# Patient Record
Sex: Female | Born: 1935 | Race: White | Hispanic: No | Smoking: Never smoker
Health system: Southern US, Community
[De-identification: ages and names within clinical notes are randomized; demographics above are authoritative.]

## PROBLEM LIST (undated history)

## (undated) DIAGNOSIS — G479 Sleep disorder, unspecified: Secondary | ICD-10-CM

## (undated) DIAGNOSIS — E785 Hyperlipidemia, unspecified: Secondary | ICD-10-CM

## (undated) DIAGNOSIS — F32A Depression, unspecified: Secondary | ICD-10-CM

## (undated) DIAGNOSIS — K219 Gastro-esophageal reflux disease without esophagitis: Secondary | ICD-10-CM

## (undated) DIAGNOSIS — M810 Age-related osteoporosis without current pathological fracture: Secondary | ICD-10-CM

## (undated) DIAGNOSIS — Z950 Presence of cardiac pacemaker: Secondary | ICD-10-CM

## (undated) DIAGNOSIS — M1990 Unspecified osteoarthritis, unspecified site: Secondary | ICD-10-CM

## (undated) DIAGNOSIS — A389 Scarlet fever, uncomplicated: Secondary | ICD-10-CM

## (undated) DIAGNOSIS — I10 Essential (primary) hypertension: Secondary | ICD-10-CM

## (undated) DIAGNOSIS — I4891 Unspecified atrial fibrillation: Secondary | ICD-10-CM

## (undated) DIAGNOSIS — I340 Nonrheumatic mitral (valve) insufficiency: Secondary | ICD-10-CM

## (undated) DIAGNOSIS — E039 Hypothyroidism, unspecified: Secondary | ICD-10-CM

## (undated) DIAGNOSIS — F329 Major depressive disorder, single episode, unspecified: Secondary | ICD-10-CM

## (undated) HISTORY — DX: Hypothyroidism, unspecified: E03.9

## (undated) HISTORY — DX: Scarlet fever, uncomplicated: A38.9

## (undated) HISTORY — DX: Sleep disorder, unspecified: G47.9

## (undated) HISTORY — DX: Unspecified atrial fibrillation: I48.91

## (undated) HISTORY — DX: Nonrheumatic mitral (valve) insufficiency: I34.0

## (undated) HISTORY — DX: Unspecified osteoarthritis, unspecified site: M19.90

## (undated) HISTORY — PX: CATARACT EXTRACTION, BILATERAL: SHX1313

## (undated) HISTORY — DX: Presence of cardiac pacemaker: Z95.0

## (undated) HISTORY — DX: Gastro-esophageal reflux disease without esophagitis: K21.9

## (undated) HISTORY — DX: Depression, unspecified: F32.A

## (undated) HISTORY — DX: Essential (primary) hypertension: I10

## (undated) HISTORY — DX: Major depressive disorder, single episode, unspecified: F32.9

## (undated) HISTORY — DX: Age-related osteoporosis without current pathological fracture: M81.0

## (undated) HISTORY — DX: Hyperlipidemia, unspecified: E78.5

---
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
## 1970-04-19 HISTORY — PX: VESICOVAGINAL FISTULA CLOSURE W/ TAH: SUR271

## 1970-04-19 HISTORY — PX: ABDOMINAL HYSTERECTOMY: SHX81

## 1974-04-19 DIAGNOSIS — C73 Malignant neoplasm of thyroid gland: Secondary | ICD-10-CM

## 1974-04-19 HISTORY — PX: OTHER SURGICAL HISTORY: SHX169

## 1974-04-19 HISTORY — DX: Malignant neoplasm of thyroid gland: C73

## 1981-04-19 HISTORY — PX: BREAST LUMPECTOMY: SHX2

## 2000-04-19 HISTORY — PX: BREAST BIOPSY: SHX20

## 2002-07-30 ENCOUNTER — Encounter: Payer: Self-pay | Admitting: Internal Medicine

## 2005-07-23 ENCOUNTER — Encounter: Payer: Self-pay | Admitting: Internal Medicine

## 2005-08-17 HISTORY — PX: PACEMAKER INSERTION: SHX728

## 2005-08-30 ENCOUNTER — Encounter: Payer: Self-pay | Admitting: Internal Medicine

## 2005-09-09 ENCOUNTER — Encounter: Payer: Self-pay | Admitting: Internal Medicine

## 2008-12-27 ENCOUNTER — Ambulatory Visit: Payer: Self-pay | Admitting: Internal Medicine

## 2008-12-27 DIAGNOSIS — R32 Unspecified urinary incontinence: Secondary | ICD-10-CM

## 2008-12-27 DIAGNOSIS — I10 Essential (primary) hypertension: Secondary | ICD-10-CM | POA: Insufficient documentation

## 2008-12-27 DIAGNOSIS — E039 Hypothyroidism, unspecified: Secondary | ICD-10-CM

## 2008-12-27 DIAGNOSIS — M1990 Unspecified osteoarthritis, unspecified site: Secondary | ICD-10-CM

## 2008-12-27 DIAGNOSIS — M159 Polyosteoarthritis, unspecified: Secondary | ICD-10-CM | POA: Insufficient documentation

## 2008-12-27 DIAGNOSIS — E785 Hyperlipidemia, unspecified: Secondary | ICD-10-CM

## 2008-12-27 DIAGNOSIS — G479 Sleep disorder, unspecified: Secondary | ICD-10-CM

## 2008-12-30 LAB — CONVERTED CEMR LAB
BUN: 26 mg/dL — ABNORMAL HIGH (ref 6–23)
Bilirubin, Direct: 0 mg/dL (ref 0.0–0.3)
CO2: 32 meq/L (ref 19–32)
Calcium: 9.3 mg/dL (ref 8.4–10.5)
Chloride: 101 meq/L (ref 96–112)
Direct LDL: 85.1 mg/dL
Eosinophils Relative: 3.8 % (ref 0.0–5.0)
HCT: 35.1 % — ABNORMAL LOW (ref 36.0–46.0)
HDL: 62.6 mg/dL (ref >39.00)
Lymphs Abs: 1.7 10*3/uL (ref 0.7–4.0)
MCV: 89.3 fL (ref 78.0–100.0)
Monocytes Absolute: 0.7 10*3/uL (ref 0.1–1.0)
Platelets: 230 10*3/uL (ref 150.0–400.0)
Potassium: 3.9 meq/L (ref 3.5–5.1)
RDW: 13.4 % (ref 11.5–14.6)
Total Bilirubin: 0.8 mg/dL (ref 0.3–1.2)
Total CHOL/HDL Ratio: 4
VLDL: 49 mg/dL — ABNORMAL HIGH (ref 0.0–40.0)
WBC: 5.8 10*3/uL (ref 4.5–10.5)

## 2009-01-24 ENCOUNTER — Ambulatory Visit: Payer: Self-pay | Admitting: Internal Medicine

## 2009-02-01 ENCOUNTER — Telehealth: Payer: Self-pay | Admitting: Family Medicine

## 2009-02-01 ENCOUNTER — Ambulatory Visit: Payer: Self-pay | Admitting: Family Medicine

## 2009-02-01 LAB — CONVERTED CEMR LAB
Bilirubin Urine: NEGATIVE
Urobilinogen, UA: 0.2

## 2009-02-07 ENCOUNTER — Ambulatory Visit: Payer: Self-pay | Admitting: Internal Medicine

## 2009-02-07 LAB — CONVERTED CEMR LAB
INR: 1.4
Prothrombin Time: 14.5 s

## 2009-02-10 LAB — CONVERTED CEMR LAB: TSH: 0.64 microintl units/mL (ref 0.35–5.50)

## 2009-02-17 ENCOUNTER — Telehealth (INDEPENDENT_AMBULATORY_CARE_PROVIDER_SITE_OTHER): Payer: Self-pay | Admitting: *Deleted

## 2009-02-17 ENCOUNTER — Ambulatory Visit: Payer: Self-pay | Admitting: Internal Medicine

## 2009-02-17 LAB — CONVERTED CEMR LAB
Bilirubin Urine: NEGATIVE
INR: 1.8
Ketones, urine, test strip: NEGATIVE
Prothrombin Time: 16.4 s
Specific Gravity, Urine: 1.015
Urobilinogen, UA: 0.2
pH: 5

## 2009-02-19 ENCOUNTER — Encounter: Payer: Self-pay | Admitting: Internal Medicine

## 2009-02-25 ENCOUNTER — Telehealth: Payer: Self-pay | Admitting: Internal Medicine

## 2009-03-19 ENCOUNTER — Telehealth: Payer: Self-pay | Admitting: Internal Medicine

## 2009-03-19 ENCOUNTER — Ambulatory Visit: Payer: Self-pay | Admitting: Internal Medicine

## 2009-04-15 ENCOUNTER — Encounter: Payer: Self-pay | Admitting: Internal Medicine

## 2009-04-16 ENCOUNTER — Ambulatory Visit: Payer: Self-pay | Admitting: Internal Medicine

## 2009-04-16 LAB — CONVERTED CEMR LAB
INR: 1.6
Prothrombin Time: 15.3 s

## 2009-04-23 ENCOUNTER — Ambulatory Visit: Payer: Self-pay | Admitting: Internal Medicine

## 2009-04-23 LAB — CONVERTED CEMR LAB: INR: 1.7

## 2009-04-25 ENCOUNTER — Ambulatory Visit: Payer: Self-pay | Admitting: Internal Medicine

## 2009-04-25 ENCOUNTER — Telehealth: Payer: Self-pay | Admitting: Internal Medicine

## 2009-04-25 DIAGNOSIS — H9190 Unspecified hearing loss, unspecified ear: Secondary | ICD-10-CM | POA: Insufficient documentation

## 2009-05-01 ENCOUNTER — Ambulatory Visit: Payer: Self-pay | Admitting: Internal Medicine

## 2009-05-01 LAB — CONVERTED CEMR LAB: INR: 3.2

## 2009-05-05 ENCOUNTER — Ambulatory Visit: Payer: Self-pay | Admitting: Internal Medicine

## 2009-05-05 DIAGNOSIS — F39 Unspecified mood [affective] disorder: Secondary | ICD-10-CM

## 2009-05-06 ENCOUNTER — Telehealth: Payer: Self-pay | Admitting: Internal Medicine

## 2009-05-07 ENCOUNTER — Encounter: Payer: Self-pay | Admitting: Internal Medicine

## 2009-05-15 ENCOUNTER — Ambulatory Visit: Payer: Self-pay | Admitting: Internal Medicine

## 2009-05-15 LAB — CONVERTED CEMR LAB
INR: 2.1
Prothrombin Time: 17.8 s

## 2009-05-28 ENCOUNTER — Ambulatory Visit: Payer: Self-pay | Admitting: Internal Medicine

## 2009-05-28 LAB — CONVERTED CEMR LAB: INR: 1.8

## 2009-06-25 ENCOUNTER — Ambulatory Visit: Payer: Self-pay | Admitting: Internal Medicine

## 2009-06-25 LAB — CONVERTED CEMR LAB: INR: 2.1

## 2009-07-23 ENCOUNTER — Ambulatory Visit: Payer: Self-pay | Admitting: Internal Medicine

## 2009-07-23 DIAGNOSIS — L570 Actinic keratosis: Secondary | ICD-10-CM | POA: Insufficient documentation

## 2009-07-24 LAB — CONVERTED CEMR LAB
ALT: 12 units/L (ref 0–35)
Albumin: 3.8 g/dL (ref 3.5–5.2)
Basophils Relative: 1.5 % (ref 0.0–3.0)
Bilirubin, Direct: 0 mg/dL (ref 0.0–0.3)
CO2: 33 meq/L — ABNORMAL HIGH (ref 19–32)
Calcium: 9 mg/dL (ref 8.4–10.5)
Creatinine, Ser: 1.1 mg/dL (ref 0.4–1.2)
Eosinophils Absolute: 0.3 10*3/uL (ref 0.0–0.7)
Glucose, Bld: 74 mg/dL (ref 70–99)
HCT: 34.1 % — ABNORMAL LOW (ref 36.0–46.0)
Lymphs Abs: 1.7 10*3/uL (ref 0.7–4.0)
MCHC: 34.4 g/dL (ref 30.0–36.0)
MCV: 89.1 fL (ref 78.0–100.0)
Monocytes Absolute: 0.7 10*3/uL (ref 0.1–1.0)
Neutrophils Relative %: 53.1 % (ref 43.0–77.0)
Potassium: 3.8 meq/L (ref 3.5–5.1)
RBC: 3.83 M/uL — ABNORMAL LOW (ref 3.87–5.11)
TSH: 5.57 microintl units/mL — ABNORMAL HIGH (ref 0.35–5.50)
Total Protein: 7.5 g/dL (ref 6.0–8.3)

## 2009-08-20 ENCOUNTER — Ambulatory Visit: Payer: Self-pay | Admitting: Internal Medicine

## 2009-08-20 LAB — CONVERTED CEMR LAB
INR: 2.1
Prothrombin Time: 25.3 s

## 2009-09-17 ENCOUNTER — Ambulatory Visit: Payer: Self-pay | Admitting: Internal Medicine

## 2009-09-17 LAB — CONVERTED CEMR LAB: Prothrombin Time: 31.5 s

## 2009-10-15 ENCOUNTER — Ambulatory Visit: Payer: Self-pay | Admitting: Internal Medicine

## 2009-10-15 LAB — CONVERTED CEMR LAB: INR: 3

## 2009-11-12 ENCOUNTER — Ambulatory Visit: Payer: Self-pay | Admitting: Internal Medicine

## 2009-11-26 ENCOUNTER — Ambulatory Visit: Payer: Self-pay | Admitting: Internal Medicine

## 2009-12-08 ENCOUNTER — Ambulatory Visit: Payer: Self-pay | Admitting: Cardiovascular Disease

## 2009-12-08 DIAGNOSIS — I059 Rheumatic mitral valve disease, unspecified: Secondary | ICD-10-CM | POA: Insufficient documentation

## 2009-12-09 ENCOUNTER — Encounter: Payer: Self-pay | Admitting: Cardiovascular Disease

## 2009-12-10 ENCOUNTER — Encounter: Payer: Self-pay | Admitting: Cardiovascular Disease

## 2009-12-10 ENCOUNTER — Ambulatory Visit: Payer: Self-pay | Admitting: Internal Medicine

## 2009-12-10 LAB — CONVERTED CEMR LAB
INR: 2.8
Prothrombin Time: 33.4 s

## 2009-12-15 ENCOUNTER — Ambulatory Visit: Payer: Self-pay | Admitting: Internal Medicine

## 2009-12-15 DIAGNOSIS — Z950 Presence of cardiac pacemaker: Secondary | ICD-10-CM

## 2009-12-15 DIAGNOSIS — I495 Sick sinus syndrome: Secondary | ICD-10-CM

## 2009-12-16 LAB — CONVERTED CEMR LAB
CO2: 31 meq/L (ref 19–32)
Chloride: 100 meq/L (ref 96–112)
Potassium: 4.1 meq/L (ref 3.5–5.3)
Sodium: 140 meq/L (ref 135–145)
TSH: 5.347 microintl units/mL — ABNORMAL HIGH (ref 0.350–4.500)

## 2009-12-19 ENCOUNTER — Telehealth: Payer: Self-pay | Admitting: Cardiovascular Disease

## 2010-01-19 ENCOUNTER — Telehealth: Payer: Self-pay | Admitting: Internal Medicine

## 2010-01-21 ENCOUNTER — Ambulatory Visit: Payer: Self-pay | Admitting: Internal Medicine

## 2010-01-21 LAB — CONVERTED CEMR LAB
Bilirubin Urine: NEGATIVE
Glucose, Urine, Semiquant: NEGATIVE
INR: 2.3
Ketones, urine, test strip: NEGATIVE
Prothrombin Time: 27.3 s
Specific Gravity, Urine: 1.02
Urobilinogen, UA: 0.2

## 2010-01-28 ENCOUNTER — Ambulatory Visit: Payer: Self-pay | Admitting: Internal Medicine

## 2010-01-28 LAB — CONVERTED CEMR LAB: INR: 2.9

## 2010-02-09 ENCOUNTER — Emergency Department (HOSPITAL_COMMUNITY): Admission: EM | Admit: 2010-02-09 | Discharge: 2010-02-09 | Payer: Self-pay | Admitting: Family Medicine

## 2010-02-09 ENCOUNTER — Encounter: Payer: Self-pay | Admitting: Internal Medicine

## 2010-02-10 ENCOUNTER — Telehealth: Payer: Self-pay | Admitting: Internal Medicine

## 2010-02-10 ENCOUNTER — Ambulatory Visit: Payer: Self-pay | Admitting: Internal Medicine

## 2010-02-10 LAB — CONVERTED CEMR LAB
Bilirubin Urine: NEGATIVE
Glucose, Urine, Semiquant: NEGATIVE
Protein, U semiquant: NEGATIVE
pH: 6

## 2010-02-16 ENCOUNTER — Telehealth: Payer: Self-pay | Admitting: Internal Medicine

## 2010-02-19 ENCOUNTER — Ambulatory Visit: Payer: Self-pay | Admitting: Internal Medicine

## 2010-03-19 ENCOUNTER — Ambulatory Visit: Payer: Self-pay | Admitting: Internal Medicine

## 2010-03-25 ENCOUNTER — Ambulatory Visit: Payer: Self-pay | Admitting: Internal Medicine

## 2010-03-25 LAB — CONVERTED CEMR LAB
INR: 3.6
Prothrombin Time: 42.7 s

## 2010-04-04 ENCOUNTER — Emergency Department (HOSPITAL_COMMUNITY)
Admission: EM | Admit: 2010-04-04 | Discharge: 2010-04-04 | Payer: Self-pay | Source: Home / Self Care | Admitting: Family Medicine

## 2010-04-08 ENCOUNTER — Ambulatory Visit: Payer: Self-pay | Admitting: Internal Medicine

## 2010-04-22 ENCOUNTER — Ambulatory Visit
Admission: RE | Admit: 2010-04-22 | Discharge: 2010-04-22 | Payer: Self-pay | Source: Home / Self Care | Attending: Internal Medicine | Admitting: Internal Medicine

## 2010-04-29 ENCOUNTER — Encounter (INDEPENDENT_AMBULATORY_CARE_PROVIDER_SITE_OTHER): Payer: Self-pay | Admitting: *Deleted

## 2010-05-19 NOTE — Assessment & Plan Note (Signed)
Summary: UTI/DS   Vital Signs:  Patient profile:   77 year old female Height:      63 inches Weight:      133 pounds BMI:     23.65 Temp:     98.2 degrees F oral Pulse rate:   72 / minute Pulse rhythm:   regular BP sitting:   128 / 62  (left arm) Cuff size:   regular  Vitals Entered By: Edwin Dada CMA Deborra Medina) (January 21, 2010 12:39 PM) CC: ? uti   History of Present Illness: Having burniing dysuria Only relief is when it finally ends void started again shortly after ending 3 day Rx for bladder  has used 2 different 3 day regimens Helps but has recurred No fever  Allergies: 1)  ! Sulfa  Review of Systems       mild nausea with the illness Some loose stools with the urine infection appetite is fair  Physical Exam  General:  alert and normal appearance.   Abdomen:  soft and non-tender.   Msk:  No CVA tenderness   Impression & Recommendations:  Problem # 1:  ACUTE CYSTITIS (ICD-595.0) Assessment Comment Only  has had recurrence since trying only the 3 day course Had been given a Rx by urologist for daily prophylaxis in past but she never took it  will treat for 10 days if freq recurrences, consider prophylaxis  Her updated medication list for this problem includes:    Ciprofloxacin Hcl 250 Mg Tabs (Ciprofloxacin hcl) .Marland Kitchen... 1 tab by mouth two times a day for bladder infection  Complete Medication List: 1)  Hydrochlorothiazide 25 Mg Tabs (Hydrochlorothiazide) .... Take 1 by mouth once daily 2)  Sotalol Hcl 120 Mg Tabs (Sotalol hcl) .... Take 1 by mouth two times a day 3)  Lisinopril 40 Mg Tabs (Lisinopril) .... Take 1 by mouth once daily 4)  Levothyroxine Sodium 50 Mcg Tabs (Levothyroxine sodium) .... Take 1 by mouth once daily 5)  Warfarin Sodium 4 Mg Tabs (Warfarin sodium) .Marland Kitchen.. 1-2 daily as  directed 6)  Mirtazapine 15 Mg Tabs (Mirtazapine) .Marland Kitchen.. 1 tab at bedtime 7)  Nitroglycerin 0.4 Mg Subl (Nitroglycerin) .... As needed 8)  Tylenol Extra Strength  500 Mg Tabs (Acetaminophen) .... As needed 9)  Ciprofloxacin Hcl 250 Mg Tabs (Ciprofloxacin hcl) .Marland Kitchen.. 1 tab by mouth two times a day for bladder infection  Patient Instructions: 1)  Please set up repeat protime in 1 week (since on antibioitic) 2)  Please keep scheduled visit in February Prescriptions: CIPROFLOXACIN HCL 250 MG TABS (CIPROFLOXACIN HCL) 1 tab by mouth two times a day for bladder infection  #20 x 1   Entered and Authorized by:   Claris Gower MD   Signed by:   Claris Gower MD on 01/21/2010   Method used:   [facility] to        [facility] [facility]       [address]       [place], [place]  [address]       Ph: [phone] or [phone]       Fax: [phone]   RxID:   [id number]   Current Allergies (reviewed today): ! SULFA  Laboratory Results   Urine Tests  Date/Time Received: January 21, 2010 12:59 PM Date/Time Reported: January 21, 2010 12:59 PM  Routine Urinalysis   Color: yellow Appearance: Clear Glucose: negative   (Normal Range: Negative) Bilirubin: negative   (Normal Range: Negative) Ketone:  negative   (Normal Range: Negative) Spec. Gravity: 1.020   (Normal Range: 1.003-1.035) Blood: small   (Normal Range: Negative) pH: 6.0   (Normal Range: 5.0-8.0) Protein: negative   (Normal Range: Negative) Urobilinogen: 0.2   (Normal Range: 0-1) Nitrite: negative   (Normal Range: Negative) Leukocyte Esterace: trace   (Normal Range: Negative)  Urine Microscopic WBC/HPF: 0-3 RBC/HPF: rare Bacteria/HPF: rare Epithelial/HPF: occ Crystals/HPF: occ       Appended Document: UTI/DS    Clinical Lists Changes  Orders: Added new Service order of Flu Vaccine 65yrs + MEDICARE PATIENTS [id number]) - Signed Added new Service order of Administration Flu vaccine - MCR [id number]) - Signed Observations: Added new observation of FLU VAX VIS: 11/11/2009 version (01/21/2010 13:17) Added new observation of FLU VAXLOT: [id number]  (01/21/2010 13:17) Added new observation of FLU VAXMFR: Glaxosmithkline (01/21/2010 13:17) Added new observation of FLU VAX EXP: 10/17/2010 (01/21/2010 13:17) Added new observation of FLU VAX DSE: 0.10ml (01/21/2010 13:17) Added new observation of FLU VAX: Fluvax 3+ (01/21/2010 13:17)    Flu Vaccine Consent Questions     Do you have a history of severe allergic reactions to this vaccine? no    Any prior history of allergic reactions to egg and/or gelatin? no    Do you have a sensitivity to the preservative Thimersol? no    Do you have a past history of Guillan-Barre Syndrome? no    Do you currently have an acute febrile illness? no    Have you ever had a severe reaction to latex? no    Vaccine information given and explained to patient? yes    Are you currently pregnant? no    Lot Number:[id number]   Exp Date:10/17/2010   Site Given  Left Deltoid IM    .lbmedflu

## 2010-05-19 NOTE — Progress Notes (Signed)
Summary: not any better  Phone Note Call from Patient   Caller: Daughter  [name]  [phone] Summary of Call: Pt was given z-pack last week for URI.   She is not any better- still having shaking, head congestion, swelling in righ eye, productive cough with white mucous.  Low grade fever if any.  Daughter is asking if a different abx can be called to [place].  Please let daughter know. Initial call taken by: Marty Heck CMA, [place],  February 16, 2010 8:46 AM  Follow-up for Phone Call        please send Rx for levaquin 250mg  daily #10 x 0 Have her check protime on thursday or Friday needs to be seen by then if not improved Follow-up by: Claris Gower MD,  February 16, 2010 10:18 AM  Additional Follow-up for Phone Call Additional follow up Details #1::        see other note will increase dose Additional Follow-up by: Claris Gower MD,  February 16, 2010 1:40 PM    New/Updated Medications: LEVOFLOXACIN 500 MG TABS (LEVOFLOXACIN) 1 tab daily for respiratory infection Prescriptions: LEVOFLOXACIN 500 MG TABS (LEVOFLOXACIN) 1 tab daily for respiratory infection  #10 x 0   Entered and Authorized by:   Claris Gower MD   Signed by:   Claris Gower MD on 02/16/2010   Method used:   [facility]        [facility] [facility]       [address]       [place], [place]  [address]       Ph: [phone] or [phone]       Fax: [phone]   RxID:   [id number]

## 2010-05-19 NOTE — Assessment & Plan Note (Signed)
Summary: F/U [facility] 02/09/10/[facility]   Vital Signs:  Patient profile:   77 year old female Weight:      133 pounds O2 Sat:      93 % on Room air Temp:     98.5 degrees F oral Pulse rate:   84 / minute Pulse rhythm:   regular Resp:     18 per minute BP sitting:   100 / 58  (left arm) Cuff size:   regular  Vitals Entered By: Edwin Dada CMA Deborra Medina) (February 10, 2010 12:53 PM)  O2 Flow:  Room air CC: follow-up visit from [facility]   History of Present Illness: Started with resp symptoms 5 days ago (the 20th) had shaking and chills that night some nausea Evaluated by RN at [facility] who felt she may have sinus infection z-pak started on 10/21 did have sinus tenderness did have temp to 100.5  Yesterday  ~5:30PM, had shaking and chills again RN evaluated her then, daughter came over Then spoke to our Call a nurse did go to [facility] urgent care  CXR was fine No Rx changes then  Still with mild facial pain some cough and drainage slight phlegm No SOB  Allergies: 1)  ! Sulfa  Past History:  Past medical, surgical, family and social histories (including risk factors) reviewed for relevance to current acute and chronic problems.  Past Medical History: Reviewed history from 12/08/2009 and no changes required. Atrial fibrillation Hyperlipidemia Hypertension Hypothyroidism--after Rx for thyroid cancer Osteoarthritis Sleep disturbance Urinary incontinence--usually from UTIs Mitral regurgitation Depression pacemaker  Past Surgical History: Reviewed history from 12/27/2008 and no changes required. Tonsils and adenoids 1937 Hysterectomy 1972 Goiter removed--cancerous. Then got RAI  1976 Breast  Lumpectomy-1983 Breast biopsy-- 2002 Atrial fib--3/07 Pacemaker inserted 5/07 (after syncope) Cataracts bilat  Family History: Reviewed history from 12/27/2008 and no changes required. Dad died after broken hip @86  Mom died of old age at 28 5  siblings HTN, DM in mom, brother sister with stroke Brother wtih CAD (CABG) 1 brother with prostate cancer No breast or colon cancer  Social History: Reviewed history from 12/27/2008 and no changes required. Widowed 2002 2 daughters--1 local, other in [place] Was homemaker--farmed/garden Never Smoked Alcohol use-no  Has living will Daughter [name]) is health care POA Discussed DNR and she requests Would not want tube feeds  Review of Systems       slight loose stools appetite is down  Physical Exam  General:  alert.  NAD Head:  no maxillary or sinus tenderness Nose:  mild pale swelling without inflammation Mouth:  no erythema and no exudates.   Neck:  supple, no masses, and no cervical lymphadenopathy.   Lungs:  normal respiratory effort, no intercostal retractions, no accessory muscle use, and normal breath sounds.     Impression & Recommendations:  Problem # 1:  FEVER (ICD-780.60) Assessment New  had chill again last night urine okay still mild sinus symptoms but just finishing z-pak  P: will change to amoxil or augmentin if sinus symptoms or chills return  Orders: UA Dipstick w/o Micro (manual) (81002)  Complete Medication List: 1)  Mirtazapine 15 Mg Tabs (Mirtazapine) .Marland Kitchen.. 1 tab at bedtime 2)  Warfarin Sodium 4 Mg Tabs (Warfarin sodium) .Marland Kitchen.. 1-2 daily as  directed 3)  Hydrochlorothiazide 25 Mg Tabs (Hydrochlorothiazide) .... Take 1 by mouth once daily 4)  Sotalol Hcl 120 Mg Tabs (Sotalol hcl) .... Take 1 by mouth two times a day 5)  Lisinopril  40 Mg Tabs (Lisinopril) .... Take 1 by mouth once daily 6)  Levothyroxine Sodium 50 Mcg Tabs (Levothyroxine sodium) .... Take 1 by mouth once daily 7)  Nitroglycerin 0.4 Mg Subl (Nitroglycerin) .... As needed 8)  Tylenol Extra Strength 500 Mg Tabs (Acetaminophen) .... As needed  Patient Instructions: 1)  Keep regular follow up appt   Orders Added: 1)  Est. Patient Level III [id number] 2)  UA Dipstick  w/o Micro (manual) [81002]    Current Allergies (reviewed today): ! SULFA  Laboratory Results   Urine Tests  Date/Time Received: February 10, 2010 1:04 PM Date/Time Reported: February 10, 2010 1:04 PM  Routine Urinalysis   Appearance: Hazy Glucose: negative   (Normal Range: Negative) Bilirubin: negative   (Normal Range: Negative) Ketone: negative   (Normal Range: Negative) Spec. Gravity: 1.015   (Normal Range: 1.003-1.035) Blood: small   (Normal Range: Negative) pH: 6.0   (Normal Range: 5.0-8.0) Protein: negative   (Normal Range: Negative) Urobilinogen: 0.2   (Normal Range: 0-1) Nitrite: negative   (Normal Range: Negative) Leukocyte Esterace: trace   (Normal Range: Negative)

## 2010-05-19 NOTE — Progress Notes (Signed)
  Phone Note Outgoing Call   Call placed by: Claris Gower MD,  May 06, 2009 8:32 AM Summary of Call: spoke to daughter had packing with sponge done by Dr Pryor Ochoa will have her take just 4mg  of warfarin daily till bleeding is controlled Initial call taken by: Claris Gower MD,  May 06, 2009 8:33 AM

## 2010-05-19 NOTE — Assessment & Plan Note (Signed)
Summary: 6 WK F/U [facility]   Vital Signs:  Patient profile:   77 year old female Weight:      129 pounds Temp:     98.1 degrees F oral Pulse rate:   80 / minute Pulse rhythm:   regular BP sitting:   128 / 70  (left arm) Cuff size:   regular  Vitals Entered By: Edwin Dada CMA Deborra Medina) (July 23, 2009 10:35 AM) CC: 6 week follow-up   History of Present Illness: Here with daughter  Feels better with the mirtazapine Mood is better appetite has improved sleeps well  Now more sedentary  Not much activity now  Ongoing arthritis problems Not too bad---mostly in hands and some in shoulders generally doesn't use tylenol anymore  No heart trouble Occ notes some palpitations--esp when bathing, etc at night No chest pain No SOB  Irritative skin areas on upper chest  Allergies: 1)  ! Sulfa  Past History:  Past medical, surgical, family and social histories (including risk factors) reviewed for relevance to current acute and chronic problems.  Past Medical History: Reviewed history from 05/05/2009 and no changes required. Atrial fibrillation Hyperlipidemia Hypertension Hypothyroidism--after Rx for thyroid cancer Osteoarthritis Sleep disturbance Urinary incontinence--usually from UTIs Mitral regurgitation Depression  Past Surgical History: Reviewed history from 12/27/2008 and no changes required. Tonsils and adenoids 1937 Hysterectomy 1972 Goiter removed--cancerous. Then got RAI  1976 Breast  Lumpectomy-1983 Breast biopsy-- 2002 Atrial fib--3/07 Pacemaker inserted 5/07 (after syncope) Cataracts bilat  Family History: Reviewed history from 12/27/2008 and no changes required. Dad died after broken hip @86  Mom died of old age at 63 5 siblings HTN, DM in mom, brother sister with stroke Brother wtih CAD (CABG) 1 brother with prostate cancer No breast or colon cancer  Social History: Reviewed history from 12/27/2008 and no changes required. 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   Neck:  supple, no masses, no thyromegaly, no cervical lymphadenopathy, and no neck tenderness.   Lungs:  normal respiratory effort and normal breath sounds.   Heart:  normal rate, regular rhythm, no murmur, and no gallop.  Apparent paced rhythm Abdomen:  soft and non-tender.   Msk:  no joint tenderness.  Nodules and thickening in hands without active synovitis Extremities:  no sig edema Skin:  3 actinics on upper chest Psych:  normally interactive, good eye contact, not anxious appearing, and not depressed appearing.     Impression & Recommendations:  Problem # 1:  ATRIAL FIBRILLATION (ICD-427.31) Assessment Comment Only paced  rhthym on coumadin occ palpitations but sounds benign  Her updated medication list for this problem includes:    Sotalol Hcl 120 Mg Tabs (Sotalol hcl) .Marland Kitchen... Take 1 by mouth two times a day    Warfarin Sodium 4 Mg Tabs (Warfarin sodium) .Marland Kitchen... 1-2 daily as  directed  Problem # 2:  HYPERTENSION (ICD-401.9) Assessment: Unchanged  good control no changes needed check labs  Her updated medication list for this problem includes:    Hydrochlorothiazide 25 Mg Tabs (Hydrochlorothiazide) .Marland Kitchen... Take 1 by mouth once daily    Sotalol Hcl 120  Mg Tabs (Sotalol hcl) .Marland Kitchen... Take 1 by mouth two times a day    Lisinopril 40 Mg Tabs (Lisinopril) .Marland Kitchen... Take 1 by mouth once daily  BP today: 128/70 Prior BP: 118/60 (05/05/2009)  Labs Reviewed: K+: 3.9 (12/27/2008) Creat: : 1.1 (12/27/2008)   Chol: 236 (12/27/2008)   HDL: 62.60 (12/27/2008)   TG: 245.0  (12/27/2008)  Orders: TLB-Renal Function Panel (80069-RENAL) TLB-CBC Platelet - w/Differential (85025-CBCD) TLB-Hepatic/Liver Function Pnl (80076-HEPATIC) TLB-TSH (Thyroid Stimulating Hormone) (84443-TSH)  Problem # 3:  DEPRESSION (ICD-311) Assessment: Improved will continue the mirtazapine for mood and sleep  Her updated medication list for this problem includes:    Mirtazapine 15 Mg Tabs (Mirtazapine) .Marland Kitchen... 1 tab at bedtime  Problem # 4:  HYPOTHYROIDISM (ICD-244.9) Assessment: Unchanged  will recheck labs  Her updated medication list for this problem includes:    Levothyroxine Sodium 50 Mcg Tabs (Levothyroxine sodium) .Marland Kitchen... Take 1 by mouth once daily  Labs Reviewed: TSH: 0.64 (02/07/2009)    Chol: 236 (12/27/2008)   HDL: 62.60 (12/27/2008)   TG: 245.0 (12/27/2008)  Orders: TLB-T4 (Thyrox), Free [id number]) Venipuncture [id number])  Problem # 5:  SLEEP DISORDER (ICD-780.50) Assessment: Improved better on mirtazapine  Problem # 6:  ACTINIC KERATOSIS (ICD-702.0) Assessment: New all lesions treated for 40 seconds x 2 Orders: Cryotherapy/Destruction benign or premalignant lesion (1st lesion)  (17000) Cryotherapy/Destruction benign or premalignant lesion (2nd-14th lesions) (17003)  Complete Medication List: 1)  Hydrochlorothiazide 25 Mg Tabs (Hydrochlorothiazide) .... Take 1 by mouth once daily 2)  Nitroglycerin 0.4 Mg Subl (Nitroglycerin) .... As needed 3)  Sotalol Hcl 120 Mg Tabs (Sotalol hcl) .... Take 1 by mouth two times a day 4)  Lisinopril 40 Mg Tabs (Lisinopril) .... Take 1 by mouth once daily 5)  Levothyroxine Sodium 50 Mcg Tabs (Levothyroxine sodium) .... Take 1 by mouth once daily 6)  Warfarin Sodium 4 Mg Tabs (Warfarin sodium) .Marland Kitchen.. 1-2 daily as  directed 7)  Mirtazapine 15 Mg Tabs (Mirtazapine) .Marland Kitchen.. 1 tab at bedtime  Patient Instructions: 1)  Please schedule a follow-up appointment in 4 months .   Current Allergies (reviewed today): ! SULFA

## 2010-05-19 NOTE — Consult Note (Signed)
Summary: [facility]  [facility]   Imported By: Edmonia James 06/13/2009 09:14:18  _____________________________________________________________________  External Attachment:    Type:   Image     Comment:   External Document  Appended Document: [facility] bleeding spot cauterized with silver nitrate follow up as needed

## 2010-05-19 NOTE — Procedures (Signed)
Summary: 9:15 APPT/AMD   Visit Type:  Initial Consult Referring Provider:  Brooke [name]. Primary Provider:  Claris Gower MD  CC:  Est. Care for pacemaker checks.Marland Kitchen  History of Present Illness: [name] is a  77 year old woman seen at the request of Dr Amanda [name] with a previiously implanted pacemaker2007 for syncope and   apparently tachybradycardia syndrome  Her history of paroxysmal atrial fibrillation managed with sotalol.  she has had some weight gain since moving from [place] about 12 months ago. She has mild exercise intolerance but denies chest pain. She has chronic peripheral edema   normal systolic function and moderate to severe MR, mild aortic valve regurgitation, mildly dilated left atrium by echocardiogram in 2007    Holter monitor   April 2007 showed frequent and complex supraventricular arrhythmia, Cardiac catheterization in April 2007 showed  no significant coronary artery disease.cardiac             Current Medications (verified): 1)  Hydrochlorothiazide 25 Mg Tabs (Hydrochlorothiazide) .... Take 1 By Mouth Once Daily 2)  Sotalol Hcl 120 Mg Tabs (Sotalol Hcl) .... Take 1 By Mouth Two Times A Day 3)  Lisinopril 40 Mg Tabs (Lisinopril) .... Take 1 By Mouth Once Daily 4)  Levothyroxine Sodium 50 Mcg Tabs (Levothyroxine Sodium) .... Take 1 By Mouth Once Daily 5)  Warfarin Sodium 4 Mg Tabs (Warfarin Sodium) .Marland Kitchen.. 1-2 Daily As  Directed 6)  Mirtazapine 15 Mg Tabs (Mirtazapine) .Marland Kitchen.. 1 Tab At Bedtime 7)  Nitroglycerin 0.4 Mg Subl (Nitroglycerin) .... As Needed 8)  Tylenol Extra Strength 500 Mg Tabs (Acetaminophen) .... As Needed  Allergies (verified): 1)  ! Sulfa  Past History:  Past Medical History: Last updated: 12/08/2009 Atrial fibrillation Hyperlipidemia Hypertension Hypothyroidism--after Rx for thyroid cancer Osteoarthritis Sleep disturbance Urinary incontinence--usually from UTIs Mitral regurgitation Depression pacemaker  Past Surgical  History: Last updated: 2009/01/18 Tonsils and adenoids 1937 Hysterectomy 1972 Goiter removed--cancerous. Then got RAI  1976 Breast  Lumpectomy-1983 Breast biopsy-- 2002 Atrial fib--3/07 Pacemaker inserted 5/07 (after syncope) Cataracts bilat  Family History: Last updated: 01/18/2009 Dad died after broken hip @86  Mom died of old age at 65 5 siblings HTN, DM in mom, brother sister with stroke Brother wtih CAD (CABG) 1 brother with prostate cancer No breast or colon cancer  Social History: Last updated: 01-18-09 Widowed 2002 2 daughters--1 local, other in [place] Was homemaker--farmed/garden Never Smoked Alcohol use-no  Has living will Daughter [name]) is health care POA Discussed DNR and she requests Would not want tube feeds  Risk Factors: Smoking Status: never (2009-01-18)  Review of Systems       full review of systems was negative apart from a history of present illness and past medical history.   Vital Signs:  Patient profile:   77 year old female Height:      63 inches Weight:      132 pounds BMI:     23.47 Pulse rate:   85 / minute BP sitting:   152 / 89  (left arm) Cuff size:   regular  Vitals Entered By: Dolores Lory, CMA (December 15, 2009 9:07 AM)  Physical Exam  General:  Well developed, well nourishedelderly Caucasian female appearing her stated age of 68, in no acute distress. Head:  normal HEENT upper from a hearing aid Neck:  supple without thyromegaly JVP 6-7 cm carotids brisk without bruits Chest Wall:  mild kyphosis without CVA tenderness Lungs:  clear to auscultate Heart:  regular rate and rhythm without significant murmurs  Abdomen:  soft with active bowel sounds no hepatomegaly or midline pulsation Msk:  normal gait and without obvious arthritis Pulses:  distal pulses intact Extremities:  no clubbing cyanosis or edema Neurologic:  alert and oriented with grossly normal motor and sensory function Skin:  warm and  dry Cervical Nodes:  without adenopathy Psych:  somewhat flat affect   PPM Specifications Following MD:  Virl Axe, MD     PPM Vendor:  [employer]     PPM Model Number:  [id number]     PPM Serial Number:  [id number] PPM DOI:  09/09/2005     PPM Implanting MD:  NOT IMPLANTED BY [place]  Lead 1    Location: RA     DOI: 09/09/2005     Model #: [id number]     Serial #: [id number]     Status: active Lead 2    Location: RV     DOI: 09/09/2005     Model #: [id number]     Serial #[id number]     Status: active  Magnet Response Rate:  BOL 85 ERI 65  Indications:  Syncope   PPM Follow Up Remote Check?  No Battery Voltage:  2.75 V     Battery Est. Longevity:  5.5 years     Pacer Dependent:  No       PPM Device Measurements Atrium  Amplitude: 2.0 mV, Impedance: 711 ohms, Threshold: 0.75 V at 0.4 msec Right Ventricle  Amplitude: 4.0 mV, Impedance: 499 ohms, Threshold: 0.625 V at 0.4 msec  Episodes MS Episodes:  0     Percent Mode Switch:  0     Coumadin:  Yes Ventricular High Rate:  0     Atrial Pacing:  97.8%     Ventricular Pacing:  0.8%  Parameters Next Remote Date:  03/19/2010     Next Cardiology Appt Due:  11/18/2010 Tech Comments:  No parameter changes.  Device function normal.   Carelink transmissions every 3 months.  ROV 1 year with Dr. Caryl Comes in [place]. Alma Friendly, LPN  August 29, [id number] 9:29 AM   Impression & Recommendations:  Problem # 1:  ATRIAL FIBRILLATION (ICD-427.31) She has paroxysmal atrial fibrillation. A cardiogram from last week was reviewed with a QTC of 440 ms. Blood work from April had a potassium of 3.8; no magnesium level was drawn. We'll repeat these today. Interrogation of her device demonstrates no intercurrent atrial fibrillation Her updated medication list for this problem includes:    Sotalol Hcl 120 Mg Tabs (Sotalol hcl) .Marland Kitchen... Take 1 by mouth two times a day    Warfarin Sodium 4 Mg Tabs (Warfarin sodium) .Marland Kitchen... 1-2 daily as  directed  Problem # 2:  HYPERTENSION  (ICD-401.9) blood pressure is mildly elevated today. This appears to be anomalous per her daughter. And to continue to follow without making any medication adjustments Her updated medication list for this problem includes:    Hydrochlorothiazide 25 Mg Tabs (Hydrochlorothiazide) .Marland Kitchen... Take 1 by mouth once daily    Sotalol Hcl 120 Mg Tabs (Sotalol hcl) .Marland Kitchen... Take 1 by mouth two times a day    Lisinopril 40 Mg Tabs (Lisinopril) .Marland Kitchen... Take 1 by mouth once daily  Problem # 3:  SICK SINUS/ TACHY-BRADY SYNDROME (ICD-427.81) patient is largely atrially paced. Her heart rate distribution was a little bit skewed and her  rate response was reprogrammed Her updated medication list for this problem includes:    Sotalol Hcl 120 Mg Tabs (Sotalol hcl) .Marland Kitchen... Take 1 by  mouth two times a day    Lisinopril 40 Mg Tabs (Lisinopril) .Marland Kitchen... Take 1 by mouth once daily    Warfarin Sodium 4 Mg Tabs (Warfarin sodium) .Marland Kitchen... 1-2 daily as  directed    Nitroglycerin 0.4 Mg Subl (Nitroglycerin) .Marland Kitchen... As needed  Problem # 4:  PACEMAKER, PERMANENT (ICD-V45.01)  Device parameters and data were reviewed and changes were made as above  Orders: T-Basic Metabolic Panel ([id number])  Problem # 5:  HYPOTHYROIDISM (ICD-244.9) her TSH in April was greater than 5.5. We will recheck it today. Her updated medication list for this problem includes:    Levothyroxine Sodium 50 Mcg Tabs (Levothyroxine sodium) .Marland Kitchen... Take 1 by mouth once daily  Other Orders: T-Magnesium [id number]) T-TSH [phone])  Patient Instructions: 1)  Your physician recommends that you continue on your current medications as directed. Please refer to the Current Medication list given to you today. 2)  Your physician wants you to follow-up in:   1 year You will receive a reminder letter in the mail two months in advance. If you don't receive a letter, please call our office to schedule the follow-up appointment. 3)  Your physician recommends that you have   lab work today: (Mag/TSH/BMET)

## 2010-05-19 NOTE — Progress Notes (Signed)
Summary: Call from [facility]  Phone Note From Other Clinic Call back at [phone]   Caller: [facility] Call For: Dr. Silvio Pate Summary of Call: Amanda [name] was called out to see this patient today. Patient is extremely weak, some shaking, unsteady gait when she walks, BP 88/42, standing 79/40. Patient is alert and responsive, but has been having ongoing periods of productive cough, large amount of sputum, dizziness and soreness in her chest today, O2 stat 87-90% on rest and room air, and she hears a rub in her left posterior based lung. Patient's right eye is red, crusting and matter in the corner of the eye. Patient has a poor appetite, may have a lung infection that has settled in.  Patient's family thinks that she may need an antibiotic or to be seen. Please advise. Initial call taken by: Emelia Salisbury LPN,  October 31, 624THL 1:17 PM  Follow-up for Phone Call        levaquin prescribed already spoke to daughter--she will get the first dose in ASAP  Amanda [name] will have her hold the lisinopril and HCTZ for now plan to see later this week--daughter will call  To ER tonight unless she is improving after the antibiotic  Claris Gower MD  February 16, 2010 1:44 PM  Follow-up by: Claris Gower MD,  February 16, 2010 1:44 PM  Additional Follow-up for Phone Call Additional follow up Details #1::        Dee,  Please call daughter and Amanda [name] to check on her in the morning Claris Gower MD  February 16, 2010 1:44 PM   [facility] called and stated pt lungs sound clear, BP better up to 118/60 O2 above 90%, pt is weak but better than yesterday, daughter is pushing fluids and pt started medication yesterday. Per Amanda Ro do you want to see pt today or later in the week? Please advise. [name] CMA Deborra Medina)  February 17, 2010 8:54 AM   should set up appt later this week with me or at clinic tomorrow with Dr Gwenevere Ghazi Horton Marshall MD  February 17, 2010 9:51 AM   Appt scheduled Thursday  02/19/10 @ 3:00pm Additional Follow-up by: Edwin Dada CMA ([place]),  February 17, 2010 10:04 AM

## 2010-05-19 NOTE — Consult Note (Signed)
Summary: [facility]  [facility]   Imported By: Laural Benes 05/19/2009 15:56:42  _____________________________________________________________________  External Attachment:    Type:   Image     Comment:   External Document  Appended Document: [facility] treated bleeding in left ear canal after my cerumen removal

## 2010-05-19 NOTE — Assessment & Plan Note (Signed)
Summary: 4MTH FOLLOW UP / LFW   Vital Signs:  Patient profile:   77 year old female Weight:      131.25 pounds BMI:     23.33 Temp:     98.3 degrees F oral Pulse rate:   80 / minute Pulse rhythm:   regular BP sitting:   124 / 78  (left arm) Cuff size:   regular  Vitals Entered By: Emelia Salisbury LPN (August 10, 624THL 10:55 AM) CC: 4 Month follow-up   History of Present Illness: Here with daughter as usual  has been feeling a little dizzy for the past 2-3 days No nausea Slight loose stools No fever No abd pain appetite is off some  Appetite has been okay  weight up a couple of pounds  Some trouble sleeping Mind goes --even after relaxing with reading Usually goes to sleep by 1:30 or 2 sleeps till 7:30AM Naps during the day easily--just goes for a few minutes She did stop the mirtazapine  Allergies: 1)  ! Sulfa  Past History:  Past medical, surgical, family and social histories (including risk factors) reviewed for relevance to current acute and chronic problems.  Past Medical History: Reviewed history from 05/05/2009 and no changes required. Atrial fibrillation Hyperlipidemia Hypertension Hypothyroidism--after Rx for thyroid cancer Osteoarthritis Sleep disturbance Urinary incontinence--usually from UTIs Mitral regurgitation Depression  Past Surgical History: Reviewed history from 12/27/2008 and no changes required. Tonsils and adenoids 1937 Hysterectomy 1972 Goiter removed--cancerous. Then got RAI  1976 Breast  Lumpectomy-1983 Breast biopsy-- 2002 Atrial fib--3/07 Pacemaker inserted 5/07 (after syncope) Cataracts bilat  Family History: Reviewed history from 12/27/2008 and no changes required. Dad died after broken hip @86  Mom died of old age at 103 5 siblings HTN, DM in mom, brother sister with stroke Brother wtih CAD (CABG) 1 brother with prostate cancer No breast or colon cancer  Social History: Reviewed history from 12/27/2008 and no  changes required. 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         no mood problems Mild evening edema  Physical Exam  General:  alert and normal appearance.   Neck:  supple, no masses, no thyromegaly, no carotid bruits, and no cervical lymphadenopathy.   Lungs:  normal respiratory effort, no intercostal retractions, no accessory muscle use, and normal breath sounds.   Heart:  normal rate, regular rhythm, no murmur, and no gallop.   Abdomen:  soft, non-tender, and no masses.   Msk:  no joint tenderness and no joint swelling.   Pulses:  1+ in feet Extremities:  no edema Psych:  normally interactive, good eye contact, not anxious appearing, and not depressed appearing.     Impression & Recommendations:  Problem # 1:  GASTROENTERITIS (ICD-558.9) Assessment New mild should be self limited  Problem # 2:  DEPRESSION (ICD-311) Assessment: Improved stopped the mirtazapine having some sleep problems she will decide if she wants to restart  Her updated medication list for this problem includes:    Mirtazapine 15 Mg Tabs (Mirtazapine) .Marland Kitchen... 1 tab at bedtime  Problem # 3:  ATRIAL FIBRILLATION (ICD-427.31) Assessment: Unchanged  in sinus on meds wants to switch to local cardiology care  Her updated medication list for this problem includes:    Sotalol Hcl 120  Mg Tabs (Sotalol hcl) .Marland Kitchen... Take 1 by mouth two times a day    Warfarin Sodium 4 Mg Tabs (Warfarin sodium) .Marland Kitchen... 1-2 daily as  directed  Orders: Cardiology Referral (Cardiology)  Problem # 4:  HYPERTENSION (ICD-401.9) Assessment: Unchanged good control no changes needed  Her updated medication list for this problem includes:     Hydrochlorothiazide 25 Mg Tabs (Hydrochlorothiazide) .Marland Kitchen... Take 1 by mouth once daily    Sotalol Hcl 120 Mg Tabs (Sotalol hcl) .Marland Kitchen... Take 1 by mouth two times a day    Lisinopril 40 Mg Tabs (Lisinopril) .Marland Kitchen... Take 1 by mouth once daily  BP today: 124/78 Prior BP: 128/70 (07/23/2009)  Labs Reviewed: K+: 3.8 (07/23/2009) Creat: : 1.1 (07/23/2009)   Chol: 236 (12/27/2008)   HDL: 62.60 (12/27/2008)   TG: 245.0 (12/27/2008)  Problem # 5:  SLEEP DISORDER (ICD-780.50) Assessment: Deteriorated she will consider restarting the mirtazapine  Complete Medication List: 1)  Hydrochlorothiazide 25 Mg Tabs (Hydrochlorothiazide) .... Take 1 by mouth once daily 2)  Sotalol Hcl 120 Mg Tabs (Sotalol hcl) .... Take 1 by mouth two times a day 3)  Lisinopril 40 Mg Tabs (Lisinopril) .... Take 1 by mouth once daily 4)  Levothyroxine Sodium 50 Mcg Tabs (Levothyroxine sodium) .... Take 1 by mouth once daily 5)  Warfarin Sodium 4 Mg Tabs (Warfarin sodium) .Marland Kitchen.. 1-2 daily as  directed 6)  Mirtazapine 15 Mg Tabs (Mirtazapine) .Marland Kitchen.. 1 tab at bedtime 7)  Nitroglycerin 0.4 Mg Subl (Nitroglycerin) .... As needed  Patient Instructions: 1)  If you continue to have trouble sleeping, please restart the mirtazapine at bedtime 2)  Please schedule a follow-up appointment in 6 months .  3)  Referral Appointment Information 4)  Day/Date: 5)  Time: 6)  Place/MD: 7)  Address: 8)  Phone/Fax: 9)  Patient given appointment information. Information/Orders faxed/mailed.  Current Allergies (reviewed today): ! SULFA

## 2010-05-19 NOTE — Assessment & Plan Note (Signed)
Summary: NP6/AMD   Visit Type:  Initial Consult Referring Tarrah Furuta:  Brooke Pace. Primary Mckinzi Eriksen:  Claris Gower MD  CC:  Establish care with a cardiologist A-fib and pacermaker implanted 2007.  She moved to [facility]  Aug. 2010 to be closer to family. She lives at [facility]. She has her pacemaker check by phone with [facility]. Marland Kitchen  History of Present Illness: [name] is a pleasant 77 year old woman who presents to establish care. She has a history of paroxysmal atrial fibrillation, normal systolic function by echocardiogram in 2007 with moderate to severe MR, mild aortic valve regurgitation, mildly dilated left atrium, Holter monitor and April 2007 showing frequent and complex supraventricular arrhythmia, cardiac CTA showing no significant coronary artery disease with atherosclerotic plaque in the thoracic aorta who has been managed on sotalol for rhythm control, pacer placed in May 2007 with a history of "fainting "prior to that who presents to establish care.  She presents with her daughter. Overall, she states that she has been doing well. The car unaware if she has had any recent episodes of atrial fibrillation. She is relatively sedentary, does walk for short periods during the daytime but likes to read. She denies any shortness of breath, chest pain. Occasionally she has waxing and waning lower extremity edema. She has no complaints.  EKG shows normal sinus rhythm with rate 65 beats per minute, appears to be a sensed  With magnet, rate is 85 beats per minute, A- V. paced  Cardiac catheterization in April 2007 showing no significant coronary artery disease.  Current Medications (verified): 1)  Hydrochlorothiazide 25 Mg Tabs (Hydrochlorothiazide) .... Take 1 By Mouth Once Daily 2)  Sotalol Hcl 120 Mg Tabs (Sotalol Hcl) .... Take 1 By Mouth Two Times A Day 3)  Lisinopril 40 Mg Tabs (Lisinopril) .... Take 1 By Mouth Once Daily 4)  Levothyroxine Sodium 50 Mcg Tabs  (Levothyroxine Sodium) .... Take 1 By Mouth Once Daily 5)  Warfarin Sodium 4 Mg Tabs (Warfarin Sodium) .Marland Kitchen.. 1-2 Daily As  Directed 6)  Mirtazapine 15 Mg Tabs (Mirtazapine) .Marland Kitchen.. 1 Tab At Bedtime 7)  Nitroglycerin 0.4 Mg Subl (Nitroglycerin) .... As Needed 8)  Tylenol Extra Strength 500 Mg Tabs (Acetaminophen) .... As Needed  Allergies (verified): 1)  ! Sulfa  Past History:  Past Surgical History: Last updated: 01/01/2009 Tonsils and adenoids 1937 Hysterectomy 1972 Goiter removed--cancerous. 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 Smoked Alcohol use-no  Has living will Daughter [name]) is health care POA Discussed DNR and she requests Would not want tube feeds  Risk Factors: Smoking Status: never (2009-01-01)  Past Medical History: Atrial fibrillation Hyperlipidemia Hypertension Hypothyroidism--after Rx for thyroid cancer Osteoarthritis Sleep disturbance Urinary incontinence--usually from UTIs Mitral regurgitation Depression pacemaker  Review of Systems  The patient denies fever, weight loss, weight gain, vision loss, decreased hearing, hoarseness, chest pain, syncope, dyspnea on exertion, peripheral edema, prolonged cough, abdominal pain, incontinence, muscle weakness, depression, and enlarged lymph nodes.    Vital Signs:  Patient profile:   77 year old female Height:  63 inches Weight:      134 pounds BMI:     23.82 Pulse rate:   68 / minute BP sitting:   136 / 81  (left arm) Cuff  size:   regular  Vitals Entered By: Dolores Lory, CMA (December 08, 2009 9:39 AM)  Physical Exam  General:  Well developed, well nourished, in no acute distress. Head:  normocephalic and atraumatic Neck:  Neck supple, no JVD. No masses, thyromegaly or abnormal cervical nodes. Lungs:  Clear bilaterally to auscultation and percussion. Heart:  Non-displaced PMI, chest non-tender; regular rate and rhythm, S1, S2 without murmurs, rubs or gallops. Carotid upstroke normal, no bruit.  Pedals normal pulses. Trace edema, no varicosities. Abdomen:   abdomen soft and non-tender without masses Msk:  Back normal, normal gait. Muscle strength and tone normal. Pulses:  pulses normal in all 4 extremities Extremities:  No clubbing or cyanosis. Neurologic:  Alert and oriented x 3.   Impression & Recommendations:  Problem # 1:  ATRIAL FIBRILLATION (ICD-427.31) history of paroxysmal atrial fibrillation. She is asymptomatic. She has a pacemaker for possible sick sinus syndrome. We'll try to obtain the records for our notes from [facility] We will set her up for an evaluation by Dr. Caryl Comes for routine pacemaker checks  Her updated medication list for this problem includes:    Sotalol Hcl 120 Mg Tabs (Sotalol hcl) .Marland Kitchen... Take 1 by mouth two times a day    Warfarin Sodium 4 Mg Tabs (Warfarin sodium) .Marland Kitchen... 1-2 daily as  directed  Problem # 2:  HYPERLIPIDEMIA (ICD-272.4) We have talked to her about her cholesterol. Her daughter states that her weight is going up, she is poorly and not exercising enough. We have suggested that she watch her diet for several months, recheck her cholesterol later in the year. If it continues to be elevated, we could start low dose Lipitor or generic equivalent.  Problem # 3:  HYPERTENSION (ICD-401.9) blood pressure is well-controlled on today's visit. No medication changes made at this time  Her updated medication list for this problem includes:    Hydrochlorothiazide 25 Mg Tabs  (Hydrochlorothiazide) .Marland Kitchen... Take 1 by mouth once daily    Sotalol Hcl 120 Mg Tabs (Sotalol hcl) .Marland Kitchen... Take 1 by mouth two times a day    Lisinopril 40 Mg Tabs (Lisinopril) .Marland Kitchen... Take 1 by mouth once daily  Problem # 4:  MITRAL VALVE DISORDERS (ICD-424.0) history of significant MR. She is relatively asymptomatic and this has been managed medically for the past 4 years. No further evaluation at this time unless she becomes more symptomatic with shortness of breath, worsening lower extremity edema.  Her updated medication list for this problem includes:    Hydrochlorothiazide 25 Mg Tabs (Hydrochlorothiazide) .Marland Kitchen... Take 1 by mouth once daily    Sotalol Hcl 120 Mg Tabs (Sotalol hcl) .Marland Kitchen... Take 1 by mouth two times a day    Lisinopril 40 Mg Tabs (Lisinopril) .Marland Kitchen... Take 1 by mouth once daily    Nitroglycerin 0.4 Mg Subl (Nitroglycerin) .Marland Kitchen... As needed  Patient Instructions: 1)  Your physician recommends that you schedule a follow-up appointment in: ASAP with Dr. Caryl Comes 2)  Your physician recommends that you continue on your current medications as directed. Please refer to the Current Medication list given to you today. 3)  Your physician wants you to follow-up in:  1 year You will receive a reminder letter in the mail two months in advance. If you don't receive a letter, please call our  office to schedule the follow-up appointment. 4)  Your physician recommends a low cholesterol, low fat diet. 5)  Your physician discussed the importance of regular exercise and recommended that you start or continue a regular exercise program for good health.

## 2010-05-19 NOTE — Progress Notes (Signed)
Summary: call a nurse   Phone Note Call from Patient   Summary of Call: Triage Record Num: [id number] Operator: Nancie Neas Patient Name: Amanda [name] Call Date & Time: 02/09/2010 6:13:48PM Patient Phone: [phone] PCP: Viviana Simpler Patient Gender: Female PCP Fax : [phone] Patient DOB: [birth year] Practice Name: [facility] Reason for Call: Daughter./ [name] calling. Pt lives at [facility] and was prescribed Zpak 02/06/2010 by [facility] facilty provider for with sinus infection -- -. Today, 02/09/2010, daughter was told she has had shaking uncontrollably with bad cough. Nurse came to evaluate pt at [facility] appt , and was told to be seen at [facility] clinic tonight. Daughter is wanting to know if office does have walkin clinic. RN adv that office is closed and can be seen as walk in at [facility] urgent care clinic , or [facility].Rn offered to call and triage pt , and daughter declined stated she is going to stay with pt and will call back if needed. Protocol(s) Used: Office Note Recommended Outcome per Protocol: Information Noted and Sent to Office Reason for Outcome: Caller information to office Care Advice:  ~ Initial call taken by: Lacretia Nicks,  February 10, 2010 8:34 AM  Follow-up for Phone Call        Please call and check on her Claris Gower MD  February 10, 2010 8:36 AM   pt scheduled appt today at 12:15pm Follow-up by: Edwin Dada CMA Deborra Medina),  February 10, 2010 9:41 AM

## 2010-05-19 NOTE — Letter (Signed)
Summary: [employer]   Imported By: Zenovia Jarred 12/09/2009 14:57:29  _____________________________________________________________________  External Attachment:    Type:   Image     Comment:   External Document

## 2010-05-19 NOTE — Letter (Signed)
Summary: PHI  PHI   Imported By: Zenovia Jarred 12/09/2009 14:58:00  _____________________________________________________________________  External Attachment:    Type:   Image     Comment:   External Document

## 2010-05-19 NOTE — Progress Notes (Signed)
Summary: RESULTS  Phone Note Call from Patient Call back at [phone]   Caller: [name]) Call For: Amanda [name] Summary of Call: Amanda [name] THE PT'S LAB RESULTS Initial call taken by: Zenovia Jarred,  December 19, 2009 1:50 PM  Follow-up for Phone Call        PT'S DAUGHTER WOULD [place] [id number] Follow-up by: Zenovia Jarred,  December 24, 2009 2:07 PM  Additional Follow-up for Phone Call Additional follow up Details #1::        notified daughter report has not but signed off by Dr. Caryl [name] and if not by tomorrow, we will have another physician review to get some answers on what to do about medications. Additional Follow-up by: Dolores Lory, [place],  December 24, 2009 5:11 PM    Additional Follow-up for Phone Call Additional follow up Details #2::    Notified [name] (daughter) regarding TSH and told slight elevation..Dr. Rockey Situ does not see any need to change dose of thyroid medication but to contact Dr. Silvio Pate on Monday to see if he wants to change the thyroid medication. Follow-up by: Dolores Lory, CMA,  December 26, 2009 5:09 PM

## 2010-05-19 NOTE — Progress Notes (Signed)
Summary: Hearing Test.  Phone Note Call from Patient   Caller: Daughter Call For: Claris Gower MD Summary of Call: Pts daughter came to the office on Friday, Jan 7th . Would like a hearing test performed for pt.   daughters name Aliene Beams- call back # [phone] Initial call taken by: Allen Wiler,  April 25, 2009 11:45 AM  Follow-up for Phone Call        We can set up nurse visit for hearing screen with the hand held audiometer If sig problems, can make referral to ENT Follow-up by: Claris Gower MD,  April 25, 2009 8:02 PM  Additional Follow-up for Phone Call Additional follow up Details #1::        pt's daughter states that pt has a hearing aid and daughter doesn't think it's the right one for her mother, daughter states they see a audiologist and she doesn't trust him and the audiologist doesn't sell the type of hearing aid her mother wears, daughter doesn't think an ENT will help, but she will take her mother to one if that what Dr. Silvio Pate recommend. Also daughter wanted to know why we couldn't do her mothers hearing test here. I advised that we don't do hearing tests to that extent. Please advise DeShannon Tamala Julian CMA [facility])  April 28, 2009 9:58 AM   we have only very basic hearing tests here. I think a second opinion at ENT makes sense---they will have their audiologist check as well Additional Follow-up by: Claris Gower MD,  April 28, 2009 10:16 AM  New Problems: HEARING LOSS (ICD-389.9)   Additional Follow-up for Phone Call Additional follow up Details #2::    Patient was seen by Audiologist in [facility], no longer needs referral for audiology, bur due to ear bleed from Cerumen cleaning will get an ASAP appt with ENT per Dr Silvio Pate. Follow-up by: Haynes Bast,  May 05, 2009 2:40 PM  New Problems: HEARING LOSS (ICD-389.9)

## 2010-05-19 NOTE — Op Note (Signed)
Summary: Pacemaker  Pacemaker   Imported By: Zenovia Jarred 12/31/2009 12:22:38  _____________________________________________________________________  External Attachment:    Type:   Image     Comment:   External Document

## 2010-05-19 NOTE — Letter (Signed)
Summary: Medical Record Release  Medical Record Release   Imported By: Zenovia Jarred 12/10/2009 16:34:34  _____________________________________________________________________  External Attachment:    Type:   Image     Comment:   External Document

## 2010-05-19 NOTE — Progress Notes (Signed)
Summary: Elevated TSH  Phone Note Call from Patient Call back at [phone]   Caller: Daughter/[name] Call For: Amanda Gower MD Summary of Call: Calling because her mother takes Synthroid and her TSH level tested high when she saw the heart doctor at [facility], it was at 5.3. Patient has had 2 flare ups with bladder infections recently and has taken all of her antibiotics. Patient is not having any UTI symptoms at this time.  Daughter states that the patient has an appt. to check her PT here on 01/21/10 and wants to know if she should see you when she comes in at that time?  [facility] Initial call taken by: Emelia Salisbury LPN,  October  3, 624THL 1:41 PM  Follow-up for Phone Call        5.3 is very borderline and is not likely to indicate a clinically important underactive thyroid There are risks with taking thyroid hormone as well  Please check a free T4 when she is here for protime Only needs Rx if this is low Follow-up by: Amanda Gower MD,  January 19, 2010 2:00 PM  Additional Follow-up for Phone Call Additional follow up Details #1::        spoke with daughter and advised results, pt's daughter would like a refill of the abx you prescribed in 02/2009 for UTI. Daughter states that her mother has had 2 "flare up's" of UTI? I advised daughter that her mother may need to be seen but I would ask for refill. I also changed lab order to ask for free T4. [name] CMA [facility] Medina)  January 19, 2010 3:45 PM   Yes, she would need to be seen to assess her clinical status Amanda Gower MD  January 19, 2010 3:48 PM   pt's daughter scheduled for 01/21/2010 Additional Follow-up by: Edwin Dada CMA Deborra Medina),  January 19, 2010 4:56 PM

## 2010-05-19 NOTE — Assessment & Plan Note (Signed)
Summary: FOLLOW-UP/DS   Vital Signs:  Patient profile:   77 year old female Weight:      134 pounds Temp:     98.5 degrees F oral Pulse rate:   64 / minute Pulse rhythm:   regular Resp:     20 per minute BP sitting:   128 / 70  (left arm) Cuff size:   regular  Vitals Entered By: Edwin Dada CMA Deborra Medina) (February 19, 2010 3:13 PM) CC: follow-up visit   History of Present Illness: started antibiotic (levaquin) on Monday Today she finally is starting to feel some better  still a little cough Very little sputum Not short of breath now  No body aches today  No fever No night sweats or chills since the day before the levaquin  having some eye redness on right some change in vision no sig discharge has been trying murine  Allergies: 1)  ! Sulfa  Past History:  Past medical, surgical, family and social histories (including risk factors) reviewed for relevance to current acute and chronic problems.  Past Medical History: Reviewed history from 12/08/2009 and no changes required. Atrial fibrillation Hyperlipidemia Hypertension Hypothyroidism--after Rx for thyroid cancer Osteoarthritis Sleep disturbance Urinary incontinence--usually from UTIs Mitral regurgitation Depression pacemaker  Past Surgical History: Reviewed history from 12/27/2008 and no changes required. Tonsils and adenoids 1937 Hysterectomy 1972 Goiter removed--cancerous. Then got RAI  1976 Breast  Lumpectomy-1983 Breast biopsy-- 2002 Atrial fib--3/07 Pacemaker inserted 5/07 (after syncope) Cataracts bilat  Family History: Reviewed history from 12/27/2008 and no changes required. Dad died after broken hip @86  Mom died of old age at 24 5 siblings HTN, DM in mom, brother sister with stroke Brother wtih CAD (CABG) 1 brother with prostate cancer No breast or colon cancer  Social History: Reviewed history from 12/27/2008 and no changes required. Widowed 2002 2 daughters--1 local, other in  [place] Was homemaker--farmed/garden Never Smoked Alcohol use-no  Has living will Daughter [name]) is health care POA Discussed DNR and she requests Would not want tube feeds  Review of Systems       appetite is some better now No diarrhea or vomiting  Physical Exam  General:  alert.  NAD Eyes:  mild right tarsal and bulbar injeciton left eye spared Neck:  supple, no masses, and no cervical lymphadenopathy.   Lungs:  normal respiratory effort, no intercostal retractions, no accessory muscle use, no dullness, no crackles, and no wheezes.  Breath sounds slightly decreased at right base   Impression & Recommendations:  Problem # 1:  FEVER (ICD-780.60) Assessment Improved seems to have had a respiratory infection that is better now Will finish out levaquin check CXR if has any persistent symptoms  Problem # 2:  CONJUNCTIVITIS (ICD-372.30) Assessment: New  will treat with drops Due for eye exam--daughter will set up with [facility]  Her updated medication list for this problem includes:    Ciprofloxacin Hcl 0.3 % Soln (Ciprofloxacin hcl) .Marland Kitchen... 2 drops in each eye four times daily for 5 days  Complete Medication List: 1)  Mirtazapine 15 Mg Tabs (Mirtazapine) .Marland Kitchen.. 1 tab at bedtime 2)  Warfarin Sodium 4 Mg Tabs (Warfarin sodium) .Marland Kitchen.. 1-2 daily as  directed 3)  Hydrochlorothiazide 25 Mg Tabs (Hydrochlorothiazide) .... Take 1 by mouth once daily 4)  Sotalol Hcl 120 Mg Tabs (Sotalol hcl) .... Take 1 by mouth two times a day 5)  Lisinopril 40 Mg Tabs (Lisinopril) .... Take 1 by mouth once daily 6)  Levothyroxine Sodium 50 Mcg  Tabs (Levothyroxine sodium) .... Take 1 by mouth once daily 7)  Nitroglycerin 0.4 Mg Subl (Nitroglycerin) .... As needed 8)  Tylenol Extra Strength 500 Mg Tabs (Acetaminophen) .... As needed 9)  Levofloxacin 500 Mg Tabs (Levofloxacin) .Marland Kitchen.. 1 tab daily for respiratory infection 10)  Ciprofloxacin Hcl 0.3 % Soln (Ciprofloxacin hcl) .... 2 drops in  each eye four times daily for 5 days  Patient Instructions: 1)  Please keep February follow up appt Prescriptions: CIPROFLOXACIN HCL 0.3 % SOLN (CIPROFLOXACIN HCL) 2 drops in each eye four times daily for 5 days  #1 bottle x 0   Entered and Authorized by:   Claris Gower MD   Signed by:   Claris Gower MD on 02/19/2010   Method used:   [facility]        [facility] [place]       [address]       [place], [place]  [address]       Ph: [phone] or [phone]       Fax: [phone]   RxID:   [id number]    Orders Added: 1)  Est. Patient Level III [id number]    Current Allergies (reviewed today): ! SULFA  Appended Document: FOLLOW-UP/DS  Laboratory Results   Blood Tests   Date/Time Recieved: February 19, 2010 4:14 PM  Date/Time Reported: February 19, 2010 4:14 PM   PT: 31.2 s   (Normal Range: 10.6-13.4)  INR: 2.6   (Normal Range: 0.88-1.12   Therap INR: 2.0-3.5)      ANTICOAGULATION RECORD PREVIOUS REGIMEN & LAB RESULTS Anticoagulation Diagnosis:  Atrial fibrillation on  04/25/2009 Previous INR Goal Range:  2.0-3.0 on  04/25/2009 Previous INR:  2.9 on  01/28/2010 Previous Coumadin Dose(mg):  4mg  daily on  01/28/2010 Previous Regimen:  4mg  daily on  01/28/2010 Previous Coagulation Comments:  . on  07/23/2009  NEW REGIMEN & LAB RESULTS Current INR: 2.6 Regimen: 4mg  daily  (no change)  Yaslene Lindamood: letvak      Repeat testing in: 4 weeks MEDICATIONS MIRTAZAPINE 15 MG TABS (MIRTAZAPINE) 1 tab at bedtime WARFARIN SODIUM 4 MG TABS (WARFARIN SODIUM) 1-2 daily as  directed HYDROCHLOROTHIAZIDE 25 MG TABS (HYDROCHLOROTHIAZIDE) take 1 by mouth once daily SOTALOL HCL 120 MG TABS (SOTALOL HCL) take 1 by mouth two times a day LISINOPRIL 40 MG TABS (LISINOPRIL) take 1 by mouth once daily LEVOTHYROXINE SODIUM 50 MCG TABS (LEVOTHYROXINE SODIUM) take 1 by mouth once daily NITROGLYCERIN 0.4 MG SUBL (NITROGLYCERIN) as needed TYLENOL  EXTRA STRENGTH 500 MG TABS (ACETAMINOPHEN) as needed LEVOFLOXACIN 500 MG TABS (LEVOFLOXACIN) 1 tab daily for respiratory infection CIPROFLOXACIN HCL 0.3 % SOLN (CIPROFLOXACIN HCL) 2 drops in each eye four times daily for 5 days  Dose has been reviewed with patient or caretaker during this visit.  Reviewed by: Dorma Russell  Anticoagulation Visit Questionnaire      Coumadin dose missed/changed:  No      Abnormal Bleeding Symptoms:  No Any diet changes including alcohol intake, vegetables or greens since the last visit:  No Any illnesses or hospitalizations since the last visit:  Yes      Recent Illness/Hospitalizations:  on antibiotics Any signs of clotting since the last visit (including chest discomfort, dizziness, shortness of breath, arm tingling, slurred speech, swelling or redness in leg):  No

## 2010-05-19 NOTE — Letter (Signed)
Summary: [facility]   Imported By: Edmonia James 04/21/2009 13:06:55  _____________________________________________________________________  External Attachment:    Type:   Image     Comment:   External Document  Appended Document: [facility] proceeding with cystoscopy and ultrasound to evaluate UTIs consider macrodantin prophylaxis

## 2010-05-19 NOTE — Assessment & Plan Note (Signed)
Summary: 4 month follow up/rbh   Vital Signs:  Patient profile:   77 year old female Weight:      124 pounds Temp:     98.2 degrees F oral Pulse rate:   72 / minute Pulse rhythm:   regular BP sitting:   118 / 60  (left arm) Cuff size:   regular  Vitals Entered By: Edwin Dada CMA Deborra Medina) (May 05, 2009 10:09 AM) CC: 63month follow-up   History of Present Illness: Here with granddaughter again  Had another bladder infection this weekend Took a few of the cefuroxime--- 3 pills and this cleared things up Did see Dr Jacqlyn Larsen  and he recommended macrodantin. She got sick soon after starting so she stopped it Tends to get 4-5 infections a year discussed Rx alternatives Dr Jacqlyn Larsen is considering pessary, she doesn't want this  Has had some depression Appetite has been poor went through flu-like illness several weeks ago---dragged her down some Mood had been poor Was given mirtazapine in past--helped anxiety and depression as well as her appetite  Having trouble with ear wax Hearing is worse--evaluated by audiologist in [facility]  Has arthritic pain afraid of OTC meds to take Had used tylenol in past--does help some  No heart trouble No palpitations --only an occ flutter No chest pain or SOB    Allergies: 1)  ! Sulfa  Past History:  Past medical, surgical, family and social histories (including risk factors) reviewed for relevance to current acute and chronic problems.  Past Medical History: Atrial fibrillation Hyperlipidemia Hypertension Hypothyroidism--after Rx for thyroid cancer Osteoarthritis Sleep disturbance Urinary incontinence--usually from UTIs Mitral regurgitation Depression  Past Surgical History: Reviewed history from 12/27/2008 and no changes required. Tonsils and adenoids 1937 Hysterectomy 1972 Goiter removed--cancerous. Then got RAI  1976 Breast  Lumpectomy-1983 Breast biopsy-- 2002 Atrial fib--3/07 Pacemaker inserted 5/07 (after  syncope) Cataracts bilat  Family History: Reviewed history from 12/27/2008 and no changes required. Dad died after broken hip @86  Mom died of old age at 59 5 siblings HTN, DM in mom, brother sister with stroke Brother wtih CAD (CABG) 1 brother with prostate cancer No breast or colon cancer  Social History: Reviewed history from 12/27/2008 and no changes required. 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   Ears:  cerumen plug on left removed with currette--mild irriation and bleeding after but canal was clear Neck:  supple, no masses, no thyromegaly, no carotid bruits, and no cervical lymphadenopathy.   Lungs:  normal respiratory effort and normal breath sounds.   Heart:  normal rate, no murmur, no gallop, and irregular rhythm.   Extremities:  no edema Psych:  normally interactive, good eye contact, not anxious appearing, and dysphoric affect.     Impression & Recommendations:  Problem # 1:  HEARING LOSS (ICD-389.9) Assessment Comment Only cerumen cleaned will go back to audiologist now  Problem # 2:  ACUTE CYSTITIS (ICD-595.0) Assessment: Comment Only discussed alternatives she will use meds just with symptoms since she has been able to successfully treat each time it comes on  Problem # 3:  DEPRESSION (ICD-311) Assessment: New will restart the mirtazapine she may try 1/2  at first  Her updated medication list for this problem includes:    Mirtazapine 15 Mg Tabs (Mirtazapine) .Marland Kitchen... 1 tab at bedtime  Problem # 4:  OSTEOARTHRITIS (ICD-715.90) Assessment: Unchanged okay to try tylenol  Problem # 5:  ATRIAL  FIBRILLATION (ICD-427.31) Assessment: Comment Only good rate control on coumadin  Her updated medication list for this problem includes:    Sotalol Hcl 120 Mg Tabs (Sotalol hcl) .Marland Kitchen... Take 1 by mouth two times a day    Warfarin Sodium 4 Mg Tabs (Warfarin sodium) .Marland Kitchen... 1-2 daily as  directed  Problem # 6:  HYPERTENSION (ICD-401.9) Assessment: Unchanged good control no change needed Her updated medication list for this problem includes:    Hydrochlorothiazide 25 Mg Tabs (Hydrochlorothiazide) .Marland Kitchen... Take 1 by mouth once daily    Sotalol Hcl 120 Mg Tabs (Sotalol hcl) .Marland Kitchen... Take 1 by mouth two times a day    Lisinopril 40 Mg Tabs (Lisinopril) .Marland Kitchen... Take 1 by mouth once daily  BP today: 118/60 Prior BP: 140/70 (02/17/2009)  Labs Reviewed: K+: 3.9 (12/27/2008) Creat: : 1.1 (12/27/2008)   Chol: 236 (12/27/2008)   HDL: 62.60 (12/27/2008)   TG: 245.0 (12/27/2008)  Complete Medication List: 1)  Hydrochlorothiazide 25 Mg Tabs (Hydrochlorothiazide) .... Take 1 by mouth once daily 2)  Nitroglycerin 0.4 Mg Subl (Nitroglycerin) .... As needed 3)  Sotalol Hcl 120 Mg Tabs (Sotalol hcl) .... Take 1 by mouth two times a day 4)  Lisinopril 40 Mg Tabs (Lisinopril) .... Take 1 by mouth once daily 5)  Levothyroxine Sodium 50 Mcg Tabs (Levothyroxine sodium) .... Take 1 by mouth once daily 6)  Warfarin Sodium 4 Mg Tabs (Warfarin sodium) .Marland Kitchen.. 1-2 daily as  directed 7)  Mirtazapine 15 Mg Tabs (Mirtazapine) .Marland Kitchen.. 1 tab at bedtime  Patient Instructions: 1)  Please schedule a follow-up appointment in 6 weeks.  Prescriptions: MIRTAZAPINE 15 MG TABS (MIRTAZAPINE) 1 tab at bedtime  #30 x 12   Entered and Authorized by:   Claris Gower MD   Signed by:   Claris Gower MD on 05/05/2009   Method used:   [facility]        [facility])       [address]       [place], [place]  [address]       Ph: [phone] or [phone]       Fax: [phone]   RxID:    [id number]   Current Allergies (reviewed today): ! SULFA

## 2010-05-20 ENCOUNTER — Encounter: Payer: Self-pay | Admitting: Internal Medicine

## 2010-05-20 ENCOUNTER — Ambulatory Visit (INDEPENDENT_AMBULATORY_CARE_PROVIDER_SITE_OTHER): Payer: Medicare Other

## 2010-05-20 ENCOUNTER — Telehealth: Payer: Self-pay | Admitting: Internal Medicine

## 2010-05-20 ENCOUNTER — Ambulatory Visit: Admit: 2010-05-20 | Payer: Self-pay | Admitting: Internal Medicine

## 2010-05-20 DIAGNOSIS — I4891 Unspecified atrial fibrillation: Secondary | ICD-10-CM

## 2010-05-20 DIAGNOSIS — Z7901 Long term (current) use of anticoagulants: Secondary | ICD-10-CM

## 2010-05-20 DIAGNOSIS — Z5181 Encounter for therapeutic drug level monitoring: Secondary | ICD-10-CM

## 2010-05-20 LAB — CONVERTED CEMR LAB: INR: 2.4

## 2010-05-21 NOTE — Letter (Signed)
Summary: Remote Device Check  [facility] [place]  [id number] [place]   [place], [place] [address]   Phone: [phone]  Fax: [phone]     April 29, 2010 MRN: [id number]   [place], [place]  [address]   Dear Ms. Housel,   Your remote transmission was recieved and reviewed by your physician.  All diagnostics were within normal limits for you.  __X___Your next transmission is scheduled for:  06-18-2010.  Please transmit at any time this day.  If you have a wireless device your transmission will be sent automatically.   Sincerely,  Shelly Bombard

## 2010-05-21 NOTE — Cardiovascular Report (Signed)
Summary: Office Visit Remote   Office Visit Remote   Imported By: Sallee Provencal 05/01/2010 11:26:05  _____________________________________________________________________  External Attachment:    Type:   Image     Comment:   External Document

## 2010-05-27 NOTE — Medication Information (Signed)
Summary: protime/tw   PCP: Claris Gower MD Indication 1: Atrial fibrillation PT 29.1 INR RANGE 2.0-3.0           Allergies: 1)  ! Sulfa  Anticoagulation Management History:      Positive risk factors for bleeding include an age of 75 years or older.  The bleeding index is 'intermediate risk'.  Positive CHADS2 values include History of HTN and Age > 75 years old.  Her last INR was 2.4 and today's INR is 2.4.  Prothrombin time is 29.1.    Anticoagulation Management Assessment/Plan:      The patient's current anticoagulation dose is Warfarin sodium 4 mg tabs: 1-2 daily as  directed.  The next INR is due 4 weeks.        Laboratory Results   Blood Tests   Date/Time Recieved: May 20, 2010 12:39 PM  Date/Time Reported: May 20, 2010 12:39 PM   PT: 29.1 s   (Normal Range: 10.6-13.4)  INR: 2.4   (Normal Range: 0.88-1.12   Therap INR: 2.0-3.5)      ANTICOAGULATION RECORD PREVIOUS REGIMEN & LAB RESULTS Anticoagulation Diagnosis:  Atrial fibrillation on  04/25/2009 Previous INR Goal Range:  2.0-3.0 on  04/25/2009 Previous INR:  2.4 on  04/22/2010 Previous Coumadin Dose(mg):  hold 1 dose then resume  4mg  daily, 2mg  tues, thurs on  04/08/2010 Previous Regimen:  4mg  daily, 2mg  tues, thurs,sat on  04/08/2010 Previous Coagulation Comments:  . on  07/23/2009  NEW REGIMEN & LAB RESULTS Current INR: 2.4 Regimen: 4mg  daily, 2mg  tues, thurs,sat  (no change)  Provider: Phuc Kluttz      Repeat testing in: 4 weeks MEDICATIONS MIRTAZAPINE 15 MG TABS (MIRTAZAPINE) 1 tab at bedtime WARFARIN SODIUM 4 MG TABS (WARFARIN SODIUM) 1-2 daily as  directed HYDROCHLOROTHIAZIDE 25 MG TABS (HYDROCHLOROTHIAZIDE) take 1 by mouth once daily SOTALOL HCL 120 MG TABS (SOTALOL HCL) take 1 by mouth two times a day LISINOPRIL 40 MG TABS (LISINOPRIL) take 1 by mouth once daily LEVOTHYROXINE SODIUM 50 MCG TABS (LEVOTHYROXINE SODIUM) take 1 by mouth once daily NITROGLYCERIN 0.4 MG SUBL  (NITROGLYCERIN) as needed TYLENOL EXTRA STRENGTH 500 MG TABS (ACETAMINOPHEN) as needed  Dose has been reviewed with patient or caretaker during this visit.  Reviewed by: Dorma Russell  Anticoagulation Visit Questionnaire      Coumadin dose missed/changed:  No      Abnormal Bleeding Symptoms:  No Any diet changes including alcohol intake, vegetables or greens since the last visit:  No Any illnesses or hospitalizations since the last visit:  No Any signs of clotting since the last visit (including chest discomfort, dizziness, shortness of breath, arm tingling, slurred speech, swelling or redness in leg):  No

## 2010-05-27 NOTE — Progress Notes (Signed)
Summary: refill request for sotalol  Phone Note Refill Request Message from:  Patient  Refills Requested: Medication #1:  SOTALOL HCL 120 MG TABS take 1 by mouth two times a day [facility]  Initial call taken by: Marty Heck CMA, AAMA,  May 20, 2010 3:10 PM  Follow-up for Phone Call        okay to send Rx for 1 year Follow-up by: Claris Gower MD,  May 20, 2010 7:10 PM  Additional Follow-up for Phone Call Additional follow up Details #1::        Rx faxed to pharmacy Additional Follow-up by: [name] CMA Deborra Medina),  May 21, 2010 8:07 AM    Prescriptions: SOTALOL HCL 120 MG TABS (SOTALOL HCL) take 1 by mouth two times a day  #180 x 3   Entered by:   Edwin Dada CMA ([place])   Authorized by:   Claris Gower MD   Signed by:   Edwin Dada CMA ([place]) on 05/21/2010   Method used:   Electronically to        [facility] (mail-order)       [address] [place] [address]  [address]       Ph: [phone]       Fax: [phone]   [id number]

## 2010-06-03 ENCOUNTER — Ambulatory Visit: Payer: Self-pay | Admitting: Internal Medicine

## 2010-06-10 ENCOUNTER — Ambulatory Visit (INDEPENDENT_AMBULATORY_CARE_PROVIDER_SITE_OTHER): Payer: Medicare Other | Admitting: Internal Medicine

## 2010-06-10 ENCOUNTER — Encounter: Payer: Self-pay | Admitting: Internal Medicine

## 2010-06-10 ENCOUNTER — Other Ambulatory Visit: Payer: Self-pay | Admitting: Internal Medicine

## 2010-06-10 DIAGNOSIS — E039 Hypothyroidism, unspecified: Secondary | ICD-10-CM

## 2010-06-10 DIAGNOSIS — Z5181 Encounter for therapeutic drug level monitoring: Secondary | ICD-10-CM

## 2010-06-10 DIAGNOSIS — F329 Major depressive disorder, single episode, unspecified: Secondary | ICD-10-CM

## 2010-06-10 DIAGNOSIS — I10 Essential (primary) hypertension: Secondary | ICD-10-CM

## 2010-06-10 DIAGNOSIS — I4891 Unspecified atrial fibrillation: Secondary | ICD-10-CM

## 2010-06-10 DIAGNOSIS — Z7901 Long term (current) use of anticoagulants: Secondary | ICD-10-CM

## 2010-06-10 LAB — RENAL FUNCTION PANEL
CO2: 31 mEq/L (ref 19–32)
Chloride: 101 mEq/L (ref 96–112)
GFR: 43.36 mL/min — ABNORMAL LOW (ref >60.00)
Phosphorus: 3.6 mg/dL (ref 2.3–4.6)
Potassium: 4.3 mEq/L (ref 3.5–5.1)

## 2010-06-10 LAB — T4, FREE: Free T4: 0.81 ng/dL (ref 0.60–1.60)

## 2010-06-16 NOTE — Assessment & Plan Note (Signed)
Summary: 6 m f/u dlo   Vital Signs:  Patient profile:   77 year old female Weight:      135 pounds Temp:     97.9 degrees F oral Pulse rate:   78 / minute Pulse rhythm:   regular BP sitting:   128 / 74  (left arm) Cuff size:   regular  Vitals Entered By: Edwin Dada CMA Deborra Medina) (June 10, 2010 11:12 AM) CC: 19mth follow-up   History of Present Illness: Here with daughter Did get over the respiratory infection Still gets mucus in the morning--some hoarseness No fever No SOB No sig cough--just in AM from drainage Discussed humidifier  Did have spell of pounding in chest a couple of weeks ago Lasted  ~20 minutes no chest pain No SOB  Sleep is still fragmented at times--mind will wander at times Mood has been okay No sig depression  Allergies: 1)  ! Sulfa  Review of Systems       Occ heartburn ---uses OTC antacids at bedtime (gas-x) No swallowing problems appetite is good weight is stable  Physical Exam  General:  alert and normal appearance.   Neck:  supple, no masses, no thyromegaly, no carotid bruits, and no cervical lymphadenopathy.   Lungs:  normal respiratory effort, no intercostal retractions, no accessory muscle use, and normal breath sounds.   Heart:  normal rate, regular rhythm, no murmur, and no gallop.   Extremities:  no edema Psych:  normally interactive, good eye contact, not anxious appearing, and not depressed appearing.     Impression & Recommendations:  Problem # 1:  ATRIAL FIBRILLATION (ICD-427.31) Assessment Deteriorated recent spell but brief and no worrisome symptoms no med changes check thyroid  Her updated medication list for this problem includes:    Warfarin Sodium 4 Mg Tabs (Warfarin sodium) .Marland Kitchen... 1-2 daily as  directed    Sotalol Hcl 120 Mg Tabs (Sotalol hcl) .Marland Kitchen... Take 1 by mouth two times a day  Problem # 2:  HYPERTENSION (ICD-401.9) Assessment: Unchanged  good control will check labs  Her updated medication list  for this problem includes:    Hydrochlorothiazide 25 Mg Tabs (Hydrochlorothiazide) .Marland Kitchen... Take 1 by mouth once daily    Sotalol Hcl 120 Mg Tabs (Sotalol hcl) .Marland Kitchen... Take 1 by mouth two times a day    Lisinopril 40 Mg Tabs (Lisinopril) .Marland Kitchen... Take 1 by mouth once daily  BP today: 128/74 Prior BP: 128/70 (02/19/2010)  Labs Reviewed: K+: 4.1 (12/15/2009) Creat: : 1.05 (12/15/2009)   Chol: 236 (12/27/2008)   HDL: 62.60 (12/27/2008)   TG: 245.0 (12/27/2008)  Orders: TLB-Renal Function Panel (80069-RENAL) Venipuncture [id number])  Problem # 3:  HYPOTHYROIDISM (ICD-244.9) Assessment: Unchanged  will recheck given atrial fib spell  Her updated medication list for this problem includes:    Levothyroxine Sodium 50 Mcg Tabs (Levothyroxine sodium) .Marland Kitchen... Take 1 by mouth once daily  Orders: TLB-T4 (Thyrox), Free [phone])  Problem # 4:  DEPRESSION (ICD-311) Assessment: Unchanged mood is okay and stable continue med to help sleep still also  Her updated medication list for this problem includes:    Mirtazapine 15 Mg Tabs (Mirtazapine) .Marland Kitchen... 1 tab at bedtime  Complete Medication List: 1)  Mirtazapine 15 Mg Tabs (Mirtazapine) .Marland Kitchen.. 1 tab at bedtime 2)  Warfarin Sodium 4 Mg Tabs (Warfarin sodium) .Marland Kitchen.. 1-2 daily as  directed 3)  Hydrochlorothiazide 25 Mg Tabs (Hydrochlorothiazide) .... Take 1 by mouth once daily 4)  Sotalol Hcl 120 Mg Tabs (Sotalol hcl) .... Take 1  by mouth two times a day 5)  Lisinopril 40 Mg Tabs (Lisinopril) .... Take 1 by mouth once daily 6)  Levothyroxine Sodium 50 Mcg Tabs (Levothyroxine sodium) .... Take 1 by mouth once daily 7)  Nitroglycerin 0.4 Mg Subl (Nitroglycerin) .... As needed 8)  Tylenol Extra Strength 500 Mg Tabs (Acetaminophen) .... As needed  Patient Instructions: 1)  Please schedule a follow-up appointment in 6 months .    Orders Added: 1)  Est. Patient Level IV [id number] 2)  TLB-T4 (Thyrox), Free [id number] 3)  TLB-Renal Function Panel  [80069-RENAL] 4)  Venipuncture [id number]    Current Allergies (reviewed today): ! SULFA  Appended Document: 6 m f/u dlo  Laboratory Results   Blood Tests   Date/Time Recieved: June 10, 2010 12:00 PM  Date/Time Reported: June 10, 2010 12:00 PM   PT: 25.4 s   (Normal Range: 10.6-13.4)  INR: 2.1   (Normal Range: 0.88-1.12   Therap INR: 2.0-3.5)      ANTICOAGULATION RECORD PREVIOUS REGIMEN & LAB RESULTS Anticoagulation Diagnosis:  Atrial fibrillation on  04/25/2009 Previous INR Goal Range:  2.0-3.0 on  04/25/2009 Previous INR:  2.4 on  05/20/2010 Previous Coumadin Dose(mg):  hold 1 dose then resume  4mg  daily, 2mg  tues, thurs on  04/08/2010 Previous Regimen:  4mg  daily, 2mg  tues, thurs,sat on  04/08/2010 Previous Coagulation Comments:  . on  07/23/2009  NEW REGIMEN & LAB RESULTS Current INR: 2.1 Regimen: 4mg  daily, 2mg  tues, thurs,sat  (no change)  Provider: Earnestine Tuohey      Repeat testing in: 4 weeks MEDICATIONS MIRTAZAPINE 15 MG TABS (MIRTAZAPINE) 1 tab at bedtime WARFARIN SODIUM 4 MG TABS (WARFARIN SODIUM) 1-2 daily as  directed HYDROCHLOROTHIAZIDE 25 MG TABS (HYDROCHLOROTHIAZIDE) take 1 by mouth once daily SOTALOL HCL 120 MG TABS (SOTALOL HCL) take 1 by mouth two times a day LISINOPRIL 40 MG TABS (LISINOPRIL) take 1 by mouth once daily LEVOTHYROXINE SODIUM 50 MCG TABS (LEVOTHYROXINE SODIUM) take 1 by mouth once daily NITROGLYCERIN 0.4 MG SUBL (NITROGLYCERIN) as needed TYLENOL EXTRA STRENGTH 500 MG TABS (ACETAMINOPHEN) as needed  Dose has been reviewed with patient or caretaker during this visit.  Reviewed by: Dorma Russell  Anticoagulation Visit Questionnaire      Coumadin dose missed/changed:  No      Abnormal Bleeding Symptoms:  No Any diet changes including alcohol intake, vegetables or greens since the last visit:  No Any illnesses or hospitalizations since the last visit:  No Any signs of clotting since the last visit (including chest discomfort, dizziness,  shortness of breath, arm tingling, slurred speech, swelling or redness in leg):  No

## 2010-06-17 ENCOUNTER — Ambulatory Visit: Payer: PRIVATE HEALTH INSURANCE

## 2010-06-18 ENCOUNTER — Encounter: Payer: Self-pay | Admitting: Internal Medicine

## 2010-06-18 ENCOUNTER — Encounter (INDEPENDENT_AMBULATORY_CARE_PROVIDER_SITE_OTHER): Payer: Medicare Other

## 2010-06-18 DIAGNOSIS — I495 Sick sinus syndrome: Secondary | ICD-10-CM

## 2010-06-29 LAB — APTT: aPTT: 44 seconds — ABNORMAL HIGH (ref 24–37)

## 2010-06-29 LAB — PROTIME-INR
INR: 1.96 — ABNORMAL HIGH (ref 0.00–1.49)
Prothrombin Time: 22.5 seconds — ABNORMAL HIGH (ref 11.6–15.2)

## 2010-07-06 ENCOUNTER — Encounter: Payer: Self-pay | Admitting: *Deleted

## 2010-07-08 ENCOUNTER — Ambulatory Visit (INDEPENDENT_AMBULATORY_CARE_PROVIDER_SITE_OTHER): Payer: Medicare Other | Admitting: Internal Medicine

## 2010-07-08 DIAGNOSIS — Z5181 Encounter for therapeutic drug level monitoring: Secondary | ICD-10-CM

## 2010-07-08 DIAGNOSIS — I4891 Unspecified atrial fibrillation: Secondary | ICD-10-CM

## 2010-07-08 DIAGNOSIS — Z7901 Long term (current) use of anticoagulants: Secondary | ICD-10-CM

## 2010-07-08 LAB — POCT INR: INR: 2.1

## 2010-07-16 NOTE — Cardiovascular Report (Signed)
Summary: Office Visit   Office Visit   Imported By: Sallee Provencal 07/08/2010 09:39:06  _____________________________________________________________________  External Attachment:    Type:   Image     Comment:   External Document

## 2010-07-16 NOTE — Letter (Signed)
Summary: Remote Device Check  [facility] [place]  [id number] [place]   [place], [place] [address]   Phone: [phone]  Fax: [phone]     July 06, 2010 MRN: [id number]   ANJELICIA KNABB [place], [place]  [address]   Dear Ms. Malstrom,   Your remote transmission was recieved and reviewed by your physician.  All diagnostics were within normal limits for you.  __X___Your next transmission is scheduled for:   09-17-2010.  Please transmit at any time this day.  If you have a wireless device your transmission will be sent automatically.   Sincerely,  Shelly Bombard

## 2010-07-28 ENCOUNTER — Telehealth: Payer: Self-pay | Admitting: Radiology

## 2010-07-28 DIAGNOSIS — Z7901 Long term (current) use of anticoagulants: Secondary | ICD-10-CM

## 2010-07-28 DIAGNOSIS — I4891 Unspecified atrial fibrillation: Secondary | ICD-10-CM

## 2010-07-28 DIAGNOSIS — Z5181 Encounter for therapeutic drug level monitoring: Secondary | ICD-10-CM

## 2010-07-28 NOTE — Telephone Encounter (Signed)
Order for INR

## 2010-07-29 ENCOUNTER — Telehealth: Payer: Self-pay | Admitting: Radiology

## 2010-07-29 DIAGNOSIS — I4891 Unspecified atrial fibrillation: Secondary | ICD-10-CM

## 2010-07-29 DIAGNOSIS — Z5181 Encounter for therapeutic drug level monitoring: Secondary | ICD-10-CM

## 2010-07-29 DIAGNOSIS — Z7901 Long term (current) use of anticoagulants: Secondary | ICD-10-CM

## 2010-07-29 NOTE — Telephone Encounter (Signed)
Order for INR

## 2010-08-03 ENCOUNTER — Other Ambulatory Visit: Payer: Self-pay | Admitting: Internal Medicine

## 2010-08-05 ENCOUNTER — Ambulatory Visit (INDEPENDENT_AMBULATORY_CARE_PROVIDER_SITE_OTHER): Payer: Medicare Other | Admitting: Internal Medicine

## 2010-08-05 DIAGNOSIS — I4891 Unspecified atrial fibrillation: Secondary | ICD-10-CM

## 2010-08-05 DIAGNOSIS — Z5181 Encounter for therapeutic drug level monitoring: Secondary | ICD-10-CM

## 2010-08-05 DIAGNOSIS — Z7901 Long term (current) use of anticoagulants: Secondary | ICD-10-CM

## 2010-08-05 LAB — POCT INR: INR: 2.1

## 2010-08-05 NOTE — Patient Instructions (Signed)
Continue current dose, check in 4 weeks  

## 2010-08-20 ENCOUNTER — Other Ambulatory Visit: Payer: Self-pay | Admitting: *Deleted

## 2010-08-20 MED ORDER — WARFARIN SODIUM 4 MG PO TABS: ORAL_TABLET | ORAL | Status: DC

## 2010-08-25 ENCOUNTER — Telehealth: Payer: Self-pay | Admitting: *Deleted

## 2010-08-25 NOTE — Telephone Encounter (Signed)
[facility] has faxed a form stating that they are changing their generic form of levothyroxine, form is on your desk.

## 2010-08-26 NOTE — Telephone Encounter (Signed)
Form faxed

## 2010-08-26 NOTE — Telephone Encounter (Signed)
That is okay with me 

## 2010-09-02 ENCOUNTER — Ambulatory Visit (INDEPENDENT_AMBULATORY_CARE_PROVIDER_SITE_OTHER): Payer: Medicare Other | Admitting: Internal Medicine

## 2010-09-02 DIAGNOSIS — I4891 Unspecified atrial fibrillation: Secondary | ICD-10-CM

## 2010-09-02 DIAGNOSIS — Z5181 Encounter for therapeutic drug level monitoring: Secondary | ICD-10-CM

## 2010-09-02 DIAGNOSIS — Z7901 Long term (current) use of anticoagulants: Secondary | ICD-10-CM

## 2010-09-02 NOTE — Patient Instructions (Signed)
Continue current dose, check in 4 weeks  

## 2010-09-17 ENCOUNTER — Other Ambulatory Visit: Payer: Self-pay

## 2010-09-17 ENCOUNTER — Ambulatory Visit (INDEPENDENT_AMBULATORY_CARE_PROVIDER_SITE_OTHER): Payer: Medicare Other | Admitting: *Deleted

## 2010-09-17 DIAGNOSIS — I495 Sick sinus syndrome: Secondary | ICD-10-CM

## 2010-09-23 NOTE — Progress Notes (Signed)
PACER REMOTE

## 2010-09-29 ENCOUNTER — Ambulatory Visit (INDEPENDENT_AMBULATORY_CARE_PROVIDER_SITE_OTHER): Payer: Medicare Other | Admitting: Internal Medicine

## 2010-09-29 DIAGNOSIS — Z7901 Long term (current) use of anticoagulants: Secondary | ICD-10-CM

## 2010-09-29 DIAGNOSIS — I4891 Unspecified atrial fibrillation: Secondary | ICD-10-CM

## 2010-09-29 DIAGNOSIS — Z5181 Encounter for therapeutic drug level monitoring: Secondary | ICD-10-CM

## 2010-09-29 LAB — POCT INR: INR: 2.1

## 2010-09-29 NOTE — Patient Instructions (Signed)
Continue current dose, check in 4 weeks  

## 2010-10-18 ENCOUNTER — Encounter: Payer: Self-pay | Admitting: *Deleted

## 2010-10-28 ENCOUNTER — Ambulatory Visit (INDEPENDENT_AMBULATORY_CARE_PROVIDER_SITE_OTHER): Payer: Medicare Other | Admitting: Internal Medicine

## 2010-10-28 DIAGNOSIS — Z7901 Long term (current) use of anticoagulants: Secondary | ICD-10-CM

## 2010-10-28 DIAGNOSIS — I4891 Unspecified atrial fibrillation: Secondary | ICD-10-CM

## 2010-10-28 DIAGNOSIS — Z5181 Encounter for therapeutic drug level monitoring: Secondary | ICD-10-CM

## 2010-10-28 LAB — POCT INR: INR: 2.2

## 2010-10-28 NOTE — Patient Instructions (Signed)
4 mg daily, 2 mg T,TH,SAT, recheck 4 weeks

## 2010-11-05 ENCOUNTER — Encounter: Payer: Self-pay | Admitting: Cardiology

## 2010-11-05 ENCOUNTER — Encounter: Payer: Self-pay | Admitting: Internal Medicine

## 2010-11-18 ENCOUNTER — Encounter: Payer: Self-pay | Admitting: Cardiovascular Disease

## 2010-11-18 ENCOUNTER — Ambulatory Visit (INDEPENDENT_AMBULATORY_CARE_PROVIDER_SITE_OTHER): Payer: Medicare Other | Admitting: Cardiovascular Disease

## 2010-11-18 DIAGNOSIS — I10 Essential (primary) hypertension: Secondary | ICD-10-CM

## 2010-11-18 DIAGNOSIS — I059 Rheumatic mitral valve disease, unspecified: Secondary | ICD-10-CM

## 2010-11-18 DIAGNOSIS — E785 Hyperlipidemia, unspecified: Secondary | ICD-10-CM

## 2010-11-18 DIAGNOSIS — Z950 Presence of cardiac pacemaker: Secondary | ICD-10-CM

## 2010-11-18 DIAGNOSIS — I495 Sick sinus syndrome: Secondary | ICD-10-CM

## 2010-11-18 DIAGNOSIS — I4891 Unspecified atrial fibrillation: Secondary | ICD-10-CM

## 2010-11-18 NOTE — Assessment & Plan Note (Signed)
Appears to be tolerating sotalol well with no complaints. She is not acutely active and is not do regular exercise. No further medication changes made.

## 2010-11-18 NOTE — Assessment & Plan Note (Signed)
History of significant mitral valve regurgitation noted no significant symptoms of heart failure or fluid overload on today's visit. No shortness of breath.  No further medication changes made.

## 2010-11-18 NOTE — Patient Instructions (Signed)
You are doing well. No medication changes were made. Please call us if you have new issues that need to be addressed before your next appt.  We will call you for a follow up Appt. In 12 months  

## 2010-11-18 NOTE — Assessment & Plan Note (Signed)
Blood pressure is well controlled on today's visit. No changes made to the medications. 

## 2010-11-18 NOTE — Assessment & Plan Note (Signed)
No recent cholesterol check him on last was 2 years ago. We have suggested that she talk with Dr. LAD back if she is interested in having this redrawn when he she has her routine annual visit.

## 2010-11-18 NOTE — Assessment & Plan Note (Signed)
She will follow up with Dr. Jolyn Nap for her pacemaker.

## 2010-11-18 NOTE — Progress Notes (Signed)
Patient ID: Amanda [name], female    DOB: 11/16/1919, 77 y.o.   MRN: [id number]  HPI Comments: Amanda [name] is a pleasant 77 year old woman who  has a history of paroxysmal atrial fibrillation, normal systolic function by echocardiogram in 2007 with moderate to severe MR, mild aortic valve regurgitation, mildly dilated left atrium, Holter monitor and April 2007 showing frequent and complex supraventricular arrhythmia, cardiac CTA showing no significant coronary artery disease with atherosclerotic plaque in the thoracic aorta who has been managed on sotalol for rhythm control, pacer placed in May 2007 with a history of "fainting "prior to that who presents for routine followup.  Overall, she states that she has been doing well. She is unaware if she has had any recent episodes of atrial fibrillation. She is relatively sedentary, does walk  during the daytime and likes to read. She denies any shortness of breath, chest pain. . She has no complaints.   Cardiac catheterization in April 2007 showing no significant coronary artery disease.  EKG shows Atrial paced rhythm, possible normal sinus  with rate 63 beats per minute, Nonspecific ST and T wave abnormality in leads V3 through V6, 2, 3, aVF   Outpatient Encounter Prescriptions as of 11/18/2010  Medication Sig Dispense Refill  . acetaminophen (TYLENOL) 500 MG tablet Take 500 mg by mouth as needed.        . hydrochlorothiazide 25 MG tablet Take 25 mg by mouth daily.        Marland Kitchen levothyroxine (SYNTHROID, LEVOTHROID) 50 MCG tablet Take 50 mcg by mouth daily.       Marland Kitchen lisinopril (PRINIVIL,ZESTRIL) 40 MG tablet Take 40 mg by mouth daily.        . mirtazapine (REMERON) 15 MG tablet TAKE 1 TABLET BY MOUTH AT BEDTIME  30 tablet  6  . nitroGLYCERIN (NITROSTAT) 0.4 MG SL tablet Place 0.4 mg under the tongue every 5 (five) minutes as needed.        . sotalol (BETAPACE) 120 MG tablet Take 120 mg by mouth 2 (two) times daily.        Marland Kitchen warfarin (COUMADIN) 4 MG tablet  Take one to two daily as directed.  60 tablet  1     Review of Systems  Constitutional: Negative.   HENT: Negative.   Eyes: Negative.   Respiratory: Negative.   Cardiovascular: Negative.   Gastrointestinal: Negative.   Musculoskeletal: Negative.   Skin: Negative.   Neurological: Negative.   Hematological: Negative.   Psychiatric/Behavioral: Negative.   All other systems reviewed and are negative.    BP 146/80  Pulse 63  Ht 5\' 3"  (1.6 m)  Wt 134 lb (60.782 kg)  BMI 23.74 kg/m2\  Physical Exam  Nursing note and vitals reviewed. Constitutional: She is oriented to person, place, and time. She appears well-developed and well-nourished.  HENT:  Head: Normocephalic.  Nose: Nose normal.  Mouth/Throat: Oropharynx is clear and moist.  Eyes: Conjunctivae are normal. Pupils are equal, round, and reactive to light.  Neck: Normal range of motion. Neck supple. No JVD present.  Cardiovascular: Normal rate, regular rhythm, S1 normal, S2 normal, normal heart sounds and intact distal pulses.  Exam reveals no gallop and no friction rub.   No murmur heard. Pulmonary/Chest: Effort normal and breath sounds normal. No respiratory distress. She has no wheezes. She has no rales. She exhibits no tenderness.  Abdominal: Soft. Bowel sounds are normal. She exhibits no distension. There is no tenderness.  Musculoskeletal: Normal range of motion. She exhibits  no edema and no tenderness.  Lymphadenopathy:    She has no cervical adenopathy.  Neurological: She is alert and oriented to person, place, and time. Coordination normal.  Skin: Skin is warm and dry. No rash noted. No erythema.  Psychiatric: She has a normal mood and affect. Her behavior is normal. Judgment and thought content normal.         Assessment and Plan

## 2010-11-25 ENCOUNTER — Ambulatory Visit (INDEPENDENT_AMBULATORY_CARE_PROVIDER_SITE_OTHER): Payer: Medicare Other | Admitting: Internal Medicine

## 2010-11-25 DIAGNOSIS — Z7901 Long term (current) use of anticoagulants: Secondary | ICD-10-CM

## 2010-11-25 DIAGNOSIS — I4891 Unspecified atrial fibrillation: Secondary | ICD-10-CM

## 2010-11-25 DIAGNOSIS — Z5181 Encounter for therapeutic drug level monitoring: Secondary | ICD-10-CM

## 2010-11-25 NOTE — Patient Instructions (Signed)
Continue current dose, check in 4 weeks  

## 2010-12-02 ENCOUNTER — Encounter: Payer: Self-pay | Admitting: Internal Medicine

## 2010-12-02 ENCOUNTER — Ambulatory Visit (INDEPENDENT_AMBULATORY_CARE_PROVIDER_SITE_OTHER): Payer: Medicare Other | Admitting: Internal Medicine

## 2010-12-02 VITALS — BP 125/71 | HR 85 | Ht 62.0 in | Wt 137.0 lb

## 2010-12-02 DIAGNOSIS — I10 Essential (primary) hypertension: Secondary | ICD-10-CM

## 2010-12-02 DIAGNOSIS — I495 Sick sinus syndrome: Secondary | ICD-10-CM

## 2010-12-02 DIAGNOSIS — I4891 Unspecified atrial fibrillation: Secondary | ICD-10-CM

## 2010-12-02 DIAGNOSIS — Z950 Presence of cardiac pacemaker: Secondary | ICD-10-CM

## 2010-12-02 NOTE — Assessment & Plan Note (Signed)
The patient's device was interrogated.  The information was reviewed. No changes were made in the programming.    

## 2010-12-02 NOTE — Assessment & Plan Note (Signed)
Will check BMET and Mg  because of sotalol and diuretic

## 2010-12-02 NOTE — Assessment & Plan Note (Signed)
Intermittent AF but only 0.3% so even though a little fast, not frequent enough to change meds Continue coumadin

## 2010-12-02 NOTE — Assessment & Plan Note (Signed)
Heart rate excuresion much improved

## 2010-12-02 NOTE — Progress Notes (Signed)
  HPI  Amanda [name] is a 77 y.o. female  seen in followup with a previiously implanted pacemaker in 2007 for syncope and   apparently tachybradycardia syndrome  Her history of paroxysmal atrial fibrillation managed with sotalol  No recent K/Mg on chart  The patient denies SOB, chest pain edema or palpitations.  There has been no syncope or presyncope. .  Past Medical History  Diagnosis Date  . Atrial fibrillation   . Hyperlipidemia   . Hypertension   . Hypothyroidism     after Rx for thyroid cancer  . Osteoarthritis   . Sleep disturbance   . Urinary incontinence     usually from UTI's  . Mitral regurgitation   . Depression   . Pacemaker     Past Surgical History  Procedure Date  . Tonsillectomy and adenoidectomy 1937  . Vesicovaginal fistula closure w/ tah 1972  . Goiter removed 1976    cancerous. Then got RAI  . Breast lumpectomy 1983  . Breast biopsy 2002  . Pacemaker insertion 5/07    after syncope  . Cataract extraction, bilateral     Current Outpatient Prescriptions  Medication Sig Dispense Refill  . acetaminophen (TYLENOL) 500 MG tablet Take 500 mg by mouth as needed.        . hydrochlorothiazide 25 MG tablet Take 25 mg by mouth daily.        Marland Kitchen levothyroxine (SYNTHROID, LEVOTHROID) 50 MCG tablet Take 50 mcg by mouth daily.       Marland Kitchen lisinopril (PRINIVIL,ZESTRIL) 40 MG tablet Take 40 mg by mouth daily.        . mirtazapine (REMERON) 15 MG tablet TAKE 1 TABLET BY MOUTH AT BEDTIME  30 tablet  6  . nitroGLYCERIN (NITROSTAT) 0.4 MG SL tablet Place 0.4 mg under the tongue every 5 (five) minutes as needed.        . sotalol (BETAPACE) 120 MG tablet Take 120 mg by mouth 2 (two) times daily.        Marland Kitchen warfarin (COUMADIN) 4 MG tablet Take one to two daily as directed.  60 tablet  1    Allergies  Allergen Reactions  . Sulfonamide Derivatives     Review of Systems negative except from HPI and PMH  Physical Exam Well developed and well nourished older caucasian female in  no acute distress HENT normal E scleral and icterus clear Neck Supple JVP flat; Clear to ausculation Regular rate and rhythm, no murmurs gallops or rub Soft with active bowel sounds No clubbing cyanosis and edema Alert and oriented, grossly normal motor and sensory function Skin Warm and Dry  Assessment and  Plan

## 2010-12-02 NOTE — Patient Instructions (Signed)
You are doing well. No medication changes were made. Please call us if you have new issues that need to be addressed before your next appt.  We will call you for a follow up Appt. In 12 months  

## 2010-12-09 ENCOUNTER — Encounter: Payer: Self-pay | Admitting: Internal Medicine

## 2010-12-09 ENCOUNTER — Ambulatory Visit (INDEPENDENT_AMBULATORY_CARE_PROVIDER_SITE_OTHER): Payer: Medicare Other | Admitting: Internal Medicine

## 2010-12-09 DIAGNOSIS — F329 Major depressive disorder, single episode, unspecified: Secondary | ICD-10-CM

## 2010-12-09 DIAGNOSIS — I10 Essential (primary) hypertension: Secondary | ICD-10-CM

## 2010-12-09 DIAGNOSIS — I4891 Unspecified atrial fibrillation: Secondary | ICD-10-CM

## 2010-12-09 DIAGNOSIS — G479 Sleep disorder, unspecified: Secondary | ICD-10-CM

## 2010-12-09 LAB — BASIC METABOLIC PANEL
BUN: 29 mg/dL — ABNORMAL HIGH (ref 6–23)
Chloride: 102 mEq/L (ref 96–112)
Glucose, Bld: 96 mg/dL (ref 70–99)
Potassium: 3.7 mEq/L (ref 3.5–5.1)

## 2010-12-09 NOTE — Progress Notes (Signed)
Subjective:    Patient ID: Amanda [name], female    DOB: 11-May-1919, 77 y.o.   MRN: [id number]  HPI Here with daughter as usual Doing fairly well Recent visit with Dr Caryl Comes. No problems Due for met-b and magnesium  Occ brief feeling of heart beating---mostly when she lies down at night Did have 1 hour atrial fib episode in April but she wasn't aware of it No chest pain No SOB Occ mild edema--end of the day and generally gone in the morning  Takes a while to initiate sleep--doesn't fall asleep till after midnight Tries tylenol Some degree of restless legs Still on mirtazapine No sig depressed mood. Enjoys sleep, reading, walks some. Does get together with friends  Current Outpatient Prescriptions on File Prior to Visit  Medication Sig Dispense Refill  . acetaminophen (TYLENOL) 500 MG tablet Take 500 mg by mouth as needed.        . hydrochlorothiazide 25 MG tablet Take 25 mg by mouth daily.        Marland Kitchen levothyroxine (SYNTHROID, LEVOTHROID) 50 MCG tablet Take 50 mcg by mouth daily.       Marland Kitchen lisinopril (PRINIVIL,ZESTRIL) 40 MG tablet Take 40 mg by mouth daily.        . mirtazapine (REMERON) 15 MG tablet TAKE 1 TABLET BY MOUTH AT BEDTIME  30 tablet  6  . nitroGLYCERIN (NITROSTAT) 0.4 MG SL tablet Place 0.4 mg under the tongue every 5 (five) minutes as needed.        . sotalol (BETAPACE) 120 MG tablet Take 120 mg by mouth 2 (two) times daily.        Marland Kitchen warfarin (COUMADIN) 4 MG tablet Take one to two daily as directed.  60 tablet  1    Allergies  Allergen Reactions  . Sulfonamide Derivatives     Past Medical History  Diagnosis Date  . Atrial fibrillation   . Hyperlipidemia   . Hypertension   . Hypothyroidism     after Rx for thyroid cancer  . Osteoarthritis   . Sleep disturbance   . Urinary incontinence     usually from UTI's  . Mitral regurgitation   . Depression   . Pacemaker     Past Surgical History  Procedure Date  . Tonsillectomy and adenoidectomy 1937  .  Vesicovaginal fistula closure w/ tah 1972  . Goiter removed 1976    cancerous. Then got RAI  . Breast lumpectomy 1983  . Breast biopsy 2002  . Pacemaker insertion 5/07    after syncope  . Cataract extraction, bilateral     Family History  Problem Relation Age of Onset  . Diabetes Mother   . Stroke Sister   . Coronary artery disease Brother   . Prostate cancer Brother   . Breast cancer Neg Hx   . Colon cancer Neg Hx     History   Social History  . Marital Status: Widowed    Spouse Name: N/A    Number of Children: 2  . Years of Education: N/A   Occupational History  . homemaker    Social History Main Topics  . Smoking status: Never Smoker   . Smokeless tobacco: Never Used  . Alcohol Use: No  . Drug Use: No  . Sexually Active: Not on file   Other Topics Concern  . Not on file   Social History Narrative   Widowed 2002. 1 local daughter. Other in [place]. Was homemaker. Farmed and gardened. 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 Daughter [name]) is  health care POA. Discussed DNR and she requests would not want tube feeds   Review of Systems Appetite is good Weight is stable     Objective:   Physical Exam  Constitutional: She appears well-developed and well-nourished. No distress.  Neck: Normal range of motion. Neck supple. No thyromegaly present.  Cardiovascular: Normal rate, regular rhythm and normal heart sounds.  Exam reveals no gallop.   No murmur heard. Pulmonary/Chest: Effort normal and breath sounds normal. No respiratory distress. She has no wheezes. She has no rales.  Musculoskeletal: Normal range of motion. She exhibits no edema and no tenderness.  Lymphadenopathy:    She has no cervical adenopathy.  Psychiatric: She has a normal mood and affect. Her behavior is normal. Judgment and thought content normal.          Assessment & Plan:

## 2010-12-09 NOTE — Assessment & Plan Note (Signed)
Uncommon On the coumadin Will check labs on sotalol

## 2010-12-09 NOTE — Assessment & Plan Note (Signed)
Lab Results  Component Value Date   CREATININE 1.2 06/10/2010   BP Readings from Last 3 Encounters:  12/09/10 130/64  12/02/10 125/71  11/18/10 146/80   Good control No changes needed

## 2010-12-09 NOTE — Assessment & Plan Note (Signed)
Still trouble with initiation No changes for now as she does get enough sleep

## 2010-12-09 NOTE — Assessment & Plan Note (Signed)
Very private person but gets drawn into some social events by friends Satisfied Will continue the mirtazapine

## 2010-12-23 ENCOUNTER — Ambulatory Visit (INDEPENDENT_AMBULATORY_CARE_PROVIDER_SITE_OTHER): Payer: Medicare Other | Admitting: Internal Medicine

## 2010-12-23 DIAGNOSIS — Z7901 Long term (current) use of anticoagulants: Secondary | ICD-10-CM

## 2010-12-23 DIAGNOSIS — Z5181 Encounter for therapeutic drug level monitoring: Secondary | ICD-10-CM

## 2010-12-23 DIAGNOSIS — I4891 Unspecified atrial fibrillation: Secondary | ICD-10-CM

## 2010-12-23 LAB — POCT INR: INR: 1.9

## 2010-12-23 NOTE — Patient Instructions (Signed)
Continue current dose, check in 4 weeks  

## 2011-01-20 ENCOUNTER — Ambulatory Visit (INDEPENDENT_AMBULATORY_CARE_PROVIDER_SITE_OTHER): Payer: Medicare Other

## 2011-01-20 ENCOUNTER — Ambulatory Visit (INDEPENDENT_AMBULATORY_CARE_PROVIDER_SITE_OTHER): Payer: Medicare Other | Admitting: Internal Medicine

## 2011-01-20 DIAGNOSIS — Z7901 Long term (current) use of anticoagulants: Secondary | ICD-10-CM

## 2011-01-20 DIAGNOSIS — Z5181 Encounter for therapeutic drug level monitoring: Secondary | ICD-10-CM

## 2011-01-20 DIAGNOSIS — Z23 Encounter for immunization: Secondary | ICD-10-CM

## 2011-01-20 DIAGNOSIS — I4891 Unspecified atrial fibrillation: Secondary | ICD-10-CM

## 2011-01-20 NOTE — Patient Instructions (Signed)
4 mg daily, 2 mg T,TH,SAT, recheck 4 weeks

## 2011-02-11 ENCOUNTER — Other Ambulatory Visit: Payer: Self-pay | Admitting: *Deleted

## 2011-02-11 MED ORDER — MIRTAZAPINE 15 MG PO TABS: ORAL_TABLET | ORAL | Status: DC

## 2011-02-11 NOTE — Telephone Encounter (Signed)
Pt's daughter is asking for a written script for mail order.  Please call when ready and she will pick up.

## 2011-02-11 NOTE — Telephone Encounter (Signed)
Spoke with daughter and advised results.  

## 2011-02-17 ENCOUNTER — Ambulatory Visit (INDEPENDENT_AMBULATORY_CARE_PROVIDER_SITE_OTHER): Payer: Medicare Other | Admitting: Internal Medicine

## 2011-02-17 DIAGNOSIS — I4891 Unspecified atrial fibrillation: Secondary | ICD-10-CM

## 2011-02-17 DIAGNOSIS — Z7901 Long term (current) use of anticoagulants: Secondary | ICD-10-CM

## 2011-02-17 DIAGNOSIS — Z5181 Encounter for therapeutic drug level monitoring: Secondary | ICD-10-CM

## 2011-02-17 LAB — POCT INR: INR: 2.6

## 2011-02-17 NOTE — Patient Instructions (Signed)
Continue current dose, check in 4 weeks  

## 2011-03-03 ENCOUNTER — Other Ambulatory Visit: Payer: Self-pay | Admitting: Internal Medicine

## 2011-03-03 ENCOUNTER — Ambulatory Visit (INDEPENDENT_AMBULATORY_CARE_PROVIDER_SITE_OTHER): Payer: Medicare Other | Admitting: *Deleted

## 2011-03-03 ENCOUNTER — Encounter: Payer: Self-pay | Admitting: Internal Medicine

## 2011-03-03 DIAGNOSIS — I495 Sick sinus syndrome: Secondary | ICD-10-CM

## 2011-03-03 DIAGNOSIS — Z950 Presence of cardiac pacemaker: Secondary | ICD-10-CM

## 2011-03-03 DIAGNOSIS — I4891 Unspecified atrial fibrillation: Secondary | ICD-10-CM

## 2011-03-04 ENCOUNTER — Encounter: Payer: Medicare Other | Admitting: *Deleted

## 2011-03-07 LAB — REMOTE PACEMAKER DEVICE
ATRIAL PACING PM: 98
BAMS-0001: 175 {beats}/min
RV LEAD AMPLITUDE: 8 mv
RV LEAD IMPEDENCE PM: 466 Ohm
RV LEAD THRESHOLD: 0.75 V

## 2011-03-10 NOTE — Progress Notes (Signed)
Remote pacer check  

## 2011-03-17 ENCOUNTER — Ambulatory Visit (INDEPENDENT_AMBULATORY_CARE_PROVIDER_SITE_OTHER): Payer: Medicare Other | Admitting: Internal Medicine

## 2011-03-17 DIAGNOSIS — Z5181 Encounter for therapeutic drug level monitoring: Secondary | ICD-10-CM

## 2011-03-17 DIAGNOSIS — I4891 Unspecified atrial fibrillation: Secondary | ICD-10-CM

## 2011-03-17 DIAGNOSIS — Z7901 Long term (current) use of anticoagulants: Secondary | ICD-10-CM

## 2011-03-17 LAB — POCT INR: INR: 2.7

## 2011-03-17 NOTE — Patient Instructions (Signed)
Continue current dose, check in 4 weeks  

## 2011-03-25 ENCOUNTER — Encounter: Payer: Self-pay | Admitting: *Deleted

## 2011-04-10 ENCOUNTER — Other Ambulatory Visit: Payer: Self-pay | Admitting: Internal Medicine

## 2011-04-14 ENCOUNTER — Ambulatory Visit (INDEPENDENT_AMBULATORY_CARE_PROVIDER_SITE_OTHER): Payer: Medicare Other | Admitting: Internal Medicine

## 2011-04-14 DIAGNOSIS — I4891 Unspecified atrial fibrillation: Secondary | ICD-10-CM

## 2011-04-14 DIAGNOSIS — Z5181 Encounter for therapeutic drug level monitoring: Secondary | ICD-10-CM

## 2011-04-14 DIAGNOSIS — Z7901 Long term (current) use of anticoagulants: Secondary | ICD-10-CM

## 2011-04-14 LAB — POCT INR: INR: 2.1

## 2011-04-14 NOTE — Patient Instructions (Signed)
Continue 4 mg daily, 2 mg T,TH,SAT, recheck 4 weeks

## 2011-04-19 ENCOUNTER — Other Ambulatory Visit: Payer: Self-pay | Admitting: Internal Medicine

## 2011-04-19 MED ORDER — WARFARIN SODIUM 4 MG PO TABS: ORAL_TABLET | ORAL | Status: DC

## 2011-04-19 NOTE — Telephone Encounter (Signed)
Faxed refill to pharmacy

## 2011-05-05 DIAGNOSIS — H903 Sensorineural hearing loss, bilateral: Secondary | ICD-10-CM | POA: Diagnosis not present

## 2011-05-05 DIAGNOSIS — R0982 Postnasal drip: Secondary | ICD-10-CM | POA: Diagnosis not present

## 2011-05-05 DIAGNOSIS — H6120 Impacted cerumen, unspecified ear: Secondary | ICD-10-CM | POA: Diagnosis not present

## 2011-05-11 ENCOUNTER — Other Ambulatory Visit: Payer: Self-pay | Admitting: *Deleted

## 2011-05-11 MED ORDER — LEVOTHYROXINE SODIUM 50 MCG PO TABS: 50.0000 ug | ORAL_TABLET | Freq: Every day | ORAL | Status: DC

## 2011-05-12 ENCOUNTER — Ambulatory Visit (INDEPENDENT_AMBULATORY_CARE_PROVIDER_SITE_OTHER): Payer: Medicare Other | Admitting: Internal Medicine

## 2011-05-12 DIAGNOSIS — Z7901 Long term (current) use of anticoagulants: Secondary | ICD-10-CM

## 2011-05-12 DIAGNOSIS — I4891 Unspecified atrial fibrillation: Secondary | ICD-10-CM

## 2011-05-12 DIAGNOSIS — Z5181 Encounter for therapeutic drug level monitoring: Secondary | ICD-10-CM | POA: Diagnosis not present

## 2011-05-12 NOTE — Patient Instructions (Signed)
Continue 4 mg daily, 2 mg T,TH,SAT, recheck 4 weeks

## 2011-05-19 ENCOUNTER — Telehealth: Payer: Self-pay | Admitting: Internal Medicine

## 2011-05-19 MED ORDER — SOTALOL HCL 120 MG PO TABS: 120.0000 mg | ORAL_TABLET | Freq: Two times a day (BID) | ORAL | Status: DC

## 2011-05-19 MED ORDER — LISINOPRIL 40 MG PO TABS: 40.0000 mg | ORAL_TABLET | Freq: Every day | ORAL | Status: DC

## 2011-05-19 MED ORDER — HYDROCHLOROTHIAZIDE 25 MG PO TABS: 25.0000 mg | ORAL_TABLET | Freq: Every day | ORAL | Status: DC

## 2011-05-19 NOTE — Telephone Encounter (Signed)
rx sent to pharmacy and patients daughter for lunch

## 2011-05-19 NOTE — Telephone Encounter (Signed)
Daughter states that only one refill was filled by [facility] and is requesting that the others be sent to [facility] for refilling.  Patient still needs Hydrochlorothiazide, Lisinopril, and Sotalol called for a refill.  The number for [facility] is [phone].  The daughter says that the patient is running out of medications and is in critical need of refills.

## 2011-05-20 ENCOUNTER — Other Ambulatory Visit: Payer: Self-pay | Admitting: *Deleted

## 2011-05-20 MED ORDER — HYDROCHLOROTHIAZIDE 25 MG PO TABS: 25.0000 mg | ORAL_TABLET | Freq: Every day | ORAL | Status: DC

## 2011-05-26 ENCOUNTER — Other Ambulatory Visit: Payer: Self-pay | Admitting: *Deleted

## 2011-05-26 MED ORDER — SOTALOL HCL 120 MG PO TABS: 120.0000 mg | ORAL_TABLET | Freq: Two times a day (BID) | ORAL | Status: DC

## 2011-05-26 NOTE — Telephone Encounter (Signed)
Patient needed 30 day supply of Sotalol until her mail order arrives. rx sent to pharmacy by e-script

## 2011-06-03 ENCOUNTER — Ambulatory Visit (INDEPENDENT_AMBULATORY_CARE_PROVIDER_SITE_OTHER): Payer: Medicare Other | Admitting: *Deleted

## 2011-06-03 ENCOUNTER — Encounter: Payer: Self-pay | Admitting: Internal Medicine

## 2011-06-03 DIAGNOSIS — I495 Sick sinus syndrome: Secondary | ICD-10-CM

## 2011-06-06 LAB — REMOTE PACEMAKER DEVICE
AL IMPEDENCE PM: 736 Ohm
AL THRESHOLD: 0.75 V
ATRIAL PACING PM: 98
BAMS-0001: 175 {beats}/min
BATTERY VOLTAGE: 2.75 V
RV LEAD AMPLITUDE: 8 mv
RV LEAD IMPEDENCE PM: 459 Ohm
RV LEAD THRESHOLD: 0.875 V
VENTRICULAR PACING PM: 2

## 2011-06-08 ENCOUNTER — Ambulatory Visit: Payer: Medicare Other | Admitting: Internal Medicine

## 2011-06-11 NOTE — Progress Notes (Signed)
PPM remote 

## 2011-06-16 ENCOUNTER — Encounter: Payer: Self-pay | Admitting: *Deleted

## 2011-06-16 ENCOUNTER — Ambulatory Visit: Payer: Medicare Other

## 2011-06-17 ENCOUNTER — Ambulatory Visit (INDEPENDENT_AMBULATORY_CARE_PROVIDER_SITE_OTHER): Payer: Medicare Other | Admitting: Internal Medicine

## 2011-06-17 DIAGNOSIS — Z5181 Encounter for therapeutic drug level monitoring: Secondary | ICD-10-CM | POA: Diagnosis not present

## 2011-06-17 DIAGNOSIS — I4891 Unspecified atrial fibrillation: Secondary | ICD-10-CM | POA: Diagnosis not present

## 2011-06-17 DIAGNOSIS — Z7901 Long term (current) use of anticoagulants: Secondary | ICD-10-CM

## 2011-06-17 NOTE — Patient Instructions (Signed)
Hold today's dose only, then  4 mg daily, 2 mg T,TH,SAT,check 1 week after seeing Dr Silvio Pate

## 2011-06-22 ENCOUNTER — Ambulatory Visit (INDEPENDENT_AMBULATORY_CARE_PROVIDER_SITE_OTHER): Payer: Medicare Other | Admitting: Internal Medicine

## 2011-06-22 ENCOUNTER — Encounter: Payer: Self-pay | Admitting: Internal Medicine

## 2011-06-22 VITALS — BP 140/80 | HR 82 | Temp 98.6°F | Ht 62.0 in | Wt 133.0 lb

## 2011-06-22 DIAGNOSIS — E785 Hyperlipidemia, unspecified: Secondary | ICD-10-CM

## 2011-06-22 DIAGNOSIS — I4891 Unspecified atrial fibrillation: Secondary | ICD-10-CM | POA: Diagnosis not present

## 2011-06-22 DIAGNOSIS — I10 Essential (primary) hypertension: Secondary | ICD-10-CM | POA: Diagnosis not present

## 2011-06-22 DIAGNOSIS — F329 Major depressive disorder, single episode, unspecified: Secondary | ICD-10-CM | POA: Diagnosis not present

## 2011-06-22 DIAGNOSIS — Z5181 Encounter for therapeutic drug level monitoring: Secondary | ICD-10-CM

## 2011-06-22 DIAGNOSIS — E039 Hypothyroidism, unspecified: Secondary | ICD-10-CM | POA: Diagnosis not present

## 2011-06-22 DIAGNOSIS — Z7901 Long term (current) use of anticoagulants: Secondary | ICD-10-CM | POA: Diagnosis not present

## 2011-06-22 LAB — TSH: TSH: 6.88 u[IU]/mL — ABNORMAL HIGH (ref 0.35–5.50)

## 2011-06-22 LAB — CBC WITH DIFFERENTIAL/PLATELET
Basophils Absolute: 0 10*3/uL (ref 0.0–0.1)
Eosinophils Absolute: 0.2 10*3/uL (ref 0.0–0.7)
MCHC: 33.5 g/dL (ref 30.0–36.0)
MCV: 89.3 fl (ref 78.0–100.0)
Monocytes Absolute: 0.7 10*3/uL (ref 0.1–1.0)
Neutrophils Relative %: 53.4 % (ref 43.0–77.0)
Platelets: 200 10*3/uL (ref 150.0–400.0)

## 2011-06-22 LAB — HEPATIC FUNCTION PANEL: Total Bilirubin: 0.1 mg/dL — ABNORMAL LOW (ref 0.3–1.2)

## 2011-06-22 LAB — BASIC METABOLIC PANEL
BUN: 33 mg/dL — ABNORMAL HIGH (ref 6–23)
CO2: 30 mEq/L (ref 19–32)
Chloride: 97 mEq/L (ref 96–112)
Creatinine, Ser: 1.2 mg/dL (ref 0.4–1.2)

## 2011-06-22 LAB — LIPID PANEL
Cholesterol: 391 mg/dL — ABNORMAL HIGH (ref 0–200)
Total CHOL/HDL Ratio: 6
VLDL: 59.2 mg/dL — ABNORMAL HIGH (ref 0.0–40.0)

## 2011-06-22 LAB — MAGNESIUM: Magnesium: 1.8 mg/dL (ref 1.5–2.5)

## 2011-06-22 LAB — POCT INR: INR: 1.9

## 2011-06-22 NOTE — Assessment & Plan Note (Signed)
Due for labs

## 2011-06-22 NOTE — Assessment & Plan Note (Signed)
Has occ spells Did have clear vasovagal syncope last week---had to move bowels during a fib spell Asked her to stay down when she has the atrial fib

## 2011-06-22 NOTE — Patient Instructions (Signed)
resume  4 mg daily, 2 mg T,TH,SAT,check 2 weeks

## 2011-06-22 NOTE — Assessment & Plan Note (Signed)
No Rx for this 

## 2011-06-22 NOTE — Assessment & Plan Note (Signed)
still a loner but has been getting out socially more at [facility] of late

## 2011-06-22 NOTE — Assessment & Plan Note (Signed)
BP Readings from Last 3 Encounters:  06/22/11 140/80  12/09/10 130/64  12/02/10 125/71   Good control No changes

## 2011-06-22 NOTE — Progress Notes (Signed)
Subjective:    Patient ID: Amanda [name], female    DOB: [birth year], 78 y.o.   MRN: [id number]  HPI Here with daughter Doesn't drive ---ever Daughter brings her to grocery store Does her own cooking, daughter brings some food as well Does all her own housework  Did faint last week Multiple visits to bathroom with loose stools Didn't loose conciousness ---hit left forehead Heart was going fast for about 20 minutes Daughter assessed her right after and stayed with her for the rest of that day  Has cane but doesn't use it Likes the support of the shopping cart in the grocery store---discussed a walker Walks regularly for exercise  Some arthritis pain at times Uses extra strength tylenol at night Generally doesn't use it in daytime  No chest pain No SOB Current Outpatient Prescriptions on File Prior to Visit  Medication Sig Dispense Refill  . acetaminophen (TYLENOL) 500 MG tablet Take 500 mg by mouth as needed.        . hydrochlorothiazide (HYDRODIURIL) 25 MG tablet Take 1 tablet (25 mg total) by mouth daily.  90 tablet  3  . levothyroxine (SYNTHROID, LEVOTHROID) 50 MCG tablet Take 1 tablet (50 mcg total) by mouth daily.  90 tablet  3  . lisinopril (PRINIVIL,ZESTRIL) 40 MG tablet Take 1 tablet (40 mg total) by mouth daily.  90 tablet  3  . mirtazapine (REMERON) 15 MG tablet Take one tablet by mouth at bedtime.  90 tablet  3  . nitroGLYCERIN (NITROSTAT) 0.4 MG SL tablet Place 0.4 mg under the tongue every 5 (five) minutes as needed.        . sotalol (BETAPACE) 120 MG tablet Take 1 tablet (120 mg total) by mouth 2 (two) times daily.  30 tablet  0  . warfarin (COUMADIN) 4 MG tablet Take one to two daily as directed.  60 tablet  1    Allergies  Allergen Reactions  . Sulfonamide Derivatives     Past Medical History  Diagnosis Date  . Atrial fibrillation   . Hyperlipidemia   . Hypertension   . Hypothyroidism     after Rx for thyroid cancer  . Osteoarthritis   . Sleep  disturbance   . Urinary incontinence     usually from UTI's  . Mitral regurgitation   . Depression   . Pacemaker     Past Surgical History  Procedure Date  . Tonsillectomy and adenoidectomy 1937  . Vesicovaginal fistula closure w/ tah 1972  . Goiter removed 1976    cancerous. Then got RAI  . Breast lumpectomy 1983  . Breast biopsy 2002  . Pacemaker insertion 5/07    after syncope  . Cataract extraction, bilateral     Family History  Problem Relation Age of Onset  . Diabetes Mother   . Stroke Sister   . Coronary artery disease Brother   . Prostate cancer Brother   . Breast cancer Neg Hx   . Colon cancer Neg Hx     History   Social History  . Marital Status: Widowed    Spouse Name: N/A    Number of Children: 2  . Years of Education: N/A   Occupational History  . homemaker    Social History Main Topics  . Smoking status: Never Smoker   . Smokeless tobacco: Never Used  . Alcohol Use: No  . Drug Use: No  . Sexually Active: Not on file   Other Topics Concern  . Not on file  Social History Narrative   Widowed 2002. 1 local daughter. Other in [place]. Was homemaker. Farmed and gardened. 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 Daughter [name]) is health care POA. Discussed DNR and she requests would not want tube feeds   Review of Systems Never sleeps well--no real change Occ naps in day at times Appetite is good Weight is stable    Objective:   Physical Exam  Constitutional: She appears well-developed and well-nourished. No distress.  Neck: Normal range of motion. Neck supple.  Cardiovascular: Normal rate, regular rhythm and normal heart sounds.  Exam reveals no gallop.   No murmur heard. Pulmonary/Chest: Effort normal and breath sounds normal. No respiratory distress. She has no wheezes. She has no rales.  Abdominal: Soft. There is no tenderness.  Musculoskeletal: She exhibits no edema and no tenderness.  Lymphadenopathy:    She has no cervical adenopathy.    Psychiatric: She has a normal mood and affect. Her behavior is normal. Thought content normal.          Assessment & Plan:

## 2011-07-01 DIAGNOSIS — H179 Unspecified corneal scar and opacity: Secondary | ICD-10-CM | POA: Diagnosis not present

## 2011-07-05 ENCOUNTER — Other Ambulatory Visit: Payer: Self-pay | Admitting: Internal Medicine

## 2011-07-06 ENCOUNTER — Ambulatory Visit: Payer: Medicare Other

## 2011-07-07 ENCOUNTER — Ambulatory Visit (INDEPENDENT_AMBULATORY_CARE_PROVIDER_SITE_OTHER): Payer: Medicare Other | Admitting: Internal Medicine

## 2011-07-07 ENCOUNTER — Encounter: Payer: Self-pay | Admitting: *Deleted

## 2011-07-07 DIAGNOSIS — I4891 Unspecified atrial fibrillation: Secondary | ICD-10-CM | POA: Diagnosis not present

## 2011-07-07 DIAGNOSIS — Z7901 Long term (current) use of anticoagulants: Secondary | ICD-10-CM

## 2011-07-07 DIAGNOSIS — Z5181 Encounter for therapeutic drug level monitoring: Secondary | ICD-10-CM

## 2011-07-07 LAB — POCT INR: INR: 2.7

## 2011-07-07 NOTE — Patient Instructions (Signed)
Continue current dose, check in 4 weeks  

## 2011-07-15 ENCOUNTER — Ambulatory Visit: Payer: Medicare Other

## 2011-08-04 ENCOUNTER — Ambulatory Visit (INDEPENDENT_AMBULATORY_CARE_PROVIDER_SITE_OTHER): Payer: Medicare Other | Admitting: Internal Medicine

## 2011-08-04 DIAGNOSIS — I4891 Unspecified atrial fibrillation: Secondary | ICD-10-CM | POA: Diagnosis not present

## 2011-08-04 DIAGNOSIS — Z5181 Encounter for therapeutic drug level monitoring: Secondary | ICD-10-CM

## 2011-08-04 DIAGNOSIS — Z7901 Long term (current) use of anticoagulants: Secondary | ICD-10-CM | POA: Diagnosis not present

## 2011-08-04 NOTE — Patient Instructions (Signed)
Continue current dose, check in 4 weeks  

## 2011-08-09 DIAGNOSIS — M79609 Pain in unspecified limb: Secondary | ICD-10-CM | POA: Diagnosis not present

## 2011-08-09 DIAGNOSIS — B351 Tinea unguium: Secondary | ICD-10-CM | POA: Diagnosis not present

## 2011-09-02 ENCOUNTER — Ambulatory Visit (INDEPENDENT_AMBULATORY_CARE_PROVIDER_SITE_OTHER): Payer: Medicare Other | Admitting: *Deleted

## 2011-09-02 DIAGNOSIS — I4891 Unspecified atrial fibrillation: Secondary | ICD-10-CM | POA: Diagnosis not present

## 2011-09-02 DIAGNOSIS — Z950 Presence of cardiac pacemaker: Secondary | ICD-10-CM | POA: Diagnosis not present

## 2011-09-03 ENCOUNTER — Encounter: Payer: Self-pay | Admitting: Internal Medicine

## 2011-09-03 ENCOUNTER — Ambulatory Visit (INDEPENDENT_AMBULATORY_CARE_PROVIDER_SITE_OTHER): Payer: Medicare Other | Admitting: Internal Medicine

## 2011-09-03 DIAGNOSIS — Z5181 Encounter for therapeutic drug level monitoring: Secondary | ICD-10-CM | POA: Diagnosis not present

## 2011-09-03 DIAGNOSIS — Z7901 Long term (current) use of anticoagulants: Secondary | ICD-10-CM | POA: Diagnosis not present

## 2011-09-03 LAB — POCT INR: INR: 2.4

## 2011-09-03 NOTE — Patient Instructions (Signed)
Continue  4 mg daily, 2 mg T,TH,SAT, recheck 4 weeks

## 2011-09-06 LAB — REMOTE PACEMAKER DEVICE
AL IMPEDENCE PM: 764 Ohm
AL THRESHOLD: 0.75 V
BATTERY VOLTAGE: 2.75 V
RV LEAD AMPLITUDE: 8 mv
RV LEAD THRESHOLD: 0.75 V

## 2011-09-08 ENCOUNTER — Telehealth: Payer: Self-pay

## 2011-09-08 NOTE — Telephone Encounter (Signed)
If the area is not too big, the dermatologist may just try freezing it. No change in coumadin is needed If needs to be excised, will probably make another appt (and sometimes the coumadin doesn't need to be stopped anyway)

## 2011-09-08 NOTE — Telephone Encounter (Signed)
Pt's daughter, Amanda [name] said noticed ? Skin cancer; reddened irritated 1/2" x 1/4" area which is itching and swollen on ? Whether right or left ear. Pt lives at [facility] and has appt to see dermatologist on 09/10/11. Amanda [name] said she is not sure if dermatologist will do anything to area on Friday but does not know what to do about stopping pts coumadin. Please advise. Amanda [name] can be reached at [phone]. Pt uses [facility] if pharmacy needed.

## 2011-09-08 NOTE — Telephone Encounter (Signed)
Spoke with daughter and advised results.  

## 2011-09-10 DIAGNOSIS — D485 Neoplasm of uncertain behavior of skin: Secondary | ICD-10-CM | POA: Diagnosis not present

## 2011-09-10 DIAGNOSIS — L819 Disorder of pigmentation, unspecified: Secondary | ICD-10-CM | POA: Diagnosis not present

## 2011-09-10 DIAGNOSIS — L723 Sebaceous cyst: Secondary | ICD-10-CM | POA: Diagnosis not present

## 2011-09-13 ENCOUNTER — Emergency Department: Payer: Self-pay | Admitting: Emergency Medicine

## 2011-09-13 ENCOUNTER — Telehealth: Payer: Self-pay | Admitting: Physician Assistant

## 2011-09-13 DIAGNOSIS — R0789 Other chest pain: Secondary | ICD-10-CM | POA: Diagnosis not present

## 2011-09-13 DIAGNOSIS — R079 Chest pain, unspecified: Secondary | ICD-10-CM | POA: Diagnosis not present

## 2011-09-13 DIAGNOSIS — R52 Pain, unspecified: Secondary | ICD-10-CM | POA: Diagnosis not present

## 2011-09-13 LAB — BASIC METABOLIC PANEL
BUN: 30 mg/dL — ABNORMAL HIGH (ref 7–18)
Calcium, Total: 10.1 mg/dL (ref 8.5–10.1)
Co2: 33 mmol/L — ABNORMAL HIGH (ref 21–32)
Glucose: 102 mg/dL — ABNORMAL HIGH (ref 65–99)
Osmolality: 286 (ref 275–301)
Potassium: 3.9 mmol/L (ref 3.5–5.1)
Sodium: 140 mmol/L (ref 136–145)

## 2011-09-13 LAB — CBC
HCT: 33.4 % — ABNORMAL LOW (ref 35.0–47.0)
HGB: 11.1 g/dL — ABNORMAL LOW (ref 12.0–16.0)
MCH: 29.4 pg (ref 26.0–34.0)
MCHC: 33.3 g/dL (ref 32.0–36.0)
MCV: 88 fL (ref 80–100)
RBC: 3.79 10*6/uL — ABNORMAL LOW (ref 3.80–5.20)
RDW: 14.9 % — ABNORMAL HIGH (ref 11.5–14.5)
WBC: 5.9 10*3/uL (ref 3.6–11.0)

## 2011-09-13 LAB — CK TOTAL AND CKMB (NOT AT ARMC)
CK, Total: 58 U/L (ref 21–215)
CK, Total: 54 U/L (ref 21–215)
CK-MB: 0.7 ng/mL (ref 0.5–3.6)

## 2011-09-13 LAB — PRO B NATRIURETIC PEPTIDE: B-Type Natriuretic Peptide: 917 pg/mL — ABNORMAL HIGH (ref 0–450)

## 2011-09-13 LAB — PROTIME-INR: INR: 1.9

## 2011-09-13 NOTE — Telephone Encounter (Signed)
Patient's daughter called re: mother c/o chest pressure radiating to bilateral arms. She has a history of atrial fibrillation, and knows when she is in it. This feels different. Has occurred over the past 2 days with exertion initially, now at rest. No shortness of breath, lightheadedness, diaphoresis, n/v, fevers, chills, cough or sick contacts. Patient took Tums without relief. Did not take NTG SL. Advised to take one full-dose ASA and NTG SL to attempt to alleviate chest pain. Advised daughter to have patient to present to the nearest ED () for formal evaluation. She understood and agreed.    Jacquelynn Cree, PA-C 09/13/2011 11:09 AM

## 2011-09-14 ENCOUNTER — Telehealth: Payer: Self-pay | Admitting: Internal Medicine

## 2011-09-14 NOTE — Telephone Encounter (Signed)
thanks

## 2011-09-14 NOTE — Telephone Encounter (Signed)
Caller: [name]/Mother; PCP: Viviana Simpler I.; [id number]; Call regarding Inform Dr. Silvio Pate of [place]  ED Visit 09/13/11 for Chest Pain;  it was not cardiac related.  She is to take Prilosec and follow up if not improved in a few days.  Information noted and sent to provider per PCP Calls, No Triage protocol.

## 2011-09-14 NOTE — Telephone Encounter (Signed)
I faxed a request for records to [facility].

## 2011-09-14 NOTE — Telephone Encounter (Signed)
Please get ER report from [facility]

## 2011-09-16 ENCOUNTER — Encounter: Payer: Self-pay | Admitting: Internal Medicine

## 2011-09-16 ENCOUNTER — Ambulatory Visit (INDEPENDENT_AMBULATORY_CARE_PROVIDER_SITE_OTHER): Payer: Medicare Other | Admitting: Internal Medicine

## 2011-09-16 VITALS — BP 120/80 | HR 78 | Temp 98.4°F | Wt 131.0 lb

## 2011-09-16 DIAGNOSIS — K219 Gastro-esophageal reflux disease without esophagitis: Secondary | ICD-10-CM | POA: Insufficient documentation

## 2011-09-16 NOTE — Progress Notes (Signed)
Subjective:    Patient ID: Amanda [name], female    DOB: 1919-08-30, 78 y.o.   MRN: [id number]  HPI Started with chest pain about 1 week ago When she eats or drinks anything--even water--gets pain substernal and down left arm. Dull ache and pressure Some burning sensation as well Both arms somewhat weak  Seen in ER 4 days ago Records reviewed EKG, blood work okay  Continuous burping Some dizziness  Has had heartburn in past Needed EGD for severe symptoms 10 years ago or so Did take prilosec for some time  Notes improvement today On the prilosec for a few days Current Outpatient Prescriptions on File Prior to Visit  Medication Sig Dispense Refill  . acetaminophen (TYLENOL) 500 MG tablet Take 500 mg by mouth as needed.        . hydrochlorothiazide (HYDRODIURIL) 25 MG tablet Take 1 tablet (25 mg total) by mouth daily.  90 tablet  3  . levothyroxine (SYNTHROID, LEVOTHROID) 50 MCG tablet Take 1 tablet (50 mcg total) by mouth daily.  90 tablet  3  . lisinopril (PRINIVIL,ZESTRIL) 40 MG tablet Take 1 tablet (40 mg total) by mouth daily.  90 tablet  3  . mirtazapine (REMERON) 15 MG tablet Take one tablet by mouth at bedtime.  90 tablet  3  . nitroGLYCERIN (NITROSTAT) 0.4 MG SL tablet Place 0.4 mg under the tongue every 5 (five) minutes as needed.        Marland Kitchen omeprazole (PRILOSEC) 20 MG capsule Take 20 mg by mouth daily.      . sotalol (BETAPACE) 120 MG tablet TAKE 1 TABLET (120 MG TOTAL) BY MOUTH 2 (TWO) TIMES DAILY.  30 tablet  0  . warfarin (COUMADIN) 4 MG tablet Take one to two daily as directed.  60 tablet  1    Allergies  Allergen Reactions  . Sulfonamide Derivatives     Past Medical History  Diagnosis Date  . Atrial fibrillation   . Hyperlipidemia   . Hypertension   . Hypothyroidism     after Rx for thyroid cancer  . Osteoarthritis   . Sleep disturbance   . Urinary incontinence     usually from UTI's  . Mitral regurgitation   . Depression   . Pacemaker     Past  Surgical History  Procedure Date  . Tonsillectomy and adenoidectomy 1937  . Vesicovaginal fistula closure w/ tah 1972  . Goiter removed 1976    cancerous. Then got RAI  . Breast lumpectomy 1983  . Breast biopsy 2002  . Pacemaker insertion 5/07    after syncope  . Cataract extraction, bilateral     Family History  Problem Relation Age of Onset  . Diabetes Mother   . Stroke Sister   . Coronary artery disease Brother   . Prostate cancer Brother   . Breast cancer Neg Hx   . Colon cancer Neg Hx     History   Social History  . Marital Status: Widowed    Spouse Name: N/A    Number of Children: 2  . Years of Education: N/A   Occupational History  . homemaker    Social History Main Topics  . Smoking status: Never Smoker   . Smokeless tobacco: Never Used  . Alcohol Use: No  . Drug Use: No  . Sexually Active: Not on file   Other Topics Concern  . Not on file   Social History Narrative   Widowed 2002. 1 local daughter. Other in [place]. Was  homemaker. Farmed and gardened. 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 Daughter [name]) is health care POA. Discussed DNR and she requests would not want tube feeds   Review of Systems Appetite is okay Weight is stable Bowels are regular---occasionally slow    Objective:   Physical Exam  Constitutional: She appears well-developed and well-nourished. No distress.  Neck: Normal range of motion. Neck supple.  Cardiovascular: Normal rate, regular rhythm and normal heart sounds.  Exam reveals no gallop.   No murmur heard. Pulmonary/Chest: Effort normal and breath sounds normal. No respiratory distress. She has no wheezes. She has no rales.  Abdominal: Soft. Bowel sounds are normal. She exhibits no distension and no mass. There is no tenderness.  Musculoskeletal: She exhibits no edema and no tenderness.  Lymphadenopathy:    She has no cervical adenopathy.          Assessment & Plan:

## 2011-09-16 NOTE — Assessment & Plan Note (Signed)
Clearly seems to be the cause of her symptoms Had fairly severe disease 10 years ago but remitted No dysphagia or odonyphagia Weight stable  Will continue the omeprazole Needs for at least 2 months---and probably should continue indefinitely

## 2011-09-17 ENCOUNTER — Encounter: Payer: Self-pay | Admitting: Internal Medicine

## 2011-09-20 ENCOUNTER — Encounter: Payer: Self-pay | Admitting: *Deleted

## 2011-09-22 ENCOUNTER — Ambulatory Visit (INDEPENDENT_AMBULATORY_CARE_PROVIDER_SITE_OTHER): Payer: Medicare Other | Admitting: Internal Medicine

## 2011-09-22 ENCOUNTER — Encounter: Payer: Self-pay | Admitting: Internal Medicine

## 2011-09-22 VITALS — BP 126/60 | HR 84 | Temp 98.6°F | Ht 62.0 in | Wt 132.0 lb

## 2011-09-22 DIAGNOSIS — R519 Headache, unspecified: Secondary | ICD-10-CM | POA: Insufficient documentation

## 2011-09-22 DIAGNOSIS — R51 Headache: Secondary | ICD-10-CM

## 2011-09-22 MED ORDER — WARFARIN SODIUM 4 MG PO TABS: ORAL_TABLET | ORAL | Status: DC

## 2011-09-22 NOTE — Assessment & Plan Note (Signed)
And arthralgias No fever PE reassuring No tick exposure Discussed alternatives Will just observe Tylenol prn Recheck if worsens

## 2011-09-22 NOTE — Progress Notes (Signed)
Subjective:    Patient ID: Amanda [name], female    DOB: [birth year], 78 y.o.   MRN: [id number]  HPI Here with daughter again  Headache for 2 days Feels sore in joints Feels hot at times but no recorded fever Some sweats at night---takes tylenol at bedtime regularly (not a new thing though)  Has had some nasal drainage--not new Post nasal drip and spits out--but not much cough No SOB  No dysuria or hematuria No urgency  No skin issues Had ear lesion removed from right ear 2 weeks ago---healing well (cyst)  No mosquito or tick bite that she knows about  Current Outpatient Prescriptions on File Prior to Visit  Medication Sig Dispense Refill  . acetaminophen (TYLENOL) 500 MG tablet Take 500 mg by mouth as needed.        . hydrochlorothiazide (HYDRODIURIL) 25 MG tablet Take 1 tablet (25 mg total) by mouth daily.  90 tablet  3  . levothyroxine (SYNTHROID, LEVOTHROID) 50 MCG tablet Take 1 tablet (50 mcg total) by mouth daily.  90 tablet  3  . lisinopril (PRINIVIL,ZESTRIL) 40 MG tablet Take 1 tablet (40 mg total) by mouth daily.  90 tablet  3  . mirtazapine (REMERON) 15 MG tablet Take one tablet by mouth at bedtime.  90 tablet  3  . nitroGLYCERIN (NITROSTAT) 0.4 MG SL tablet Place 0.4 mg under the tongue every 5 (five) minutes as needed.        Marland Kitchen omeprazole (PRILOSEC) 20 MG capsule Take 20 mg by mouth daily.      . sotalol (BETAPACE) 120 MG tablet TAKE 1 TABLET (120 MG TOTAL) BY MOUTH 2 (TWO) TIMES DAILY.  30 tablet  0  . DISCONTD: warfarin (COUMADIN) 4 MG tablet Take one to two daily as directed.  60 tablet  1    Allergies  Allergen Reactions  . Sulfonamide Derivatives     Past Medical History  Diagnosis Date  . Atrial fibrillation   . Hyperlipidemia   . Hypertension   . Hypothyroidism     after Rx for thyroid cancer  . Osteoarthritis   . Sleep disturbance   . Urinary incontinence     usually from UTI's  . Mitral regurgitation   . Depression   . Pacemaker   . GERD  (gastroesophageal reflux disease)     Past Surgical History  Procedure Date  . Tonsillectomy and adenoidectomy 1937  . Vesicovaginal fistula closure w/ tah 1972  . Goiter removed 1976    cancerous. Then got RAI  . Breast lumpectomy 1983  . Breast biopsy 2002  . Pacemaker insertion 5/07    after syncope  . Cataract extraction, bilateral     Family History  Problem Relation Age of Onset  . Diabetes Mother   . Stroke Sister   . Coronary artery disease Brother   . Prostate cancer Brother   . Breast cancer Neg Hx   . Colon cancer Neg Hx     History   Social History  . Marital Status: Widowed    Spouse Name: N/A    Number of Children: 2  . Years of Education: N/A   Occupational History  . homemaker    Social History Main Topics  . Smoking status: Never Smoker   . Smokeless tobacco: Never Used  . Alcohol Use: No  . Drug Use: No  . Sexually Active: Not on file   Other Topics Concern  . Not on file   Social History Narrative  Widowed 2002. 1 local daughter. Other in [place]. Was homemaker. Farmed and gardened. 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 Daughter [name]) is health care POA. Discussed DNR and she requests would not want tube feeds   Review of Systems Appetite is okay Weight stable    Objective:   Physical Exam  Constitutional: She appears well-developed and well-nourished. No distress.  HENT:  Mouth/Throat: Oropharynx is clear and moist. No oropharyngeal exudate.       Mild pale nasal congestion  Neck: Normal range of motion. Neck supple.  Cardiovascular: Normal rate, regular rhythm and normal heart sounds.  Exam reveals no gallop.   No murmur heard. Pulmonary/Chest: Effort normal and breath sounds normal. No respiratory distress. She has no wheezes. She has no rales.  Abdominal: Soft. There is no tenderness.  Musculoskeletal: She exhibits no edema and no tenderness.  Lymphadenopathy:    She has no cervical adenopathy.    She has no axillary adenopathy.        Right: No inguinal adenopathy present.       Left: No inguinal adenopathy present.  Skin: No rash noted. No erythema.          Assessment & Plan:

## 2011-09-28 ENCOUNTER — Ambulatory Visit (INDEPENDENT_AMBULATORY_CARE_PROVIDER_SITE_OTHER): Payer: Medicare Other | Admitting: Internal Medicine

## 2011-09-28 DIAGNOSIS — I4891 Unspecified atrial fibrillation: Secondary | ICD-10-CM

## 2011-09-28 DIAGNOSIS — Z5181 Encounter for therapeutic drug level monitoring: Secondary | ICD-10-CM | POA: Diagnosis not present

## 2011-09-28 DIAGNOSIS — Z7901 Long term (current) use of anticoagulants: Secondary | ICD-10-CM

## 2011-09-28 NOTE — Patient Instructions (Signed)
Continue current dose, check in 4 weeks  

## 2011-10-27 ENCOUNTER — Ambulatory Visit (INDEPENDENT_AMBULATORY_CARE_PROVIDER_SITE_OTHER): Payer: Medicare Other | Admitting: Internal Medicine

## 2011-10-27 DIAGNOSIS — I4891 Unspecified atrial fibrillation: Secondary | ICD-10-CM

## 2011-10-27 DIAGNOSIS — Z7901 Long term (current) use of anticoagulants: Secondary | ICD-10-CM

## 2011-10-27 DIAGNOSIS — Z5181 Encounter for therapeutic drug level monitoring: Secondary | ICD-10-CM

## 2011-10-27 NOTE — Patient Instructions (Signed)
Continue  4 mg daily, 2 mg T,TH,SAT, recheck 4 weeks

## 2011-11-03 DIAGNOSIS — H6120 Impacted cerumen, unspecified ear: Secondary | ICD-10-CM | POA: Diagnosis not present

## 2011-11-03 DIAGNOSIS — H903 Sensorineural hearing loss, bilateral: Secondary | ICD-10-CM | POA: Diagnosis not present

## 2011-11-11 DIAGNOSIS — B351 Tinea unguium: Secondary | ICD-10-CM | POA: Diagnosis not present

## 2011-11-11 DIAGNOSIS — M79609 Pain in unspecified limb: Secondary | ICD-10-CM | POA: Diagnosis not present

## 2011-11-24 ENCOUNTER — Other Ambulatory Visit: Payer: Self-pay

## 2011-11-24 ENCOUNTER — Ambulatory Visit (INDEPENDENT_AMBULATORY_CARE_PROVIDER_SITE_OTHER): Payer: Medicare Other | Admitting: Family Medicine

## 2011-11-24 ENCOUNTER — Ambulatory Visit (INDEPENDENT_AMBULATORY_CARE_PROVIDER_SITE_OTHER): Payer: Medicare Other | Admitting: Cardiovascular Disease

## 2011-11-24 ENCOUNTER — Encounter: Payer: Self-pay | Admitting: Cardiovascular Disease

## 2011-11-24 VITALS — BP 118/62 | HR 62 | Ht 62.0 in | Wt 132.0 lb

## 2011-11-24 DIAGNOSIS — Z7901 Long term (current) use of anticoagulants: Secondary | ICD-10-CM | POA: Diagnosis not present

## 2011-11-24 DIAGNOSIS — I495 Sick sinus syndrome: Secondary | ICD-10-CM

## 2011-11-24 DIAGNOSIS — I059 Rheumatic mitral valve disease, unspecified: Secondary | ICD-10-CM

## 2011-11-24 DIAGNOSIS — I4891 Unspecified atrial fibrillation: Secondary | ICD-10-CM

## 2011-11-24 DIAGNOSIS — Z5181 Encounter for therapeutic drug level monitoring: Secondary | ICD-10-CM | POA: Diagnosis not present

## 2011-11-24 DIAGNOSIS — I10 Essential (primary) hypertension: Secondary | ICD-10-CM

## 2011-11-24 LAB — POCT INR: INR: 2.5

## 2011-11-24 MED ORDER — OMEPRAZOLE 20 MG PO CPDR: 20.0000 mg | DELAYED_RELEASE_CAPSULE | Freq: Every day | ORAL | Status: DC

## 2011-11-24 NOTE — Progress Notes (Signed)
Patient ID: Amanda [name], female    DOB: [birth year], 78 y.o.   MRN: [id number]  HPI Comments: Amanda [name] is a pleasant 78 year old woman who  has a history of paroxysmal atrial fibrillation, normal systolic function by echocardiogram in 2007 with  MR, mild aortic valve regurgitation, mildly dilated left atrium, Holter monitor and April 2007 showing frequent and complex supraventricular arrhythmia, cardiac CTA showing no significant coronary artery disease with atherosclerotic plaque in the thoracic aorta who has been managed on sotalol for rhythm control, pacer placed in May 2007 with a history of "fainting "prior to that who presents for routine followup.  Overall, she states that she has been doing well. She is unaware if she has had any recent episodes of atrial fibrillation. She is relatively sedentary, does walk  during the daytime. She lives at [facility] [place]. She has had several falls this year and last year. She does better with a walker but does not use a cane or walker. She has required stitches to her knees from a previous fall.  She denies any shortness of breath, chest pain. . She has no complaints.   Cardiac catheterization in April 2007 showing no significant coronary artery disease.  EKG shows normal sinus rhythm with rate 63 beats per minute with nonspecific ST abnormality   Outpatient Encounter Prescriptions as of 11/24/2011  Medication Sig Dispense Refill  . acetaminophen (TYLENOL) 500 MG tablet Take 500 mg by mouth as needed.        . hydrochlorothiazide (HYDRODIURIL) 25 MG tablet Take 1 tablet (25 mg total) by mouth daily.  90 tablet  3  . levothyroxine (SYNTHROID, LEVOTHROID) 50 MCG tablet Take 1 tablet (50 mcg total) by mouth daily.  90 tablet  3  . lisinopril (PRINIVIL,ZESTRIL) 40 MG tablet Take 1 tablet (40 mg total) by mouth daily.  90 tablet  3  . mirtazapine (REMERON) 15 MG tablet Take one tablet by mouth at bedtime.  90 tablet  3  . nitroGLYCERIN (NITROSTAT) 0.4 MG SL tablet  Place 0.4 mg under the tongue every 5 (five) minutes as needed.        Marland Kitchen omeprazole (PRILOSEC) 20 MG capsule Take 20 mg by mouth daily.      . sotalol (BETAPACE) 120 MG tablet TAKE 1 TABLET (120 MG TOTAL) BY MOUTH 2 (TWO) TIMES DAILY.  30 tablet  0  . warfarin (COUMADIN) 4 MG tablet Take one to two daily as directed.  90 tablet  1     Review of Systems  Constitutional: Negative.   HENT: Negative.   Eyes: Negative.   Respiratory: Negative.   Cardiovascular: Negative.   Gastrointestinal: Negative.   Musculoskeletal: Positive for gait problem.  Skin: Negative.   Neurological: Negative.   Hematological: Negative.   Psychiatric/Behavioral: Negative.   All other systems reviewed and are negative.    BP 118/62  Pulse 62  Ht 5\' 2"  (1.575 m)  Wt 132 lb (59.875 kg)  BMI 24.14 kg/m2\  Physical Exam  Nursing note and vitals reviewed. Constitutional: She is oriented to person, place, and time. She appears well-developed and well-nourished.  HENT:  Head: Normocephalic.  Nose: Nose normal.  Mouth/Throat: Oropharynx is clear and moist.  Eyes: Conjunctivae are normal. Pupils are equal, round, and reactive to light.  Neck: Normal range of motion. Neck supple. No JVD present.  Cardiovascular: Normal rate, regular rhythm, S1 normal, S2 normal, normal heart sounds and intact distal pulses.  Exam reveals no gallop and no friction rub.  No murmur heard. Pulmonary/Chest: Effort normal and breath sounds normal. No respiratory distress. She has no wheezes. She has no rales. She exhibits no tenderness.  Abdominal: Soft. Bowel sounds are normal. She exhibits no distension. There is no tenderness.  Musculoskeletal: Normal range of motion. She exhibits no edema and no tenderness.  Lymphadenopathy:    She has no cervical adenopathy.  Neurological: She is alert and oriented to person, place, and time. Coordination normal.  Skin: Skin is warm and dry. No rash noted. No erythema.  Psychiatric: She has  a normal mood and affect. Her behavior is normal. Judgment and thought content normal.         Assessment and Plan

## 2011-11-24 NOTE — Assessment & Plan Note (Signed)
Appears to be holding normal sinus rhythm. She has pacemaker check scheduled in the near future.

## 2011-11-24 NOTE — Assessment & Plan Note (Signed)
Blood pressure is well controlled on today's visit. No changes made to the medications. 

## 2011-11-24 NOTE — Assessment & Plan Note (Signed)
Clinical exam does not suggest worsening valvular disease, no signs of heart failure.

## 2011-11-24 NOTE — Telephone Encounter (Signed)
Amanda [name] request 90 day refill omeprazole to [facility]. Notified Amanda [name] by phone [facility];Amanda [name] will discuss with Dr Silvio Pate at 12/2011 appt if should continue Omrprazole.

## 2011-11-24 NOTE — Patient Instructions (Signed)
Continue current dose, check in 4 weeks  

## 2011-11-24 NOTE — Patient Instructions (Addendum)
You are doing well. No medication changes were made.  Please call us if you have new issues that need to be addressed before your next appt.  Your physician wants you to follow-up in: 12 months.  You will receive a reminder letter in the mail two months in advance. If you don't receive a letter, please call our office to schedule the follow-up appointment. 

## 2011-11-24 NOTE — Assessment & Plan Note (Signed)
No further episodes of syncope or fainting since pacemaker placed. She has had falls.

## 2011-12-08 ENCOUNTER — Encounter: Payer: Self-pay | Admitting: *Deleted

## 2011-12-09 ENCOUNTER — Ambulatory Visit (INDEPENDENT_AMBULATORY_CARE_PROVIDER_SITE_OTHER): Payer: Medicare Other | Admitting: Family Medicine

## 2011-12-09 ENCOUNTER — Encounter: Payer: Self-pay | Admitting: Family Medicine

## 2011-12-09 VITALS — BP 126/82 | HR 76 | Temp 98.1°F | Wt 133.5 lb

## 2011-12-09 DIAGNOSIS — J069 Acute upper respiratory infection, unspecified: Secondary | ICD-10-CM | POA: Diagnosis not present

## 2011-12-09 MED ORDER — AMOXICILLIN-POT CLAVULANATE 875-125 MG PO TABS: 1.0000 | ORAL_TABLET | Freq: Two times a day (BID) | ORAL | Status: AC

## 2011-12-09 NOTE — Patient Instructions (Addendum)
You do have early sinus infection. Take medicine as prescribed: augmentin twice daily for 10 days Push fluids and plenty of rest. Nasal saline irrigation or neti pot to help drain sinuses. May use immediate release guaifenesin with plenty of fluid to help mobilize mucous (make sure only guaifenesin). Let us know if fever >101.5, trouble opening/closing mouth, difficulty swallowing, or worsening - you may need to be seen again. Return next week to check coumadin as we are starting antibiotic (Thursday morning).

## 2011-12-09 NOTE — Progress Notes (Signed)
  Subjective:    Patient ID: Amanda [name], female    DOB: 03-30-1920, 78 y.o.   MRN: [id number]  HPI CC: "sinus infection"  Pleasant 78yo pt of Dr. Alla German new to me with h/o afib with pacer on coumadin and sotalol presents with 1 d h/o ear, throat, pain and nasal congestion.  More hoarse today.  ++PNdrainage.  Choking on flegm.  L ear hurts more than right.  HA resolved with tylenol.  Hasn't tried anything other than tylenol.  Denies fevers/chills, coughing, abd pain, n/v,CP/tightness, SOB.  Last week had diarrhea and some nausea.  No tooth pain.  No sick contacts at home, no smokers at home.  No h/o asthma/COPD  Past Medical History  Diagnosis Date  . Atrial fibrillation   . Hyperlipidemia   . Hypertension   . Hypothyroidism     after Rx for thyroid cancer  . Osteoarthritis   . Sleep disturbance   . Urinary incontinence     usually from UTI's  . Mitral regurgitation   . Depression   . Pacemaker   . GERD (gastroesophageal reflux disease)      Review of Systems Per HPI    Objective:   Physical Exam  Nursing note and vitals reviewed. Constitutional: She appears well-developed and well-nourished. No distress.  HENT:  Head: Normocephalic and atraumatic.  Right Ear: External ear and ear canal normal. Decreased hearing is noted.  Left Ear: Tympanic membrane, external ear and ear canal normal. Decreased hearing is noted.  Nose: No mucosal edema or rhinorrhea. Right sinus exhibits no maxillary sinus tenderness and no frontal sinus tenderness. Left sinus exhibits no maxillary sinus tenderness and no frontal sinus tenderness.  Mouth/Throat: Uvula is midline and mucous membranes are normal. Posterior oropharyngeal erythema (mild) present. No oropharyngeal exudate, posterior oropharyngeal edema or tonsillar abscesses.       R TM deformity present Pale turbinates  Eyes: Conjunctivae and EOM are normal. Pupils are equal, round, and reactive to light. No scleral icterus.  Neck:  Normal range of motion. Neck supple.  Cardiovascular: Normal rate, regular rhythm, normal heart sounds and intact distal pulses.   No murmur heard. Pulmonary/Chest: Effort normal. No respiratory distress. She has no wheezes. She has no rales.       RLL basilar crackles. Upper airway transmission  Lymphadenopathy:    She has no cervical adenopathy.  Skin: Skin is warm and dry. No rash noted.       Assessment & Plan:

## 2011-12-09 NOTE — Assessment & Plan Note (Signed)
Anticipate early urti, per daughter has h/o becoming sinusitis. Possibly developing into sinusitis. Treat aggressively with augmentin twice daily for 10 days. update [place] if sxs not improving as expected. See pt instructions for plan. Asked them to return in 1 wk for INR as starting abx.

## 2011-12-16 ENCOUNTER — Ambulatory Visit (INDEPENDENT_AMBULATORY_CARE_PROVIDER_SITE_OTHER): Payer: Medicare Other | Admitting: Internal Medicine

## 2011-12-16 ENCOUNTER — Telehealth: Payer: Self-pay

## 2011-12-16 ENCOUNTER — Encounter: Payer: Self-pay | Admitting: Internal Medicine

## 2011-12-16 VITALS — BP 110/60 | HR 64 | Ht 63.0 in | Wt 134.5 lb

## 2011-12-16 DIAGNOSIS — Z7901 Long term (current) use of anticoagulants: Secondary | ICD-10-CM | POA: Diagnosis not present

## 2011-12-16 DIAGNOSIS — I4891 Unspecified atrial fibrillation: Secondary | ICD-10-CM | POA: Diagnosis not present

## 2011-12-16 DIAGNOSIS — N184 Chronic kidney disease, stage 4 (severe): Secondary | ICD-10-CM | POA: Insufficient documentation

## 2011-12-16 DIAGNOSIS — I10 Essential (primary) hypertension: Secondary | ICD-10-CM

## 2011-12-16 DIAGNOSIS — Z5181 Encounter for therapeutic drug level monitoring: Secondary | ICD-10-CM

## 2011-12-16 DIAGNOSIS — I495 Sick sinus syndrome: Secondary | ICD-10-CM

## 2011-12-16 DIAGNOSIS — Z950 Presence of cardiac pacemaker: Secondary | ICD-10-CM

## 2011-12-16 LAB — POCT INR: INR: 2.3

## 2011-12-16 LAB — PACEMAKER DEVICE OBSERVATION
AL IMPEDENCE PM: 724 Ohm
AL THRESHOLD: 1 V
ATRIAL PACING PM: 96
RV LEAD AMPLITUDE: 4 mv
RV LEAD IMPEDENCE PM: 463 Ohm

## 2011-12-16 NOTE — Progress Notes (Signed)
Patient Care Team: Venia Carbon, MD as PCP - General   HPI  Amanda [name] is a 78 y.o. female seen in followup with a previiously implanted pacemaker in 2007 for syncope and apparently tachybradycardia syndrome she has a history of paroxysmal atrial fibrillation managed with sotalol   Last creatinine was 45 with an estimated creatinine clearance of 28;  potassium at that time was 4.0 and magnesium was 1.8   The patient denies SOB, chest pain edema or palpitations. There has been no syncope or presyncope.  She has had intercurrent problem with sinusitis complicated by diarrhea triggered by amoxicillin   Past Medical History  Diagnosis Date  . Atrial fibrillation   . Hyperlipidemia   . Hypertension   . Hypothyroidism     after Rx for thyroid cancer  . Osteoarthritis   . Sleep disturbance   . Urinary incontinence     usually from UTI's  . Mitral regurgitation   . Depression   . Pacemaker   . GERD (gastroesophageal reflux disease)     Past Surgical History  Procedure Date  . Tonsillectomy and adenoidectomy 1937  . Vesicovaginal fistula closure w/ tah 1972  . Goiter removed 1976    cancerous. Then got RAI  . Breast lumpectomy 1983  . Breast biopsy 2002  . Pacemaker insertion 5/07    after syncope  . Cataract extraction, bilateral     Current Outpatient Prescriptions  Medication Sig Dispense Refill  . acetaminophen (TYLENOL) 500 MG tablet Take 500 mg by mouth as needed.        . hydrochlorothiazide (HYDRODIURIL) 25 MG tablet Take 1 tablet (25 mg total) by mouth daily.  90 tablet  3  . levothyroxine (SYNTHROID, LEVOTHROID) 50 MCG tablet Take 1 tablet (50 mcg total) by mouth daily.  90 tablet  3  . lisinopril (PRINIVIL,ZESTRIL) 40 MG tablet Take 1 tablet (40 mg total) by mouth daily.  90 tablet  3  . mirtazapine (REMERON) 15 MG tablet Take one tablet by mouth at bedtime.  90 tablet  3  . nitroGLYCERIN (NITROSTAT) 0.4 MG SL tablet Place 0.4 mg under the tongue every 5  (five) minutes as needed.        Marland Kitchen omeprazole (PRILOSEC) 20 MG capsule Take 1 capsule (20 mg total) by mouth daily.  90 capsule  0  . sotalol (BETAPACE) 120 MG tablet TAKE 1 TABLET (120 MG TOTAL) BY MOUTH 2 (TWO) TIMES DAILY.  30 tablet  0  . warfarin (COUMADIN) 4 MG tablet Take one to two daily as directed.  90 tablet  1  . amoxicillin-clavulanate (AUGMENTIN) 875-125 MG per tablet Take 1 tablet by mouth 2 (two) times daily.  20 tablet  0    Allergies  Allergen Reactions  . Amoxicillin Diarrhea    Weakness and atrial fib  . Augmentin (Amoxicillin-Pot Clavulanate) Diarrhea    Weakness and atrial fib  . Sulfonamide Derivatives     Review of Systems negative except from HPI and PMH  Physical Exam BP 110/60  Pulse 64  Ht 5\' 3"  (1.6 m)  Wt 134 lb 8 oz (61.009 kg)  BMI 23.83 kg/m2 Well developed and nourished in no acute distress HENT normal Neck supple with JVP-flat Clear Regular rate and rhythm, no murmurs +s$\ Abd-soft with active BS No Clubbing cyanosis edema Skin-warm and dry A & Oriented  Grossly normal sensory and motor function    Assessment and  Plan

## 2011-12-16 NOTE — Telephone Encounter (Signed)
Please put down aumentin as an intolerance --though severe enough to not give her any amoxil or augmentin again  Sorry to hear she had such trouble with it

## 2011-12-16 NOTE — Telephone Encounter (Signed)
Pt and pt's daughter here for lab test; pts daughter, [name] wanted Dr Silvio Pate to know that pt was given Augmentin for sinusitis. Pt took total of 9 pills in 4 or 5 days and stopped due to diarrhea,atrial fib attack and weakness; and pt took ntg for heart pounding (no chest pain). After taking ntg pt fell (checked by nurses at [facility]) no apparent injury. Sinus infection is gone and pt to see Dr Cleda Mccreedy cardiologist today re; pt still feeling weak. Wanted amoxicillin and augmentin listed as allergies on pts chart.

## 2011-12-16 NOTE — Assessment & Plan Note (Signed)
1 creatinine clearance with her last blood work is about 30. We will reduce her sotalol dose every 24. We will decrease the total daily dose from 240-180 and if her atrial fibrillation burden not change extensively, I would like to decrease it back to 120 daily at our next visit. We have discussed the pro arrhythmic risk and the need for dose reduction.

## 2011-12-16 NOTE — Telephone Encounter (Signed)
Spoke with daughter and advised results.  

## 2011-12-16 NOTE — Assessment & Plan Note (Signed)
Stable as above

## 2011-12-16 NOTE — Patient Instructions (Signed)
Continue  4 mg daily, 2 mg T,TH,SAT, recheck 4 weeks

## 2011-12-16 NOTE — Assessment & Plan Note (Signed)
Reasonably controlled

## 2011-12-16 NOTE — Assessment & Plan Note (Signed)
The patient's device was interrogated.  The information was reviewed. No changes were made in the programming.    

## 2011-12-16 NOTE — Patient Instructions (Addendum)
Your physician wants you to follow-up in:6 months with Dr. Caryl Comes and a  carelink transmission 03/30/12 You will receive a reminder letter in the mail two months in advance. If you don't receive a letter, please call our office to schedule the follow-up appointment.  Your physician has recommended you make the following change in your medication:  -decrease sotalol to 180 mg daily (1 1/2 tablets daily)

## 2011-12-16 NOTE — Assessment & Plan Note (Addendum)
As above; I've explained that they may find more atrial fibrillation occurs as we decrease the daily dose. She has had about 10 episodes in the last 6 months, or which have lasted longer than 4 hours.  she ins on wafarin

## 2011-12-28 ENCOUNTER — Encounter: Payer: Self-pay | Admitting: Internal Medicine

## 2011-12-28 ENCOUNTER — Ambulatory Visit (INDEPENDENT_AMBULATORY_CARE_PROVIDER_SITE_OTHER): Payer: Medicare Other | Admitting: Internal Medicine

## 2011-12-28 VITALS — BP 138/80 | HR 82 | Temp 98.4°F | Ht 63.0 in | Wt 132.0 lb

## 2011-12-28 DIAGNOSIS — I10 Essential (primary) hypertension: Secondary | ICD-10-CM

## 2011-12-28 DIAGNOSIS — Z5181 Encounter for therapeutic drug level monitoring: Secondary | ICD-10-CM | POA: Diagnosis not present

## 2011-12-28 DIAGNOSIS — Z7901 Long term (current) use of anticoagulants: Secondary | ICD-10-CM | POA: Diagnosis not present

## 2011-12-28 DIAGNOSIS — I4891 Unspecified atrial fibrillation: Secondary | ICD-10-CM | POA: Diagnosis not present

## 2011-12-28 DIAGNOSIS — G479 Sleep disorder, unspecified: Secondary | ICD-10-CM

## 2011-12-28 LAB — BASIC METABOLIC PANEL
CO2: 29 mEq/L (ref 19–32)
Calcium: 9.8 mg/dL (ref 8.4–10.5)
Glucose, Bld: 97 mg/dL (ref 70–99)
Sodium: 138 mEq/L (ref 135–145)

## 2011-12-28 NOTE — Assessment & Plan Note (Signed)
Does okay with the mirtazapine This also seems to help her mood

## 2011-12-28 NOTE — Assessment & Plan Note (Signed)
Regular on exam Now on lower sotalol dose No changes since that dose decrease

## 2011-12-28 NOTE — Patient Instructions (Signed)
Continue  4 mg daily, 2 mg T,TH,SAT, recheck 4 weeks

## 2011-12-28 NOTE — Assessment & Plan Note (Signed)
BP Readings from Last 3 Encounters:  12/28/11 138/80  12/16/11 110/60  12/09/11 126/82   Good control  No changes needed Will recheck renal function

## 2011-12-28 NOTE — Progress Notes (Signed)
Subjective:    Patient ID: Amanda [name], female    DOB: 1920-03-26, 78 y.o.   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 acetaminophen (TYLENOL) 500 MG tablet Take 500 mg by mouth as needed.        . hydrochlorothiazide (HYDRODIURIL) 25 MG tablet Take 1 tablet (25 mg total) by mouth daily.  90 tablet  3  . levothyroxine (SYNTHROID, LEVOTHROID) 50 MCG tablet Take 1 tablet (50 mcg total) by mouth daily.  90 tablet  3  . lisinopril (PRINIVIL,ZESTRIL) 40 MG tablet Take 1 tablet (40 mg total) by mouth daily.  90 tablet  3  . mirtazapine (REMERON) 15 MG tablet Take one tablet by mouth at bedtime.  90 tablet  3  . nitroGLYCERIN (NITROSTAT) 0.4 MG SL tablet Place 0.4 mg under the tongue every 5 (five) minutes as needed.        Marland Kitchen omeprazole (PRILOSEC) 20 MG capsule Take 1 capsule (20 mg total) by mouth daily.  90 capsule  0  . warfarin (COUMADIN) 4 MG tablet Take one to two daily as directed.  90 tablet  1    Allergies  Allergen Reactions  . Amoxicillin Diarrhea    Weakness and atrial fib  . Augmentin (Amoxicillin-Pot Clavulanate) Diarrhea    Weakness and atrial fib  . Sulfonamide Derivatives     Past Medical History  Diagnosis Date  . Atrial fibrillation   . Hyperlipidemia   . Hypertension   . Hypothyroidism     after Rx for thyroid cancer  . Osteoarthritis   . Sleep disturbance   . Urinary incontinence    usually from UTI's  . Mitral regurgitation   . Depression   . Pacemaker   . GERD (gastroesophageal reflux disease)     Past Surgical History  Procedure Date  . Tonsillectomy and adenoidectomy 1937  . Vesicovaginal fistula closure w/ tah 1972  . Goiter removed 1976    cancerous. Then got RAI  . Breast lumpectomy 1983  . Breast biopsy 2002  . Pacemaker insertion 5/07    after syncope  . Cataract extraction, bilateral     Family History  Problem Relation Age of Onset  . Diabetes Mother   . Stroke Sister   . Coronary artery disease Brother   . Prostate cancer Brother   . Breast cancer Neg Hx   . Colon cancer Neg Hx     History   Social History  . Marital Status: Widowed    Spouse Name: N/A    Number of Children: 2  . Years of Education: N/A   Occupational History  . homemaker    Social History Main Topics  . Smoking status: Never Smoker   . Smokeless tobacco: Never Used  . Alcohol Use: No  . Drug Use: No  . Sexually Active: Not on file   Other Topics Concern  . Not on file   Social History Narrative   Widowed 2002.  1 local daughter. Other in [place]. Was homemaker. Farmed and gardened. 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 Daughter [name]) is health care POA. Discussed DNR and she requests would not want tube feeds   Review of Systems Appetite is good Weight is stable Bowels are coming back to normal---diarrhea with the augmentin    Objective:   Physical Exam  Constitutional: She appears well-developed and well-nourished. No distress.  Neck: Normal range of motion. Neck supple. No thyromegaly present.  Cardiovascular: Normal rate, regular rhythm and normal heart sounds.  Exam reveals no gallop.   No murmur heard. Pulmonary/Chest: Effort normal and breath sounds normal. No respiratory distress. She has no wheezes. She has no rales.  Musculoskeletal: She exhibits no edema and no tenderness.  Lymphadenopathy:    She has no cervical adenopathy.  Psychiatric: She has a  normal mood and affect. Her behavior is normal. Thought content normal.          Assessment & Plan:

## 2011-12-29 ENCOUNTER — Ambulatory Visit: Payer: Medicare Other | Admitting: Internal Medicine

## 2011-12-30 ENCOUNTER — Encounter: Payer: Self-pay | Admitting: *Deleted

## 2012-01-13 ENCOUNTER — Ambulatory Visit: Payer: Medicare Other

## 2012-01-19 DIAGNOSIS — L738 Other specified follicular disorders: Secondary | ICD-10-CM | POA: Diagnosis not present

## 2012-01-19 DIAGNOSIS — D485 Neoplasm of uncertain behavior of skin: Secondary | ICD-10-CM | POA: Diagnosis not present

## 2012-01-19 DIAGNOSIS — L570 Actinic keratosis: Secondary | ICD-10-CM | POA: Diagnosis not present

## 2012-01-25 ENCOUNTER — Ambulatory Visit (INDEPENDENT_AMBULATORY_CARE_PROVIDER_SITE_OTHER): Payer: Medicare Other | Admitting: Internal Medicine

## 2012-01-25 DIAGNOSIS — Z7901 Long term (current) use of anticoagulants: Secondary | ICD-10-CM

## 2012-01-25 DIAGNOSIS — I4891 Unspecified atrial fibrillation: Secondary | ICD-10-CM

## 2012-01-25 DIAGNOSIS — Z5181 Encounter for therapeutic drug level monitoring: Secondary | ICD-10-CM

## 2012-01-25 LAB — POCT INR: INR: 2.4

## 2012-01-25 NOTE — Patient Instructions (Signed)
Continue current dose, check in 4 weeks  

## 2012-02-09 DIAGNOSIS — Z23 Encounter for immunization: Secondary | ICD-10-CM | POA: Diagnosis not present

## 2012-02-17 DIAGNOSIS — M79609 Pain in unspecified limb: Secondary | ICD-10-CM | POA: Diagnosis not present

## 2012-02-17 DIAGNOSIS — B351 Tinea unguium: Secondary | ICD-10-CM | POA: Diagnosis not present

## 2012-02-23 ENCOUNTER — Ambulatory Visit: Payer: Medicare Other

## 2012-02-24 ENCOUNTER — Ambulatory Visit (INDEPENDENT_AMBULATORY_CARE_PROVIDER_SITE_OTHER): Payer: Medicare Other | Admitting: Internal Medicine

## 2012-02-24 DIAGNOSIS — Z7901 Long term (current) use of anticoagulants: Secondary | ICD-10-CM

## 2012-02-24 DIAGNOSIS — L819 Disorder of pigmentation, unspecified: Secondary | ICD-10-CM | POA: Diagnosis not present

## 2012-02-24 DIAGNOSIS — L820 Inflamed seborrheic keratosis: Secondary | ICD-10-CM | POA: Diagnosis not present

## 2012-02-24 DIAGNOSIS — I4891 Unspecified atrial fibrillation: Secondary | ICD-10-CM | POA: Diagnosis not present

## 2012-02-24 DIAGNOSIS — Z5181 Encounter for therapeutic drug level monitoring: Secondary | ICD-10-CM | POA: Diagnosis not present

## 2012-02-24 DIAGNOSIS — L570 Actinic keratosis: Secondary | ICD-10-CM | POA: Diagnosis not present

## 2012-02-24 LAB — POCT INR: INR: 2.7

## 2012-02-24 NOTE — Patient Instructions (Addendum)
Continue  4 mg daily, 2 mg T,TH,SAT, recheck 4 weeks

## 2012-02-29 ENCOUNTER — Other Ambulatory Visit: Payer: Self-pay | Admitting: Internal Medicine

## 2012-03-20 ENCOUNTER — Encounter: Payer: Self-pay | Admitting: Internal Medicine

## 2012-03-20 ENCOUNTER — Ambulatory Visit (INDEPENDENT_AMBULATORY_CARE_PROVIDER_SITE_OTHER): Payer: Medicare Other | Admitting: *Deleted

## 2012-03-20 DIAGNOSIS — I4891 Unspecified atrial fibrillation: Secondary | ICD-10-CM

## 2012-03-20 DIAGNOSIS — Z950 Presence of cardiac pacemaker: Secondary | ICD-10-CM

## 2012-03-22 ENCOUNTER — Ambulatory Visit: Payer: Medicare Other

## 2012-03-23 ENCOUNTER — Ambulatory Visit (INDEPENDENT_AMBULATORY_CARE_PROVIDER_SITE_OTHER): Payer: Medicare Other | Admitting: General Practice

## 2012-03-23 DIAGNOSIS — Z7901 Long term (current) use of anticoagulants: Secondary | ICD-10-CM

## 2012-03-23 DIAGNOSIS — I4891 Unspecified atrial fibrillation: Secondary | ICD-10-CM | POA: Diagnosis not present

## 2012-03-23 DIAGNOSIS — Z5181 Encounter for therapeutic drug level monitoring: Secondary | ICD-10-CM | POA: Diagnosis not present

## 2012-03-27 LAB — REMOTE PACEMAKER DEVICE
AL IMPEDENCE PM: 709 Ohm
BATTERY VOLTAGE: 2.74 V
RV LEAD AMPLITUDE: 8 mv
RV LEAD IMPEDENCE PM: 451 Ohm
VENTRICULAR PACING PM: 0

## 2012-04-04 ENCOUNTER — Encounter: Payer: Self-pay | Admitting: *Deleted

## 2012-04-20 ENCOUNTER — Ambulatory Visit (INDEPENDENT_AMBULATORY_CARE_PROVIDER_SITE_OTHER): Payer: Medicare Other | Admitting: General Practice

## 2012-04-20 DIAGNOSIS — I4891 Unspecified atrial fibrillation: Secondary | ICD-10-CM

## 2012-04-20 DIAGNOSIS — Z7901 Long term (current) use of anticoagulants: Secondary | ICD-10-CM

## 2012-04-20 DIAGNOSIS — Z5181 Encounter for therapeutic drug level monitoring: Secondary | ICD-10-CM

## 2012-04-20 LAB — POCT INR: INR: 5.5

## 2012-04-20 NOTE — Patient Instructions (Signed)
Skip coumadin today, tomorrow and Saturday and then change dosage to 1/2 tablet all days except 1 tablet on Monday and Thursday. Re-check in 10 days.

## 2012-04-28 ENCOUNTER — Other Ambulatory Visit: Payer: Self-pay | Admitting: Internal Medicine

## 2012-04-28 NOTE — Telephone Encounter (Signed)
rx sent to pharmacy by e-script  

## 2012-05-01 ENCOUNTER — Ambulatory Visit (INDEPENDENT_AMBULATORY_CARE_PROVIDER_SITE_OTHER): Payer: Medicare Other | Admitting: General Practice

## 2012-05-01 ENCOUNTER — Other Ambulatory Visit: Payer: Self-pay | Admitting: *Deleted

## 2012-05-01 DIAGNOSIS — I4891 Unspecified atrial fibrillation: Secondary | ICD-10-CM

## 2012-05-01 DIAGNOSIS — Z5181 Encounter for therapeutic drug level monitoring: Secondary | ICD-10-CM | POA: Diagnosis not present

## 2012-05-01 DIAGNOSIS — Z7901 Long term (current) use of anticoagulants: Secondary | ICD-10-CM

## 2012-05-01 LAB — POCT INR: INR: 2.6

## 2012-05-01 MED ORDER — WARFARIN SODIUM 4 MG PO TABS: ORAL_TABLET | ORAL | Status: DC

## 2012-05-01 MED ORDER — OMEPRAZOLE 20 MG PO CPDR: DELAYED_RELEASE_CAPSULE | ORAL | Status: DC

## 2012-05-01 NOTE — Patient Instructions (Signed)
Take 1/2 tablet all days except 1 tablet on Monday and Thursday. Re-check in 3 weeks.

## 2012-05-02 ENCOUNTER — Other Ambulatory Visit: Payer: Self-pay

## 2012-05-02 NOTE — Telephone Encounter (Signed)
Amanda [name] pts daughter left v/m [facility] rx has requested refill x 3 on sotalol 120 mg;Amanda [name] said she spoke with someone 05/01/12 and that sotalol was called in 04/28/12. [facility] said has not received and considered a denied request, and Amanda [name] wants med called to [facility] at [phone]. Amanda [name] request call back [phone].

## 2012-05-03 MED ORDER — SOTALOL HCL 120 MG PO TABS: 120.0000 mg | ORAL_TABLET | Freq: Two times a day (BID) | ORAL | Status: DC

## 2012-05-03 NOTE — Telephone Encounter (Signed)
Spoke with daughter and advised that I sent rx's on 04/28/12 along with other meds and they received those, will re-fax to [facility]

## 2012-05-16 DIAGNOSIS — H6120 Impacted cerumen, unspecified ear: Secondary | ICD-10-CM | POA: Diagnosis not present

## 2012-05-16 DIAGNOSIS — H903 Sensorineural hearing loss, bilateral: Secondary | ICD-10-CM | POA: Diagnosis not present

## 2012-05-22 ENCOUNTER — Ambulatory Visit (INDEPENDENT_AMBULATORY_CARE_PROVIDER_SITE_OTHER): Payer: Medicare Other | Admitting: General Practice

## 2012-05-22 DIAGNOSIS — Z5181 Encounter for therapeutic drug level monitoring: Secondary | ICD-10-CM | POA: Diagnosis not present

## 2012-05-22 DIAGNOSIS — Z7901 Long term (current) use of anticoagulants: Secondary | ICD-10-CM

## 2012-05-22 DIAGNOSIS — I4891 Unspecified atrial fibrillation: Secondary | ICD-10-CM | POA: Diagnosis not present

## 2012-05-22 LAB — POCT INR: INR: 2.3

## 2012-05-22 NOTE — Patient Instructions (Addendum)
Take 1/2 tablet all days except 1 tablet on Monday and Thursday. Re-check in 4 weeks.

## 2012-05-25 DIAGNOSIS — M79609 Pain in unspecified limb: Secondary | ICD-10-CM | POA: Diagnosis not present

## 2012-05-25 DIAGNOSIS — B351 Tinea unguium: Secondary | ICD-10-CM | POA: Diagnosis not present

## 2012-05-30 ENCOUNTER — Encounter: Payer: Self-pay | Admitting: Internal Medicine

## 2012-05-30 ENCOUNTER — Telehealth: Payer: Self-pay

## 2012-05-30 ENCOUNTER — Ambulatory Visit (INDEPENDENT_AMBULATORY_CARE_PROVIDER_SITE_OTHER): Payer: Medicare Other | Admitting: Internal Medicine

## 2012-05-30 VITALS — BP 130/60 | HR 78 | Temp 99.2°F | Resp 22 | Wt 133.0 lb

## 2012-05-30 DIAGNOSIS — J069 Acute upper respiratory infection, unspecified: Secondary | ICD-10-CM

## 2012-05-30 MED ORDER — CEFUROXIME AXETIL 250 MG PO TABS: 250.0000 mg | ORAL_TABLET | Freq: Two times a day (BID) | ORAL | Status: DC

## 2012-05-30 NOTE — Telephone Encounter (Signed)
Pt daughter [name] said pt has productive cough with white phlegm,drainage at back of throat,S/T and head and chest congestion. Pt was not able to rest due to cough; set up in chair all night. ? Fever. No wheezing or SOB. Pt's daughter wants pt seen prior to winter weather. [facility].

## 2012-05-30 NOTE — Telephone Encounter (Signed)
Amanda [name] scheduled pt to see Dr Silvio Pate today at 1 pm.

## 2012-05-30 NOTE — Progress Notes (Signed)
Subjective:    Patient ID: Amanda [name], female    DOB: [birth year], 79 y.o.   MRN: [id number]  HPI Here with daughter  Having headache--started 3 days ago Sore throat and head congestion Chest congestion now also Cough constantly now---had to sit up in chair last night Choking from post nasal drip ?some wheezing heard by Caryl Pina RN this AM at [facility]  Some sputum---foamy white. No blood No SOB at rest---but did get DOE walking to clinic this AM May have had fever--- had sweat 2 nights ago  Tried zycam--no clear help  Current Outpatient Prescriptions on File Prior to Visit  Medication Sig Dispense Refill  . acetaminophen (TYLENOL) 500 MG tablet Take 500 mg by mouth as needed.        . hydrochlorothiazide (HYDRODIURIL) 25 MG tablet Take 1 tablet by mouth  (25mg ) daily  90 tablet  3  . levothyroxine (SYNTHROID, LEVOTHROID) 50 MCG tablet Take 1 tablet by mouth  daily  90 tablet  3  . lisinopril (PRINIVIL,ZESTRIL) 40 MG tablet Take 1 tablet by mouth  (40mg ) daily  90 tablet  3  . mirtazapine (REMERON) 15 MG tablet Take 1 tablet by mouth at  bedtime  90 tablet  3  . nitroGLYCERIN (NITROSTAT) 0.4 MG SL tablet Place 0.4 mg under the tongue every 5 (five) minutes as needed.        Marland Kitchen omeprazole (PRILOSEC) 20 MG capsule Take 1 capsule by mouth  daily  90 capsule  3  . sotalol (BETAPACE) 120 MG tablet Take 1 tablet (120 mg total) by mouth 2 (two) times daily.  180 tablet  3  . warfarin (COUMADIN) 4 MG tablet Take one to two daily as directed.  180 tablet  3   No current facility-administered medications on file prior to visit.    Allergies  Allergen Reactions  . Amoxicillin Diarrhea    Weakness and atrial fib  . Augmentin (Amoxicillin-Pot Clavulanate) Diarrhea    Weakness and atrial fib  . Sulfonamide Derivatives     Past Medical History  Diagnosis Date  . Atrial fibrillation   . Hyperlipidemia   . Hypertension   . Hypothyroidism     after Rx for thyroid cancer  .  Osteoarthritis   . Sleep disturbance   . Urinary incontinence     usually from UTI's  . Mitral regurgitation   . Depression   . Pacemaker   . GERD (gastroesophageal reflux disease)     Past Surgical History  Procedure Laterality Date  . Tonsillectomy and adenoidectomy  1937  . Vesicovaginal fistula closure w/ tah  1972  . Goiter removed  1976    cancerous. Then got RAI  . Breast lumpectomy  1983  . Breast biopsy  2002  . Pacemaker insertion  5/07    after syncope  . Cataract extraction, bilateral      Family History  Problem Relation Age of Onset  . Diabetes Mother   . Stroke Sister   . Coronary artery disease Brother   . Prostate cancer Brother   . Breast cancer Neg Hx   . Colon cancer Neg Hx     History   Social History  . Marital Status: Widowed    Spouse Name: N/A    Number of Children: 2  . Years of Education: N/A   Occupational History  . homemaker    Social History Main Topics  . Smoking status: Never Smoker   . Smokeless tobacco: Never Used  .  Alcohol Use: No  . Drug Use: No  . Sexually Active: Not on file   Other Topics Concern  . Not on file   Social History Narrative   Widowed 2002. 1 local daughter. 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 Daughter [name]) is health care POA. Discussed DNR and she requests would not want tube feeds   Review of Systems No rash No vomiting or diarrhea Appetite is okay    Objective:   Physical Exam  Constitutional: She appears well-developed and well-nourished. No distress.  HENT:  Mouth/Throat: Oropharynx is clear and moist. No oropharyngeal exudate.  Slight frontal tenderness TMs normal Mild nasal swelling without much inflammation  Neck: Normal range of motion. Neck supple.  Pulmonary/Chest: Effort normal. No respiratory distress. She has no wheezes. She has no rales.  ?slight exp rhonchi  Lymphadenopathy:    She has no cervical adenopathy.  Skin: No rash  noted.          Assessment & Plan:

## 2012-05-30 NOTE — Telephone Encounter (Signed)
Okay to add at 1PM today

## 2012-05-30 NOTE — Assessment & Plan Note (Signed)
Both head and chest Probably viral Discussed supportive care Cefuroxime if worsens

## 2012-06-06 ENCOUNTER — Other Ambulatory Visit: Payer: Self-pay | Admitting: Internal Medicine

## 2012-06-06 NOTE — Telephone Encounter (Signed)
rx sent to pharmacy by e-script Spoke with daughter and advised results.

## 2012-06-06 NOTE — Telephone Encounter (Signed)
Left message on home and cell VM, asking patient or daughter to return my call.   Daughter returned my call, I asked was patient having symptoms of UTI and daughter stated pt was having burning and frequency and that Dr.Letvak gave patient this medication 3 years ago to have on hand and per daughter they would like a refill. Please advise

## 2012-06-06 NOTE — Telephone Encounter (Signed)
Okay to send Rx for #20 x 0 Can take for 3 days and if symptoms not resolved, will need visit

## 2012-06-13 ENCOUNTER — Ambulatory Visit (INDEPENDENT_AMBULATORY_CARE_PROVIDER_SITE_OTHER): Payer: Medicare Other | Admitting: Internal Medicine

## 2012-06-13 ENCOUNTER — Encounter: Payer: Self-pay | Admitting: Internal Medicine

## 2012-06-13 VITALS — BP 124/74 | HR 62 | Ht 62.0 in | Wt 132.2 lb

## 2012-06-13 DIAGNOSIS — I10 Essential (primary) hypertension: Secondary | ICD-10-CM

## 2012-06-13 DIAGNOSIS — R0602 Shortness of breath: Secondary | ICD-10-CM

## 2012-06-13 DIAGNOSIS — Z950 Presence of cardiac pacemaker: Secondary | ICD-10-CM

## 2012-06-13 DIAGNOSIS — I4891 Unspecified atrial fibrillation: Secondary | ICD-10-CM | POA: Diagnosis not present

## 2012-06-13 LAB — PACEMAKER DEVICE OBSERVATION
AL AMPLITUDE: 2 mv
AL IMPEDENCE PM: 697 Ohm
AL THRESHOLD: 0.75 V
BAMS-0001: 175 {beats}/min
RV LEAD AMPLITUDE: 4 mv

## 2012-06-13 MED ORDER — ATENOLOL 25 MG PO TABS: ORAL_TABLET | ORAL | Status: DC

## 2012-06-13 MED ORDER — SOTALOL HCL 120 MG PO TABS: 120.0000 mg | ORAL_TABLET | Freq: Every day | ORAL | Status: DC

## 2012-06-13 NOTE — Assessment & Plan Note (Signed)
Paroxysmal atrial fibrillation. We'll continue just the sotalol given the creatinine clearance. Part of the issue may be related to rate. We'll add low-dose atenolol. I have reviewed side effects with the patient and her daughter.

## 2012-06-13 NOTE — Assessment & Plan Note (Signed)
The patient's device was interrogated.  The information was reviewed. No changes were made in the programming.    

## 2012-06-13 NOTE — Patient Instructions (Addendum)
Your physician wants you to follow-up in: 6 months with Dr. Caryl Comes and device check. You will receive a reminder letter in the mail two months in advance. If you don't receive a letter, please call our office to schedule the follow-up appointment.  Your physician has recommended you make the following change in your medication:  -take sotalol 120 mg in the morning only -take atenolol 25 mg in the evening -decrease lisinopril to 20 mg daily

## 2012-06-13 NOTE — Assessment & Plan Note (Signed)
Well controlled. We'll cover lisinopril in half so to avoid hypotension

## 2012-06-13 NOTE — Assessment & Plan Note (Signed)
Creatinine clearance was stable the fall

## 2012-06-13 NOTE — Progress Notes (Signed)
Patient Care Team: Venia Carbon, MD as PCP - General   HPI  Amanda [name] is a 79 y.o. female seen in followup with a previously implanted pacemaker in 2007 for syncope and apparently tachybradycardia syndrome;  she has a history of paroxysmal atrial fibrillation managed with sotalol.    Last creatinine was 45 with an estimated creatinine clearance of 28;  potassium at that time was 4.0 and magnesium was 1.8 At the last visit we decreased the sotalolon creatinine clearance.  Her atrial fibrillation burden 0.2% although she has complaints of palpitations   She has had intercurrent problem with sinusitis    Past Medical History  Diagnosis Date  . Atrial fibrillation   . Hyperlipidemia   . Hypertension   . Hypothyroidism     after Rx for thyroid cancer  . Osteoarthritis   . Sleep disturbance   . Urinary incontinence     usually from UTI's  . Mitral regurgitation   . Depression   . Pacemaker   . GERD (gastroesophageal reflux disease)     Past Surgical History  Procedure Laterality Date  . Tonsillectomy and adenoidectomy  1937  . Vesicovaginal fistula closure w/ tah  1972  . Goiter removed  1976    cancerous. Then got RAI  . Breast lumpectomy  1983  . Breast biopsy  2002  . Pacemaker insertion  5/07    after syncope  . Cataract extraction, bilateral      Current Outpatient Prescriptions  Medication Sig Dispense Refill  . acetaminophen (TYLENOL) 500 MG tablet Take 500 mg by mouth as needed.        . ciprofloxacin (CIPRO) 250 MG tablet TAKE 1 TABLET BY MOUTH TWICE DAILY FOR BLADDER INFECTION  20 tablet  0  . hydrochlorothiazide (HYDRODIURIL) 25 MG tablet Take 1 tablet by mouth  (25mg ) daily  90 tablet  3  . levothyroxine (SYNTHROID, LEVOTHROID) 50 MCG tablet Take 1 tablet by mouth  daily  90 tablet  3  . lisinopril (PRINIVIL,ZESTRIL) 40 MG tablet Take 1 tablet by mouth  (40mg ) daily  90 tablet  3  . mirtazapine (REMERON) 15 MG tablet Take 1 tablet by mouth at  bedtime   90 tablet  3  . nitroGLYCERIN (NITROSTAT) 0.4 MG SL tablet Place 0.4 mg under the tongue every 5 (five) minutes as needed.        Marland Kitchen omeprazole (PRILOSEC) 20 MG capsule Take 1 capsule by mouth  daily  90 capsule  3  . sotalol (BETAPACE) 120 MG tablet Takes 1 tablets am and 1/2 tablet pm daily.      Marland Kitchen warfarin (COUMADIN) 4 MG tablet Take one to two daily as directed.  180 tablet  3   No current facility-administered medications for this visit.    Allergies  Allergen Reactions  . Amoxicillin Diarrhea    Weakness and atrial fib  . Augmentin (Amoxicillin-Pot Clavulanate) Diarrhea    Weakness and atrial fib  . Sulfonamide Derivatives     Review of Systems negative except from HPI and PMH  Physical Exam BP 124/74  Pulse 62  Ht 5\' 2"  (1.575 m)  Wt 132 lb 4 oz (59.988 kg)  BMI 24.18 kg/m2 Well developed and well nourished in no acute distress HENT normal E scleral and icterus clear Neck Supple JVP flat; carotids brisk and full Clear to ausculation egular rate and rhythm, no murmurs gallops or rub Soft with active bowel sounds No clubbing cyanosis none Edema Alert and oriented, grossly  normal motor and sensory function Skin Warm and Dry  ECG demonstrates sinus rhythm at 62 Intervals 20/08/42. Nonspecific ST-T changes

## 2012-06-19 ENCOUNTER — Ambulatory Visit (INDEPENDENT_AMBULATORY_CARE_PROVIDER_SITE_OTHER): Payer: Medicare Other | Admitting: General Practice

## 2012-06-19 DIAGNOSIS — I4891 Unspecified atrial fibrillation: Secondary | ICD-10-CM

## 2012-06-19 DIAGNOSIS — Z7901 Long term (current) use of anticoagulants: Secondary | ICD-10-CM | POA: Diagnosis not present

## 2012-06-19 DIAGNOSIS — Z5181 Encounter for therapeutic drug level monitoring: Secondary | ICD-10-CM

## 2012-06-19 NOTE — Patient Instructions (Signed)
Take 1 1/2 tablets today and take 1 tablet on Tuesday and then continue to take 1/2 tablet all days except 1 tablet on Monday and Thursday. Re-check in 2 weeks.

## 2012-07-03 ENCOUNTER — Ambulatory Visit (INDEPENDENT_AMBULATORY_CARE_PROVIDER_SITE_OTHER): Payer: Medicare Other | Admitting: General Practice

## 2012-07-03 DIAGNOSIS — I4891 Unspecified atrial fibrillation: Secondary | ICD-10-CM

## 2012-07-03 DIAGNOSIS — Z5181 Encounter for therapeutic drug level monitoring: Secondary | ICD-10-CM

## 2012-07-03 DIAGNOSIS — Z7901 Long term (current) use of anticoagulants: Secondary | ICD-10-CM | POA: Diagnosis not present

## 2012-07-03 NOTE — Patient Instructions (Signed)
Take 1 tablet today and then change dosage to 1 tablet all days except 1/2 on M/W/F.  Re-check in 2 weeks.

## 2012-07-04 ENCOUNTER — Encounter: Payer: Self-pay | Admitting: Internal Medicine

## 2012-07-04 ENCOUNTER — Ambulatory Visit (INDEPENDENT_AMBULATORY_CARE_PROVIDER_SITE_OTHER): Payer: Medicare Other | Admitting: Internal Medicine

## 2012-07-04 VITALS — BP 116/76 | HR 64 | Temp 98.3°F | Wt 130.8 lb

## 2012-07-04 DIAGNOSIS — I10 Essential (primary) hypertension: Secondary | ICD-10-CM | POA: Diagnosis not present

## 2012-07-04 DIAGNOSIS — I4891 Unspecified atrial fibrillation: Secondary | ICD-10-CM

## 2012-07-04 DIAGNOSIS — G479 Sleep disorder, unspecified: Secondary | ICD-10-CM | POA: Diagnosis not present

## 2012-07-04 DIAGNOSIS — E039 Hypothyroidism, unspecified: Secondary | ICD-10-CM | POA: Diagnosis not present

## 2012-07-04 LAB — BASIC METABOLIC PANEL
CO2: 28 mEq/L (ref 19–32)
Chloride: 98 mEq/L (ref 96–112)
Glucose, Bld: 104 mg/dL — ABNORMAL HIGH (ref 70–99)
Potassium: 4 mEq/L (ref 3.5–5.1)
Sodium: 138 mEq/L (ref 135–145)

## 2012-07-04 LAB — CBC WITH DIFFERENTIAL/PLATELET
Basophils Relative: 1.3 % (ref 0.0–3.0)
Eosinophils Absolute: 0.2 10*3/uL (ref 0.0–0.7)
Eosinophils Relative: 3.8 % (ref 0.0–5.0)
Lymphocytes Relative: 27 % (ref 12.0–46.0)
Neutrophils Relative %: 55.3 % (ref 43.0–77.0)
Platelets: 224 10*3/uL (ref 150.0–400.0)
RBC: 3.46 Mil/uL — ABNORMAL LOW (ref 3.87–5.11)
WBC: 6.3 10*3/uL (ref 4.5–10.5)

## 2012-07-04 LAB — HEPATIC FUNCTION PANEL
ALT: 13 U/L (ref 0–35)
Bilirubin, Direct: 0 mg/dL (ref 0.0–0.3)
Total Bilirubin: 0.6 mg/dL (ref 0.3–1.2)

## 2012-07-04 LAB — TSH: TSH: 6.6 u[IU]/mL — ABNORMAL HIGH (ref 0.35–5.50)

## 2012-07-04 NOTE — Assessment & Plan Note (Signed)
BP Readings from Last 3 Encounters:  07/04/12 116/76  06/13/12 124/74  05/30/12 130/60   Good control  meds changed and seems to be tolerating Will check labs

## 2012-07-04 NOTE — Progress Notes (Signed)
Subjective:    Patient ID: Amanda [name], female    DOB: May 16, 1919, 79 y.o.   MRN: [id number]  HPI Here with daughter Doing well Has needed adjustments with protimes  Dr Caryl Comes decreased the sotalol again Added atenolol Lisinopril decreased Occasionally feels palpitations ---usually brief (and causes nausea). Usually occurs if she is sick No chest pain  Still has some DOE ---has to rest after a while No recent change Still independent with all instrumental ADLs including laundry and cleaning  Still doesn't sleep great Trouble initiating Will nap during the day if she sits for a while---this may affect the night sleep Still gets ankle and leg pain at night---uses tylenol at times for this  Current Outpatient Prescriptions on File Prior to Visit  Medication Sig Dispense Refill  . acetaminophen (TYLENOL) 500 MG tablet Take 500 mg by mouth as needed.        Marland Kitchen atenolol (TENORMIN) 25 MG tablet Take 1 tablet in the evening  90 tablet  3  . hydrochlorothiazide (HYDRODIURIL) 25 MG tablet Take 1 tablet by mouth  (25mg ) daily  90 tablet  3  . levothyroxine (SYNTHROID, LEVOTHROID) 50 MCG tablet Take 1 tablet by mouth  daily  90 tablet  3  . mirtazapine (REMERON) 15 MG tablet Take 1 tablet by mouth at  bedtime  90 tablet  3  . nitroGLYCERIN (NITROSTAT) 0.4 MG SL tablet Place 0.4 mg under the tongue every 5 (five) minutes as needed.        Marland Kitchen omeprazole (PRILOSEC) 20 MG capsule Take 1 capsule by mouth  daily  90 capsule  3  . sotalol (BETAPACE) 120 MG tablet Take 1 tablet (120 mg total) by mouth daily. Take in am  30 tablet  3  . warfarin (COUMADIN) 4 MG tablet Take one to two daily as directed.  180 tablet  3   No current facility-administered medications on file prior to visit.    Allergies  Allergen Reactions  . Amoxicillin Diarrhea    Weakness and atrial fib  . Augmentin (Amoxicillin-Pot Clavulanate) Diarrhea    Weakness and atrial fib  . Sulfonamide Derivatives     Past Medical  History  Diagnosis Date  . Atrial fibrillation   . Hyperlipidemia   . Hypertension   . Hypothyroidism     after Rx for thyroid cancer  . Osteoarthritis   . Sleep disturbance   . Urinary incontinence     usually from UTI's  . Mitral regurgitation   . Depression   . Pacemaker   . GERD (gastroesophageal reflux disease)     Past Surgical History  Procedure Laterality Date  . Tonsillectomy and adenoidectomy  1937  . Vesicovaginal fistula closure w/ tah  1972  . Goiter removed  1976    cancerous. Then got RAI  . Breast lumpectomy  1983  . Breast biopsy  2002  . Pacemaker insertion  5/07    after syncope  . Cataract extraction, bilateral      Family History  Problem Relation Age of Onset  . Diabetes Mother   . Stroke Sister   . Coronary artery disease Brother   . Prostate cancer Brother   . Breast cancer Neg Hx   . Colon cancer Neg Hx     History   Social History  . Marital Status: Widowed    Spouse Name: N/A    Number of Children: 2  . Years of Education: N/A   Occupational History  . homemaker  Social History Main Topics  . Smoking status: Never Smoker   . Smokeless tobacco: Never Used  . Alcohol Use: No  . Drug Use: No  . Sexually Active: Not on file   Other Topics Concern  . Not on file   Social History Narrative   Widowed 2002. 1 local daughter. Other in [place]. Was homemaker. Farmed and gardened. 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 Daughter [name]) is health care POA. Discussed DNR and she requests would not want tube feeds   Review of Systems Appetite is fine Weight is stable    Objective:   Physical Exam  Constitutional: She appears well-developed and well-nourished. No distress.  Neck: Normal range of motion. Neck supple. No thyromegaly present.  Cardiovascular: Normal rate, regular rhythm, normal heart sounds and intact distal pulses.  Exam reveals no gallop.   No murmur heard. Pulmonary/Chest: Effort normal and breath sounds normal. No  respiratory distress. She has no wheezes. She has no rales.  Abdominal: Soft. There is no tenderness.  Musculoskeletal: She exhibits no edema and no tenderness.  Lymphadenopathy:    She has no cervical adenopathy.  Psychiatric: She has a normal mood and affect. Her behavior is normal.          Assessment & Plan:

## 2012-07-04 NOTE — Assessment & Plan Note (Signed)
Rare palpitations Still on coumadin

## 2012-07-04 NOTE — Assessment & Plan Note (Signed)
Seems euthyroid Will recheck labs

## 2012-07-04 NOTE — Assessment & Plan Note (Signed)
Still some problems Will continue the mirtazapine

## 2012-07-06 ENCOUNTER — Encounter: Payer: Self-pay | Admitting: *Deleted

## 2012-07-20 ENCOUNTER — Ambulatory Visit (INDEPENDENT_AMBULATORY_CARE_PROVIDER_SITE_OTHER): Payer: Medicare Other | Admitting: General Practice

## 2012-07-20 DIAGNOSIS — I4891 Unspecified atrial fibrillation: Secondary | ICD-10-CM | POA: Diagnosis not present

## 2012-07-20 DIAGNOSIS — Z5181 Encounter for therapeutic drug level monitoring: Secondary | ICD-10-CM

## 2012-07-20 DIAGNOSIS — Z7901 Long term (current) use of anticoagulants: Secondary | ICD-10-CM | POA: Diagnosis not present

## 2012-07-20 LAB — POCT INR: INR: 2

## 2012-07-20 NOTE — Patient Instructions (Signed)
Continue to take 1 tablet all days except 1/2 on M/W/F.  Re-check in 4 weeks.

## 2012-08-17 ENCOUNTER — Ambulatory Visit: Payer: Medicare Other

## 2012-08-21 ENCOUNTER — Ambulatory Visit (INDEPENDENT_AMBULATORY_CARE_PROVIDER_SITE_OTHER): Payer: Medicare Other | Admitting: General Practice

## 2012-08-21 DIAGNOSIS — Z7901 Long term (current) use of anticoagulants: Secondary | ICD-10-CM | POA: Diagnosis not present

## 2012-08-21 DIAGNOSIS — Z5181 Encounter for therapeutic drug level monitoring: Secondary | ICD-10-CM

## 2012-08-21 DIAGNOSIS — I4891 Unspecified atrial fibrillation: Secondary | ICD-10-CM | POA: Diagnosis not present

## 2012-08-21 LAB — POCT INR: INR: 2.1

## 2012-08-21 NOTE — Patient Instructions (Signed)
Continue to take 1 tablet all days except 1/2 on M/W/F.  Re-check in 4 weeks.

## 2012-09-11 ENCOUNTER — Encounter: Payer: Self-pay | Admitting: Internal Medicine

## 2012-09-12 ENCOUNTER — Ambulatory Visit (INDEPENDENT_AMBULATORY_CARE_PROVIDER_SITE_OTHER): Payer: Medicare Other | Admitting: *Deleted

## 2012-09-12 DIAGNOSIS — Z950 Presence of cardiac pacemaker: Secondary | ICD-10-CM | POA: Diagnosis not present

## 2012-09-12 DIAGNOSIS — I4891 Unspecified atrial fibrillation: Secondary | ICD-10-CM | POA: Diagnosis not present

## 2012-09-13 DIAGNOSIS — L819 Disorder of pigmentation, unspecified: Secondary | ICD-10-CM | POA: Diagnosis not present

## 2012-09-13 DIAGNOSIS — L570 Actinic keratosis: Secondary | ICD-10-CM | POA: Diagnosis not present

## 2012-09-14 DIAGNOSIS — H1045 Other chronic allergic conjunctivitis: Secondary | ICD-10-CM | POA: Diagnosis not present

## 2012-09-18 ENCOUNTER — Encounter: Payer: Self-pay | Admitting: Internal Medicine

## 2012-09-18 ENCOUNTER — Telehealth: Payer: Self-pay

## 2012-09-18 LAB — REMOTE PACEMAKER DEVICE
AL THRESHOLD: 0.75 V
BAMS-0001: 175 {beats}/min
RV LEAD AMPLITUDE: 8 mv
RV LEAD IMPEDENCE PM: 445 Ohm
RV LEAD THRESHOLD: 0.75 V
VENTRICULAR PACING PM: 1

## 2012-09-18 NOTE — Telephone Encounter (Signed)
dtr called back to say she was unable to send PPM transmission and asks that I call her back at [phone].

## 2012-09-18 NOTE — Telephone Encounter (Signed)
pts dtr called to say pt is experiencing new onset DOE. Says "this is not normal for her".  Says she is very winded after doing little tasks such as making up her bed.  Would like to see Dr. Caryl Comes today. I explained he is not in office today but has an opening tomm at 2.  She verb understanding.  Will send in PPM transmission in mean time. I will call [place] office to get results once she does this.

## 2012-09-18 NOTE — Telephone Encounter (Signed)
dtr informed Understanding verb

## 2012-09-18 NOTE — Telephone Encounter (Signed)
dtr says she was able to download transmission  I will await results

## 2012-09-18 NOTE — Telephone Encounter (Signed)
I spoke with Amanda [name] in device clinic who says they rec'd transmission and it showed some atrial fib episodes of 1.2%, last episode was 4/9 x 3 hours Will keep appt with SK tomm

## 2012-09-19 ENCOUNTER — Ambulatory Visit: Payer: Medicare Other | Admitting: Internal Medicine

## 2012-09-19 ENCOUNTER — Encounter: Payer: Self-pay | Admitting: Internal Medicine

## 2012-09-19 ENCOUNTER — Ambulatory Visit (INDEPENDENT_AMBULATORY_CARE_PROVIDER_SITE_OTHER): Payer: Medicare Other | Admitting: Internal Medicine

## 2012-09-19 VITALS — BP 112/52 | HR 69 | Ht 62.0 in | Wt 130.5 lb

## 2012-09-19 DIAGNOSIS — R0989 Other specified symptoms and signs involving the circulatory and respiratory systems: Secondary | ICD-10-CM

## 2012-09-19 DIAGNOSIS — I059 Rheumatic mitral valve disease, unspecified: Secondary | ICD-10-CM | POA: Diagnosis not present

## 2012-09-19 DIAGNOSIS — Z950 Presence of cardiac pacemaker: Secondary | ICD-10-CM | POA: Diagnosis not present

## 2012-09-19 DIAGNOSIS — I4891 Unspecified atrial fibrillation: Secondary | ICD-10-CM

## 2012-09-19 DIAGNOSIS — I495 Sick sinus syndrome: Secondary | ICD-10-CM | POA: Diagnosis not present

## 2012-09-19 DIAGNOSIS — R0602 Shortness of breath: Secondary | ICD-10-CM

## 2012-09-19 LAB — PACEMAKER DEVICE OBSERVATION
AL IMPEDENCE PM: 674 Ohm
ATRIAL PACING PM: 94
BATTERY VOLTAGE: 2.73 V
RV LEAD AMPLITUDE: 4 mv
RV LEAD IMPEDENCE PM: 430 Ohm

## 2012-09-19 MED ORDER — HYDROCHLOROTHIAZIDE 25 MG PO TABS: ORAL_TABLET | ORAL | Status: DC

## 2012-09-19 MED ORDER — LEVOTHYROXINE SODIUM 50 MCG PO TABS: ORAL_TABLET | ORAL | Status: DC

## 2012-09-19 NOTE — Progress Notes (Signed)
[facility] Patient Care Team: Venia Carbon, MD as PCP - General   HPI  Amanda [name] is a 79 y.o. female seen in followup with a previously implanted pacemaker in 2007 for syncope and apparently tachybradycardia syndrome; she has a history of paroxysmal atrial fibrillation managed with sotalol.  Last creatinine was 45 with an estimated creatinine clearance of 28; potassium at that time was 4.0 and magnesium was 1.8  At the last visit we decreased the sotalol 2/2 creatinine clearance. Because of issues concerning possible right contribution to symptoms we added low-dose atenolol  Since then she has had complaints of fatigue and worsening exercise tolerance. There has been no accompanying chest discomfort but there has been some peripheral edema.this has been particularly notable again over the last few days. Interrogation of her device demonstrates no concurrent atrial fibrillation  Blood work from March was reviewed. Her free T4 was normal but her TSH was elevated. Her hemoglobin was somewhat low but not terribly much lower than it had been.   Past Medical History  Diagnosis Date  . Atrial fibrillation   . Hyperlipidemia   . Hypertension   . Hypothyroidism     after Rx for thyroid cancer  . Osteoarthritis   . Sleep disturbance   . Urinary incontinence     usually from UTI's  . Mitral regurgitation   . Depression   . Pacemaker   . GERD (gastroesophageal reflux disease)     Past Surgical History  Procedure Laterality Date  . Tonsillectomy and adenoidectomy  1937  . Vesicovaginal fistula closure w/ tah  1972  . Goiter removed  1976    cancerous. Then got RAI  . Breast lumpectomy  1983  . Breast biopsy  2002  . Pacemaker insertion  5/07    after syncope  . Cataract extraction, bilateral      Current Outpatient Prescriptions  Medication Sig Dispense Refill  . acetaminophen (TYLENOL) 500 MG tablet Take 500 mg by mouth as needed.        Marland Kitchen atenolol (TENORMIN) 25 MG tablet Take 1  tablet in the evening  90 tablet  3  . hydrochlorothiazide (HYDRODIURIL) 25 MG tablet Take 1 tablet by mouth  (25mg ) daily  90 tablet  3  . levothyroxine (SYNTHROID, LEVOTHROID) 50 MCG tablet Take 1 tablet by mouth  daily  90 tablet  3  . lisinopril (PRINIVIL,ZESTRIL) 40 MG tablet Take 20 mg by mouth daily.       . mirtazapine (REMERON) 15 MG tablet Take 1 tablet by mouth at  bedtime  90 tablet  3  . nitroGLYCERIN (NITROSTAT) 0.4 MG SL tablet Place 0.4 mg under the tongue every 5 (five) minutes as needed.        Marland Kitchen omeprazole (PRILOSEC) 20 MG capsule Take 1 capsule by mouth  daily  90 capsule  3  . sotalol (BETAPACE) 120 MG tablet Take 1 tablet (120 mg total) by mouth daily. Take in am  30 tablet  3  . warfarin (COUMADIN) 4 MG tablet Take one to two daily as directed.  180 tablet  3   No current facility-administered medications for this visit.    Allergies  Allergen Reactions  . Amoxicillin Diarrhea    Weakness and atrial fib  . Augmentin (Amoxicillin-Pot Clavulanate) Diarrhea    Weakness and atrial fib  . Sulfonamide Derivatives     Review of Systems negative except from HPI and PMH  Physical Exam BP 112/52  Pulse 69  Ht 5'  2" (1.575 m)  Wt 130 lb 8 oz (59.194 kg)  BMI 23.86 kg/m2 Well developed and well nourished in no acute distress HENT normal E scleral and icterus clear Neck Supple JVP flat; carotids brisk and full Clear to ausculation  *Regular rate and rhythm, early systolic murmur and an S4 Soft with active bowel sounds No clubbing cyanosis 1+ Edema Alert and oriented, grossly normal motor and sensory function Skin Warm and Dry  ECG demonstrates atrial pacing with intrinsic conduction Intervals 13/0 8 x 38 axis is 43 Nonspecific ST-T changes unchanged from 2011  Assessment and  Plan

## 2012-09-19 NOTE — Assessment & Plan Note (Signed)
Currently well controlled.  

## 2012-09-19 NOTE — Assessment & Plan Note (Signed)
TSH was persistently elevated. I've taken the liberty of increasing her thyroid replacement is 62-1/2 by having her take 50 Monday Wednesday Friday and 75 Tuesday Thursday Saturday Sunday

## 2012-09-19 NOTE — Assessment & Plan Note (Addendum)
I am not sure of the mechanism of this. It does not seem to be related to atrial fibrillation although when she had atrial fibrillation she does go fast. It is concurrent with the use of the atenolol so we will stop it. She has some peripheral edema but no JVD. There may be a component of HFpEF to encourage her to use more diuretic when she notes peripheral edema  We'll plan to check her TSHAnd her metabolic profile and she returns 8 weeks to see Dr. Deidre Ala

## 2012-09-19 NOTE — Assessment & Plan Note (Signed)
Iwithout atrial fibrillation associated with her most recent symptoms prompts the question as to what her symptoms are caused by. She does have atrial fibrillation that is quite rapid but is relatively infrequent and short. Right now the atenolol may be worsening her symptoms and discontinue it. We will make no change in the sotalol. I

## 2012-09-19 NOTE — Patient Instructions (Addendum)
Your physician has recommended you make the following change in your medication:  -stop atenolol -change HCTZ to 25 mg daily except take 2 tablets daily x 3 days as needed for swelling -increase levothyroxine to 50 mcg Monday, Wednesday and Friday; take 75 mcg (1 1/2 tablets) Tuesday, Thursday, Saturday and Sunday  Your physician recommends that you return for lab work in: prior to next appointment with Dr. Rockey Situ.  Your physician wants you to follow-up in: 12/25/12 transmission. 6 months with Dr. Caryl Comes. 11/24/12 at 1100 with Dr. Rockey Situ. You will receive a reminder letter in the mail two months in advance. If you don't receive a letter, please call our office to schedule the follow-up appointment.

## 2012-09-21 ENCOUNTER — Ambulatory Visit (INDEPENDENT_AMBULATORY_CARE_PROVIDER_SITE_OTHER): Payer: Medicare Other | Admitting: Family Medicine

## 2012-09-21 DIAGNOSIS — Z7901 Long term (current) use of anticoagulants: Secondary | ICD-10-CM

## 2012-09-21 DIAGNOSIS — I4891 Unspecified atrial fibrillation: Secondary | ICD-10-CM

## 2012-09-21 DIAGNOSIS — Z5181 Encounter for therapeutic drug level monitoring: Secondary | ICD-10-CM | POA: Diagnosis not present

## 2012-09-21 LAB — POCT INR: INR: 2.2

## 2012-09-21 NOTE — Patient Instructions (Signed)
Continue to take 1 tablet all days except 1/2 on M/W/F.  Re-check in 6 weeks.

## 2012-09-26 ENCOUNTER — Other Ambulatory Visit: Payer: Self-pay | Admitting: *Deleted

## 2012-09-26 DIAGNOSIS — I059 Rheumatic mitral valve disease, unspecified: Secondary | ICD-10-CM

## 2012-09-26 DIAGNOSIS — R0602 Shortness of breath: Secondary | ICD-10-CM

## 2012-09-26 DIAGNOSIS — I4891 Unspecified atrial fibrillation: Secondary | ICD-10-CM

## 2012-09-26 DIAGNOSIS — Z950 Presence of cardiac pacemaker: Secondary | ICD-10-CM

## 2012-09-26 DIAGNOSIS — I495 Sick sinus syndrome: Secondary | ICD-10-CM

## 2012-09-26 MED ORDER — HYDROCHLOROTHIAZIDE 25 MG PO TABS: ORAL_TABLET | ORAL | Status: DC

## 2012-09-26 NOTE — Telephone Encounter (Signed)
Refilled Hydrochlorothiazide sent to [facility] pharmacy.

## 2012-10-12 ENCOUNTER — Encounter: Payer: Self-pay | Admitting: *Deleted

## 2012-10-30 ENCOUNTER — Ambulatory Visit (INDEPENDENT_AMBULATORY_CARE_PROVIDER_SITE_OTHER): Payer: Medicare Other | Admitting: Family Medicine

## 2012-10-30 DIAGNOSIS — Z5181 Encounter for therapeutic drug level monitoring: Secondary | ICD-10-CM | POA: Diagnosis not present

## 2012-10-30 DIAGNOSIS — Z7901 Long term (current) use of anticoagulants: Secondary | ICD-10-CM | POA: Diagnosis not present

## 2012-10-30 DIAGNOSIS — I4891 Unspecified atrial fibrillation: Secondary | ICD-10-CM

## 2012-10-30 NOTE — Patient Instructions (Signed)
Hold Coumadin today (7/14) and then continue 1 tablet all days except 1/2 tableton M/W/F.  Re-check in 4 weeks.

## 2012-11-14 ENCOUNTER — Ambulatory Visit (INDEPENDENT_AMBULATORY_CARE_PROVIDER_SITE_OTHER): Payer: Medicare Other

## 2012-11-14 DIAGNOSIS — I4891 Unspecified atrial fibrillation: Secondary | ICD-10-CM

## 2012-11-14 DIAGNOSIS — H903 Sensorineural hearing loss, bilateral: Secondary | ICD-10-CM | POA: Diagnosis not present

## 2012-11-14 DIAGNOSIS — R0602 Shortness of breath: Secondary | ICD-10-CM | POA: Diagnosis not present

## 2012-11-14 DIAGNOSIS — I059 Rheumatic mitral valve disease, unspecified: Secondary | ICD-10-CM | POA: Diagnosis not present

## 2012-11-14 DIAGNOSIS — J3489 Other specified disorders of nose and nasal sinuses: Secondary | ICD-10-CM | POA: Diagnosis not present

## 2012-11-14 DIAGNOSIS — H6120 Impacted cerumen, unspecified ear: Secondary | ICD-10-CM | POA: Diagnosis not present

## 2012-11-14 DIAGNOSIS — I495 Sick sinus syndrome: Secondary | ICD-10-CM | POA: Diagnosis not present

## 2012-11-14 DIAGNOSIS — Z950 Presence of cardiac pacemaker: Secondary | ICD-10-CM

## 2012-11-15 LAB — BASIC METABOLIC PANEL
BUN: 29 mg/dL (ref 10–36)
CO2: 27 mmol/L (ref 18–29)
Chloride: 101 mmol/L (ref 97–108)
Creatinine, Ser: 1.26 mg/dL — ABNORMAL HIGH (ref 0.57–1.00)
Glucose: 106 mg/dL — ABNORMAL HIGH (ref 65–99)

## 2012-11-15 LAB — TSH: TSH: 0.115 u[IU]/mL — ABNORMAL LOW (ref 0.450–4.500)

## 2012-11-24 ENCOUNTER — Ambulatory Visit (INDEPENDENT_AMBULATORY_CARE_PROVIDER_SITE_OTHER): Payer: Medicare Other | Admitting: Cardiovascular Disease

## 2012-11-24 ENCOUNTER — Encounter: Payer: Self-pay | Admitting: Cardiovascular Disease

## 2012-11-24 VITALS — BP 110/62 | HR 65 | Ht 63.0 in | Wt 129.5 lb

## 2012-11-24 DIAGNOSIS — E785 Hyperlipidemia, unspecified: Secondary | ICD-10-CM | POA: Diagnosis not present

## 2012-11-24 DIAGNOSIS — I10 Essential (primary) hypertension: Secondary | ICD-10-CM

## 2012-11-24 DIAGNOSIS — R0789 Other chest pain: Secondary | ICD-10-CM | POA: Diagnosis not present

## 2012-11-24 DIAGNOSIS — E039 Hypothyroidism, unspecified: Secondary | ICD-10-CM

## 2012-11-24 DIAGNOSIS — I4891 Unspecified atrial fibrillation: Secondary | ICD-10-CM

## 2012-11-24 NOTE — Assessment & Plan Note (Signed)
Rare episodes of palpitations. She did not seem particularly symptomatic. We'll continue current medications. Followed by Dr. Caryl Comes.

## 2012-11-24 NOTE — Patient Instructions (Addendum)
Please take Levothyroxine 50 mcg Monday Wednesday Friday and 75 mcg Tuesday Thursday Saturday Sunday  Don't take extra HCTZ (water pill), drink fluids  Call the office if you start to have dizzy spells   Please call us if you have new issues that need to be addressed before your next appt.  Your physician wants you to follow-up in: 6 months.  You will receive a reminder letter in the mail two months in advance. If you don't receive a letter, please call our office to schedule the follow-up appointment.

## 2012-11-24 NOTE — Progress Notes (Signed)
Patient ID: Amanda [name], female    DOB: 03-02-1920, 79 y.o.   MRN: [id number]  HPI Comments: Amanda [name] is a pleasant 79 year old woman who  has a history of paroxysmal atrial fibrillation, normal systolic function by echocardiogram in 2007 with  MR, mild aortic valve regurgitation, mildly dilated left atrium, Holter monitor and April 2007 showing frequent and complex supraventricular arrhythmia, cardiac CTA showing no significant coronary artery disease with atherosclerotic plaque in the thoracic aorta who has been managed on sotalol for rhythm control, pacer placed in May 2007 with a history of "fainting "prior to that who presents for routine followup.  Overall, she states that she has been doing well.  She does report occasional episodes of palpitations. These do not last very long. Typically at nighttime. Continues to have a hoarse voice, likely from prior goiter surgery. She seems to be confused about her thyroid medication and is taking 75 mg only 3 times per week.   She is relatively sedentary, does walk  during the daytime. She lives at [address] [place]. She has had several falls this year and last year.   Cardiac catheterization in April 2007 showing no significant coronary artery disease.  EKG shows normal sinus rhythm with rate 65 beats per minute with nonspecific ST abnormality   Outpatient Encounter Prescriptions as of 11/24/2012  Medication Sig Dispense Refill  . acetaminophen (TYLENOL) 500 MG tablet Take 500 mg by mouth as needed.        . hydrochlorothiazide (HYDRODIURIL) 25 MG tablet Take 25 mg daily except 50 mg qd x 3days as needed for swelling  90 tablet  3  . levothyroxine (SYNTHROID, LEVOTHROID) 50 MCG tablet Takes 1 1/2 tablet daily.      Marland Kitchen lisinopril (PRINIVIL,ZESTRIL) 40 MG tablet Take 20 mg by mouth daily.       . mirtazapine (REMERON) 15 MG tablet Take 1 tablet by mouth at  bedtime  90 tablet  3  . nitroGLYCERIN (NITROSTAT) 0.4 MG SL tablet Place 0.4 mg under the tongue  every 5 (five) minutes as needed.        Marland Kitchen omeprazole (PRILOSEC) 20 MG capsule Take 1 capsule by mouth  daily  90 capsule  3  . sotalol (BETAPACE) 120 MG tablet Take 1 tablet (120 mg total) by mouth daily. Take in am  30 tablet  3  . warfarin (COUMADIN) 4 MG tablet Take one to two daily as directed.  180 tablet  3  . [DISCONTINUED] levothyroxine (SYNTHROID, LEVOTHROID) 50 MCG tablet Take 1 tablet Monday, Wednesday and Friday, 1 1/2 tablet Tuesday, Thursday, Saturday and Sunday  72 tablet  3   No facility-administered encounter medications on file as of 11/24/2012.     Review of Systems  Constitutional: Negative.   HENT: Negative.   Eyes: Negative.   Respiratory: Negative.   Cardiovascular: Positive for palpitations.  Gastrointestinal: Negative.   Musculoskeletal: Positive for gait problem.  Skin: Negative.   Neurological: Negative.   Psychiatric/Behavioral: Negative.   All other systems reviewed and are negative.    BP 110/62  Pulse 65  Ht 5\' 3"  (1.6 m)  Wt 129 lb 8 oz (58.741 kg)  BMI 22.95 kg/m2\  Physical Exam  Nursing note and vitals reviewed. Constitutional: She is oriented to person, place, and time. She appears well-developed and well-nourished.  HENT:  Head: Normocephalic.  Nose: Nose normal.  Mouth/Throat: Oropharynx is clear and moist.  Eyes: Conjunctivae are normal. Pupils are equal, round, and reactive to light.  Neck: Normal range of motion. Neck supple. No JVD present.  Cardiovascular: Normal rate, regular rhythm, S1 normal, S2 normal, normal heart sounds and intact distal pulses.  Exam reveals no gallop and no friction rub.   No murmur heard. Pulmonary/Chest: Effort normal and breath sounds normal. No respiratory distress. She has no wheezes. She has no rales. She exhibits no tenderness.  Abdominal: Soft. Bowel sounds are normal. She exhibits no distension. There is no tenderness.  Musculoskeletal: Normal range of motion. She exhibits no edema and no  tenderness.  Lymphadenopathy:    She has no cervical adenopathy.  Neurological: She is alert and oriented to person, place, and time. Coordination normal.  Skin: Skin is warm and dry. No rash noted. No erythema.  Psychiatric: She has a normal mood and affect. Her behavior is normal. Judgment and thought content normal.    Assessment and Plan

## 2012-11-24 NOTE — Assessment & Plan Note (Signed)
She is only taking thyroid medication 75 mcg 3 days per week. Likely from medication confusion. We have suggested she alternate 75 mg and 50 mg daily.

## 2012-11-24 NOTE — Assessment & Plan Note (Signed)
Previously he had very elevated cholesterol. No recent lipid panel available

## 2012-11-24 NOTE — Assessment & Plan Note (Signed)
Blood pressure is well controlled on today's visit. No changes made to the medications. I've asked her to monitor her blood pressure at home. Blood pressure borderline low. If he continues to be low at home, we would decrease the lisinopril down to 10 mg daily.

## 2012-11-27 ENCOUNTER — Ambulatory Visit (INDEPENDENT_AMBULATORY_CARE_PROVIDER_SITE_OTHER): Payer: Medicare Other | Admitting: Family Medicine

## 2012-11-27 DIAGNOSIS — Z5181 Encounter for therapeutic drug level monitoring: Secondary | ICD-10-CM | POA: Diagnosis not present

## 2012-11-27 DIAGNOSIS — I4891 Unspecified atrial fibrillation: Secondary | ICD-10-CM

## 2012-11-27 DIAGNOSIS — Z7901 Long term (current) use of anticoagulants: Secondary | ICD-10-CM

## 2012-11-27 LAB — POCT INR: INR: 3.4

## 2012-11-27 NOTE — Patient Instructions (Signed)
Skip Coumadin today (8/11) and then continue 1 tablet all days except 1/2 tablet on M/W/F.  Encouraged pt to add green vegetables back into diet in moderation.  Re-check in 4 weeks.

## 2012-12-21 DIAGNOSIS — B351 Tinea unguium: Secondary | ICD-10-CM | POA: Diagnosis not present

## 2012-12-21 DIAGNOSIS — M79609 Pain in unspecified limb: Secondary | ICD-10-CM | POA: Diagnosis not present

## 2012-12-25 ENCOUNTER — Ambulatory Visit (INDEPENDENT_AMBULATORY_CARE_PROVIDER_SITE_OTHER): Payer: Medicare Other | Admitting: Family Medicine

## 2012-12-25 ENCOUNTER — Ambulatory Visit (INDEPENDENT_AMBULATORY_CARE_PROVIDER_SITE_OTHER): Payer: Medicare Other | Admitting: *Deleted

## 2012-12-25 DIAGNOSIS — I4891 Unspecified atrial fibrillation: Secondary | ICD-10-CM | POA: Diagnosis not present

## 2012-12-25 DIAGNOSIS — Z5181 Encounter for therapeutic drug level monitoring: Secondary | ICD-10-CM

## 2012-12-25 DIAGNOSIS — I495 Sick sinus syndrome: Secondary | ICD-10-CM | POA: Diagnosis not present

## 2012-12-25 DIAGNOSIS — Z7901 Long term (current) use of anticoagulants: Secondary | ICD-10-CM | POA: Diagnosis not present

## 2012-12-25 LAB — POCT INR: INR: 2.7

## 2012-12-25 NOTE — Patient Instructions (Signed)
Continue 1 tablet all days except 1/2 tablet on M/W/F.  Re-check in 4 weeks.

## 2013-01-01 LAB — REMOTE PACEMAKER DEVICE
AL IMPEDENCE PM: 641 Ohm
ATRIAL PACING PM: 94
BAMS-0001: 175 {beats}/min
BATTERY VOLTAGE: 2.73 V
RV LEAD IMPEDENCE PM: 424 Ohm
VENTRICULAR PACING PM: 1

## 2013-01-09 ENCOUNTER — Encounter: Payer: Self-pay | Admitting: Internal Medicine

## 2013-01-09 ENCOUNTER — Ambulatory Visit (INDEPENDENT_AMBULATORY_CARE_PROVIDER_SITE_OTHER): Payer: Medicare Other | Admitting: Internal Medicine

## 2013-01-09 VITALS — BP 140/60 | HR 60 | Temp 97.6°F | Wt 129.1 lb

## 2013-01-09 DIAGNOSIS — F329 Major depressive disorder, single episode, unspecified: Secondary | ICD-10-CM

## 2013-01-09 DIAGNOSIS — I10 Essential (primary) hypertension: Secondary | ICD-10-CM | POA: Diagnosis not present

## 2013-01-09 DIAGNOSIS — R0982 Postnasal drip: Secondary | ICD-10-CM | POA: Diagnosis not present

## 2013-01-09 DIAGNOSIS — Z23 Encounter for immunization: Secondary | ICD-10-CM | POA: Diagnosis not present

## 2013-01-09 DIAGNOSIS — E039 Hypothyroidism, unspecified: Secondary | ICD-10-CM

## 2013-01-09 DIAGNOSIS — I4891 Unspecified atrial fibrillation: Secondary | ICD-10-CM

## 2013-01-09 LAB — TSH: TSH: 0.67 u[IU]/mL (ref 0.35–5.50)

## 2013-01-09 NOTE — Assessment & Plan Note (Signed)
Due for labs on the reduced dose

## 2013-01-09 NOTE — Assessment & Plan Note (Signed)
Some melancholy but stable Will continue the mirtazapine

## 2013-01-09 NOTE — Assessment & Plan Note (Signed)
BP Readings from Last 3 Encounters:  01/09/13 140/60  11/24/12 110/62  09/19/12 112/52   Good control No changes needed

## 2013-01-09 NOTE — Assessment & Plan Note (Signed)
Seems to be the reason for the throat symptoms  Will try loratadine

## 2013-01-09 NOTE — Assessment & Plan Note (Signed)
Regular and rate controlled On coumadin

## 2013-01-09 NOTE — Progress Notes (Signed)
Subjective:    Patient ID: Amanda [name], female    DOB: 1920-03-16, 79 y.o.   MRN: [id number]  HPI Here with daughter  "I have a problem" Has sinus drainage and has to spit it out Voice is off now ?lump above her sternal notch No heartburn and no swallowing problems No hay fever problems----hasn't tried any meds except cough drop Goes back some time--but worse now No SOB Some head congestion but no consistently  No palpitations No chest pain Gets DOE with chores--has to rest several times while vacuuming ----mostly relates to her legs Still does all instrumental ADLs  Thyroid dose adjusted Taking 50 mcg 4 days per week-- 75 mcg 3 days per week Due for recheck  Not depressed Still variable with sleep---good nights and some bad nights Does have some daytime tiredness  Current Outpatient Prescriptions on File Prior to Visit  Medication Sig Dispense Refill  . acetaminophen (TYLENOL) 500 MG tablet Take 500 mg by mouth as needed.        Marland Kitchen levothyroxine (SYNTHROID, LEVOTHROID) 50 MCG tablet Take 12mcg on Tuesday, Thursday, Saturday and Sunday, 50 mcg on Monday, Wednesday and Friday      . lisinopril (PRINIVIL,ZESTRIL) 40 MG tablet Take 20 mg by mouth daily.       . mirtazapine (REMERON) 15 MG tablet Take 1 tablet by mouth at  bedtime  90 tablet  3  . nitroGLYCERIN (NITROSTAT) 0.4 MG SL tablet Place 0.4 mg under the tongue every 5 (five) minutes as needed.        Marland Kitchen omeprazole (PRILOSEC) 20 MG capsule Take 1 capsule by mouth  daily  90 capsule  3  . sotalol (BETAPACE) 120 MG tablet Take 1 tablet (120 mg total) by mouth daily. Take in am  30 tablet  3  . warfarin (COUMADIN) 4 MG tablet Take one to two daily as directed.  180 tablet  3   No current facility-administered medications on file prior to visit.    Allergies  Allergen Reactions  . Amoxicillin Diarrhea    Weakness and atrial fib  . Augmentin [Amoxicillin-Pot Clavulanate] Diarrhea    Weakness and atrial fib  .  Sulfonamide Derivatives     Past Medical History  Diagnosis Date  . Atrial fibrillation   . Hyperlipidemia   . Hypertension   . Hypothyroidism     after Rx for thyroid cancer  . Osteoarthritis   . Sleep disturbance   . Urinary incontinence     usually from UTI's  . Mitral regurgitation   . Depression   . Pacemaker   . GERD (gastroesophageal reflux disease)     Past Surgical History  Procedure Laterality Date  . Tonsillectomy and adenoidectomy  1937  . Vesicovaginal fistula closure w/ tah  1972  . Goiter removed  1976    cancerous. Then got RAI  . Breast lumpectomy  1983  . Breast biopsy  2002  . Pacemaker insertion  5/07    after syncope  . Cataract extraction, bilateral      Family History  Problem Relation Age of Onset  . Diabetes Mother   . Stroke Sister   . Coronary artery disease Brother   . Prostate cancer Brother   . Breast cancer Neg Hx   . Colon cancer Neg Hx     History   Social History  . Marital Status: Widowed    Spouse Name: N/A    Number of Children: 2  . Years of Education: N/A  Occupational History  . homemaker    Social History Main Topics  . Smoking status: Never Smoker   . Smokeless tobacco: Never Used  . Alcohol Use: No  . Drug Use: No  . Sexual Activity: Not on file   Other Topics Concern  . Not on file   Social History Narrative   Widowed 2002. 1 local daughter. 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 Daughter [name]) is health care POA. Discussed DNR and she requests would not want tube feeds   Review of Systems Appetite is fine Weight stable Bowels are usually okay---may have loose stools once a week    Objective:   Physical Exam  Constitutional: She appears well-developed and well-nourished. No distress.  HENT:  Mouth/Throat: Oropharynx is clear and moist. No oropharyngeal exudate.  Mild nasal congestion  Neck: Normal range of motion. Neck supple. No thyromegaly present.   Skin thickening above sternal notch No mass  Cardiovascular: Normal rate, regular rhythm and normal heart sounds.  Exam reveals no gallop.   No murmur heard. Pulmonary/Chest: Effort normal and breath sounds normal. No respiratory distress. She has no wheezes. She has no rales.  Abdominal: Soft. There is no tenderness.  Musculoskeletal: She exhibits no edema and no tenderness.  Lymphadenopathy:    She has no cervical adenopathy.  Psychiatric: She has a normal mood and affect. Her behavior is normal.          Assessment & Plan:

## 2013-01-09 NOTE — Patient Instructions (Signed)
Please try loratadine 10mg  once or twice a day to see if that helps the drainage.

## 2013-01-09 NOTE — Addendum Note (Signed)
Addended by: Despina Hidden on: 01/09/2013 03:16 PM   Modules accepted: Orders

## 2013-01-10 ENCOUNTER — Encounter: Payer: Self-pay | Admitting: *Deleted

## 2013-01-22 ENCOUNTER — Ambulatory Visit: Payer: Medicare Other

## 2013-01-22 ENCOUNTER — Telehealth: Payer: Self-pay | Admitting: *Deleted

## 2013-01-22 NOTE — Telephone Encounter (Signed)
LMOV[facility] to bring pt in to decrease Atrial sensitivity per Dr. Caryl Comes.

## 2013-01-23 ENCOUNTER — Ambulatory Visit (INDEPENDENT_AMBULATORY_CARE_PROVIDER_SITE_OTHER): Payer: Medicare Other | Admitting: *Deleted

## 2013-01-23 DIAGNOSIS — I495 Sick sinus syndrome: Secondary | ICD-10-CM

## 2013-01-23 LAB — PACEMAKER DEVICE OBSERVATION

## 2013-01-23 NOTE — Progress Notes (Signed)
Changed sensitivity due to sensing issues on [facility] transmission. Follow up as planned.

## 2013-01-25 ENCOUNTER — Ambulatory Visit (INDEPENDENT_AMBULATORY_CARE_PROVIDER_SITE_OTHER): Payer: Medicare Other | Admitting: Family Medicine

## 2013-01-25 DIAGNOSIS — Z7901 Long term (current) use of anticoagulants: Secondary | ICD-10-CM

## 2013-01-25 DIAGNOSIS — Z5181 Encounter for therapeutic drug level monitoring: Secondary | ICD-10-CM

## 2013-01-25 DIAGNOSIS — I4891 Unspecified atrial fibrillation: Secondary | ICD-10-CM

## 2013-01-25 DIAGNOSIS — L570 Actinic keratosis: Secondary | ICD-10-CM | POA: Diagnosis not present

## 2013-01-25 DIAGNOSIS — L821 Other seborrheic keratosis: Secondary | ICD-10-CM | POA: Diagnosis not present

## 2013-01-25 DIAGNOSIS — L819 Disorder of pigmentation, unspecified: Secondary | ICD-10-CM | POA: Diagnosis not present

## 2013-01-25 LAB — POCT INR: INR: 2.8

## 2013-01-25 NOTE — Patient Instructions (Signed)
Continue 1 tablet all days except 1/2 tablet on M/W/F.  Re-check in 4 weeks.

## 2013-02-12 ENCOUNTER — Encounter: Payer: Self-pay | Admitting: Internal Medicine

## 2013-02-22 ENCOUNTER — Ambulatory Visit (INDEPENDENT_AMBULATORY_CARE_PROVIDER_SITE_OTHER): Payer: Medicare Other | Admitting: Family Medicine

## 2013-02-22 ENCOUNTER — Ambulatory Visit: Payer: Medicare Other

## 2013-02-22 DIAGNOSIS — Z7901 Long term (current) use of anticoagulants: Secondary | ICD-10-CM | POA: Diagnosis not present

## 2013-02-22 DIAGNOSIS — I4891 Unspecified atrial fibrillation: Secondary | ICD-10-CM

## 2013-02-22 DIAGNOSIS — Z5181 Encounter for therapeutic drug level monitoring: Secondary | ICD-10-CM

## 2013-02-22 NOTE — Patient Instructions (Signed)
Continue 1 tablet all days except 1/2 tablet on M/W/F.  Re-check in 4 weeks.

## 2013-03-22 ENCOUNTER — Encounter: Payer: Self-pay | Admitting: Podiatrist

## 2013-03-22 ENCOUNTER — Ambulatory Visit (INDEPENDENT_AMBULATORY_CARE_PROVIDER_SITE_OTHER): Payer: Medicare Other | Admitting: Internal Medicine

## 2013-03-22 ENCOUNTER — Ambulatory Visit (INDEPENDENT_AMBULATORY_CARE_PROVIDER_SITE_OTHER): Payer: Medicare Other | Admitting: Podiatrist

## 2013-03-22 ENCOUNTER — Encounter: Payer: Self-pay | Admitting: Internal Medicine

## 2013-03-22 ENCOUNTER — Ambulatory Visit (INDEPENDENT_AMBULATORY_CARE_PROVIDER_SITE_OTHER): Payer: Medicare Other | Admitting: Family Medicine

## 2013-03-22 VITALS — BP 140/84 | HR 67 | Ht 62.0 in | Wt 130.2 lb

## 2013-03-22 VITALS — BP 131/70 | HR 86 | Resp 16 | Ht 62.0 in | Wt 130.0 lb

## 2013-03-22 DIAGNOSIS — R0609 Other forms of dyspnea: Secondary | ICD-10-CM

## 2013-03-22 DIAGNOSIS — Z950 Presence of cardiac pacemaker: Secondary | ICD-10-CM | POA: Diagnosis not present

## 2013-03-22 DIAGNOSIS — Z5181 Encounter for therapeutic drug level monitoring: Secondary | ICD-10-CM

## 2013-03-22 DIAGNOSIS — M79609 Pain in unspecified limb: Secondary | ICD-10-CM

## 2013-03-22 DIAGNOSIS — Z7901 Long term (current) use of anticoagulants: Secondary | ICD-10-CM | POA: Diagnosis not present

## 2013-03-22 DIAGNOSIS — I495 Sick sinus syndrome: Secondary | ICD-10-CM | POA: Diagnosis not present

## 2013-03-22 DIAGNOSIS — I4891 Unspecified atrial fibrillation: Secondary | ICD-10-CM | POA: Diagnosis not present

## 2013-03-22 DIAGNOSIS — G479 Sleep disorder, unspecified: Secondary | ICD-10-CM

## 2013-03-22 DIAGNOSIS — B351 Tinea unguium: Secondary | ICD-10-CM

## 2013-03-22 LAB — MDC_IDC_ENUM_SESS_TYPE_INCLINIC
Battery Impedance: 2028 Ohm
Battery Remaining Longevity: 25 mo
Brady Statistic AP VP Percent: 1 %
Brady Statistic AS VS Percent: 4 %
Lead Channel Impedance Value: 456 Ohm
Lead Channel Impedance Value: 738 Ohm
Lead Channel Pacing Threshold Amplitude: 1 V
Lead Channel Pacing Threshold Pulse Width: 0.4 ms
Lead Channel Sensing Intrinsic Amplitude: 2.8 mV
Lead Channel Setting Pacing Amplitude: 2 V
Lead Channel Setting Pacing Amplitude: 2.5 V
Lead Channel Setting Pacing Pulse Width: 0.4 ms
Lead Channel Setting Sensing Sensitivity: 2 mV

## 2013-03-22 LAB — POCT INR: INR: 2.9

## 2013-03-22 NOTE — Assessment & Plan Note (Signed)
Paroxysmal. On warfarin. Reasonable heart rate excursion based on history and data

## 2013-03-22 NOTE — Patient Instructions (Signed)

## 2013-03-22 NOTE — Assessment & Plan Note (Addendum)
I don't think that this can be attributed to her heart rhythm. I suspect she has some degree of stress test based on her age and history of hypertension. I will plan to get an echo when she comes in to see Dr. Deidre Ala next month.  I suggested to her daughter that she experiment with periodic increases of her hydrochlorothiazide in the context of her worsening edema and see whether dyspnea improved. It might be appropriate to consider a loop diuretic

## 2013-03-22 NOTE — Assessment & Plan Note (Signed)
Heart rate excursion seems appropriate for exercise.

## 2013-03-22 NOTE — Patient Instructions (Signed)
Your physician has requested that you have an echocardiogram the day you see Dr. Rockey Situ in February. Echocardiography is a painless test that uses sound waves to create images of your heart. It provides your doctor with information about the size and shape of your heart and how well your heart's chambers and valves are working. This procedure takes approximately one hour. There are no restrictions for this procedure.  Remote monitoring is used to monitor your Pacemaker of ICD from home. This monitoring reduces the number of office visits required to check your device to one time per year. It allows [place] to keep an eye on the functioning of your device to ensure it is working properly. You are scheduled for a device check from home on June 25, 2013. You may send your transmission at any time that day. If you have a wireless device, the transmission will be sent automatically. After your physician reviews your transmission, you will receive a postcard with your next transmission date.  Your physician wants you to follow-up in: 1 year with Dr. Caryl Comes. You will receive a reminder letter in the mail two months in advance. If you don't receive a letter, please call our office to schedule the follow-up appointment.  Your physician recommends that you continue on your current medications as directed. Please refer to the Current Medication list given to you today.

## 2013-03-22 NOTE — Progress Notes (Signed)
  HPI:  Patient presents today for follow up of foot and nail care. Denies any new complaints today. Presents today with her daughter.  Complaining of "poor circulation" due to her feet being cold.    Objective:  Patients chart is reviewed.  Neurovascular status unchanged.  Pulses are easily palpable bilateral dp and pt.  Patients nails are thickened, discolored, distrophic, friable and brittle with yellow-brown discoloration especially bilateral hallux nails which are the most symptomatic Patient subjectively relates they are painful with shoes and with ambulation of bilateral feet.  Assessment:  Symptomatic onychomycosis  Plan:  Discussed treatment options and alternatives.  The symptomatic toenails were debrided through manual an mechanical means without complication.  Return appointment recommended at routine intervals of 3 months    Trudie Buckler, DPM

## 2013-03-22 NOTE — Progress Notes (Signed)
Patient Care Team: Venia Carbon, MD as PCP - General   HPI  Amanda [name] is a 79 y.o. female seen in followup with a previously implanted pacemaker in 2007 for syncope and apparently tachybradycardia syndrome; she has a history of paroxysmal atrial fibrillation managed with sotalol.  Last creatinine was 45 with an estimated creatinine clearance of 28; potassium at that time was 4.0 and magnesium was 1.8  At the last visit we decreased the sotalol on creatinine clearance.  Her atrial fibrillation burden 0.2% although she has complaints of palpitations  Last echocardiogram was in [place] EF was about 65%  Is her daughter's impression that her shortness of breath is worse with exertion and progressively so.she had issues intermittently with edema.   Past Medical History  Diagnosis Date  . Atrial fibrillation   . Hyperlipidemia   . Hypertension   . Hypothyroidism     after Rx for thyroid cancer  . Osteoarthritis   . Sleep disturbance   . Urinary incontinence     usually from UTI's  . Mitral regurgitation   . Depression   . Pacemaker   . GERD (gastroesophageal reflux disease)     Past Surgical History  Procedure Laterality Date  . Tonsillectomy and adenoidectomy  1937  . Vesicovaginal fistula closure w/ tah  1972  . Goiter removed  1976    cancerous. Then got RAI  . Breast lumpectomy  1983  . Breast biopsy  2002  . Pacemaker insertion  5/07    after syncope  . Cataract extraction, bilateral      Current Outpatient Prescriptions  Medication Sig Dispense Refill  . acetaminophen (TYLENOL) 500 MG tablet Take 500 mg by mouth as needed.        . hydrochlorothiazide (HYDRODIURIL) 25 MG tablet Take 25 mg by mouth daily.      Marland Kitchen levothyroxine (SYNTHROID, LEVOTHROID) 50 MCG tablet Take 3mcg on Tuesday, Thursday, Saturday and Sunday, 75 mcg on Monday, Wednesday and Friday      . lisinopril (PRINIVIL,ZESTRIL) 40 MG tablet Take 20 mg by mouth daily.       .  mirtazapine (REMERON) 15 MG tablet Take 1 tablet by mouth at  bedtime  90 tablet  3  . nitroGLYCERIN (NITROSTAT) 0.4 MG SL tablet Place 0.4 mg under the tongue every 5 (five) minutes as needed.        Marland Kitchen omeprazole (PRILOSEC) 20 MG capsule Take 1 capsule by mouth  daily  90 capsule  3  . sotalol (BETAPACE) 120 MG tablet Take 1 tablet (120 mg total) by mouth daily. Take in am  30 tablet  3  . warfarin (COUMADIN) 4 MG tablet Take one to two daily as directed.  180 tablet  3   No current facility-administered medications for this visit.    Allergies  Allergen Reactions  . Amoxicillin Diarrhea    Weakness and atrial fib  . Augmentin [Amoxicillin-Pot Clavulanate] Diarrhea    Weakness and atrial fib  . Sulfonamide Derivatives     Review of Systems negative except from HPI and PMH  Physical Exam BP 140/84  Pulse 67  Ht 5\' 2"  (1.575 m)  Wt 130 lb 4 oz (59.081 kg)  BMI 23.82 kg/m2 Well developed and well nourished in no acute distress HENT normal E scleral and icterus clear Neck Supple JVP flat; carotids brisk and full Clear to ausculation  Regular rate and rhythm, 2/6 murmur Soft with active bowel sounds No clubbing cyanosis  Trace Edema Alert and oriented, grossly normal motor and sensory function Skin Warm and Dry    Assessment and  Plan

## 2013-03-22 NOTE — Assessment & Plan Note (Signed)
Not sleeping well at all.

## 2013-03-22 NOTE — Assessment & Plan Note (Signed)
The patient's device was interrogated.  The information was reviewed. No changes were made in the programming.    

## 2013-04-17 ENCOUNTER — Other Ambulatory Visit: Payer: Self-pay | Admitting: Internal Medicine

## 2013-04-30 ENCOUNTER — Ambulatory Visit (INDEPENDENT_AMBULATORY_CARE_PROVIDER_SITE_OTHER): Payer: Medicare Other | Admitting: Family Medicine

## 2013-04-30 DIAGNOSIS — I4891 Unspecified atrial fibrillation: Secondary | ICD-10-CM

## 2013-04-30 DIAGNOSIS — Z7901 Long term (current) use of anticoagulants: Secondary | ICD-10-CM | POA: Diagnosis not present

## 2013-04-30 DIAGNOSIS — Z5181 Encounter for therapeutic drug level monitoring: Secondary | ICD-10-CM

## 2013-04-30 LAB — POCT INR: INR: 1.9

## 2013-05-03 ENCOUNTER — Ambulatory Visit: Payer: Medicare Other

## 2013-05-15 DIAGNOSIS — H6120 Impacted cerumen, unspecified ear: Secondary | ICD-10-CM | POA: Diagnosis not present

## 2013-05-15 DIAGNOSIS — H903 Sensorineural hearing loss, bilateral: Secondary | ICD-10-CM | POA: Diagnosis not present

## 2013-05-29 ENCOUNTER — Other Ambulatory Visit: Payer: Self-pay

## 2013-05-29 ENCOUNTER — Encounter: Payer: Self-pay | Admitting: Cardiovascular Disease

## 2013-05-29 ENCOUNTER — Other Ambulatory Visit (INDEPENDENT_AMBULATORY_CARE_PROVIDER_SITE_OTHER): Payer: Medicare Other

## 2013-05-29 ENCOUNTER — Ambulatory Visit (INDEPENDENT_AMBULATORY_CARE_PROVIDER_SITE_OTHER): Payer: Medicare Other | Admitting: Cardiovascular Disease

## 2013-05-29 VITALS — BP 122/70 | HR 62 | Ht 62.0 in | Wt 127.5 lb

## 2013-05-29 DIAGNOSIS — R0602 Shortness of breath: Secondary | ICD-10-CM

## 2013-05-29 DIAGNOSIS — E785 Hyperlipidemia, unspecified: Secondary | ICD-10-CM | POA: Diagnosis not present

## 2013-05-29 DIAGNOSIS — I495 Sick sinus syndrome: Secondary | ICD-10-CM

## 2013-05-29 DIAGNOSIS — I10 Essential (primary) hypertension: Secondary | ICD-10-CM

## 2013-05-29 DIAGNOSIS — I5032 Chronic diastolic (congestive) heart failure: Secondary | ICD-10-CM | POA: Insufficient documentation

## 2013-05-29 DIAGNOSIS — I4891 Unspecified atrial fibrillation: Secondary | ICD-10-CM

## 2013-05-29 DIAGNOSIS — I509 Heart failure, unspecified: Secondary | ICD-10-CM

## 2013-05-29 DIAGNOSIS — I059 Rheumatic mitral valve disease, unspecified: Secondary | ICD-10-CM

## 2013-05-29 DIAGNOSIS — Z950 Presence of cardiac pacemaker: Secondary | ICD-10-CM

## 2013-05-29 NOTE — Assessment & Plan Note (Signed)
Followed by Dr. Klein 

## 2013-05-29 NOTE — Assessment & Plan Note (Signed)
No recent lab work available. Prior cholesterol numbers were very elevated. We will discuss this with her and her daughter on her next visit

## 2013-05-29 NOTE — Progress Notes (Signed)
Patient ID: Amanda [name], female    DOB: [birth year], 80 y.o.   MRN: [id number]  HPI Comments: Amanda [name] is a pleasant 80 year old woman who  has a history of paroxysmal atrial fibrillation, normal systolic function by echocardiogram in 2007 with  MR, mild aortic valve regurgitation, mildly dilated left atrium, Holter monitor and April 2007 showing frequent and complex supraventricular arrhythmia, cardiac CTA showing no significant coronary artery disease with atherosclerotic plaque in the thoracic aorta who has been managed on sotalol for rhythm control, pacer placed in May 2007 with a history of "fainting "prior to that who presents for routine followup.  Overall, she states that she has been doing well.  She had an echocardiogram done today for periods of shortness of breath.  echo showed mild LVH, diastolic relaxation abnormality, normal ejection fraction, mild to moderate MR, high normal right ventricular systolic pressures  Daughter presents with her today and reports that overall she is doing well. Her balance is poor, high fall risk, significant gait instability. Okay if she holds onto a scooter in the shopping store, has to hold onto person's arm when walking outside the house. Daughter is concerned as she does not want to use a cane. She is willing to use a walker but does not have one.  She is relatively sedentary, does walk  during the daytime, with help. She lives at [facility] [place]. She has had several falls this year and last year.   Cardiac catheterization in April 2007 showing no significant coronary artery disease.  EKG shows normal sinus rhythm with rate 62 beats per minute with nonspecific ST abnormality   Outpatient Encounter Prescriptions as of 05/29/2013  Medication Sig  . acetaminophen (TYLENOL) 500 MG tablet Take 500 mg by mouth as needed.    . hydrochlorothiazide (HYDRODIURIL) 25 MG tablet Take 25 mg by mouth daily.  Marland Kitchen levothyroxine (SYNTHROID, LEVOTHROID) 50 MCG tablet Take  100mcg on Tuesday, Thursday, Saturday and Sunday, 75 mcg on Monday, Wednesday and Friday  . lisinopril (PRINIVIL,ZESTRIL) 40 MG tablet Take 20 mg by mouth daily.   . mirtazapine (REMERON) 15 MG tablet Take 1 tablet by mouth at   bedtime  . nitroGLYCERIN (NITROSTAT) 0.4 MG SL tablet Place 0.4 mg under the tongue every 5 (five) minutes as needed.    Marland Kitchen omeprazole (PRILOSEC) 20 MG capsule Take 1 capsule by mouth   daily  . sotalol (BETAPACE) 120 MG tablet Take 1 tablet (120 mg total) by mouth daily. Take in am  . warfarin (COUMADIN) 4 MG tablet Take one to two daily as directed.     Review of Systems  Constitutional: Negative.   HENT: Negative.   Eyes: Negative.   Respiratory: Negative.   Cardiovascular: Positive for palpitations.  Gastrointestinal: Negative.   Musculoskeletal: Positive for gait problem.  Skin: Negative.   Neurological: Negative.   Psychiatric/Behavioral: Negative.   All other systems reviewed and are negative.    BP 122/70  Pulse 62  Ht 5\' 2"  (1.575 m)  Wt 127 lb 8 oz (57.834 kg)  BMI 23.31 kg/m2\  Physical Exam  Nursing note and vitals reviewed. Constitutional: She is oriented to person, place, and time. She appears well-developed and well-nourished.  HENT:  Head: Normocephalic.  Nose: Nose normal.  Mouth/Throat: Oropharynx is clear and moist.  Eyes: Conjunctivae are normal. Pupils are equal, round, and reactive to light.  Neck: Normal range of motion. Neck supple. No JVD present.  Cardiovascular: Normal rate, regular rhythm, S1 normal, S2 normal, normal  heart sounds and intact distal pulses.  Exam reveals no gallop and no friction rub.   No murmur heard. Pulmonary/Chest: Effort normal and breath sounds normal. No respiratory distress. She has no wheezes. She has no rales. She exhibits no tenderness.  Abdominal: Soft. Bowel sounds are normal. She exhibits no distension. There is no tenderness.  Musculoskeletal: Normal range of motion. She exhibits no edema  and no tenderness.  Lymphadenopathy:    She has no cervical adenopathy.  Neurological: She is alert and oriented to person, place, and time. Coordination normal.  Skin: Skin is warm and dry. No rash noted. No erythema.  Psychiatric: She has a normal mood and affect. Her behavior is normal. Judgment and thought content normal.    Assessment and Plan

## 2013-05-29 NOTE — Assessment & Plan Note (Signed)
Mild to moderate mitral valve regurgitation on echocardiogram today

## 2013-05-29 NOTE — Assessment & Plan Note (Signed)
She is maintaining normal sinus rhythm. Tolerating anticoagulation and sotalol

## 2013-05-29 NOTE — Assessment & Plan Note (Signed)
Blood pressure is well controlled on today's visit. No changes made to the medications. 

## 2013-05-29 NOTE — Assessment & Plan Note (Addendum)
Suggested she continue on her HCTZ. Also recommended that she call our office if she has worsening leg edema or shortness of breath. We will prescribe Lasix to take when necessary. It would appear that she does not drink much fluids at baseline

## 2013-05-29 NOTE — Patient Instructions (Signed)
You are doing well. No medication changes were made.  Please call us if you have new issues that need to be addressed before your next appt.  Your physician wants you to follow-up in: 6 months.  You will receive a reminder letter in the mail two months in advance. If you don't receive a letter, please call our office to schedule the follow-up appointment.   

## 2013-05-30 DIAGNOSIS — L578 Other skin changes due to chronic exposure to nonionizing radiation: Secondary | ICD-10-CM | POA: Diagnosis not present

## 2013-05-30 DIAGNOSIS — L908 Other atrophic disorders of skin: Secondary | ICD-10-CM | POA: Diagnosis not present

## 2013-05-30 DIAGNOSIS — L819 Disorder of pigmentation, unspecified: Secondary | ICD-10-CM | POA: Diagnosis not present

## 2013-05-30 DIAGNOSIS — L570 Actinic keratosis: Secondary | ICD-10-CM | POA: Diagnosis not present

## 2013-06-06 IMAGING — CR DG CHEST 2V
1 series · 2 of 2 positions shown · non-contrast
Comparison: none

REASON FOR EXAM: chest pain
COMMENTS:

PROCEDURE:     DXR - DXR CHEST PA (OR AP) AND LATERAL  - September 13, 2011  [DATE]
RESULT:     The lung fields are clear. The heart, mediastinal and osseous
structures show no acute changes. A cardiac pacemaker is present.

[Series 1: ap · 0.17mm/px · 2 of 2 slices shown]
[im 1/2]
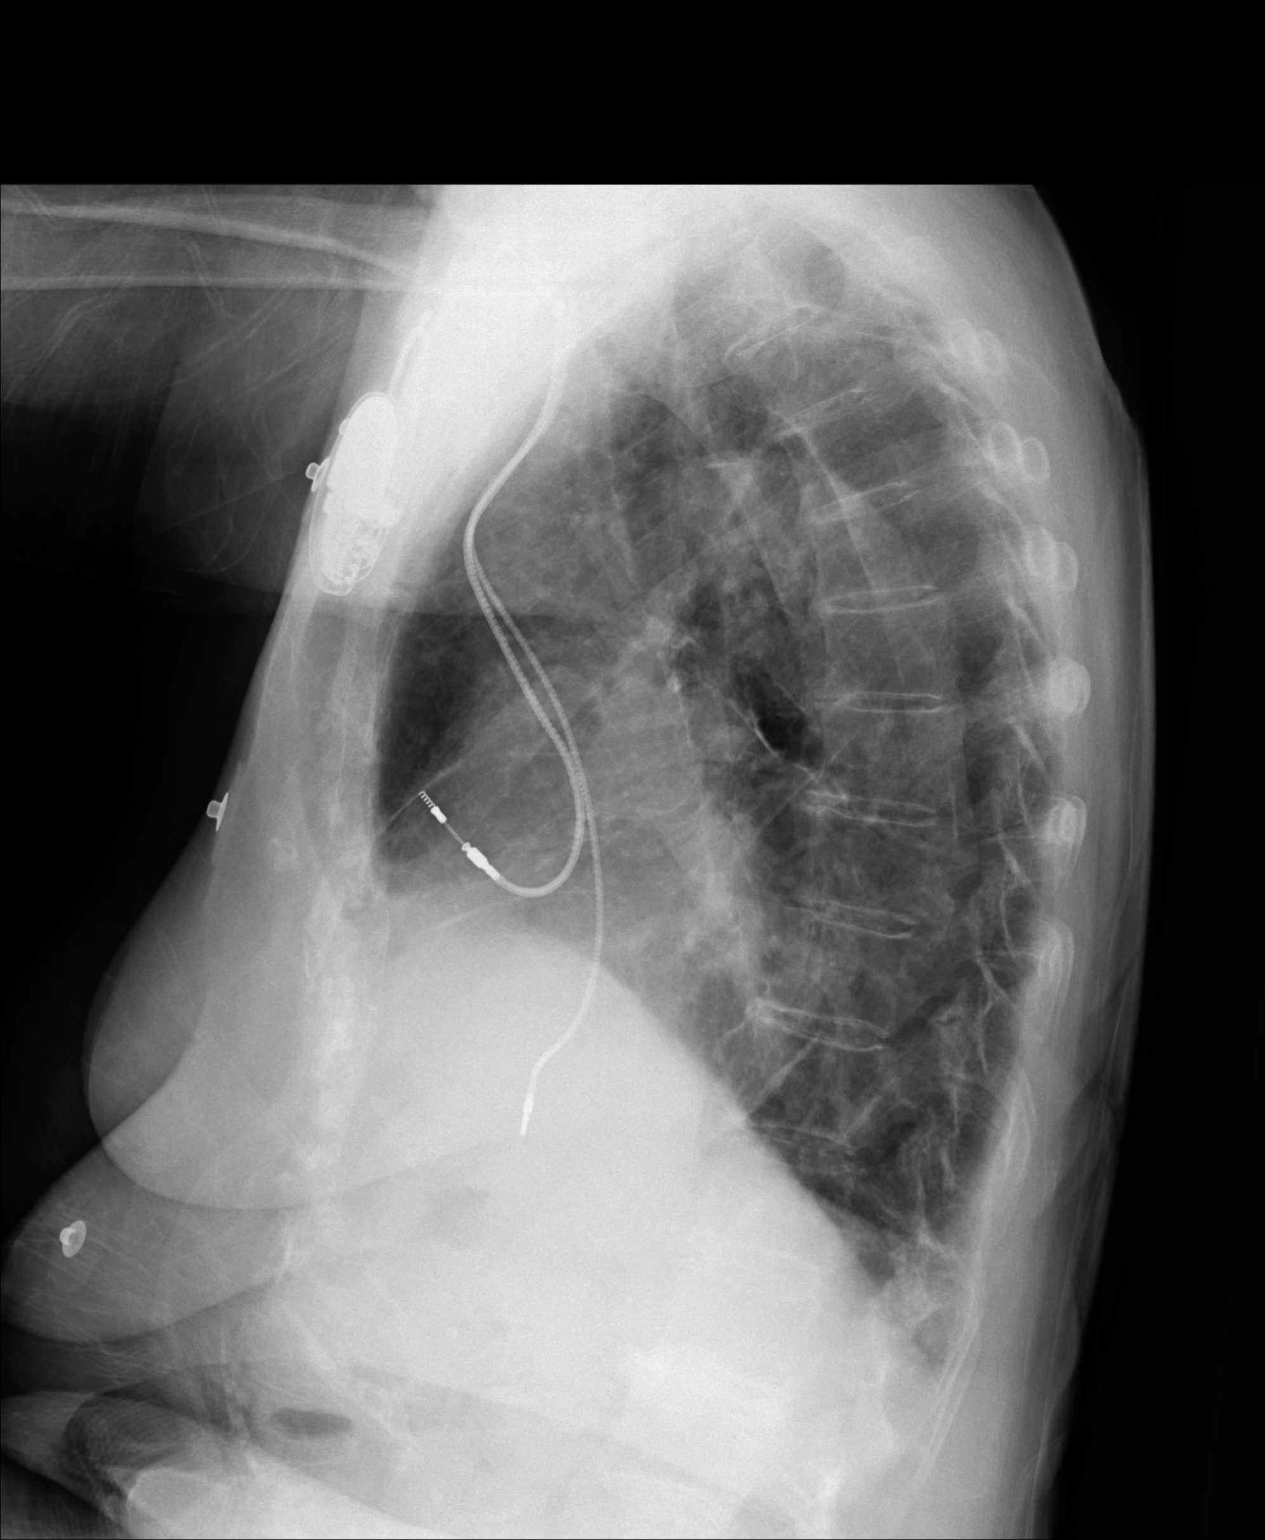
[im 2/2]
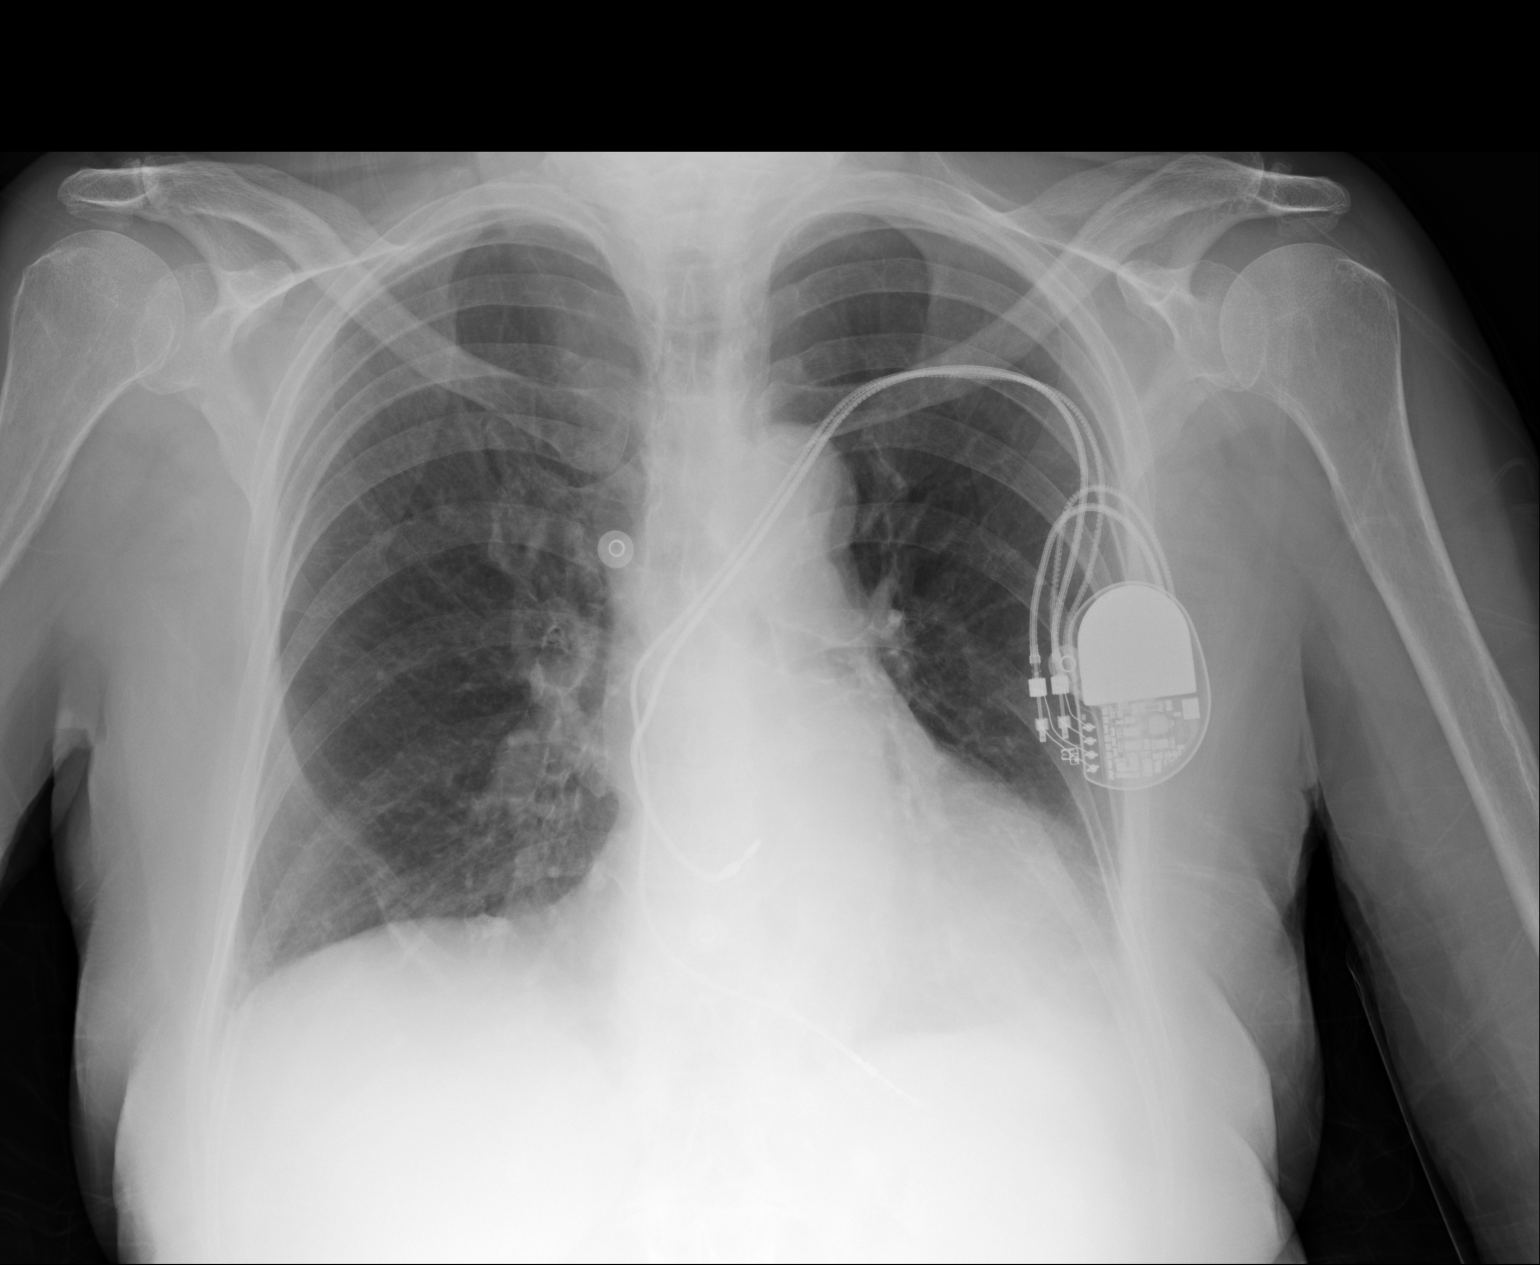

[2 of 2 positions shown; findings below may reference images not displayed]

IMPRESSION: No acute changes are identified.

[REDACTED]

## 2013-06-11 ENCOUNTER — Ambulatory Visit (INDEPENDENT_AMBULATORY_CARE_PROVIDER_SITE_OTHER): Payer: Medicare Other | Admitting: Family Medicine

## 2013-06-11 ENCOUNTER — Ambulatory Visit: Payer: Medicare Other

## 2013-06-11 DIAGNOSIS — I4891 Unspecified atrial fibrillation: Secondary | ICD-10-CM | POA: Diagnosis not present

## 2013-06-11 DIAGNOSIS — Z7901 Long term (current) use of anticoagulants: Secondary | ICD-10-CM

## 2013-06-11 DIAGNOSIS — Z5181 Encounter for therapeutic drug level monitoring: Secondary | ICD-10-CM

## 2013-06-11 LAB — POCT INR: INR: 2.6

## 2013-06-22 ENCOUNTER — Other Ambulatory Visit: Payer: Self-pay

## 2013-06-22 MED ORDER — LEVOTHYROXINE SODIUM 50 MCG PO TABS: ORAL_TABLET | ORAL | Status: DC

## 2013-06-22 NOTE — Telephone Encounter (Signed)
Patient is aware that we do not refill levothyroxine. Pt will contact pcp for refill.

## 2013-06-22 NOTE — Telephone Encounter (Signed)
Pt states she is completely out. Pt would like samples

## 2013-06-22 NOTE — Telephone Encounter (Signed)
Pt [facility] because pt is out of med. Pt has CPX 07/16/13.Advised [name] refill done.

## 2013-06-25 ENCOUNTER — Ambulatory Visit (INDEPENDENT_AMBULATORY_CARE_PROVIDER_SITE_OTHER): Payer: Medicare Other | Admitting: *Deleted

## 2013-06-25 ENCOUNTER — Encounter: Payer: Self-pay | Admitting: Internal Medicine

## 2013-06-25 DIAGNOSIS — Z950 Presence of cardiac pacemaker: Secondary | ICD-10-CM | POA: Diagnosis not present

## 2013-06-25 DIAGNOSIS — I495 Sick sinus syndrome: Secondary | ICD-10-CM | POA: Diagnosis not present

## 2013-06-30 LAB — MDC_IDC_ENUM_SESS_TYPE_REMOTE
Battery Impedance: 2163 Ohm
Battery Remaining Longevity: 24 mo
Brady Statistic AP VP Percent: 2 %
Brady Statistic AP VS Percent: 94 %
Brady Statistic AS VP Percent: 0 %
Brady Statistic AS VS Percent: 4 %
Date Time Interrogation Session: 20150309190220
Lead Channel Impedance Value: 463 Ohm
Lead Channel Impedance Value: 720 Ohm
Lead Channel Pacing Threshold Amplitude: 0.875 V
Lead Channel Pacing Threshold Amplitude: 0.875 V
Lead Channel Pacing Threshold Pulse Width: 0.4 ms
Lead Channel Sensing Intrinsic Amplitude: 5.6 mV
Lead Channel Setting Pacing Amplitude: 2 V
MDC IDC MSMT BATTERY VOLTAGE: 2.72 V
MDC IDC MSMT LEADCHNL RV PACING THRESHOLD PULSEWIDTH: 0.4 ms
MDC IDC SET LEADCHNL RV PACING AMPLITUDE: 2.5 V
MDC IDC SET LEADCHNL RV PACING PULSEWIDTH: 0.4 ms
MDC IDC SET LEADCHNL RV SENSING SENSITIVITY: 2 mV

## 2013-07-16 ENCOUNTER — Encounter: Payer: Self-pay | Admitting: Internal Medicine

## 2013-07-16 ENCOUNTER — Ambulatory Visit (INDEPENDENT_AMBULATORY_CARE_PROVIDER_SITE_OTHER): Payer: Medicare Other | Admitting: Internal Medicine

## 2013-07-16 VITALS — BP 118/60 | HR 62 | Temp 98.9°F | Ht 62.0 in | Wt 127.0 lb

## 2013-07-16 DIAGNOSIS — Z23 Encounter for immunization: Secondary | ICD-10-CM

## 2013-07-16 DIAGNOSIS — I5032 Chronic diastolic (congestive) heart failure: Secondary | ICD-10-CM

## 2013-07-16 DIAGNOSIS — I700 Atherosclerosis of aorta: Secondary | ICD-10-CM | POA: Insufficient documentation

## 2013-07-16 DIAGNOSIS — Z0000 Encounter for general adult medical examination without abnormal findings: Secondary | ICD-10-CM

## 2013-07-16 DIAGNOSIS — F39 Unspecified mood [affective] disorder: Secondary | ICD-10-CM | POA: Diagnosis not present

## 2013-07-16 DIAGNOSIS — N183 Chronic kidney disease, stage 3 unspecified: Secondary | ICD-10-CM

## 2013-07-16 DIAGNOSIS — E039 Hypothyroidism, unspecified: Secondary | ICD-10-CM | POA: Diagnosis not present

## 2013-07-16 DIAGNOSIS — I4891 Unspecified atrial fibrillation: Secondary | ICD-10-CM

## 2013-07-16 DIAGNOSIS — I10 Essential (primary) hypertension: Secondary | ICD-10-CM

## 2013-07-16 DIAGNOSIS — I509 Heart failure, unspecified: Secondary | ICD-10-CM

## 2013-07-16 MED ORDER — HYDROCHLOROTHIAZIDE 25 MG PO TABS: 25.0000 mg | ORAL_TABLET | Freq: Every day | ORAL | Status: DC

## 2013-07-16 MED ORDER — LISINOPRIL 20 MG PO TABS: 20.0000 mg | ORAL_TABLET | Freq: Every day | ORAL | Status: DC

## 2013-07-16 NOTE — Assessment & Plan Note (Signed)
No major depression Will continue the mirtazapine

## 2013-07-16 NOTE — Assessment & Plan Note (Signed)
BP Readings from Last 3 Encounters:  07/16/13 118/60  05/29/13 122/70  03/22/13 140/84   Good control on heart meds

## 2013-07-16 NOTE — Assessment & Plan Note (Signed)
BP good On coumadin

## 2013-07-16 NOTE — Progress Notes (Signed)
Subjective:    Patient ID: Amanda [name], female    DOB: Sep 22, 1919, 80 y.o.   MRN: [id number]  HPI Here with daughter Amanda [name] wellness and follow up Reviewed form No alcohol or tobacco Had 1 or 2 falls-minor but with mild injury Plans to get walker---did discuss working with Amanda [name] the fitness [employer] Reviewed other doctors she sees Vision and hearing okay. Uses hearing aides Daughter does bills---by her choice Doesn't drive Does other instrumental ADLs Mild memory problems--nothing major  No palpitations No chest pain No SOB Some edema--mild. Better in AM Uses 2 pillows-- no PND  Mood has been okay Some melancholy at times  No significant change in skin or hair Does have dry skin now--?related to the heat  Current Outpatient Prescriptions on File Prior to Visit  Medication Sig Dispense Refill  . acetaminophen (TYLENOL) 500 MG tablet Take 500 mg by mouth as needed.        Marland Kitchen levothyroxine (SYNTHROID, LEVOTHROID) 50 MCG tablet Take 87mcg on Tuesday, Thursday, Saturday and Sunday, 75 mcg on Monday, Wednesday and Friday  20 tablet  0  . mirtazapine (REMERON) 15 MG tablet Take 1 tablet by mouth at   bedtime  90 tablet  3  . nitroGLYCERIN (NITROSTAT) 0.4 MG SL tablet Place 0.4 mg under the tongue every 5 (five) minutes as needed.        Marland Kitchen omeprazole (PRILOSEC) 20 MG capsule Take 1 capsule by mouth   daily  90 capsule  3  . sotalol (BETAPACE) 120 MG tablet Take 1 tablet (120 mg total) by mouth daily. Take in am  30 tablet  3  . warfarin (COUMADIN) 4 MG tablet Take one to two daily as directed.  180 tablet  3   No current facility-administered medications on file prior to visit.    Allergies  Allergen Reactions  . Amoxicillin Diarrhea    Weakness and atrial fib  . Augmentin [Amoxicillin-Pot Clavulanate] Diarrhea    Weakness and atrial fib  . Sulfonamide Derivatives     Past Medical History  Diagnosis Date  . Atrial fibrillation   . Hyperlipidemia   .  Hypertension   . Hypothyroidism     after Rx for thyroid cancer  . Osteoarthritis   . Sleep disturbance   . Urinary incontinence     usually from UTI's  . Mitral regurgitation   . Depression   . Pacemaker   . GERD (gastroesophageal reflux disease)     Past Surgical History  Procedure Laterality Date  . Tonsillectomy and adenoidectomy  1937  . Vesicovaginal fistula closure w/ tah  1972  . Goiter removed  1976    cancerous. Then got RAI  . Breast lumpectomy  1983  . Breast biopsy  2002  . Pacemaker insertion  5/07    after syncope  . Cataract extraction, bilateral      Family History  Problem Relation Age of Onset  . Diabetes Mother   . Stroke Sister   . Coronary artery disease Brother   . Prostate cancer Brother   . Breast cancer Neg Hx   . Colon cancer Neg Hx     History   Social History  . Marital Status: Widowed    Spouse Name: N/A    Number of Children: 2  . Years of Education: N/A   Occupational History  . homemaker    Social History Main Topics  . Smoking status: Never Smoker   . Smokeless tobacco: Never Used  .  Alcohol Use: No  . Drug Use: No  . Sexual Activity: Not on file   Other Topics Concern  . Not on file   Social History Narrative   Widowed 2002. 1 local daughter. Other in [place]. Was homemaker. Farmed and gardened. 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    Daughter Amanda [name]) is health care POA.    Discussed DNR and she requests    Would not want tube feeds if cognitively unaware   Review of Systems Appetite is good Weight is stable Bowels are okay---may be loose once a week or so Voids okay. Some urgency. No significant incontinence. Wants place on neck checked--- seems about the same    Objective:   Physical Exam  Constitutional: She is oriented to person, place, and time. She appears well-developed and well-nourished. No distress.  HENT:  Mouth/Throat: Oropharynx is clear and moist. No oropharyngeal exudate.  Full dentures  Neck: Normal  range of motion. Neck supple. No thyromegaly present.  Cardiovascular: Normal rate, regular rhythm and normal heart sounds.  Exam reveals no gallop.   No murmur heard. Pulmonary/Chest: Effort normal and breath sounds normal. No respiratory distress. She has no wheezes. She has no rales.  Abdominal: Soft. There is no tenderness.  Musculoskeletal: She exhibits no edema and no tenderness.  Lymphadenopathy:    She has no cervical adenopathy.  Neurological: She is alert and oriented to person, place, and time.  President-- "Obama, Bush, Clinton" [phone] [id number] Recall  2/3  Psychiatric: She has a normal mood and affect. Her behavior is normal.          Assessment & Plan:

## 2013-07-16 NOTE — Assessment & Plan Note (Signed)
Seems euthyroid

## 2013-07-16 NOTE — Assessment & Plan Note (Signed)
Paced Is on coumadin

## 2013-07-16 NOTE — Assessment & Plan Note (Signed)
Due for labs

## 2013-07-16 NOTE — Assessment & Plan Note (Signed)
I have personally reviewed the Medicare Annual Wellness questionnaire and have noted 1. The patient's medical and social history 2. Their use of alcohol, tobacco or illicit drugs 3. Their current medications and supplements 4. The patient's functional ability including ADL's, fall risks, home safety risks and hearing or visual             impairment. 5. Diet and physical activities 6. Evidence for depression or mood disorders  The patients weight, height, BMI and visual acuity have been recorded in the chart I have made referrals, counseling and provided education to the patient based review of the above and I have provided the pt with a written personalized care plan for preventive services.  I have provided you with a copy of your personalized plan for preventive services. Please take the time to review along with your updated medication list.  Will give prevnar Needs rollator walker for safety

## 2013-07-16 NOTE — Addendum Note (Signed)
Addended by: Despina Hidden on: 07/16/2013 05:49 PM   Modules accepted: Orders

## 2013-07-16 NOTE — Progress Notes (Signed)
Pre visit review using our clinic review tool, if applicable. No additional management support is needed unless otherwise documented below in the visit note. 

## 2013-07-16 NOTE — Assessment & Plan Note (Signed)
Compensated. No changes needed. 

## 2013-07-17 ENCOUNTER — Encounter: Payer: Self-pay | Admitting: *Deleted

## 2013-07-17 LAB — T4, FREE: FREE T4: 1.16 ng/dL (ref 0.60–1.60)

## 2013-07-17 LAB — CBC WITH DIFFERENTIAL/PLATELET
BASOS ABS: 0.1 10*3/uL (ref 0.0–0.1)
BASOS PCT: 0.8 % (ref 0.0–3.0)
EOS ABS: 0.7 10*3/uL (ref 0.0–0.7)
Eosinophils Relative: 9.6 % — ABNORMAL HIGH (ref 0.0–5.0)
HCT: 32.4 % — ABNORMAL LOW (ref 36.0–46.0)
Hemoglobin: 10.8 g/dL — ABNORMAL LOW (ref 12.0–15.0)
LYMPHS PCT: 21.5 % (ref 12.0–46.0)
Lymphs Abs: 1.5 10*3/uL (ref 0.7–4.0)
MCHC: 33.4 g/dL (ref 30.0–36.0)
MCV: 83.1 fl (ref 78.0–100.0)
Monocytes Absolute: 0.7 10*3/uL (ref 0.1–1.0)
Monocytes Relative: 9.7 % (ref 3.0–12.0)
NEUTROS PCT: 58.4 % (ref 43.0–77.0)
Neutro Abs: 4.1 10*3/uL (ref 1.4–7.7)
PLATELETS: 242 10*3/uL (ref 150.0–400.0)
RBC: 3.9 Mil/uL (ref 3.87–5.11)
RDW: 15.5 % — AB (ref 11.5–14.6)
WBC: 7 10*3/uL (ref 4.5–10.5)

## 2013-07-17 LAB — COMPREHENSIVE METABOLIC PANEL
ALBUMIN: 3.9 g/dL (ref 3.5–5.2)
ALT: 12 U/L (ref 0–35)
AST: 21 U/L (ref 0–37)
Alkaline Phosphatase: 68 U/L (ref 39–117)
BUN: 36 mg/dL — AB (ref 6–23)
CALCIUM: 9.5 mg/dL (ref 8.4–10.5)
CHLORIDE: 100 meq/L (ref 96–112)
CO2: 30 mEq/L (ref 19–32)
Creatinine, Ser: 1.4 mg/dL — ABNORMAL HIGH (ref 0.4–1.2)
GFR: 39.04 mL/min — ABNORMAL LOW (ref >60.00)
Glucose, Bld: 104 mg/dL — ABNORMAL HIGH (ref 70–99)
POTASSIUM: 3.5 meq/L (ref 3.5–5.1)
SODIUM: 140 meq/L (ref 135–145)
TOTAL PROTEIN: 7.7 g/dL (ref 6.0–8.3)
Total Bilirubin: 0.7 mg/dL (ref 0.3–1.2)

## 2013-07-17 LAB — TSH: TSH: 0.87 u[IU]/mL (ref 0.35–5.50)

## 2013-07-18 ENCOUNTER — Encounter: Payer: Self-pay | Admitting: *Deleted

## 2013-07-26 ENCOUNTER — Ambulatory Visit (INDEPENDENT_AMBULATORY_CARE_PROVIDER_SITE_OTHER): Payer: Medicare Other | Admitting: Family Medicine

## 2013-07-26 DIAGNOSIS — Z7901 Long term (current) use of anticoagulants: Secondary | ICD-10-CM

## 2013-07-26 DIAGNOSIS — I4891 Unspecified atrial fibrillation: Secondary | ICD-10-CM

## 2013-07-26 DIAGNOSIS — Z5181 Encounter for therapeutic drug level monitoring: Secondary | ICD-10-CM | POA: Diagnosis not present

## 2013-07-26 LAB — POCT INR: INR: 2.9

## 2013-07-29 ENCOUNTER — Other Ambulatory Visit: Payer: Self-pay | Admitting: Internal Medicine

## 2013-08-17 ENCOUNTER — Other Ambulatory Visit: Payer: Self-pay | Admitting: *Deleted

## 2013-08-17 MED ORDER — WARFARIN SODIUM 4 MG PO TABS: ORAL_TABLET | ORAL | Status: DC

## 2013-08-20 ENCOUNTER — Telehealth: Payer: Self-pay

## 2013-08-20 NOTE — Telephone Encounter (Signed)
Faxed CMN for walker w/ seat, as well as pt's last office note, to [employer] at [phone].

## 2013-09-03 ENCOUNTER — Encounter: Payer: Self-pay | Admitting: Internal Medicine

## 2013-09-03 ENCOUNTER — Ambulatory Visit (INDEPENDENT_AMBULATORY_CARE_PROVIDER_SITE_OTHER): Payer: Medicare Other | Admitting: Internal Medicine

## 2013-09-03 ENCOUNTER — Ambulatory Visit (INDEPENDENT_AMBULATORY_CARE_PROVIDER_SITE_OTHER): Payer: Medicare Other | Admitting: Family Medicine

## 2013-09-03 VITALS — BP 118/60 | HR 77 | Wt 128.0 lb

## 2013-09-03 DIAGNOSIS — S0083XA Contusion of other part of head, initial encounter: Secondary | ICD-10-CM

## 2013-09-03 DIAGNOSIS — Z5181 Encounter for therapeutic drug level monitoring: Secondary | ICD-10-CM

## 2013-09-03 DIAGNOSIS — S1093XA Contusion of unspecified part of neck, initial encounter: Secondary | ICD-10-CM

## 2013-09-03 DIAGNOSIS — I4891 Unspecified atrial fibrillation: Secondary | ICD-10-CM

## 2013-09-03 DIAGNOSIS — Z7901 Long term (current) use of anticoagulants: Secondary | ICD-10-CM

## 2013-09-03 DIAGNOSIS — S0003XA Contusion of scalp, initial encounter: Secondary | ICD-10-CM | POA: Diagnosis not present

## 2013-09-03 LAB — POCT INR: INR: 3.3

## 2013-09-03 NOTE — Progress Notes (Signed)
Pre visit review using our clinic review tool, if applicable. No additional management support is needed unless otherwise documented below in the visit note. 

## 2013-09-03 NOTE — Progress Notes (Signed)
Subjective:    Patient ID: Amanda [name], female    DOB: June 13, 1919, 80 y.o.   MRN: [id number]  HPI Here with daughter  Was on patio a week ago, cleaning up Not sure what happened but thinks she turned ankle and fell May have hit hip first but had knot on back of head No loss of conciousness Got up and then finished the work Then called daughter who spent the night  When daughter got there--she was on phone with other daughter and no confusion Took a shower, then mopped floors, etc  Some persistent soreness all week Couldn't put head back Still did her normal activities Read about head injuries and got nervous yesterday  No real headache--just the localized pain Only taken 2 tylenol each night  Current Outpatient Prescriptions on File Prior to Visit  Medication Sig Dispense Refill  . acetaminophen (TYLENOL) 500 MG tablet Take 500 mg by mouth as needed.        . cholecalciferol (VITAMIN D) 1000 UNITS tablet Take 1,000 Units by mouth daily.      . hydrochlorothiazide (HYDRODIURIL) 25 MG tablet Take 1 tablet (25 mg total) by mouth daily.  90 tablet  3  . levothyroxine (SYNTHROID, LEVOTHROID) 50 MCG tablet Take 75mcg on Tuesday, Thursday, Saturday and Sunday, 75 mcg on Monday, Wednesday and Friday  20 tablet  0  . lisinopril (PRINIVIL,ZESTRIL) 20 MG tablet Take 1 tablet (20 mg total) by mouth daily.  90 tablet  3  . mirtazapine (REMERON) 15 MG tablet Take 1 tablet by mouth at   bedtime  90 tablet  3  . nitroGLYCERIN (NITROSTAT) 0.4 MG SL tablet Place 0.4 mg under the tongue every 5 (five) minutes as needed.        Marland Kitchen omeprazole (PRILOSEC) 20 MG capsule Take 1 capsule by mouth   daily  90 capsule  3  . sotalol (BETAPACE) 120 MG tablet Take 1 tablet by mouth  twice a day  180 tablet  3  . warfarin (COUMADIN) 4 MG tablet Take one to two daily as directed.  90 tablet  1   No current facility-administered medications on file prior to visit.    Allergies  Allergen Reactions  .  Amoxicillin Diarrhea    Weakness and atrial fib  . Augmentin [Amoxicillin-Pot Clavulanate] Diarrhea    Weakness and atrial fib  . Sulfonamide Derivatives     Past Medical History  Diagnosis Date  . Atrial fibrillation   . Hyperlipidemia   . Hypertension   . Hypothyroidism     after Rx for thyroid cancer  . Osteoarthritis   . Sleep disturbance   . Urinary incontinence     usually from UTI's  . Mitral regurgitation   . Depression   . Pacemaker   . GERD (gastroesophageal reflux disease)     Past Surgical History  Procedure Laterality Date  . Tonsillectomy and adenoidectomy  1937  . Vesicovaginal fistula closure w/ tah  1972  . Goiter removed  1976    cancerous. Then got RAI  . Breast lumpectomy  1983  . Breast biopsy  2002  . Pacemaker insertion  5/07    after syncope  . Cataract extraction, bilateral      Family History  Problem Relation Age of Onset  . Diabetes Mother   . Stroke Sister   . Coronary artery disease Brother   . Prostate cancer Brother   . Breast cancer Neg Hx   . Colon cancer Neg Hx  History   Social History  . Marital Status: Widowed    Spouse Name: N/A    Number of Children: 2  . Years of Education: N/A   Occupational History  . homemaker    Social History Main Topics  . Smoking status: Never Smoker   . Smokeless tobacco: Never Used  . Alcohol Use: No  . Drug Use: No  . Sexual Activity: Not on file   Other Topics Concern  . Not on file   Social History Narrative   Widowed 2002. 1 local daughter. 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    Daughter [name]) is health care POA.    Discussed DNR and she requests    Would not want tube feeds if cognitively unaware   Review of Systems No nausea or vomiting Appetite is fine No vision change No facial weakness, focal weakness, speech change    Objective:   Physical Exam  Constitutional: She appears well-developed and well-nourished. No  distress.  HENT:  ~3.5cm raised hematoma on parietal scalp near midline No major tenderness Not inflamed  Eyes: Conjunctivae and EOM are normal. Pupils are equal, round, and reactive to light.  Left exotropia--not new  Neck: Normal range of motion. Neck supple. No thyromegaly present.  Slight stiffness in passive ROM but fairly full  Lymphadenopathy:    She has no cervical adenopathy.  Neurological: She is alert. She has normal strength. No cranial nerve deficit. She exhibits normal muscle tone. She displays a negative Romberg sign. Coordination and gait normal.          Assessment & Plan:

## 2013-09-03 NOTE — Assessment & Plan Note (Signed)
No evidence of intracranial injury Mild tenderness but no secondary infection Reassured  Discussed supportive care--too late for ice but can use tylenol No imaging needed

## 2013-09-26 ENCOUNTER — Encounter: Payer: Self-pay | Admitting: Internal Medicine

## 2013-09-26 ENCOUNTER — Ambulatory Visit (INDEPENDENT_AMBULATORY_CARE_PROVIDER_SITE_OTHER): Payer: Medicare Other | Admitting: *Deleted

## 2013-09-26 DIAGNOSIS — I4891 Unspecified atrial fibrillation: Secondary | ICD-10-CM | POA: Diagnosis not present

## 2013-09-26 DIAGNOSIS — I495 Sick sinus syndrome: Secondary | ICD-10-CM | POA: Diagnosis not present

## 2013-09-26 LAB — MDC_IDC_ENUM_SESS_TYPE_REMOTE
Battery Impedance: 2309 Ohm
Battery Voltage: 2.71 V
Brady Statistic AP VS Percent: 94 %
Brady Statistic AS VP Percent: 0 %
Brady Statistic AS VS Percent: 4 %
Date Time Interrogation Session: 20150610134840
Lead Channel Impedance Value: 454 Ohm
Lead Channel Pacing Threshold Amplitude: 0.75 V
Lead Channel Pacing Threshold Amplitude: 1 V
Lead Channel Pacing Threshold Pulse Width: 0.4 ms
Lead Channel Setting Pacing Amplitude: 2.5 V
Lead Channel Setting Pacing Pulse Width: 0.4 ms
Lead Channel Setting Sensing Sensitivity: 2 mV
MDC IDC MSMT BATTERY REMAINING LONGEVITY: 22 mo
MDC IDC MSMT LEADCHNL RA IMPEDANCE VALUE: 761 Ohm
MDC IDC MSMT LEADCHNL RA PACING THRESHOLD PULSEWIDTH: 0.4 ms
MDC IDC MSMT LEADCHNL RV SENSING INTR AMPL: 5.6 mV
MDC IDC SET LEADCHNL RA PACING AMPLITUDE: 2 V
MDC IDC STAT BRADY AP VP PERCENT: 1 %

## 2013-09-26 NOTE — Progress Notes (Signed)
Remote pacemaker transmission.   

## 2013-10-03 ENCOUNTER — Other Ambulatory Visit: Payer: Self-pay | Admitting: Internal Medicine

## 2013-10-11 ENCOUNTER — Ambulatory Visit (INDEPENDENT_AMBULATORY_CARE_PROVIDER_SITE_OTHER): Payer: Medicare Other | Admitting: Family Medicine

## 2013-10-11 DIAGNOSIS — Z7901 Long term (current) use of anticoagulants: Secondary | ICD-10-CM

## 2013-10-11 DIAGNOSIS — I4891 Unspecified atrial fibrillation: Secondary | ICD-10-CM | POA: Diagnosis not present

## 2013-10-11 DIAGNOSIS — Z5181 Encounter for therapeutic drug level monitoring: Secondary | ICD-10-CM

## 2013-10-11 LAB — POCT INR: INR: 3.2

## 2013-10-23 ENCOUNTER — Encounter: Payer: Self-pay | Admitting: Cardiology

## 2013-10-24 DIAGNOSIS — H5010 Unspecified exotropia: Secondary | ICD-10-CM | POA: Diagnosis not present

## 2013-11-13 DIAGNOSIS — H6120 Impacted cerumen, unspecified ear: Secondary | ICD-10-CM | POA: Diagnosis not present

## 2013-11-13 DIAGNOSIS — H903 Sensorineural hearing loss, bilateral: Secondary | ICD-10-CM | POA: Diagnosis not present

## 2013-11-22 ENCOUNTER — Ambulatory Visit (INDEPENDENT_AMBULATORY_CARE_PROVIDER_SITE_OTHER): Payer: Medicare Other | Admitting: Family Medicine

## 2013-11-22 DIAGNOSIS — Z7901 Long term (current) use of anticoagulants: Secondary | ICD-10-CM

## 2013-11-22 DIAGNOSIS — I4891 Unspecified atrial fibrillation: Secondary | ICD-10-CM

## 2013-11-22 DIAGNOSIS — Z5181 Encounter for therapeutic drug level monitoring: Secondary | ICD-10-CM

## 2013-11-22 LAB — POCT INR: INR: 2.9

## 2013-11-26 ENCOUNTER — Ambulatory Visit (INDEPENDENT_AMBULATORY_CARE_PROVIDER_SITE_OTHER): Payer: Medicare Other | Admitting: Cardiovascular Disease

## 2013-11-26 ENCOUNTER — Encounter: Payer: Self-pay | Admitting: Cardiovascular Disease

## 2013-11-26 VITALS — BP 140/72 | HR 66 | Ht 62.0 in | Wt 125.5 lb

## 2013-11-26 DIAGNOSIS — K219 Gastro-esophageal reflux disease without esophagitis: Secondary | ICD-10-CM

## 2013-11-26 DIAGNOSIS — R5381 Other malaise: Secondary | ICD-10-CM | POA: Diagnosis not present

## 2013-11-26 DIAGNOSIS — I4891 Unspecified atrial fibrillation: Secondary | ICD-10-CM | POA: Diagnosis not present

## 2013-11-26 DIAGNOSIS — E785 Hyperlipidemia, unspecified: Secondary | ICD-10-CM

## 2013-11-26 DIAGNOSIS — Z950 Presence of cardiac pacemaker: Secondary | ICD-10-CM

## 2013-11-26 DIAGNOSIS — I10 Essential (primary) hypertension: Secondary | ICD-10-CM

## 2013-11-26 DIAGNOSIS — R531 Weakness: Secondary | ICD-10-CM

## 2013-11-26 DIAGNOSIS — R5383 Other fatigue: Secondary | ICD-10-CM

## 2013-11-26 NOTE — Assessment & Plan Note (Signed)
Suggested she stay on her current medications but change the sotalol to one half dose twice a day

## 2013-11-26 NOTE — Patient Instructions (Signed)
You are doing well.  Please cut the sotolol in 1/2 twice a day (60 mg in the Am and 60 mg in the PM)  If you have an episode of atrial fibrillation,  Take an extra 1/2 dose of sotolol  For gas, Drink pepsi (try to belch) Gas x Tums, pepcid (generic) take 2  Please call us if you have new issues that need to be addressed before your next appt.  Your physician wants you to follow-up in: 6 months.  You will receive a reminder letter in the mail two months in advance. If you don't receive a letter, please call our office to schedule the follow-up appointment.

## 2013-11-26 NOTE — Assessment & Plan Note (Signed)
Pacemaker followed by Dr. Caryl Comes. She is scheduled for remote check in several weeks

## 2013-11-26 NOTE — Assessment & Plan Note (Addendum)
Recent episode of atrial fibrillation. Details discussed with her at length. She reports that they typically presents after a gas pain episode, typically in the morning. She takes sotalol 120 mg only once a day in the morning. Previous note from Dr. Caryl Comes showed that she was only taking this once a day. We have suggested that she cut the sotalol in half and take 60 mg twice a day in an effort to decrease her arrhythmia burden at nighttime and in the morning.

## 2013-11-26 NOTE — Assessment & Plan Note (Signed)
She continues to have episodes of gas pain. Sometimes this precipitates her atrial fibrillation episodes. Recommended she take TUMS, Pepcid x2, also try carbonation. If symptoms continue to be frequent, perhaps she could take omeprazole twice a day

## 2013-11-26 NOTE — Assessment & Plan Note (Signed)
History of elevated cholesterol. Currently not on a statin

## 2013-11-26 NOTE — Progress Notes (Signed)
Patient ID: Amanda [name], female    DOB: 10/10/1919, 80 y.o.   MRN: [id number]  HPI Comments: Amanda [name] is a pleasant 80 year old woman who  has a history of paroxysmal atrial fibrillation, normal systolic function by echocardiogram in 2007 with  MR, mild aortic valve regurgitation, mildly dilated left atrium, Holter monitor and April 2007 showing frequent and complex supraventricular arrhythmia, cardiac CTA showing no significant coronary artery disease with atherosclerotic plaque in the thoracic aorta who has been managed on sotalol for rhythm control, pacer placed in May 2007 with a history of "fainting "prior to that who presents for routine followup.  Overall, she states that she has been doing well.  She reports having an episode of atrial fibrillation this past weekend lasting 24 hours. She woke up with tachycardia. She felt weak, later in the house until symptoms resolved. She feels that it started after she had some GI gas pain. She has had periodically. She takes Prilosec once a day Prior pacer check in December 2014 showing atrial fibrillation burden 0.2%  Previous echocardiogram for shortness of breath  echo showed mild LVH, diastolic relaxation abnormality, normal ejection fraction, mild to moderate MR, high normal right ventricular systolic pressures  Her balance is poor, high fall risk, significant gait instability. Okay if she holds onto a scooter in the shopping store, has to hold onto person's arm when walking outside the house. She is relatively sedentary, does walk  during the daytime, with help. She lives at [facility]. She has had several previous falls  Cardiac catheterization in April 2007 showing no significant coronary artery disease.  EKG shows normal sinus rhythm with rate 66 beats per minute with nonspecific ST abnormality   Outpatient Encounter Prescriptions as of 11/26/2013  Medication Sig  . acetaminophen (TYLENOL) 500 MG tablet Take 500 mg by mouth as needed.     . cholecalciferol (VITAMIN D) 1000 UNITS tablet Take 1,000 Units by mouth daily.  . hydrochlorothiazide (HYDRODIURIL) 25 MG tablet Take 1 tablet (25 mg total) by mouth daily.  Marland Kitchen levothyroxine (SYNTHROID, LEVOTHROID) 50 MCG tablet Take 45mcg on Tuesday,  Thursday, Saturday and  Sunday, 75 mcg on Monday,  Wednesday and Friday  . lisinopril (PRINIVIL,ZESTRIL) 20 MG tablet Take 1 tablet (20 mg total) by mouth daily.  . mirtazapine (REMERON) 15 MG tablet Take 1 tablet by mouth at   bedtime  . nitroGLYCERIN (NITROSTAT) 0.4 MG SL tablet Place 0.4 mg under the tongue every 5 (five) minutes as needed.    Marland Kitchen omeprazole (PRILOSEC) 20 MG capsule Take 1 capsule by mouth   daily  . sotalol (BETAPACE) 120 MG tablet Take 1 tablet by mouth  In the Am only  . warfarin (COUMADIN) 4 MG tablet Take one to two daily as directed.     Review of Systems  Constitutional: Negative.   HENT: Negative.   Eyes: Negative.   Respiratory: Negative.   Cardiovascular: Positive for palpitations.  Gastrointestinal: Negative.   Endocrine: Negative.   Musculoskeletal: Positive for gait problem.  Skin: Negative.   Allergic/Immunologic: Negative.   Neurological: Negative.   Hematological: Negative.   Psychiatric/Behavioral: Negative.   All other systems reviewed and are negative.   BP 140/72  Pulse 66  Ht 5\' 2"  (1.575 m)  Wt 125 lb 8 oz (56.926 kg)  BMI 22.95 kg/m2\  Physical Exam  Nursing note and vitals reviewed. Constitutional: She is oriented to person, place, and time. She appears well-developed and well-nourished.  HENT:  Head: Normocephalic.  Nose: Nose normal.  Mouth/Throat: Oropharynx is clear and moist.  Eyes: Conjunctivae are normal. Pupils are equal, round, and reactive to light.  Neck: Normal range of motion. Neck supple. No JVD present.  Cardiovascular: Normal rate, regular rhythm, S1 normal, S2 normal, normal heart sounds and intact distal pulses.  Exam reveals no gallop and no friction rub.   No  murmur heard. Pulmonary/Chest: Effort normal and breath sounds normal. No respiratory distress. She has no wheezes. She has no rales. She exhibits no tenderness.  Abdominal: Soft. Bowel sounds are normal. She exhibits no distension. There is no tenderness.  Musculoskeletal: Normal range of motion. She exhibits no edema and no tenderness.  Lymphadenopathy:    She has no cervical adenopathy.  Neurological: She is alert and oriented to person, place, and time. Coordination normal.  Skin: Skin is warm and dry. No rash noted. No erythema.  Psychiatric: She has a normal mood and affect. Her behavior is normal. Judgment and thought content normal.    Assessment and Plan

## 2013-12-31 ENCOUNTER — Encounter: Payer: Self-pay | Admitting: Internal Medicine

## 2013-12-31 ENCOUNTER — Ambulatory Visit (INDEPENDENT_AMBULATORY_CARE_PROVIDER_SITE_OTHER): Payer: Medicare Other | Admitting: Family Medicine

## 2013-12-31 ENCOUNTER — Ambulatory Visit (INDEPENDENT_AMBULATORY_CARE_PROVIDER_SITE_OTHER): Payer: Medicare Other | Admitting: *Deleted

## 2013-12-31 DIAGNOSIS — Z7901 Long term (current) use of anticoagulants: Secondary | ICD-10-CM

## 2013-12-31 DIAGNOSIS — I4891 Unspecified atrial fibrillation: Secondary | ICD-10-CM

## 2013-12-31 DIAGNOSIS — I495 Sick sinus syndrome: Secondary | ICD-10-CM

## 2013-12-31 DIAGNOSIS — Z5181 Encounter for therapeutic drug level monitoring: Secondary | ICD-10-CM | POA: Diagnosis not present

## 2013-12-31 LAB — MDC_IDC_ENUM_SESS_TYPE_REMOTE
Battery Impedance: 2606 Ohm
Battery Remaining Longevity: 20 mo
Battery Voltage: 2.7 V
Brady Statistic AP VP Percent: 1 %
Brady Statistic AP VS Percent: 95 %
Brady Statistic AS VP Percent: 0 %
Lead Channel Impedance Value: 440 Ohm
Lead Channel Impedance Value: 787 Ohm
Lead Channel Pacing Threshold Amplitude: 0.875 V
Lead Channel Pacing Threshold Pulse Width: 0.4 ms
Lead Channel Setting Pacing Amplitude: 2 V
Lead Channel Setting Pacing Amplitude: 2.5 V
Lead Channel Setting Pacing Pulse Width: 0.4 ms
Lead Channel Setting Sensing Sensitivity: 2 mV
MDC IDC MSMT LEADCHNL RV PACING THRESHOLD AMPLITUDE: 0.75 V
MDC IDC MSMT LEADCHNL RV PACING THRESHOLD PULSEWIDTH: 0.4 ms
MDC IDC MSMT LEADCHNL RV SENSING INTR AMPL: 5.6 mV
MDC IDC SESS DTM: 20150914142132
MDC IDC STAT BRADY AS VS PERCENT: 4 %

## 2013-12-31 LAB — POCT INR: INR: 2.4

## 2013-12-31 NOTE — Progress Notes (Signed)
Remote pacemaker transmission.   

## 2014-01-15 ENCOUNTER — Encounter: Payer: Self-pay | Admitting: Internal Medicine

## 2014-01-15 ENCOUNTER — Ambulatory Visit (INDEPENDENT_AMBULATORY_CARE_PROVIDER_SITE_OTHER): Payer: Medicare Other | Admitting: Internal Medicine

## 2014-01-15 VITALS — BP 110/62 | HR 81 | Temp 97.9°F | Resp 16 | Wt 125.2 lb

## 2014-01-15 DIAGNOSIS — I5032 Chronic diastolic (congestive) heart failure: Secondary | ICD-10-CM

## 2014-01-15 DIAGNOSIS — R197 Diarrhea, unspecified: Secondary | ICD-10-CM

## 2014-01-15 DIAGNOSIS — K219 Gastro-esophageal reflux disease without esophagitis: Secondary | ICD-10-CM

## 2014-01-15 DIAGNOSIS — Z23 Encounter for immunization: Secondary | ICD-10-CM | POA: Diagnosis not present

## 2014-01-15 DIAGNOSIS — I480 Paroxysmal atrial fibrillation: Secondary | ICD-10-CM

## 2014-01-15 DIAGNOSIS — I509 Heart failure, unspecified: Secondary | ICD-10-CM

## 2014-01-15 DIAGNOSIS — I4891 Unspecified atrial fibrillation: Secondary | ICD-10-CM

## 2014-01-15 MED ORDER — OMEPRAZOLE 20 MG PO CPDR: 20.0000 mg | DELAYED_RELEASE_CAPSULE | Freq: Two times a day (BID) | ORAL | Status: DC

## 2014-01-15 MED ORDER — LEVOTHYROXINE SODIUM 50 MCG PO TABS: 50.0000 ug | ORAL_TABLET | Freq: Every day | ORAL | Status: DC

## 2014-01-15 NOTE — Assessment & Plan Note (Signed)
Had another spell this weekend---and it debilitated her (though not lasting long) Has recovered now

## 2014-01-15 NOTE — Assessment & Plan Note (Signed)
Doesn't seem infectious Discussed trying probiotic for a while

## 2014-01-15 NOTE — Assessment & Plan Note (Signed)
Ongoing symptoms She feels like this may precipitate the a fib Will increase prilosec to bid

## 2014-01-15 NOTE — Progress Notes (Signed)
Subjective:    Patient ID: Amanda [name], female    DOB: [birth year], 79 y.o.   MRN: [id number]  HPI Here with daughter [name] Doing "not good"  Bad pain in left neck Very stiff Started yesterday afternoon Tried heat rub--helped some  Had episode of heart going fast while out of town 4 days ago Was so out of it-- "almost unresponsive" Didn't stay fast for long Slept for a while and finally was able to eat a bit Better again by the next day She thinks this all starts with acid reflux  No chest pain during spell No SOB Gets some nausea and diarrhea with the spell Loose stools returned today---already 5 stools today No fever No blood Appetite still not good  No recent falls Still staggers some Walks in apartment but limiting time outside---still waters flowers, sweeps patio  Current Outpatient Prescriptions on File Prior to Visit  Medication Sig Dispense Refill  . acetaminophen (TYLENOL) 500 MG tablet Take 500 mg by mouth as needed.        . cholecalciferol (VITAMIN D) 1000 UNITS tablet Take 1,000 Units by mouth daily.      . hydrochlorothiazide (HYDRODIURIL) 25 MG tablet Take 1 tablet (25 mg total) by mouth daily.  90 tablet  3  . levothyroxine (SYNTHROID, LEVOTHROID) 50 MCG tablet Take 34mcg on Tuesday,  Thursday, Saturday and  Sunday, 75 mcg on Monday,  Wednesday and Friday  110 tablet  1  . lisinopril (PRINIVIL,ZESTRIL) 20 MG tablet Take 1 tablet (20 mg total) by mouth daily.  90 tablet  3  . mirtazapine (REMERON) 15 MG tablet Take 1 tablet by mouth at   bedtime  90 tablet  3  . nitroGLYCERIN (NITROSTAT) 0.4 MG SL tablet Place 0.4 mg under the tongue every 5 (five) minutes as needed.        Marland Kitchen omeprazole (PRILOSEC) 20 MG capsule Take 1 capsule by mouth   daily  90 capsule  3  . sotalol (BETAPACE) 120 MG tablet Take 1 tablet by mouth  twice a day  180 tablet  3  . warfarin (COUMADIN) 4 MG tablet Take one to two daily as directed.  90 tablet  1   No current  facility-administered medications on file prior to visit.    Allergies  Allergen Reactions  . Amoxicillin Diarrhea    Weakness and atrial fib  . Augmentin [Amoxicillin-Pot Clavulanate] Diarrhea    Weakness and atrial fib  . Sulfonamide Derivatives     Past Medical History  Diagnosis Date  . Atrial fibrillation   . Hyperlipidemia   . Hypertension   . Hypothyroidism     after Rx for thyroid cancer  . Osteoarthritis   . Sleep disturbance   . Urinary incontinence     usually from UTI's  . Mitral regurgitation   . Depression   . Pacemaker   . GERD (gastroesophageal reflux disease)     Past Surgical History  Procedure Laterality Date  . Tonsillectomy and adenoidectomy  1937  . Vesicovaginal fistula closure w/ tah  1972  . Goiter removed  1976    cancerous. Then got RAI  . Breast lumpectomy  1983  . Breast biopsy  2002  . Pacemaker insertion  5/07    after syncope  . Cataract extraction, bilateral      Family History  Problem Relation Age of Onset  . Diabetes Mother   . Stroke Sister   . Coronary artery disease Brother   . Prostate  cancer Brother   . Breast cancer Neg Hx   . Colon cancer Neg Hx     History   Social History  . Marital Status: Widowed    Spouse Name: N/A    Number of Children: 2  . Years of Education: N/A   Occupational History  . homemaker    Social History Main Topics  . Smoking status: Never Smoker   . Smokeless tobacco: Never Used  . Alcohol Use: No  . Drug Use: No  . Sexual Activity: Not on file   Other Topics Concern  . Not on file   Social History Narrative   Widowed 2002. 1 local daughter. 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 Was homemaker. Farmed and gardened. 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    Daughter [name]) is health care POA.    Discussed DNR and she requests    Would not want tube feeds if cognitively unaware   Review of Systems Weight is stable  Sleep is still not great--does okay about half the night. Mind will wander    Objective:    Physical Exam  Constitutional: She appears well-developed. No distress.  Neck: Normal range of motion. Neck supple. No thyromegaly present.  Cardiovascular: Normal rate, regular rhythm and normal heart sounds.  Exam reveals no gallop.   No murmur heard. Pulmonary/Chest: Effort normal and breath sounds normal. No respiratory distress. She has no wheezes. She has no rales.  Abdominal: Soft. Bowel sounds are normal. She exhibits no distension. There is no tenderness. There is no rebound and no guarding.  Musculoskeletal: She exhibits no edema and no tenderness.  Lymphadenopathy:    She has no cervical adenopathy.  Psychiatric: She has a normal mood and affect. Her behavior is normal.          Assessment & Plan:

## 2014-01-15 NOTE — Assessment & Plan Note (Signed)
Neutral fluid status Does okay when in sinus rhythm

## 2014-01-15 NOTE — Progress Notes (Signed)
Pre visit review using our clinic review tool, if applicable. No additional management support is needed unless otherwise documented below in the visit note. 

## 2014-01-16 ENCOUNTER — Other Ambulatory Visit: Payer: Self-pay | Admitting: Internal Medicine

## 2014-01-16 ENCOUNTER — Telehealth: Payer: Self-pay

## 2014-01-16 NOTE — Telephone Encounter (Signed)
Per Ebony Hail (front staff) patient's daughter called requesting acute office visit today stating patient is having severe lower back and lower neck pain. Daughter also stated that patient usually does not complain about discomfort. Patient last office visit was 01/15/14. You do not have any acute appointments left today. Please advise.

## 2014-01-16 NOTE — Telephone Encounter (Signed)
Per Dr. Silvio Pate offer patient appt on 01/17/14 at 2pm, 4:15pm, or 4:30pm.

## 2014-01-16 NOTE — Telephone Encounter (Signed)
Please see if this will work for them (unless someone else has something this afternoon)

## 2014-01-16 NOTE — Telephone Encounter (Signed)
Received refill request from [facility] for Levothyroxine.  According to medication list, patient is taking it once a day.  Also [facility] with one refill.   Ok to refill?

## 2014-01-17 ENCOUNTER — Ambulatory Visit (INDEPENDENT_AMBULATORY_CARE_PROVIDER_SITE_OTHER): Payer: Medicare Other | Admitting: Internal Medicine

## 2014-01-17 ENCOUNTER — Encounter: Payer: Self-pay | Admitting: Internal Medicine

## 2014-01-17 VITALS — BP 122/70 | HR 74 | Temp 97.8°F | Resp 14 | Wt 125.5 lb

## 2014-01-17 DIAGNOSIS — M62838 Other muscle spasm: Secondary | ICD-10-CM

## 2014-01-17 DIAGNOSIS — M6248 Contracture of muscle, other site: Secondary | ICD-10-CM

## 2014-01-17 MED ORDER — METHYLPREDNISOLONE ACETATE 40 MG/ML IJ SUSP
80.0000 mg | Freq: Once | INTRAMUSCULAR | Status: AC
  Administered 2014-01-17: 80 mg via INTRAMUSCULAR

## 2014-01-17 NOTE — Patient Instructions (Signed)
Please continue the heat on your neck and regular acetaminophen. If you are still having a lot of problems next week, I can refer you for physical therapy.

## 2014-01-17 NOTE — Telephone Encounter (Signed)
I will review with her at her visit today. I do think the dose varies by day

## 2014-01-17 NOTE — Assessment & Plan Note (Signed)
Causing severe pain Will continue the heat and acetaminophen She has responded to systemic corticosteroids with similar symptoms in past and she is travelling this weekend---will go ahead with depomedrol 80mg  to see if that helps

## 2014-01-17 NOTE — Addendum Note (Signed)
Addended by: Geni Bers on: 01/17/2014 02:39 PM   Modules accepted: Orders

## 2014-01-17 NOTE — Progress Notes (Signed)
Subjective:    Patient ID: Amanda [name], female    DOB: 1919-08-02, 80 y.o.   MRN: [id number]  HPI Here again with daughter Amanda [name]  Has had worsening pain in her back Had told me about the neck but not how bad it is in the back Has to sleep in the chair No pain into her legs No leg weakness  Pain in back is better but severe in neck Hard to bend over Has tried heat and tylenol--this has helped some  Some burning dysuria Some chills took some left over cipro last night Does seem some better today (has another 5 to take)  Current Outpatient Prescriptions on File Prior to Visit  Medication Sig Dispense Refill  . acetaminophen (TYLENOL) 500 MG tablet Take 500 mg by mouth as needed.        . cholecalciferol (VITAMIN D) 1000 UNITS tablet Take 1,000 Units by mouth daily.      . hydrochlorothiazide (HYDRODIURIL) 25 MG tablet Take 1 tablet (25 mg total) by mouth daily.  90 tablet  3  . levothyroxine (SYNTHROID, LEVOTHROID) 50 MCG tablet Take 1 tablet (50 mcg total) by mouth daily before breakfast.  14 tablet  1  . lisinopril (PRINIVIL,ZESTRIL) 20 MG tablet Take 1 tablet (20 mg total) by mouth daily.  90 tablet  3  . mirtazapine (REMERON) 15 MG tablet Take 1 tablet by mouth at   bedtime  90 tablet  3  . nitroGLYCERIN (NITROSTAT) 0.4 MG SL tablet Place 0.4 mg under the tongue every 5 (five) minutes as needed.        Marland Kitchen omeprazole (PRILOSEC) 20 MG capsule Take 1 capsule (20 mg total) by mouth 2 (two) times daily before a meal.  180 capsule  3  . warfarin (COUMADIN) 4 MG tablet Take one to two daily as directed.  90 tablet  1   No current facility-administered medications on file prior to visit.    Allergies  Allergen Reactions  . Amoxicillin Diarrhea    Weakness and atrial fib  . Augmentin [Amoxicillin-Pot Clavulanate] Diarrhea    Weakness and atrial fib  . Sulfonamide Derivatives     Past Medical History  Diagnosis Date  . Atrial fibrillation   . Hyperlipidemia   .  Hypertension   . Hypothyroidism     after Rx for thyroid cancer  . Osteoarthritis   . Sleep disturbance   . Urinary incontinence     usually from UTI's  . Mitral regurgitation   . Depression   . Pacemaker   . GERD (gastroesophageal reflux disease)     Past Surgical History  Procedure Laterality Date  . Tonsillectomy and adenoidectomy  1937  . Vesicovaginal fistula closure w/ tah  1972  . Goiter removed  1976    cancerous. Then got RAI  . Breast lumpectomy  1983  . Breast biopsy  2002  . Pacemaker insertion  5/07    after syncope  . Cataract extraction, bilateral      Family History  Problem Relation Age of Onset  . Diabetes Mother   . Stroke Sister   . Coronary artery disease Brother   . Prostate cancer Brother   . Breast cancer Neg Hx   . Colon cancer Neg Hx     History   Social History  . Marital Status: Widowed    Spouse Name: N/A    Number of Children: 2  . Years of Education: N/A   Occupational History  . homemaker  Social History Main Topics  . Smoking status: Never Smoker   . Smokeless tobacco: Never Used  . Alcohol Use: No  . Drug Use: No  . Sexual Activity: Not on file   Other Topics Concern  . Not on file   Social History Narrative   Widowed 2002. 1 local daughter. 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    Daughter Amanda [name]) is health care POA.    Discussed DNR and she requests    Would not want tube feeds if cognitively unaware   Review of Systems Diarrhea persists but not as bad Eats "because I have to"    Objective:   Physical Exam  Constitutional: She appears well-developed. No distress.  Neck:  Has some spasm along posterior portion of SCM bilaterally and some in traps No anterior problems  Neurological:  No arm weakness          Assessment & Plan:

## 2014-01-17 NOTE — Progress Notes (Signed)
Pre visit review using our clinic review tool, if applicable. No additional management support is needed unless otherwise documented below in the visit note. 

## 2014-01-21 ENCOUNTER — Encounter: Payer: Self-pay | Admitting: Cardiology

## 2014-01-29 NOTE — Progress Notes (Signed)
Recent episode of atrial fibrillation at the end of September 2015 Would call her to ask her if she would like to increase the sotalol Would take 120 mg in the morning, 60 mg in the evening

## 2014-01-30 ENCOUNTER — Telehealth: Payer: Self-pay

## 2014-01-30 NOTE — Telephone Encounter (Signed)
Spoke w/ pt's daughter.  Advised her of Dr. Donivan Scull recommendation.  She verbalizes understanding and will call back w/ any questions or concerns.

## 2014-01-30 NOTE — Telephone Encounter (Signed)
Amanda Merritts, MD at 01/29/2014 6:48 PM     Status: Signed        Recent episode of atrial fibrillation at the end of September 2015  Would call her to ask her if she would like to increase the sotalol  Would take 120 mg in the morning, 60 mg in the evening

## 2014-02-06 ENCOUNTER — Other Ambulatory Visit: Payer: Self-pay | Admitting: Internal Medicine

## 2014-02-06 ENCOUNTER — Telehealth: Payer: Self-pay

## 2014-02-06 MED ORDER — CIPROFLOXACIN HCL 250 MG PO TABS: 250.0000 mg | ORAL_TABLET | Freq: Two times a day (BID) | ORAL | Status: DC

## 2014-02-06 NOTE — Telephone Encounter (Signed)
rx sent to pharmacy by e-script Spoke with daughter and advised results.

## 2014-02-06 NOTE — Telephone Encounter (Signed)
Pt would need to be seen

## 2014-02-06 NOTE — Telephone Encounter (Signed)
Okay to send Rx for cipro 250mg  #6 x 0 If not better, than needs to be seen

## 2014-02-06 NOTE — Telephone Encounter (Signed)
pts daughter, [name] left v/m requesting med that pt has been previously prescribed for UTI (thinks med was cipro) sent to [facility]. Pt is visiting a daughter in [place] and has bladder infection. Burning upon urination. [name] request med sent to pharmacy today. [name] request cb.

## 2014-02-08 ENCOUNTER — Ambulatory Visit (INDEPENDENT_AMBULATORY_CARE_PROVIDER_SITE_OTHER): Payer: Medicare Other | Admitting: Emergency Medicine

## 2014-02-08 ENCOUNTER — Ambulatory Visit (INDEPENDENT_AMBULATORY_CARE_PROVIDER_SITE_OTHER): Payer: Medicare Other

## 2014-02-08 ENCOUNTER — Telehealth: Payer: Self-pay

## 2014-02-08 VITALS — BP 124/60 | HR 66 | Temp 98.1°F | Resp 16 | Ht 63.0 in | Wt 125.0 lb

## 2014-02-08 DIAGNOSIS — S93402A Sprain of unspecified ligament of left ankle, initial encounter: Secondary | ICD-10-CM

## 2014-02-08 DIAGNOSIS — M25572 Pain in left ankle and joints of left foot: Secondary | ICD-10-CM

## 2014-02-08 NOTE — Telephone Encounter (Signed)
pts daughter said pt fell on 02/06/14 and injured ankle and foot; ? Fx. Pt is on her way back from [place] and should be home today 3PM or after. No available appts at [facility] or [place] office. pts daughter said has taken pt to UC in [place] before. Advised pt could go to [facility] Dr Essie Christine. pts daughter will decide and take pt to UC upon her return.

## 2014-02-08 NOTE — Progress Notes (Signed)
[facility] [address], [place] [address] [phone]  Date:  02/08/2014   Name:  Amanda [name]   DOB:  [birth year]   MRN:  [id number]  PCP:  Viviana Simpler, MD    Chief Complaint: Fall and Ankle Pain   History of Present Illness:  Amanda [name] is a 80 y.o. very pleasant female patient who presents with the following:  Twisted her left ankle while stepping out of a door three days ago.  Now not able to bear weight. Denies injury to knee, hips, or wrists. Swelling and tenderness. No improvement with over the counter medications or other home remedies.  Denies other complaint or health concern today.   Patient Active Problem List   Diagnosis Date Noted  . Muscle spasms of neck 01/17/2014  . Diarrhea 01/15/2014  . Routine general medical examination at a health care facility 07/16/2013  . Thoracic aorta atherosclerosis 07/16/2013  . Encounter for therapeutic drug monitoring 06/11/2013  . Chronic diastolic CHF (congestive heart failure) 05/29/2013  . Chronic renal insufficiency, stage III (moderate) 12/16/2011  . GERD (gastroesophageal reflux disease)   . SICK SINUS/ TACHY-BRADY SYNDROME 12/15/2009  . PPM-Medtronic 12/15/2009  . MITRAL VALVE DISORDERS 12/08/2009  . ACTINIC KERATOSIS 07/23/2009  . Episodic mood disorder 05/05/2009  . HEARING LOSS 04/25/2009  . HYPOTHYROIDISM 12/27/2008  . HYPERLIPIDEMIA 12/27/2008  . HYPERTENSION 12/27/2008  . ATRIAL FIBRILLATION 12/27/2008  . OSTEOARTHRITIS 12/27/2008  . SLEEP DISORDER 12/27/2008  . URINARY INCONTINENCE 12/27/2008    Past Medical History  Diagnosis Date  . Atrial fibrillation   . Hyperlipidemia   . Hypertension   . Hypothyroidism     after Rx for thyroid cancer  . Osteoarthritis   . Sleep disturbance   . Urinary incontinence     usually from UTI's  . Mitral regurgitation   . Depression   . Pacemaker   . GERD (gastroesophageal reflux disease)     Past Surgical History  Procedure  Laterality Date  . Tonsillectomy and adenoidectomy  1937  . Vesicovaginal fistula closure w/ tah  1972  . Goiter removed  1976    cancerous. Then got RAI  . Breast lumpectomy  1983  . Breast biopsy  2002  . Pacemaker insertion  5/07    after syncope  . Cataract extraction, bilateral      History  Substance Use Topics  . Smoking status: Never Smoker   . Smokeless tobacco: Never Used  . Alcohol Use: No    Family History  Problem Relation Age of Onset  . Diabetes Mother   . Stroke Sister   . Coronary artery disease Brother   . Prostate cancer Brother   . Breast cancer Neg Hx   . Colon cancer Neg Hx     Allergies  Allergen Reactions  . Amoxicillin Diarrhea    Weakness and atrial fib  . Augmentin [Amoxicillin-Pot Clavulanate] Diarrhea    Weakness and atrial fib  . Sulfonamide Derivatives     Medication list has been reviewed and updated.  Current Outpatient Prescriptions on File Prior to Visit  Medication Sig Dispense Refill  . acetaminophen (TYLENOL) 500 MG tablet Take 500 mg by mouth as needed.        . cholecalciferol (VITAMIN D) 1000 UNITS tablet Take 1,000 Units by mouth daily.      . hydrochlorothiazide (HYDRODIURIL) 25 MG tablet Take 1 tablet (25 mg total) by mouth daily.  90 tablet  3  . levothyroxine (SYNTHROID, LEVOTHROID)  50 MCG tablet Take 1 tablet (50 mcg total) by mouth daily before breakfast.  14 tablet  1  . lisinopril (PRINIVIL,ZESTRIL) 20 MG tablet Take 1 tablet (20 mg total) by mouth daily.  90 tablet  3  . mirtazapine (REMERON) 15 MG tablet Take 1 tablet by mouth at   bedtime  90 tablet  3  . nitroGLYCERIN (NITROSTAT) 0.4 MG SL tablet Place 0.4 mg under the tongue every 5 (five) minutes as needed.        Marland Kitchen omeprazole (PRILOSEC) 20 MG capsule Take 1 capsule (20 mg total) by mouth 2 (two) times daily before a meal.  180 capsule  3  . sotalol (BETAPACE) 120 MG tablet Take 1/2 tablet by mouth  twice a day      . warfarin (COUMADIN) 4 MG tablet Take one to  two daily as directed.  90 tablet  1   No current facility-administered medications on file prior to visit.    Review of Systems:  As per HPI, otherwise negative.    Physical Examination: Filed Vitals:   02/08/14 1422  BP: 124/60  Pulse: 66  Temp: 98.1 F (36.7 C)  Resp: 16   Filed Vitals:   02/08/14 1422  Height: 5\' 3"  (1.6 m)  Weight: 125 lb (56.7 kg)   Body mass index is 22.15 kg/(m^2). Ideal Body Weight: Weight in (lb) to have BMI = 25: 140.8   GEN: WDWN, NAD, Non-toxic, Alert & Oriented x 3 HEENT: Atraumatic, Normocephalic.  Ears and Nose: No external deformity. EXTR: No clubbing/cyanosis/edema NEURO: Normal gait.  PSYCH: Normally interactive. Conversant. Not depressed or anxious appearing.  Calm demeanor.  LEFT ankle Swollen and tender. No deformity or ecchymosis   Assessment and Plan: Sprain ankle RICE Tylenol Gilford Rile  Signed,  Ellison Carwin, MD   [facility] reading (PRIMARY) by  Dr. Ouida Sills.  Negative .

## 2014-02-08 NOTE — Telephone Encounter (Signed)
She lives in [place] so probably would prefer urgent care in [place] (though not on Epic) Have daughter let us know what she decides and how things work out

## 2014-02-08 NOTE — Telephone Encounter (Signed)
Spoke with daughter and pt went to UC (notes in chart) has really bad sprain

## 2014-02-08 NOTE — Telephone Encounter (Signed)
Left message asking daughter to return my call.

## 2014-02-09 NOTE — Telephone Encounter (Signed)
noted 

## 2014-02-11 ENCOUNTER — Ambulatory Visit (INDEPENDENT_AMBULATORY_CARE_PROVIDER_SITE_OTHER): Payer: Medicare Other | Admitting: *Deleted

## 2014-02-11 DIAGNOSIS — Z7901 Long term (current) use of anticoagulants: Secondary | ICD-10-CM | POA: Diagnosis not present

## 2014-02-11 DIAGNOSIS — Z5181 Encounter for therapeutic drug level monitoring: Secondary | ICD-10-CM | POA: Diagnosis not present

## 2014-02-11 DIAGNOSIS — I4891 Unspecified atrial fibrillation: Secondary | ICD-10-CM | POA: Diagnosis not present

## 2014-02-11 LAB — POCT INR: INR: 4.7

## 2014-02-25 ENCOUNTER — Telehealth: Payer: Self-pay

## 2014-02-25 ENCOUNTER — Ambulatory Visit (INDEPENDENT_AMBULATORY_CARE_PROVIDER_SITE_OTHER): Payer: Medicare Other | Admitting: Internal Medicine

## 2014-02-25 ENCOUNTER — Encounter: Payer: Self-pay | Admitting: Internal Medicine

## 2014-02-25 ENCOUNTER — Other Ambulatory Visit: Payer: Self-pay | Admitting: Internal Medicine

## 2014-02-25 VITALS — BP 148/80 | HR 85 | Temp 98.4°F | Wt 123.0 lb

## 2014-02-25 DIAGNOSIS — N3080 Other cystitis without hematuria: Secondary | ICD-10-CM

## 2014-02-25 DIAGNOSIS — N3090 Cystitis, unspecified without hematuria: Secondary | ICD-10-CM | POA: Insufficient documentation

## 2014-02-25 DIAGNOSIS — R300 Dysuria: Secondary | ICD-10-CM

## 2014-02-25 LAB — POCT URINALYSIS DIPSTICK
BILIRUBIN UA: NEGATIVE
Blood, UA: NEGATIVE
GLUCOSE UA: NEGATIVE
Ketones, UA: NEGATIVE
LEUKOCYTES UA: NEGATIVE
NITRITE UA: NEGATIVE
PH UA: 6
Protein, UA: NEGATIVE
Spec Grav, UA: 1.025
UROBILINOGEN UA: NEGATIVE

## 2014-02-25 MED ORDER — TRIMETHOPRIM 100 MG PO TABS: 100.0000 mg | ORAL_TABLET | Freq: Every day | ORAL | Status: DC

## 2014-02-25 NOTE — Telephone Encounter (Signed)
Amanda [name],pts daughter left v/m requesting med for bladder infection, burning upon urination; no fever, no confusion, and no frequency. Pt started with burning upon urination last few days; pt started taking Cipro 3 days ago and has 2 pills left;does not want to bring pt to office and request refill to [facility]. Amanda Raring has already scheduled pt today at 3:15 with Dr Silvio Pate if pt has to be seen. [name] request cb.

## 2014-02-25 NOTE — Telephone Encounter (Signed)
Spoke with daughter and advised results.  

## 2014-02-25 NOTE — Assessment & Plan Note (Signed)
Negative urinalysis now Repeated symptoms with brief reprieve with cipro Will restart preventative (since she was on before)===will try trimethoprim Recommended probiotic

## 2014-02-25 NOTE — Progress Notes (Signed)
Pre visit review using our clinic review tool, if applicable. No additional management support is needed unless otherwise documented below in the visit note. 

## 2014-02-25 NOTE — Patient Instructions (Signed)
Please start a daily probiotic. You can ask the pharmacist for advice on which one to take (there are some common ones like "Align") You can take the cipro for 2 doses, then start the daily trimethoprim to try to prevent another bladder infection.

## 2014-02-25 NOTE — Telephone Encounter (Signed)
Have her keep appt If she has taken cipro without resolution, it either isn't UTI or she needs urine culture sent. Make sure she doesn't void right before appt since I will need a specimen

## 2014-02-25 NOTE — Progress Notes (Signed)
Subjective:    Patient ID: Amanda [name], female    DOB: 1919/08/23, 80 y.o.   MRN: [id number]  HPI Having recurrent spells of burning dysuria Started 3 weeks ago Initially cipro helped  Now with recurrence but cipro not helping  No perineal rash No fever  Current Outpatient Prescriptions on File Prior to Visit  Medication Sig Dispense Refill  . acetaminophen (TYLENOL) 500 MG tablet Take 500 mg by mouth as needed.      . cholecalciferol (VITAMIN D) 1000 UNITS tablet Take 1,000 Units by mouth daily.    . hydrochlorothiazide (HYDRODIURIL) 25 MG tablet Take 1 tablet (25 mg total) by mouth daily. 90 tablet 3  . levothyroxine (SYNTHROID, LEVOTHROID) 50 MCG tablet Take 1 tablet (50 mcg total) by mouth daily before breakfast. 14 tablet 1  . lisinopril (PRINIVIL,ZESTRIL) 20 MG tablet Take 1 tablet (20 mg total) by mouth daily. 90 tablet 3  . mirtazapine (REMERON) 15 MG tablet Take 1 tablet by mouth at   bedtime 90 tablet 3  . nitroGLYCERIN (NITROSTAT) 0.4 MG SL tablet Place 0.4 mg under the tongue every 5 (five) minutes as needed.      Marland Kitchen omeprazole (PRILOSEC) 20 MG capsule Take 1 capsule (20 mg total) by mouth 2 (two) times daily before a meal. 180 capsule 3  . sotalol (BETAPACE) 120 MG tablet Take 1/2 tablet by mouth  twice a day    . warfarin (COUMADIN) 4 MG tablet Take one to two daily as directed. 90 tablet 1   No current facility-administered medications on file prior to visit.    Allergies  Allergen Reactions  . Amoxicillin Diarrhea    Weakness and atrial fib  . Augmentin [Amoxicillin-Pot Clavulanate] Diarrhea    Weakness and atrial fib  . Sulfonamide Derivatives     Past Medical History  Diagnosis Date  . Atrial fibrillation   . Hyperlipidemia   . Hypertension   . Hypothyroidism     after Rx for thyroid cancer  . Osteoarthritis   . Sleep disturbance   . Urinary incontinence     usually from UTI's  . Mitral regurgitation   . Depression   . Pacemaker   . GERD  (gastroesophageal reflux disease)     Past Surgical History  Procedure Laterality Date  . Tonsillectomy and adenoidectomy  1937  . Vesicovaginal fistula closure w/ tah  1972  . Goiter removed  1976    cancerous. Then got RAI  . Breast lumpectomy  1983  . Breast biopsy  2002  . Pacemaker insertion  5/07    after syncope  . Cataract extraction, bilateral      Family History  Problem Relation Age of Onset  . Diabetes Mother   . Stroke Sister   . Coronary artery disease Brother   . Prostate cancer Brother   . Breast cancer Neg Hx   . Colon cancer Neg Hx     History   Social History  . Marital Status: Widowed    Spouse Name: N/A    Number of Children: 2  . Years of Education: N/A   Occupational History  . homemaker    Social History Main Topics  . Smoking status: Never Smoker   . Smokeless tobacco: Never Used  . Alcohol Use: No  . Drug Use: No  . Sexual Activity: Not on file   Other Topics Concern  . Not on file   Social History Narrative   Widowed 2002. 1 local daughter. Other in  [place]. Was homemaker. Farmed and gardened. 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    Daughter [name]) is health care POA.    Discussed DNR and she requests    Would not want tube feeds if cognitively unaware   Review of Systems No vomiting or diarrhea Appetite is fair    Objective:   Physical Exam  Constitutional: She appears well-developed and well-nourished. No distress.  Abdominal: Soft. She exhibits no distension. There is no tenderness. There is no rebound and no guarding.  Musculoskeletal:  No CVA tenderness          Assessment & Plan:

## 2014-03-01 ENCOUNTER — Other Ambulatory Visit: Payer: Self-pay | Admitting: Internal Medicine

## 2014-03-11 ENCOUNTER — Other Ambulatory Visit (INDEPENDENT_AMBULATORY_CARE_PROVIDER_SITE_OTHER): Payer: Medicare Other

## 2014-03-11 DIAGNOSIS — Z7901 Long term (current) use of anticoagulants: Secondary | ICD-10-CM

## 2014-03-11 DIAGNOSIS — Z5181 Encounter for therapeutic drug level monitoring: Secondary | ICD-10-CM

## 2014-03-11 DIAGNOSIS — I4891 Unspecified atrial fibrillation: Secondary | ICD-10-CM | POA: Diagnosis not present

## 2014-03-11 LAB — PROTIME-INR
INR: 2.6 ratio — ABNORMAL HIGH (ref 0.8–1.0)
PROTHROMBIN TIME: 27.7 s — AB (ref 9.6–13.1)

## 2014-03-26 ENCOUNTER — Encounter: Payer: Self-pay | Admitting: Internal Medicine

## 2014-03-26 ENCOUNTER — Ambulatory Visit (INDEPENDENT_AMBULATORY_CARE_PROVIDER_SITE_OTHER): Payer: Medicare Other | Admitting: Internal Medicine

## 2014-03-26 ENCOUNTER — Encounter: Payer: PRIVATE HEALTH INSURANCE | Admitting: Internal Medicine

## 2014-03-26 VITALS — BP 112/68 | HR 71 | Ht 62.0 in | Wt 125.0 lb

## 2014-03-26 DIAGNOSIS — I4891 Unspecified atrial fibrillation: Secondary | ICD-10-CM

## 2014-03-26 DIAGNOSIS — Z79899 Other long term (current) drug therapy: Secondary | ICD-10-CM

## 2014-03-26 DIAGNOSIS — Z95810 Presence of automatic (implantable) cardiac defibrillator: Secondary | ICD-10-CM | POA: Diagnosis not present

## 2014-03-26 DIAGNOSIS — I495 Sick sinus syndrome: Secondary | ICD-10-CM

## 2014-03-26 DIAGNOSIS — N390 Urinary tract infection, site not specified: Secondary | ICD-10-CM | POA: Diagnosis not present

## 2014-03-26 NOTE — Patient Instructions (Addendum)
No changes made today. You are doing well.    Your physician wants you to follow-up in: 1 year with Dr. Caryl Comes. You will receive a reminder letter in the mail two months in advance. If you don't receive a letter, please call our office to schedule the follow-up appointment.   Your physician recommends that you return for lab at the time of you INR check: Potassium and magnesium   Remote monitoring is used to monitor your Pacemaker of ICD from home. This monitoring reduces the number of office visits required to check your device to one time per year. It allows [place] to keep an eye on the functioning of your device to ensure it is working properly. You are scheduled for a device check from home on 06/25/14. You may send your transmission at any time that day. If you have a wireless device, the transmission will be sent automatically. After your physician reviews your transmission, you will receive a postcard with your next transmission date.

## 2014-03-26 NOTE — Progress Notes (Signed)
Patient Care Team: Venia Carbon, MD as PCP - General   HPI  Amanda [name] is a 80 y.o. female seen in followup with a previously implanted pacemaker in 2007 for syncope and apparently tachybradycardia syndrome; she has a history of paroxysmal atrial fibrillation managed with sotalol.  Last creatinine was  1.4 with GFR 39 --3/15  At the last visit we decreased the sotalol on creatinine clearance.  Her atrial fibrillation burden 0.2% although she has complaints of palpitations  Last echocardiogram was in [place] EF was about 65%  Overall she and her daughter are pleased with her performance. She has developed chronic UTIs and has been started on chronic trimethoprim although her AVS  ciprofloxacin   Past Medical History  Diagnosis Date  . Atrial fibrillation   . Hyperlipidemia   . Hypertension   . Hypothyroidism     after Rx for thyroid cancer  . Osteoarthritis   . Sleep disturbance   . Urinary incontinence     usually from UTI's  . Mitral regurgitation   . Depression   . Pacemaker   . GERD (gastroesophageal reflux disease)     Past Surgical History  Procedure Laterality Date  . Tonsillectomy and adenoidectomy  1937  . Vesicovaginal fistula closure w/ tah  1972  . Goiter removed  1976    cancerous. Then got RAI  . Breast lumpectomy  1983  . Breast biopsy  2002  . Pacemaker insertion  5/07    after syncope  . Cataract extraction, bilateral      Current Outpatient Prescriptions  Medication Sig Dispense Refill  . acetaminophen (TYLENOL) 500 MG tablet Take 500 mg by mouth as needed.      . cholecalciferol (VITAMIN D) 1000 UNITS tablet Take 1,000 Units by mouth daily.    . ciprofloxacin (CIPRO) 250 MG tablet   0  . hydrochlorothiazide (HYDRODIURIL) 25 MG tablet Take 1 tablet (25 mg total) by mouth daily. 90 tablet 3  . levothyroxine (SYNTHROID, LEVOTHROID) 50 MCG tablet Take 1 tablet (50 mcg total) by mouth daily before breakfast. 14 tablet 1  .  lisinopril (PRINIVIL,ZESTRIL) 20 MG tablet Take 1 tablet (20 mg total) by mouth daily. 90 tablet 3  . mirtazapine (REMERON) 15 MG tablet Take 1 tablet by mouth at   bedtime 90 tablet 3  . nitroGLYCERIN (NITROSTAT) 0.4 MG SL tablet Place 0.4 mg under the tongue every 5 (five) minutes as needed.      Marland Kitchen omeprazole (PRILOSEC) 20 MG capsule Take 1 capsule (20 mg total) by mouth 2 (two) times daily before a meal. 180 capsule 3  . sotalol (BETAPACE) 120 MG tablet Take 1/2 tablet by mouth  twice a day    . trimethoprim (TRIMPEX) 100 MG tablet Take 1 tablet (100 mg total) by mouth daily. 30 tablet 11  . warfarin (COUMADIN) 4 MG tablet Take 1 to 2 daily as  directed. 180 tablet 0   No current facility-administered medications for this visit.    Allergies  Allergen Reactions  . Amoxicillin Diarrhea    Weakness and atrial fib  . Augmentin [Amoxicillin-Pot Clavulanate] Diarrhea    Weakness and atrial fib  . Sulfonamide Derivatives     Review of Systems negative except from HPI and PMH  Physical Exam BP 112/68 mmHg  Pulse 71  Ht 5\' 2"  (1.575 m)  Wt 125 lb (56.7 kg)  BMI 22.86 kg/m2 Well developed and well nourished in no acute distress HENT normal E  scleral and icterus clear Neck Supple JVP flat; carotids brisk and full Clear to ausculation  Regular rate and rhythm, 2/6 murmur Soft with active bowel sounds No clubbing cyanosis Trace Edema Alert and oriented, grossly normal motor and sensory function Skin Warm and Dry  ECG demonstrates sinus rhythm at 71 Intervals 13/09/38  Assessment and  Plan  Atrial fibrillation  Pacemaker-[employer]  High risk medication surveillance  Chronic UTIs  She is relatively stable with her paroxysms of atrial fibrillation. Her last laboratories are little bit of a concern given the BUN/creatinine ratio. I wonder whether her diuretic should be down titrated. Blood work will be checked again next week by her PCP.  Blood pressure is  well-controlled.  At this point there is no evidence of proarrhythmic risk from her sotalol. Avoiding long-term QT prolonging drugs would be appropriate i.e. trimethoprim is better than Cipro.

## 2014-03-28 LAB — MDC_IDC_ENUM_SESS_TYPE_INCLINIC
Battery Impedance: 2615 Ohm
Battery Voltage: 2.7 V
Brady Statistic AP VP Percent: 1 %
Brady Statistic AP VS Percent: 95 %
Brady Statistic AS VP Percent: 0 %
Brady Statistic AS VS Percent: 4 %
Date Time Interrogation Session: 20151208145040
Lead Channel Impedance Value: 754 Ohm
Lead Channel Pacing Threshold Amplitude: 0.75 V
Lead Channel Pacing Threshold Pulse Width: 0.4 ms
Lead Channel Pacing Threshold Pulse Width: 0.4 ms
Lead Channel Sensing Intrinsic Amplitude: 4 mV
Lead Channel Setting Pacing Amplitude: 2.5 V
Lead Channel Setting Sensing Sensitivity: 2 mV
MDC IDC MSMT BATTERY REMAINING LONGEVITY: 20 mo
MDC IDC MSMT LEADCHNL RA SENSING INTR AMPL: 2.8 mV
MDC IDC MSMT LEADCHNL RV IMPEDANCE VALUE: 444 Ohm
MDC IDC MSMT LEADCHNL RV PACING THRESHOLD AMPLITUDE: 0.75 V
MDC IDC SET LEADCHNL RA PACING AMPLITUDE: 2 V
MDC IDC SET LEADCHNL RV PACING PULSEWIDTH: 0.4 ms

## 2014-04-08 ENCOUNTER — Ambulatory Visit (INDEPENDENT_AMBULATORY_CARE_PROVIDER_SITE_OTHER): Payer: Medicare Other | Admitting: Family Medicine

## 2014-04-08 DIAGNOSIS — I4891 Unspecified atrial fibrillation: Secondary | ICD-10-CM

## 2014-04-08 DIAGNOSIS — I495 Sick sinus syndrome: Secondary | ICD-10-CM | POA: Diagnosis not present

## 2014-04-08 DIAGNOSIS — Z79899 Other long term (current) drug therapy: Secondary | ICD-10-CM | POA: Diagnosis not present

## 2014-04-08 DIAGNOSIS — Z5181 Encounter for therapeutic drug level monitoring: Secondary | ICD-10-CM | POA: Diagnosis not present

## 2014-04-08 LAB — MAGNESIUM: Magnesium: 1.4 mg/dL — ABNORMAL LOW (ref 1.5–2.5)

## 2014-04-08 LAB — POCT INR: INR: 3.1

## 2014-04-08 LAB — POTASSIUM: Potassium: 3.8 mEq/L (ref 3.5–5.1)

## 2014-04-16 ENCOUNTER — Other Ambulatory Visit: Payer: Self-pay | Admitting: *Deleted

## 2014-04-16 MED ORDER — TRIMETHOPRIM 100 MG PO TABS: 100.0000 mg | ORAL_TABLET | Freq: Every day | ORAL | Status: DC

## 2014-05-06 ENCOUNTER — Other Ambulatory Visit (INDEPENDENT_AMBULATORY_CARE_PROVIDER_SITE_OTHER): Payer: Medicare Other

## 2014-05-06 DIAGNOSIS — I4891 Unspecified atrial fibrillation: Secondary | ICD-10-CM | POA: Diagnosis not present

## 2014-05-06 DIAGNOSIS — Z7901 Long term (current) use of anticoagulants: Secondary | ICD-10-CM

## 2014-05-06 DIAGNOSIS — Z5181 Encounter for therapeutic drug level monitoring: Secondary | ICD-10-CM

## 2014-05-06 LAB — POCT INR: INR: 2.7

## 2014-05-17 DIAGNOSIS — H903 Sensorineural hearing loss, bilateral: Secondary | ICD-10-CM | POA: Diagnosis not present

## 2014-05-17 DIAGNOSIS — H6123 Impacted cerumen, bilateral: Secondary | ICD-10-CM | POA: Diagnosis not present

## 2014-05-24 ENCOUNTER — Encounter: Payer: Self-pay | Admitting: Internal Medicine

## 2014-05-28 DIAGNOSIS — L820 Inflamed seborrheic keratosis: Secondary | ICD-10-CM | POA: Diagnosis not present

## 2014-05-28 DIAGNOSIS — L570 Actinic keratosis: Secondary | ICD-10-CM | POA: Diagnosis not present

## 2014-05-28 DIAGNOSIS — L814 Other melanin hyperpigmentation: Secondary | ICD-10-CM | POA: Diagnosis not present

## 2014-05-28 DIAGNOSIS — L853 Xerosis cutis: Secondary | ICD-10-CM | POA: Diagnosis not present

## 2014-05-28 DIAGNOSIS — L821 Other seborrheic keratosis: Secondary | ICD-10-CM | POA: Diagnosis not present

## 2014-05-28 DIAGNOSIS — L578 Other skin changes due to chronic exposure to nonionizing radiation: Secondary | ICD-10-CM | POA: Diagnosis not present

## 2014-05-28 DIAGNOSIS — L723 Sebaceous cyst: Secondary | ICD-10-CM | POA: Diagnosis not present

## 2014-05-30 ENCOUNTER — Encounter: Payer: Self-pay | Admitting: Cardiovascular Disease

## 2014-05-30 ENCOUNTER — Ambulatory Visit (INDEPENDENT_AMBULATORY_CARE_PROVIDER_SITE_OTHER): Payer: Medicare Other | Admitting: Cardiovascular Disease

## 2014-05-30 VITALS — BP 120/60 | HR 73 | Ht 63.0 in | Wt 125.0 lb

## 2014-05-30 DIAGNOSIS — I10 Essential (primary) hypertension: Secondary | ICD-10-CM | POA: Diagnosis not present

## 2014-05-30 DIAGNOSIS — I4891 Unspecified atrial fibrillation: Secondary | ICD-10-CM | POA: Diagnosis not present

## 2014-05-30 DIAGNOSIS — I5032 Chronic diastolic (congestive) heart failure: Secondary | ICD-10-CM | POA: Diagnosis not present

## 2014-05-30 NOTE — Assessment & Plan Note (Signed)
Blood pressure is well controlled on today's visit. No changes made to the medications. 

## 2014-05-30 NOTE — Patient Instructions (Signed)
You are doing well. No medication changes were made.  We will check your magnesium today  Add more citrus to your diet for potassium  Please call us if you have new issues that need to be addressed before your next appt.  Your physician wants you to follow-up in: 6 months.  You will receive a reminder letter in the mail two months in advance. If you don't receive a letter, please call our office to schedule the follow-up appointment.

## 2014-05-30 NOTE — Assessment & Plan Note (Signed)
She appears relatively euvolemic on today's visit. Currently on HCTZ alone. Suggested she increase her potassium, continue magnesium with lab check today, repeat magnesium level

## 2014-05-30 NOTE — Progress Notes (Signed)
Patient ID: Amanda [name], female    DOB: 1919-05-11, 81 y.o.   MRN: [id number]  HPI Comments: [name] is a pleasant 81 year old woman who  has a history of paroxysmal atrial fibrillation, normal systolic function by echocardiogram in 2007 with  MR, mild aortic valve regurgitation, mildly dilated left atrium, Holter monitor and April 2007 showing frequent and complex supraventricular arrhythmia, cardiac CTA showing no significant coronary artery disease with atherosclerotic plaque in the thoracic aorta who has been managed on sotalol for rhythm control, pacer placed in May 2007 with a history of "fainting "prior to that who presents for routine followup of her atrial fibrillation  In follow-up today, she reports that she is doing well. She is active, no recent falls. She denies having any palpitations concerning for arrhythmia. She is tolerating warfarin Recently seen by Dr. Caryl Comes, lab work showing low magnesium. She continues on 1 magnesium pill per day. She is concerned about the battery on her pacemaker. This suggest 7-30 months remaining, average 20 months She is taking sotalol twice a day Her balance is poor, high fall risk, significant gait instability. Okay if she holds onto a scooter in the shopping store, has to hold onto person's arm when walking outside the house. She is relatively sedentary, does walk  during the daytime, with help. She lives at [facility] [address]. She has had several previous falls  EKG on today's visit shows normal sinus rhythm with rate 73 bpm, nonspecific ST abnormality  Other past medical history Previous echocardiogram for shortness of breath  echo showed mild LVH, diastolic relaxation abnormality, normal ejection fraction, mild to moderate MR, high normal right ventricular systolic pressures Cardiac catheterization in April 2007 showing no significant coronary artery disease.   Allergies  Allergen Reactions  . Amoxicillin Diarrhea    Weakness and atrial fib  .  Augmentin [Amoxicillin-Pot Clavulanate] Diarrhea    Weakness and atrial fib  . Sulfonamide Derivatives     Outpatient Encounter Prescriptions as of 05/30/2014  Medication Sig  . acetaminophen (TYLENOL) 500 MG tablet Take 500 mg by mouth as needed.    . cholecalciferol (VITAMIN D) 1000 UNITS tablet Take 1,000 Units by mouth daily.  . hydrochlorothiazide (HYDRODIURIL) 25 MG tablet Take 1 tablet (25 mg total) by mouth daily.  Marland Kitchen levothyroxine (SYNTHROID, LEVOTHROID) 50 MCG tablet Take 1 tablet (50 mcg total) by mouth daily before breakfast.  . lisinopril (PRINIVIL,ZESTRIL) 20 MG tablet Take 1 tablet (20 mg total) by mouth daily.  . Magnesium 250 MG TABS Take 250 mg by mouth daily.  . mirtazapine (REMERON) 15 MG tablet Take 1 tablet by mouth at   bedtime  . nitroGLYCERIN (NITROSTAT) 0.4 MG SL tablet Place 0.4 mg under the tongue every 5 (five) minutes as needed.    Marland Kitchen omeprazole (PRILOSEC) 20 MG capsule Take 1 capsule (20 mg total) by mouth 2 (two) times daily before a meal.  . sotalol (BETAPACE) 120 MG tablet Take 1/2 tablet by mouth  twice a day  . trimethoprim (TRIMPEX) 100 MG tablet Take 1 tablet (100 mg total) by mouth daily.  Marland Kitchen warfarin (COUMADIN) 4 MG tablet Take 1 to 2 daily as  directed.    Past Medical History  Diagnosis Date  . Atrial fibrillation   . Hyperlipidemia   . Hypertension   . Hypothyroidism     after Rx for thyroid cancer  . Osteoarthritis   . Sleep disturbance   . Urinary incontinence     usually from UTI's  .  Mitral regurgitation   . Depression   . Pacemaker   . GERD (gastroesophageal reflux disease)     Past Surgical History  Procedure Laterality Date  . Tonsillectomy and adenoidectomy  1937  . Vesicovaginal fistula closure w/ tah  1972  . Goiter removed  1976    cancerous. Then got RAI  . Breast lumpectomy  1983  . Breast biopsy  2002  . Pacemaker insertion  5/07    after syncope  . Cataract extraction, bilateral      Social History  reports that  she has never smoked. She has never used smokeless tobacco. She reports that she does not drink alcohol or use illicit drugs.  Family History family history includes Coronary artery disease in her brother; Diabetes in her mother; Prostate cancer in her brother; Stroke in her sister. There is no history of Breast cancer or Colon cancer.  Review of Systems  Constitutional: Negative.   Eyes: Negative.   Respiratory: Negative.   Cardiovascular: Negative.   Gastrointestinal: Negative.   Musculoskeletal: Positive for gait problem.  Skin: Negative.   Neurological: Negative.   Hematological: Negative.   Psychiatric/Behavioral: Negative.   All other systems reviewed and are negative.   BP 120/60 mmHg  Pulse 73  Ht 5\' 3"  (1.6 m)  Wt 125 lb (56.7 kg)  BMI 22.15 kg/m2\  Physical Exam  Constitutional: She is oriented to person, place, and time. She appears well-developed and well-nourished.  HENT:  Head: Normocephalic.  Nose: Nose normal.  Mouth/Throat: Oropharynx is clear and moist.  Eyes: Conjunctivae are normal. Pupils are equal, round, and reactive to light.  Neck: Normal range of motion. Neck supple. No JVD present.  Cardiovascular: Normal rate, regular rhythm, S1 normal, S2 normal, normal heart sounds and intact distal pulses.  Exam reveals no gallop and no friction rub.   No murmur heard. Pulmonary/Chest: Effort normal and breath sounds normal. No respiratory distress. She has no wheezes. She has no rales. She exhibits no tenderness.  Abdominal: Soft. Bowel sounds are normal. She exhibits no distension. There is no tenderness.  Musculoskeletal: Normal range of motion. She exhibits no edema or tenderness.  Lymphadenopathy:    She has no cervical adenopathy.  Neurological: She is alert and oriented to person, place, and time. Coordination normal.  Skin: Skin is warm and dry. No rash noted. No erythema.  Psychiatric: She has a normal mood and affect. Her behavior is normal. Judgment  and thought content normal.    Assessment and Plan  Nursing note and vitals reviewed.

## 2014-05-30 NOTE — Assessment & Plan Note (Signed)
Maintaining normal sinus rhythm on sotalol twice a day, tolerating anticoagulation. No medication changes made

## 2014-05-31 LAB — MAGNESIUM: MAGNESIUM: 1.8 mg/dL (ref 1.6–2.3)

## 2014-06-05 ENCOUNTER — Other Ambulatory Visit (INDEPENDENT_AMBULATORY_CARE_PROVIDER_SITE_OTHER): Payer: Medicare Other

## 2014-06-05 ENCOUNTER — Other Ambulatory Visit: Payer: PRIVATE HEALTH INSURANCE | Admitting: Internal Medicine

## 2014-06-05 DIAGNOSIS — Z5181 Encounter for therapeutic drug level monitoring: Secondary | ICD-10-CM | POA: Diagnosis not present

## 2014-06-05 DIAGNOSIS — I4891 Unspecified atrial fibrillation: Secondary | ICD-10-CM | POA: Diagnosis not present

## 2014-06-05 DIAGNOSIS — Z7901 Long term (current) use of anticoagulants: Secondary | ICD-10-CM | POA: Diagnosis not present

## 2014-06-05 LAB — POCT INR: INR: 1.8

## 2014-06-11 DIAGNOSIS — H0014 Chalazion left upper eyelid: Secondary | ICD-10-CM | POA: Diagnosis not present

## 2014-06-20 ENCOUNTER — Other Ambulatory Visit: Payer: Self-pay | Admitting: Internal Medicine

## 2014-06-25 ENCOUNTER — Ambulatory Visit (INDEPENDENT_AMBULATORY_CARE_PROVIDER_SITE_OTHER): Payer: Medicare Other | Admitting: *Deleted

## 2014-06-25 DIAGNOSIS — I495 Sick sinus syndrome: Secondary | ICD-10-CM

## 2014-06-25 NOTE — Progress Notes (Signed)
Remote pacemaker transmission.   

## 2014-06-28 LAB — MDC_IDC_ENUM_SESS_TYPE_REMOTE
Battery Remaining Longevity: 15 mo
Battery Voltage: 2.69 V
Brady Statistic AP VP Percent: 0 %
Brady Statistic AS VP Percent: 0 %
Date Time Interrogation Session: 20160308165555
Lead Channel Impedance Value: 813 Ohm
Lead Channel Pacing Threshold Amplitude: 0.625 V
Lead Channel Pacing Threshold Amplitude: 0.875 V
Lead Channel Pacing Threshold Pulse Width: 0.4 ms
Lead Channel Sensing Intrinsic Amplitude: 5.6 mV
Lead Channel Setting Pacing Pulse Width: 0.4 ms
Lead Channel Setting Sensing Sensitivity: 2 mV
MDC IDC MSMT BATTERY IMPEDANCE: 3197 Ohm
MDC IDC MSMT LEADCHNL RV IMPEDANCE VALUE: 441 Ohm
MDC IDC MSMT LEADCHNL RV PACING THRESHOLD PULSEWIDTH: 0.4 ms
MDC IDC SET LEADCHNL RA PACING AMPLITUDE: 2 V
MDC IDC SET LEADCHNL RV PACING AMPLITUDE: 2.5 V
MDC IDC STAT BRADY AP VS PERCENT: 99 %
MDC IDC STAT BRADY AS VS PERCENT: 1 %

## 2014-07-03 ENCOUNTER — Other Ambulatory Visit (INDEPENDENT_AMBULATORY_CARE_PROVIDER_SITE_OTHER): Payer: Medicare Other

## 2014-07-03 DIAGNOSIS — I4891 Unspecified atrial fibrillation: Secondary | ICD-10-CM

## 2014-07-03 DIAGNOSIS — Z5181 Encounter for therapeutic drug level monitoring: Secondary | ICD-10-CM

## 2014-07-03 DIAGNOSIS — Z7901 Long term (current) use of anticoagulants: Secondary | ICD-10-CM | POA: Diagnosis not present

## 2014-07-03 LAB — POCT INR: INR: 3.9

## 2014-07-04 ENCOUNTER — Encounter: Payer: Self-pay | Admitting: Cardiology

## 2014-07-10 ENCOUNTER — Encounter: Payer: Self-pay | Admitting: Internal Medicine

## 2014-07-16 ENCOUNTER — Encounter: Payer: Self-pay | Admitting: Internal Medicine

## 2014-07-16 ENCOUNTER — Ambulatory Visit (INDEPENDENT_AMBULATORY_CARE_PROVIDER_SITE_OTHER): Payer: Medicare Other | Admitting: Internal Medicine

## 2014-07-16 VITALS — BP 138/70 | HR 53 | Temp 98.0°F | Wt 125.0 lb

## 2014-07-16 DIAGNOSIS — N189 Chronic kidney disease, unspecified: Secondary | ICD-10-CM | POA: Diagnosis not present

## 2014-07-16 DIAGNOSIS — E039 Hypothyroidism, unspecified: Secondary | ICD-10-CM

## 2014-07-16 DIAGNOSIS — I5032 Chronic diastolic (congestive) heart failure: Secondary | ICD-10-CM | POA: Diagnosis not present

## 2014-07-16 DIAGNOSIS — F39 Unspecified mood [affective] disorder: Secondary | ICD-10-CM

## 2014-07-16 DIAGNOSIS — I700 Atherosclerosis of aorta: Secondary | ICD-10-CM

## 2014-07-16 DIAGNOSIS — N183 Chronic kidney disease, stage 3 unspecified: Secondary | ICD-10-CM

## 2014-07-16 DIAGNOSIS — I480 Paroxysmal atrial fibrillation: Secondary | ICD-10-CM | POA: Diagnosis not present

## 2014-07-16 LAB — CBC WITH DIFFERENTIAL/PLATELET
Basophils Absolute: 0 10*3/uL (ref 0.0–0.1)
Basophils Relative: 0.9 % (ref 0.0–3.0)
EOS PCT: 3.8 % (ref 0.0–5.0)
Eosinophils Absolute: 0.2 10*3/uL (ref 0.0–0.7)
HCT: 29.1 % — ABNORMAL LOW (ref 36.0–46.0)
Hemoglobin: 9.7 g/dL — ABNORMAL LOW (ref 12.0–15.0)
LYMPHS ABS: 1.5 10*3/uL (ref 0.7–4.0)
Lymphocytes Relative: 28 % (ref 12.0–46.0)
MCHC: 33.4 g/dL (ref 30.0–36.0)
MCV: 85.6 fl (ref 78.0–100.0)
MONO ABS: 0.8 10*3/uL (ref 0.1–1.0)
Monocytes Relative: 13.9 % — ABNORMAL HIGH (ref 3.0–12.0)
Neutro Abs: 2.9 10*3/uL (ref 1.4–7.7)
Neutrophils Relative %: 53.4 % (ref 43.0–77.0)
PLATELETS: 227 10*3/uL (ref 150.0–400.0)
RBC: 3.39 Mil/uL — ABNORMAL LOW (ref 3.87–5.11)
RDW: 15.9 % — ABNORMAL HIGH (ref 11.5–15.5)
WBC: 5.4 10*3/uL (ref 4.0–10.5)

## 2014-07-16 LAB — COMPREHENSIVE METABOLIC PANEL
ALK PHOS: 64 U/L (ref 39–117)
ALT: 8 U/L (ref 0–35)
AST: 21 U/L (ref 0–37)
Albumin: 3.8 g/dL (ref 3.5–5.2)
BUN: 32 mg/dL — AB (ref 6–23)
CALCIUM: 9.4 mg/dL (ref 8.4–10.5)
CO2: 29 mEq/L (ref 19–32)
CREATININE: 1.78 mg/dL — AB (ref 0.40–1.20)
Chloride: 104 mEq/L (ref 96–112)
GFR: 28.31 mL/min — ABNORMAL LOW (ref >60.00)
GLUCOSE: 103 mg/dL — AB (ref 70–99)
POTASSIUM: 3.8 meq/L (ref 3.5–5.1)
Sodium: 140 mEq/L (ref 135–145)
Total Bilirubin: 0.3 mg/dL (ref 0.2–1.2)
Total Protein: 7.3 g/dL (ref 6.0–8.3)

## 2014-07-16 LAB — TSH: TSH: 4.26 u[IU]/mL (ref 0.35–4.50)

## 2014-07-16 LAB — T4, FREE: Free T4: 0.92 ng/dL (ref 0.60–1.60)

## 2014-07-16 MED ORDER — SOTALOL HCL 120 MG PO TABS: 60.0000 mg | ORAL_TABLET | Freq: Two times a day (BID) | ORAL | Status: DC

## 2014-07-16 MED ORDER — LISINOPRIL 20 MG PO TABS: 20.0000 mg | ORAL_TABLET | Freq: Every day | ORAL | Status: DC

## 2014-07-16 NOTE — Assessment & Plan Note (Signed)
More mood issues lately Did stop mirtazapine on her own--but has restarted

## 2014-07-16 NOTE — Assessment & Plan Note (Signed)
Seems euthyroid  Due for labs

## 2014-07-16 NOTE — Assessment & Plan Note (Signed)
Due for labs Is on ACEI

## 2014-07-16 NOTE — Addendum Note (Signed)
Addended by: Despina Hidden on: 07/16/2014 10:47 AM   Modules accepted: Orders

## 2014-07-16 NOTE — Progress Notes (Signed)
Pre visit review using our clinic review tool, if applicable. No additional management support is needed unless otherwise documented below in the visit note. 

## 2014-07-16 NOTE — Assessment & Plan Note (Signed)
Neutral fluid status No changes needed 

## 2014-07-16 NOTE — Assessment & Plan Note (Signed)
BP is okay No statin due to age, etc

## 2014-07-16 NOTE — Progress Notes (Signed)
Subjective:    Patient ID: Amanda [name], female    DOB: 06-May-1919, 81 y.o.   MRN: [id number]  HPI Here with daughter Amanda [name] for follow up of multiple medical issues  Doing some better Has only had a recent slight spell of atrial fib--feels she gets it if she has GI gas 2 nights ago--may have lasted 20-30 minutes---and then has recovery time Overall not too bad now  No chest pain No SOB No dizziness or syncope Chronic edema-- seems about the same over the past few years (mostly noticeable in the evenings)  Does take omeprazole and antacids No regular acid symptoms or water brash on this regimen No swallowing problems  No change in poor energy levels--or may tire a little easier No skin or hair changes Weight is stable  Mood is okay Has restarted mirtazapine mostly for sleep---was worse off it recently  Current Outpatient Prescriptions on File Prior to Visit  Medication Sig Dispense Refill  . acetaminophen (TYLENOL) 500 MG tablet Take 500 mg by mouth as needed.      . cholecalciferol (VITAMIN D) 1000 UNITS tablet Take 1,000 Units by mouth daily.    . hydrochlorothiazide (HYDRODIURIL) 25 MG tablet Take 1 tablet by mouth  daily 90 tablet 3  . levothyroxine (SYNTHROID, LEVOTHROID) 50 MCG tablet Take 1 tablet (50 mcg total) by mouth daily before breakfast. 14 tablet 1  . lisinopril (PRINIVIL,ZESTRIL) 20 MG tablet Take 1 tablet (20 mg total) by mouth daily. 90 tablet 3  . Magnesium 250 MG TABS Take 250 mg by mouth daily.    . mirtazapine (REMERON) 15 MG tablet Take 1 tablet by mouth at  bedtime 90 tablet 3  . nitroGLYCERIN (NITROSTAT) 0.4 MG SL tablet Place 0.4 mg under the tongue every 5 (five) minutes as needed.      Marland Kitchen omeprazole (PRILOSEC) 20 MG capsule Take 1 capsule (20 mg total) by mouth 2 (two) times daily before a meal. 180 capsule 3  . sotalol (BETAPACE) 120 MG tablet Take 1/2 tablet by mouth  twice a day    . trimethoprim (TRIMPEX) 100 MG tablet Take 1 tablet (100 mg  total) by mouth daily. 90 tablet 3  . warfarin (COUMADIN) 4 MG tablet Take 1 to 2 tablets by  mouth daily as directed. 180 tablet 3   No current facility-administered medications on file prior to visit.    Allergies  Allergen Reactions  . Amoxicillin Diarrhea    Weakness and atrial fib  . Augmentin [Amoxicillin-Pot Clavulanate] Diarrhea    Weakness and atrial fib  . Sulfonamide Derivatives     Past Medical History  Diagnosis Date  . Atrial fibrillation   . Hyperlipidemia   . Hypertension   . Hypothyroidism     after Rx for thyroid cancer  . Osteoarthritis   . Sleep disturbance   . Urinary incontinence     usually from UTI's  . Mitral regurgitation   . Depression   . Pacemaker   . GERD (gastroesophageal reflux disease)     Past Surgical History  Procedure Laterality Date  . Tonsillectomy and adenoidectomy  1937  . Vesicovaginal fistula closure w/ tah  1972  . Goiter removed  1976    cancerous. Then got RAI  . Breast lumpectomy  1983  . Breast biopsy  2002  . Pacemaker insertion  5/07    after syncope  . Cataract extraction, bilateral      Family History  Problem Relation Age of Onset  .  Diabetes Mother   . Stroke Sister   . Coronary artery disease Brother   . Prostate cancer Brother   . Breast cancer Neg Hx   . Colon cancer Neg Hx     History   Social History  . Marital Status: Widowed    Spouse Name: N/A  . Number of Children: 2  . Years of Education: N/A   Occupational History  . homemaker    Social History Main Topics  . Smoking status: Never Smoker   . Smokeless tobacco: Never Used  . Alcohol Use: No  . Drug Use: No  . Sexual Activity: Not on file   Other Topics Concern  . Not on file   Social History Narrative   Widowed 2002. 1 local daughter. 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    Daughter Amanda [name]) is health care POA.    Discussed DNR and she requests    Would not want tube feeds if  cognitively unaware   Review of Systems Voice is "not good" Still doesn't sleep well--intermittent and not new. Mirtazapine helps but she ran out recently--just restarted Weight is stable--appetite is really not that good Now taking magnesium supplement     Objective:   Physical Exam  Constitutional: She appears well-developed. No distress.  Neck: Normal range of motion. Neck supple.  Cardiovascular: Normal rate, regular rhythm and normal heart sounds.  Exam reveals no gallop.   No murmur heard. Rate now about 60  Pulmonary/Chest: Effort normal and breath sounds normal. No respiratory distress. She has no wheezes. She has no rales.  Abdominal: Soft. There is no tenderness.  Musculoskeletal: She exhibits no edema or tenderness.  Lymphadenopathy:    She has no cervical adenopathy.  Psychiatric:  No overt depression but mild psychomotor retardation          Assessment & Plan:

## 2014-07-16 NOTE — Assessment & Plan Note (Signed)
Fewer paroxysms recently Okay on sotalol Coumadin still

## 2014-07-31 ENCOUNTER — Other Ambulatory Visit (INDEPENDENT_AMBULATORY_CARE_PROVIDER_SITE_OTHER): Payer: Medicare Other

## 2014-07-31 ENCOUNTER — Telehealth: Payer: Self-pay | Admitting: *Deleted

## 2014-07-31 DIAGNOSIS — Z7901 Long term (current) use of anticoagulants: Secondary | ICD-10-CM

## 2014-07-31 DIAGNOSIS — Z5181 Encounter for therapeutic drug level monitoring: Secondary | ICD-10-CM | POA: Diagnosis not present

## 2014-07-31 DIAGNOSIS — I4891 Unspecified atrial fibrillation: Secondary | ICD-10-CM | POA: Diagnosis not present

## 2014-07-31 LAB — POCT INR: INR: 2.3

## 2014-07-31 NOTE — Telephone Encounter (Signed)
Spoke with Aliene Beams (patient's mother).  Informed of instructions.  Recheck scheduled for 05/17.

## 2014-07-31 NOTE — Telephone Encounter (Signed)
-----   Message from Venia Carbon, MD sent at 07/31/2014  1:45 PM EDT ----- Please have her continue the current dosing and set up recheck in about 1 month

## 2014-09-03 ENCOUNTER — Other Ambulatory Visit (INDEPENDENT_AMBULATORY_CARE_PROVIDER_SITE_OTHER): Payer: Medicare Other

## 2014-09-03 DIAGNOSIS — I4891 Unspecified atrial fibrillation: Secondary | ICD-10-CM

## 2014-09-03 DIAGNOSIS — Z5181 Encounter for therapeutic drug level monitoring: Secondary | ICD-10-CM

## 2014-09-03 DIAGNOSIS — Z7901 Long term (current) use of anticoagulants: Secondary | ICD-10-CM | POA: Diagnosis not present

## 2014-09-03 LAB — POCT INR: INR: 1.8

## 2014-09-26 ENCOUNTER — Ambulatory Visit (INDEPENDENT_AMBULATORY_CARE_PROVIDER_SITE_OTHER): Payer: Medicare Other | Admitting: *Deleted

## 2014-09-26 ENCOUNTER — Encounter: Payer: Self-pay | Admitting: Internal Medicine

## 2014-09-26 DIAGNOSIS — I495 Sick sinus syndrome: Secondary | ICD-10-CM | POA: Diagnosis not present

## 2014-09-26 NOTE — Progress Notes (Signed)
Remote pacemaker transmission.   

## 2014-10-02 LAB — CUP PACEART REMOTE DEVICE CHECK
Battery Impedance: 3510 Ohm
Battery Voltage: 2.68 V
Brady Statistic AP VP Percent: 0 %
Brady Statistic AS VP Percent: 0 %
Brady Statistic AS VS Percent: 1 %
Date Time Interrogation Session: 20160609132702
Lead Channel Impedance Value: 808 Ohm
Lead Channel Pacing Threshold Amplitude: 0.75 V
Lead Channel Pacing Threshold Pulse Width: 0.4 ms
Lead Channel Pacing Threshold Pulse Width: 0.4 ms
Lead Channel Sensing Intrinsic Amplitude: 5.6 mV
Lead Channel Setting Pacing Amplitude: 2.5 V
Lead Channel Setting Pacing Pulse Width: 0.4 ms
Lead Channel Setting Sensing Sensitivity: 2 mV
MDC IDC MSMT BATTERY REMAINING LONGEVITY: 13 mo
MDC IDC MSMT LEADCHNL RV IMPEDANCE VALUE: 454 Ohm
MDC IDC MSMT LEADCHNL RV PACING THRESHOLD AMPLITUDE: 1 V
MDC IDC SET LEADCHNL RA PACING AMPLITUDE: 2 V
MDC IDC STAT BRADY AP VS PERCENT: 98 %

## 2014-10-09 ENCOUNTER — Other Ambulatory Visit: Payer: Medicare Other

## 2014-10-10 ENCOUNTER — Ambulatory Visit (INDEPENDENT_AMBULATORY_CARE_PROVIDER_SITE_OTHER): Payer: Medicare Other | Admitting: *Deleted

## 2014-10-10 DIAGNOSIS — Z5181 Encounter for therapeutic drug level monitoring: Secondary | ICD-10-CM | POA: Diagnosis not present

## 2014-10-10 LAB — POCT INR: INR: 1.7

## 2014-10-10 NOTE — Progress Notes (Signed)
Pre visit review using our clinic review tool, if applicable. No additional management support is needed unless otherwise documented below in the visit note. 

## 2014-10-16 ENCOUNTER — Encounter: Payer: Self-pay | Admitting: Internal Medicine

## 2014-10-16 ENCOUNTER — Ambulatory Visit (INDEPENDENT_AMBULATORY_CARE_PROVIDER_SITE_OTHER): Payer: Medicare Other | Admitting: Internal Medicine

## 2014-10-16 ENCOUNTER — Encounter: Payer: Self-pay | Admitting: Cardiology

## 2014-10-16 VITALS — BP 122/70 | HR 83 | Temp 98.3°F | Resp 18 | Wt 124.0 lb

## 2014-10-16 DIAGNOSIS — J029 Acute pharyngitis, unspecified: Secondary | ICD-10-CM | POA: Insufficient documentation

## 2014-10-16 MED ORDER — TRIAMCINOLONE ACETONIDE 0.1 % EX CREA: 1.0000 "application " | TOPICAL_CREAM | Freq: Two times a day (BID) | CUTANEOUS | Status: DC | PRN

## 2014-10-16 NOTE — Assessment & Plan Note (Signed)
Probably self limited viral infection She was worried about pneumonia---told her nothing to suggest that  Discussed supportive care TAC for the non specific rash

## 2014-10-16 NOTE — Progress Notes (Signed)
Pre visit review using our clinic review tool, if applicable. No additional management support is needed unless otherwise documented below in the visit note. 

## 2014-10-16 NOTE — Progress Notes (Signed)
Subjective:    Patient ID: Amanda [name], female    DOB: 11/29/1919, 81 y.o.   MRN: [id number]  HPI Having throat and chest Here with daughter  Started with drainage from head into throat--started 1-2 days ago Felt cold at start Soreness in lower substernal area Needed to burp but not able to--but no heartburn (may have been due to some pepsi before bedtime) Slight cough No SOB Felt hot last night--didn't check temp No sweats but chills  Some right ear pain--and around head No trouble swallowing-- some pain with swallowing last night though Tried some zycam-- 4 or 5 of them Hasn't tried any analgesics  Current Outpatient Prescriptions on File Prior to Visit  Medication Sig Dispense Refill  . acetaminophen (TYLENOL) 500 MG tablet Take 500 mg by mouth as needed.      . cholecalciferol (VITAMIN D) 1000 UNITS tablet Take 1,000 Units by mouth daily.    . hydrochlorothiazide (HYDRODIURIL) 25 MG tablet Take 1 tablet by mouth  daily 90 tablet 3  . levothyroxine (SYNTHROID, LEVOTHROID) 50 MCG tablet Take 1 tablet (50 mcg total) by mouth daily before breakfast. 14 tablet 1  . lisinopril (PRINIVIL,ZESTRIL) 20 MG tablet Take 1 tablet (20 mg total) by mouth daily. 90 tablet 3  . Magnesium 250 MG TABS Take 250 mg by mouth daily.    . mirtazapine (REMERON) 15 MG tablet Take 1 tablet by mouth at  bedtime 90 tablet 3  . nitroGLYCERIN (NITROSTAT) 0.4 MG SL tablet Place 0.4 mg under the tongue every 5 (five) minutes as needed.      Marland Kitchen omeprazole (PRILOSEC) 20 MG capsule Take 1 capsule (20 mg total) by mouth 2 (two) times daily before a meal. 180 capsule 3  . sotalol (BETAPACE) 120 MG tablet Take 0.5 tablets (60 mg total) by mouth 2 (two) times daily. 90 tablet 3  . trimethoprim (TRIMPEX) 100 MG tablet Take 1 tablet (100 mg total) by mouth daily. 90 tablet 3  . warfarin (COUMADIN) 4 MG tablet Take 1 to 2 tablets by  mouth daily as directed. 180 tablet 3   No current facility-administered medications  on file prior to visit.    Allergies  Allergen Reactions  . Amoxicillin Diarrhea    Weakness and atrial fib  . Augmentin [Amoxicillin-Pot Clavulanate] Diarrhea    Weakness and atrial fib  . Sulfonamide Derivatives     Past Medical History  Diagnosis Date  . Atrial fibrillation   . Hyperlipidemia   . Hypertension   . Hypothyroidism     after Rx for thyroid cancer  . Osteoarthritis   . Sleep disturbance   . Urinary incontinence     usually from UTI's  . Mitral regurgitation   . Depression   . Pacemaker   . GERD (gastroesophageal reflux disease)     Past Surgical History  Procedure Laterality Date  . Tonsillectomy and adenoidectomy  1937  . Vesicovaginal fistula closure w/ tah  1972  . Goiter removed  1976    cancerous. Then got RAI  . Breast lumpectomy  1983  . Breast biopsy  2002  . Pacemaker insertion  5/07    after syncope  . Cataract extraction, bilateral      Family History  Problem Relation Age of Onset  . Diabetes Mother   . Stroke Sister   . Coronary artery disease Brother   . Prostate cancer Brother   . Breast cancer Neg Hx   . Colon cancer Neg Hx  History   Social History  . Marital Status: Widowed    Spouse Name: N/A  . Number of Children: 2  . Years of Education: N/A   Occupational History  . homemaker    Social History Main Topics  . Smoking status: Never Smoker   . Smokeless tobacco: Never Used  . Alcohol Use: No  . Drug Use: No  . Sexual Activity: Not on file   Other Topics Concern  . Not on file   Social History Narrative   Widowed 2002. 1 local daughter. Other in [place]. Was homemaker. Farmed and gardened. 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    Daughter [name]) is health care POA.    Discussed DNR and she requests    Would not want tube feeds if cognitively unaware   Review of Systems No rash but increased redness in upper mid chest-- not new but is more prominent No vomiting or diarrhea Appetite off a little    Objective:     Physical Exam  Constitutional: She appears well-developed. No distress.  HENT:  Mouth/Throat: Oropharynx is clear and moist. No oropharyngeal exudate.  Mild nasal inflammation  Neck: Normal range of motion. Neck supple. No tracheal deviation present. No thyromegaly present.  Pulmonary/Chest: Effort normal and breath sounds normal. No respiratory distress. She has no wheezes. She has no rales.  Skin:  Non specific rash at top of sternum          Assessment & Plan:

## 2014-10-24 ENCOUNTER — Ambulatory Visit (INDEPENDENT_AMBULATORY_CARE_PROVIDER_SITE_OTHER): Payer: Medicare Other | Admitting: *Deleted

## 2014-10-24 DIAGNOSIS — Z5181 Encounter for therapeutic drug level monitoring: Secondary | ICD-10-CM | POA: Diagnosis not present

## 2014-10-24 LAB — POCT INR: INR: 1.8

## 2014-10-24 NOTE — Progress Notes (Signed)
Pre visit review using our clinic review tool, if applicable. No additional management support is needed unless otherwise documented below in the visit note. 

## 2014-10-28 DIAGNOSIS — H35351 Cystoid macular degeneration, right eye: Secondary | ICD-10-CM | POA: Diagnosis not present

## 2014-11-11 ENCOUNTER — Ambulatory Visit (INDEPENDENT_AMBULATORY_CARE_PROVIDER_SITE_OTHER): Payer: Medicare Other | Admitting: *Deleted

## 2014-11-11 DIAGNOSIS — Z5181 Encounter for therapeutic drug level monitoring: Secondary | ICD-10-CM | POA: Diagnosis not present

## 2014-11-11 LAB — POCT INR: INR: 4.2

## 2014-11-11 NOTE — Progress Notes (Signed)
Pre visit review using our clinic review tool, if applicable. No additional management support is needed unless otherwise documented below in the visit note. 

## 2014-11-15 DIAGNOSIS — H903 Sensorineural hearing loss, bilateral: Secondary | ICD-10-CM | POA: Diagnosis not present

## 2014-11-15 DIAGNOSIS — J310 Chronic rhinitis: Secondary | ICD-10-CM | POA: Diagnosis not present

## 2014-11-15 DIAGNOSIS — H6123 Impacted cerumen, bilateral: Secondary | ICD-10-CM | POA: Diagnosis not present

## 2014-11-18 DIAGNOSIS — H34833 Tributary (branch) retinal vein occlusion, bilateral: Secondary | ICD-10-CM | POA: Diagnosis not present

## 2014-12-05 ENCOUNTER — Ambulatory Visit: Payer: Medicare Other

## 2014-12-09 ENCOUNTER — Ambulatory Visit (INDEPENDENT_AMBULATORY_CARE_PROVIDER_SITE_OTHER): Payer: Medicare Other | Admitting: *Deleted

## 2014-12-09 DIAGNOSIS — Z5181 Encounter for therapeutic drug level monitoring: Secondary | ICD-10-CM

## 2014-12-09 LAB — POCT INR: INR: 3.5

## 2014-12-09 NOTE — Progress Notes (Signed)
Pre visit review using our clinic review tool, if applicable. No additional management support is needed unless otherwise documented below in the visit note. 

## 2014-12-26 ENCOUNTER — Ambulatory Visit (INDEPENDENT_AMBULATORY_CARE_PROVIDER_SITE_OTHER): Payer: Medicare Other | Admitting: *Deleted

## 2014-12-26 DIAGNOSIS — Z5181 Encounter for therapeutic drug level monitoring: Secondary | ICD-10-CM | POA: Diagnosis not present

## 2014-12-26 LAB — POCT INR: INR: 3.4

## 2014-12-26 NOTE — Progress Notes (Signed)
Pre visit review using our clinic review tool, if applicable. No additional management support is needed unless otherwise documented below in the visit note. 

## 2014-12-30 ENCOUNTER — Encounter: Payer: Self-pay | Admitting: Internal Medicine

## 2014-12-30 ENCOUNTER — Ambulatory Visit (INDEPENDENT_AMBULATORY_CARE_PROVIDER_SITE_OTHER): Payer: Medicare Other | Admitting: *Deleted

## 2014-12-30 DIAGNOSIS — I495 Sick sinus syndrome: Secondary | ICD-10-CM

## 2014-12-30 NOTE — Progress Notes (Signed)
Remote pacemaker transmission.   

## 2015-01-09 ENCOUNTER — Ambulatory Visit (INDEPENDENT_AMBULATORY_CARE_PROVIDER_SITE_OTHER): Payer: Medicare Other | Admitting: *Deleted

## 2015-01-09 DIAGNOSIS — Z5181 Encounter for therapeutic drug level monitoring: Secondary | ICD-10-CM

## 2015-01-09 LAB — CUP PACEART REMOTE DEVICE CHECK
Battery Impedance: 4091 Ohm
Battery Remaining Longevity: 9 mo
Brady Statistic AP VP Percent: 3 %
Brady Statistic AP VS Percent: 96 %
Brady Statistic AS VS Percent: 1 %
Date Time Interrogation Session: 20160912134220
Implantable Lead Implant Date: 20070524
Implantable Lead Location: 753859
Implantable Lead Model: 5568
Lead Channel Impedance Value: 438 Ohm
Lead Channel Impedance Value: 828 Ohm
Lead Channel Pacing Threshold Amplitude: 0.75 V
Lead Channel Pacing Threshold Pulse Width: 0.4 ms
Lead Channel Sensing Intrinsic Amplitude: 5.6 mV — CL
Lead Channel Setting Pacing Pulse Width: 0.4 ms
Lead Channel Setting Sensing Sensitivity: 2 mV
MDC IDC LEAD IMPLANT DT: 20070524
MDC IDC LEAD LOCATION: 753860
MDC IDC MSMT BATTERY VOLTAGE: 2.67 V
MDC IDC MSMT LEADCHNL RV PACING THRESHOLD AMPLITUDE: 0.875 V
MDC IDC MSMT LEADCHNL RV PACING THRESHOLD PULSEWIDTH: 0.4 ms
MDC IDC SET LEADCHNL RA PACING AMPLITUDE: 2 V
MDC IDC SET LEADCHNL RV PACING AMPLITUDE: 2.5 V
MDC IDC STAT BRADY AS VP PERCENT: 0 %

## 2015-01-09 LAB — POCT INR: INR: 1.8

## 2015-01-09 NOTE — Progress Notes (Signed)
Pre visit review using our clinic review tool, if applicable. No additional management support is needed unless otherwise documented below in the visit note. 

## 2015-01-24 ENCOUNTER — Ambulatory Visit: Payer: Medicare Other | Admitting: Cardiovascular Disease

## 2015-01-30 ENCOUNTER — Encounter: Payer: Self-pay | Admitting: Cardiology

## 2015-02-03 ENCOUNTER — Ambulatory Visit (INDEPENDENT_AMBULATORY_CARE_PROVIDER_SITE_OTHER): Payer: Medicare Other | Admitting: *Deleted

## 2015-02-03 DIAGNOSIS — Z5181 Encounter for therapeutic drug level monitoring: Secondary | ICD-10-CM

## 2015-02-03 LAB — POCT INR: INR: 1.8

## 2015-02-03 MED ORDER — WARFARIN SODIUM 1 MG PO TABS: ORAL_TABLET | ORAL | Status: DC

## 2015-02-03 NOTE — Progress Notes (Signed)
Pre visit review using our clinic review tool, if applicable. No additional management support is needed unless otherwise documented below in the visit note. 

## 2015-02-05 ENCOUNTER — Encounter: Payer: Self-pay | Admitting: Internal Medicine

## 2015-02-05 ENCOUNTER — Telehealth: Payer: Self-pay | Admitting: Internal Medicine

## 2015-02-05 ENCOUNTER — Ambulatory Visit (INDEPENDENT_AMBULATORY_CARE_PROVIDER_SITE_OTHER): Payer: Medicare Other | Admitting: Internal Medicine

## 2015-02-05 VITALS — BP 138/70 | HR 72 | Temp 97.6°F | Ht 63.0 in | Wt 124.0 lb

## 2015-02-05 DIAGNOSIS — Z0000 Encounter for general adult medical examination without abnormal findings: Secondary | ICD-10-CM | POA: Diagnosis not present

## 2015-02-05 DIAGNOSIS — I5032 Chronic diastolic (congestive) heart failure: Secondary | ICD-10-CM | POA: Diagnosis not present

## 2015-02-05 DIAGNOSIS — I495 Sick sinus syndrome: Secondary | ICD-10-CM | POA: Diagnosis not present

## 2015-02-05 DIAGNOSIS — F39 Unspecified mood [affective] disorder: Secondary | ICD-10-CM | POA: Diagnosis not present

## 2015-02-05 DIAGNOSIS — I480 Paroxysmal atrial fibrillation: Secondary | ICD-10-CM

## 2015-02-05 DIAGNOSIS — N184 Chronic kidney disease, stage 4 (severe): Secondary | ICD-10-CM | POA: Diagnosis not present

## 2015-02-05 DIAGNOSIS — I700 Atherosclerosis of aorta: Secondary | ICD-10-CM

## 2015-02-05 DIAGNOSIS — Z23 Encounter for immunization: Secondary | ICD-10-CM | POA: Diagnosis not present

## 2015-02-05 DIAGNOSIS — D631 Anemia in chronic kidney disease: Secondary | ICD-10-CM

## 2015-02-05 LAB — RENAL FUNCTION PANEL
ALBUMIN: 4.1 g/dL (ref 3.5–5.2)
BUN: 37 mg/dL — AB (ref 6–23)
CALCIUM: 9.8 mg/dL (ref 8.4–10.5)
CHLORIDE: 102 meq/L (ref 96–112)
CO2: 29 meq/L (ref 19–32)
Creatinine, Ser: 1.68 mg/dL — ABNORMAL HIGH (ref 0.40–1.20)
GFR: 30.23 mL/min — ABNORMAL LOW (ref >60.00)
Glucose, Bld: 90 mg/dL (ref 70–99)
Phosphorus: 3.1 mg/dL (ref 2.3–4.6)
Potassium: 4 mEq/L (ref 3.5–5.1)
Sodium: 140 mEq/L (ref 135–145)

## 2015-02-05 LAB — CBC WITH DIFFERENTIAL/PLATELET
Basophils Absolute: 0.1 10*3/uL (ref 0.0–0.1)
Basophils Relative: 0.9 % (ref 0.0–3.0)
EOS ABS: 0.3 10*3/uL (ref 0.0–0.7)
Eosinophils Relative: 5.1 % — ABNORMAL HIGH (ref 0.0–5.0)
HCT: 31.6 % — ABNORMAL LOW (ref 36.0–46.0)
HEMOGLOBIN: 10.3 g/dL — AB (ref 12.0–15.0)
Lymphocytes Relative: 25 % (ref 12.0–46.0)
Lymphs Abs: 1.7 10*3/uL (ref 0.7–4.0)
MCHC: 32.5 g/dL (ref 30.0–36.0)
MCV: 90.2 fl (ref 78.0–100.0)
MONO ABS: 0.7 10*3/uL (ref 0.1–1.0)
Monocytes Relative: 10.6 % (ref 3.0–12.0)
Neutro Abs: 4 10*3/uL (ref 1.4–7.7)
Neutrophils Relative %: 58.4 % (ref 43.0–77.0)
Platelets: 235 10*3/uL (ref 150.0–400.0)
RBC: 3.5 Mil/uL — AB (ref 3.87–5.11)
RDW: 15.7 % — AB (ref 11.5–15.5)
WBC: 6.8 10*3/uL (ref 4.0–10.5)

## 2015-02-05 NOTE — Progress Notes (Signed)
Pre visit review using our clinic review tool, if applicable. No additional management support is needed unless otherwise documented below in the visit note. 

## 2015-02-05 NOTE — Assessment & Plan Note (Signed)
No apparent symptoms No action unless major decrease

## 2015-02-05 NOTE — Assessment & Plan Note (Signed)
No claudication Will no use statins due to limited evidence for efficacy at her age

## 2015-02-05 NOTE — Assessment & Plan Note (Signed)
Compensated No edema or change in exercise tolerance No med changes needed

## 2015-02-05 NOTE — Assessment & Plan Note (Signed)
On ACEI Will recheck labs

## 2015-02-05 NOTE — Assessment & Plan Note (Signed)
Mood fair Still with sleep problems so will continue the mirtazapine

## 2015-02-05 NOTE — Telephone Encounter (Signed)
[name]  (pt daughter) called she has questions about ms [place] kidney failure Please call

## 2015-02-05 NOTE — Assessment & Plan Note (Signed)
Paced now Still with episodic palpitations--likely the a fib

## 2015-02-05 NOTE — Addendum Note (Signed)
Addended by: Despina Hidden on: 02/05/2015 09:20 AM   Modules accepted: Orders

## 2015-02-05 NOTE — Progress Notes (Signed)
Subjective:    Patient ID: Amanda [name], female    DOB: 1919/05/25, 81 y.o.   MRN: [id number]  HPI Here for Medicare wellness and follow up of chronic medical conditions Reviewed form and advanced directives Here with daughter Amanda [name] Reviewed other doctors---- cardiologists Amanda [name] and Amanda [name] No alcohol or tobacco Not really exercises--walks around her place Still in independent apartment at [name]--daughter takes her shopping once a week Does instrumental ADLs in apartment Vision is okay Hearing poor-- but some help with her aides Did have several falls--tends to drag her feet. Minor injuries only Occasional brief depressed mood. No anhedonia No apparent cognitive problems of significance  No new concerns Mood is okay Still doesn't sleep well--despite the mirtazapine Melatonin not helpful  Did have 1 recent spell of racing heart May have lasted 30 minutes--- diarrhea and nausea and gas. That is when heart goes fast No chest pain or SOB No problems with pacer No major dizziness or syncope---slight orthostatic dizziness at times No significant edema  No regular confusion or nausea No claudication  Some pain in legs---from varicose veins (when vacuuming)  Current Outpatient Prescriptions on File Prior to Visit  Medication Sig Dispense Refill  . acetaminophen (TYLENOL) 500 MG tablet Take 500 mg by mouth as needed.      . cholecalciferol (VITAMIN D) 1000 UNITS tablet Take 1,000 Units by mouth daily.    . hydrochlorothiazide (HYDRODIURIL) 25 MG tablet Take 1 tablet by mouth  daily 90 tablet 3  . levothyroxine (SYNTHROID, LEVOTHROID) 50 MCG tablet Take 1 tablet (50 mcg total) by mouth daily before breakfast. 14 tablet 1  . lisinopril (PRINIVIL,ZESTRIL) 20 MG tablet Take 1 tablet (20 mg total) by mouth daily. 90 tablet 3  . Magnesium 250 MG TABS Take 250 mg by mouth daily.    . mirtazapine (REMERON) 15 MG tablet Take 1 tablet by mouth at  bedtime 90 tablet 3    . nitroGLYCERIN (NITROSTAT) 0.4 MG SL tablet Place 0.4 mg under the tongue every 5 (five) minutes as needed.      Marland Kitchen omeprazole (PRILOSEC) 20 MG capsule Take 1 capsule (20 mg total) by mouth 2 (two) times daily before a meal. 180 capsule 3  . sotalol (BETAPACE) 120 MG tablet Take 0.5 tablets (60 mg total) by mouth 2 (two) times daily. 90 tablet 3  . triamcinolone cream (KENALOG) 0.1 % Apply 1 application topically 2 (two) times daily as needed. 30 g 0  . trimethoprim (TRIMPEX) 100 MG tablet Take 1 tablet (100 mg total) by mouth daily. 90 tablet 3  . warfarin (COUMADIN) 1 MG tablet Take as directed by anti-coagulation clinic. 90 tablet 0  . warfarin (COUMADIN) 4 MG tablet Take 1 to 2 tablets by  mouth daily as directed. 180 tablet 3   No current facility-administered medications on file prior to visit.    Allergies  Allergen Reactions  . Amoxicillin Diarrhea    Weakness and atrial fib  . Augmentin [Amoxicillin-Pot Clavulanate] Diarrhea    Weakness and atrial fib  . Sulfonamide Derivatives     Past Medical History  Diagnosis Date  . Atrial fibrillation ([place])   . Hyperlipidemia   . Hypertension   . Hypothyroidism     after Rx for thyroid cancer  . Osteoarthritis   . Sleep disturbance   . Urinary incontinence     usually from UTI's  . Mitral regurgitation   . Depression   . Pacemaker   . GERD (gastroesophageal reflux  disease)     Past Surgical History  Procedure Laterality Date  . Tonsillectomy and adenoidectomy  1937  . Vesicovaginal fistula closure w/ tah  1972  . Goiter removed  1976    cancerous. Then got RAI  . Breast lumpectomy  1983  . Breast biopsy  2002  . Pacemaker insertion  5/07    after syncope  . Cataract extraction, bilateral      Family History  Problem Relation Age of Onset  . Diabetes Mother   . Stroke Sister   . Coronary artery disease Brother   . Prostate cancer Brother   . Breast cancer Neg Hx   . Colon cancer Neg Hx     Social History    Social History  . Marital Status: Widowed    Spouse Name: N/A  . Number of Children: 2  . Years of Education: N/A   Occupational History  . homemaker    Social History Main Topics  . Smoking status: Never Smoker   . Smokeless tobacco: Never Used  . Alcohol Use: No  . Drug Use: No  . Sexual Activity: Not on file   Other Topics Concern  . Not on file   Social History Narrative   Widowed 2002. 1 local daughter. 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    Daughter Amanda [name]) is health care POA.    Discussed DNR and she requests    Would not want tube feeds if cognitively unaware   Review of Systems No major arthritis pain--some hand pain Weight is stable Teeth are okay--- full dentures Bowels are very irregular still-- has tried probiotic. May go 3-4 times in AM, then stop. No new skin problems other than dry skin. Sees dermatologist regularly Continues on the preventative antibiotic    Objective:   Physical Exam  Constitutional: She is oriented to person, place, and time. She appears well-developed and well-nourished. No distress.  HENT:  Mouth/Throat: Oropharynx is clear and moist. No oropharyngeal exudate.  Neck: Normal range of motion. Neck supple. No thyromegaly present.  Cardiovascular: Normal rate, regular rhythm, normal heart sounds and intact distal pulses.   Faint pedal pulses  Pulmonary/Chest: Effort normal and breath sounds normal. No respiratory distress. She has no wheezes. She has no rales.  Abdominal: Soft. There is no tenderness.  Musculoskeletal: She exhibits no edema or tenderness.  Lymphadenopathy:    She has no cervical adenopathy.  Neurological: She is alert and oriented to person, place, and time.  President-- "Obama, Johann Capers, no..." [id number] Recall-- 1/3  Skin: No rash noted. No erythema.  Psychiatric: She has a normal mood and affect. Her behavior is normal.           Assessment & Plan:

## 2015-02-05 NOTE — Assessment & Plan Note (Signed)
I have personally reviewed the Medicare Annual Wellness questionnaire and have noted 1. The patient's medical and social history 2. Their use of alcohol, tobacco or illicit drugs 3. Their current medications and supplements 4. The patient's functional ability including ADL's, fall risks, home safety risks and hearing or visual             impairment. 5. Diet and physical activities 6. Evidence for depression or mood disorders  The patients weight, height, BMI and visual acuity have been recorded in the chart I have made referrals, counseling and provided education to the patient based review of the above and I have provided the pt with a written personalized care plan for preventive services.  I have provided you with a copy of your personalized plan for preventive services. Please take the time to review along with your updated medication list.  Flu vaccine today No cancer screening at her age Defer zostavax

## 2015-02-05 NOTE — Assessment & Plan Note (Signed)
Rare paroxysms Paced rhythm On coumadin

## 2015-02-06 ENCOUNTER — Telehealth: Payer: Self-pay | Admitting: Internal Medicine

## 2015-02-06 NOTE — Telephone Encounter (Signed)
She hadn't known of the CKD diagnosis in the past Discussed that this goes back some years and is related to age and multiple other conditions (like HTN) She is on lisinopril for renal protection Labs have been stable over the past year

## 2015-02-06 NOTE — Telephone Encounter (Signed)
[name] Koger returned your call - Please call back to [phone]  Thank you

## 2015-02-06 NOTE — Telephone Encounter (Signed)
See other phone note

## 2015-02-06 NOTE — Telephone Encounter (Signed)
Message left Labs were some better and will be mailed I will try to reach her later

## 2015-02-07 ENCOUNTER — Encounter: Payer: Self-pay | Admitting: *Deleted

## 2015-02-14 ENCOUNTER — Encounter: Payer: Medicare Other | Admitting: Internal Medicine

## 2015-02-27 ENCOUNTER — Ambulatory Visit (INDEPENDENT_AMBULATORY_CARE_PROVIDER_SITE_OTHER): Payer: Medicare Other | Admitting: *Deleted

## 2015-02-27 DIAGNOSIS — Z5181 Encounter for therapeutic drug level monitoring: Secondary | ICD-10-CM | POA: Diagnosis not present

## 2015-02-27 LAB — POCT INR: INR: 1.7

## 2015-02-27 NOTE — Progress Notes (Signed)
Pre visit review using our clinic review tool, if applicable. No additional management support is needed unless otherwise documented below in the visit note. 

## 2015-03-03 ENCOUNTER — Ambulatory Visit (INDEPENDENT_AMBULATORY_CARE_PROVIDER_SITE_OTHER): Payer: Medicare Other | Admitting: Cardiovascular Disease

## 2015-03-03 ENCOUNTER — Encounter: Payer: Self-pay | Admitting: Cardiovascular Disease

## 2015-03-03 VITALS — BP 122/62 | HR 64 | Ht 62.0 in | Wt 126.2 lb

## 2015-03-03 DIAGNOSIS — I10 Essential (primary) hypertension: Secondary | ICD-10-CM

## 2015-03-03 DIAGNOSIS — I480 Paroxysmal atrial fibrillation: Secondary | ICD-10-CM

## 2015-03-03 DIAGNOSIS — H43813 Vitreous degeneration, bilateral: Secondary | ICD-10-CM | POA: Diagnosis not present

## 2015-03-03 DIAGNOSIS — Z950 Presence of cardiac pacemaker: Secondary | ICD-10-CM

## 2015-03-03 DIAGNOSIS — I5032 Chronic diastolic (congestive) heart failure: Secondary | ICD-10-CM

## 2015-03-03 DIAGNOSIS — I059 Rheumatic mitral valve disease, unspecified: Secondary | ICD-10-CM

## 2015-03-03 DIAGNOSIS — E785 Hyperlipidemia, unspecified: Secondary | ICD-10-CM

## 2015-03-03 DIAGNOSIS — N184 Chronic kidney disease, stage 4 (severe): Secondary | ICD-10-CM

## 2015-03-03 MED ORDER — HYDROCHLOROTHIAZIDE 12.5 MG PO TABS: 12.5000 mg | ORAL_TABLET | Freq: Every day | ORAL | Status: DC

## 2015-03-03 NOTE — Assessment & Plan Note (Signed)
Pacemaker followed by Dr. Caryl Comes Atrial paced rhythm

## 2015-03-03 NOTE — Assessment & Plan Note (Signed)
Maintaining normal sinus rhythm. No symptoms concerning for arrhythmia  

## 2015-03-03 NOTE — Patient Instructions (Addendum)
You are doing well.  Please decrease the HCTZ down to 1/2 pill daily Drink more fluids  Please call us if you have new issues that need to be addressed before your next appt.  Your physician wants you to follow-up in: 6 months.  You will receive a reminder letter in the mail two months in advance. If you don't receive a letter, please call our office to schedule the follow-up appointment.

## 2015-03-03 NOTE — Assessment & Plan Note (Signed)
Daughter concerned as she does not drink very much. As she has no leg edema, episodes of lightheadedness particular associated with bowel movements, we'll decrease HCTZ to 12.5 mg daily Potentially in the future, could hold the HCTZ and take only as needed for ankle edema

## 2015-03-03 NOTE — Assessment & Plan Note (Signed)
Blood pressure well controlled, recent climb in her creatinine over the past year, does not drink much fluids in the daytime, no leg edema Recommended she decrease her HCTZ down to 12.5 mg daily

## 2015-03-03 NOTE — Assessment & Plan Note (Signed)
Tolerating simvastatin 20 mg daily. No recent lipid panel available. Will discuss whether she would like to schedule this

## 2015-03-03 NOTE — Assessment & Plan Note (Signed)
Recommended she increase her fluids, we will decrease HCTZ down to 12.5 mg daily

## 2015-03-03 NOTE — Progress Notes (Signed)
Patient ID: Amanda [name], female    DOB: 1919/10/21, 81 y.o.   MRN: [id number]  HPI Comments: Amanda [name] is a pleasant 81 year old woman who  has a history of paroxysmal atrial fibrillation, normal systolic function by echocardiogram in 2007 with  MR, mild aortic valve regurgitation, mildly dilated left atrium, Holter monitor and April 2007 showing frequent and complex supraventricular arrhythmia, cardiac CTA showing no significant coronary artery disease with atherosclerotic plaque in the thoracic aorta who has been managed on sotalol for rhythm control, pacer placed in May 2007 with a history of "fainting "prior to that who presents for routine followup of her atrial fibrillation She has pacemaker in place  In follow-up today, she reports that she has been doing well. She does report having occasional episodes of nausea in the morning followed by several quick bowel movements that are loose Often followed by lightheadedness Symptoms are rare, typically associated with bowel movements  Otherwise she reports that she has been doing well with no complaints. Does not do much exercise. Walks with a walker at times, no recent falls Does not use the facilities at [facility]  EKG on today's visit shows atrial paced rhythm with rate 64 bpm, nonspecific ST abnormality  Other past medical history  several previous falls  Previous echocardiogram for shortness of breath  echo showed mild LVH, diastolic relaxation abnormality, normal ejection fraction, mild to moderate MR, high normal right ventricular systolic pressures Cardiac catheterization in April 2007 showing no significant coronary artery disease.   Allergies  Allergen Reactions  . Amoxicillin Diarrhea    Weakness and atrial fib  . Augmentin [Amoxicillin-Pot Clavulanate] Diarrhea    Weakness and atrial fib  . Sulfonamide Derivatives     Outpatient Encounter Prescriptions as of 03/03/2015  Medication Sig  . acetaminophen (TYLENOL) 500  MG tablet Take 500 mg by mouth as needed.    . cholecalciferol (VITAMIN D) 1000 UNITS tablet Take 1,000 Units by mouth daily.  . hydrochlorothiazide (HYDRODIURIL) 12.5 MG tablet Take 1 tablet (12.5 mg total) by mouth daily.  Marland Kitchen levothyroxine (SYNTHROID, LEVOTHROID) 50 MCG tablet Take 1 tablet (50 mcg total) by mouth daily before breakfast.  . lisinopril (PRINIVIL,ZESTRIL) 20 MG tablet Take 1 tablet (20 mg total) by mouth daily.  . Magnesium 250 MG TABS Take 250 mg by mouth daily.  . mirtazapine (REMERON) 15 MG tablet Take 1 tablet by mouth at  bedtime  . nitroGLYCERIN (NITROSTAT) 0.4 MG SL tablet Place 0.4 mg under the tongue every 5 (five) minutes as needed.    Marland Kitchen omeprazole (PRILOSEC) 20 MG capsule Take 1 capsule (20 mg total) by mouth 2 (two) times daily before a meal.  . sotalol (BETAPACE) 120 MG tablet Take 0.5 tablets (60 mg total) by mouth 2 (two) times daily.  Marland Kitchen triamcinolone cream (KENALOG) 0.1 % Apply 1 application topically 2 (two) times daily as needed.  . trimethoprim (TRIMPEX) 100 MG tablet Take 1 tablet (100 mg total) by mouth daily.  Marland Kitchen warfarin (COUMADIN) 1 MG tablet Take as directed by anti-coagulation clinic.  Marland Kitchen warfarin (COUMADIN) 4 MG tablet Take 1 to 2 tablets by  mouth daily as directed.  . [DISCONTINUED] hydrochlorothiazide (HYDRODIURIL) 12.5 MG tablet Take 1 tablet (12.5 mg total) by mouth daily.  . [DISCONTINUED] hydrochlorothiazide (HYDRODIURIL) 25 MG tablet Take 1 tablet by mouth  daily   No facility-administered encounter medications on file as of 03/03/2015.    Past Medical History  Diagnosis Date  . Atrial fibrillation ([place])   .  Hyperlipidemia   . Hypertension   . Hypothyroidism     after Rx for thyroid cancer  . Osteoarthritis   . Sleep disturbance   . Urinary incontinence     usually from UTI's  . Mitral regurgitation   . Depression   . Pacemaker   . GERD (gastroesophageal reflux disease)     Past Surgical History  Procedure Laterality Date  .  Tonsillectomy and adenoidectomy  1937  . Vesicovaginal fistula closure w/ tah  1972  . Goiter removed  1976    cancerous. Then got RAI  . Breast lumpectomy  1983  . Breast biopsy  2002  . Pacemaker insertion  5/07    after syncope  . Cataract extraction, bilateral      Social History  reports that she has never smoked. She has never used smokeless tobacco. She reports that she does not drink alcohol or use illicit drugs.  Family History family history includes Coronary artery disease in her brother; Diabetes in her mother; Prostate cancer in her brother; Stroke in her sister. There is no history of Breast cancer or Colon cancer.  Review of Systems  Constitutional: Negative.   Eyes: Negative.   Respiratory: Negative.   Cardiovascular: Negative.   Gastrointestinal: Negative.   Musculoskeletal: Positive for gait problem.  Skin: Negative.   Neurological: Negative.   Hematological: Negative.   Psychiatric/Behavioral: Negative.   All other systems reviewed and are negative.   BP 122/62 mmHg  Pulse 64  Ht 5\' 2"  (1.575 m)  Wt 126 lb 4 oz (57.267 kg)  BMI 23.09 kg/m2\  Physical Exam  Constitutional: She is oriented to person, place, and time. She appears well-developed and well-nourished.  HENT:  Head: Normocephalic.  Nose: Nose normal.  Mouth/Throat: Oropharynx is clear and moist.  Eyes: Conjunctivae are normal. Pupils are equal, round, and reactive to light.  Neck: Normal range of motion. Neck supple. No JVD present.  Cardiovascular: Normal rate, regular rhythm, S1 normal, S2 normal, normal heart sounds and intact distal pulses.  Exam reveals no gallop and no friction rub.   No murmur heard. Pulmonary/Chest: Effort normal and breath sounds normal. No respiratory distress. She has no wheezes. She has no rales. She exhibits no tenderness.  Abdominal: Soft. Bowel sounds are normal. She exhibits no distension. There is no tenderness.  Musculoskeletal: Normal range of motion. She  exhibits no edema or tenderness.  Lymphadenopathy:    She has no cervical adenopathy.  Neurological: She is alert and oriented to person, place, and time. Coordination normal.  Skin: Skin is warm and dry. No rash noted. No erythema.  Psychiatric: She has a normal mood and affect. Her behavior is normal. Judgment and thought content normal.    Assessment and Plan  Nursing note and vitals reviewed.

## 2015-03-04 ENCOUNTER — Other Ambulatory Visit: Payer: Self-pay | Admitting: Internal Medicine

## 2015-03-04 NOTE — Telephone Encounter (Signed)
Amanda [name] (DPR signed) left v/m; requesting cb when levothyroxine was refilled. Amanda [name] already done; Amanda [name] ([place]) voiced understanding.

## 2015-03-10 ENCOUNTER — Other Ambulatory Visit: Payer: Self-pay | Admitting: Internal Medicine

## 2015-03-17 ENCOUNTER — Ambulatory Visit (INDEPENDENT_AMBULATORY_CARE_PROVIDER_SITE_OTHER): Payer: Medicare Other | Admitting: *Deleted

## 2015-03-17 DIAGNOSIS — Z5181 Encounter for therapeutic drug level monitoring: Secondary | ICD-10-CM | POA: Diagnosis not present

## 2015-03-17 LAB — POCT INR: INR: 1.7

## 2015-03-17 NOTE — Progress Notes (Signed)
Pre visit review using our clinic review tool, if applicable. No additional management support is needed unless otherwise documented below in the visit note. 

## 2015-03-21 DIAGNOSIS — S63501A Unspecified sprain of right wrist, initial encounter: Secondary | ICD-10-CM | POA: Diagnosis not present

## 2015-03-27 ENCOUNTER — Encounter: Payer: Medicare Other | Admitting: Internal Medicine

## 2015-04-02 DIAGNOSIS — L578 Other skin changes due to chronic exposure to nonionizing radiation: Secondary | ICD-10-CM | POA: Diagnosis not present

## 2015-04-02 DIAGNOSIS — L821 Other seborrheic keratosis: Secondary | ICD-10-CM | POA: Diagnosis not present

## 2015-04-02 DIAGNOSIS — L570 Actinic keratosis: Secondary | ICD-10-CM | POA: Diagnosis not present

## 2015-04-02 DIAGNOSIS — L820 Inflamed seborrheic keratosis: Secondary | ICD-10-CM | POA: Diagnosis not present

## 2015-04-03 ENCOUNTER — Ambulatory Visit: Payer: Medicare Other

## 2015-04-10 ENCOUNTER — Ambulatory Visit (INDEPENDENT_AMBULATORY_CARE_PROVIDER_SITE_OTHER): Payer: Medicare Other | Admitting: *Deleted

## 2015-04-10 DIAGNOSIS — Z5181 Encounter for therapeutic drug level monitoring: Secondary | ICD-10-CM | POA: Diagnosis not present

## 2015-04-10 LAB — POCT INR: INR: 2.3

## 2015-04-10 NOTE — Progress Notes (Signed)
Pre visit review using our clinic review tool, if applicable. No additional management support is needed unless otherwise documented below in the visit note. 

## 2015-04-22 ENCOUNTER — Other Ambulatory Visit: Payer: Self-pay | Admitting: Internal Medicine

## 2015-04-29 ENCOUNTER — Encounter: Payer: Self-pay | Admitting: Internal Medicine

## 2015-04-29 ENCOUNTER — Ambulatory Visit (INDEPENDENT_AMBULATORY_CARE_PROVIDER_SITE_OTHER): Payer: Medicare Other | Admitting: Internal Medicine

## 2015-04-29 VITALS — BP 146/77 | HR 79 | Ht 62.0 in | Wt 127.0 lb

## 2015-04-29 DIAGNOSIS — I4891 Unspecified atrial fibrillation: Secondary | ICD-10-CM

## 2015-04-29 DIAGNOSIS — I480 Paroxysmal atrial fibrillation: Secondary | ICD-10-CM

## 2015-04-29 DIAGNOSIS — I495 Sick sinus syndrome: Secondary | ICD-10-CM

## 2015-04-29 LAB — CUP PACEART INCLINIC DEVICE CHECK
Battery Remaining Longevity: 5 mo
Brady Statistic AP VP Percent: 3 %
Brady Statistic AS VS Percent: 1 %
Date Time Interrogation Session: 20170110130302
Implantable Lead Implant Date: 20070524
Implantable Lead Location: 753859
Lead Channel Pacing Threshold Amplitude: 1 V
Lead Channel Pacing Threshold Pulse Width: 0.4 ms
Lead Channel Sensing Intrinsic Amplitude: 4 mV
Lead Channel Setting Pacing Amplitude: 2 V
Lead Channel Setting Pacing Amplitude: 2.5 V
Lead Channel Setting Pacing Pulse Width: 0.4 ms
Lead Channel Setting Sensing Sensitivity: 2 mV
MDC IDC LEAD IMPLANT DT: 20070524
MDC IDC LEAD LOCATION: 753860
MDC IDC MSMT BATTERY IMPEDANCE: 4853 Ohm
MDC IDC MSMT BATTERY VOLTAGE: 2.63 V
MDC IDC MSMT LEADCHNL RA IMPEDANCE VALUE: 830 Ohm
MDC IDC MSMT LEADCHNL RA PACING THRESHOLD AMPLITUDE: 0.75 V
MDC IDC MSMT LEADCHNL RA PACING THRESHOLD PULSEWIDTH: 0.4 ms
MDC IDC MSMT LEADCHNL RA SENSING INTR AMPL: 2 mV
MDC IDC MSMT LEADCHNL RV IMPEDANCE VALUE: 423 Ohm
MDC IDC STAT BRADY AP VS PERCENT: 96 %
MDC IDC STAT BRADY AS VP PERCENT: 0 %

## 2015-04-29 NOTE — Progress Notes (Signed)
Patient Care Team: Venia Carbon, MD as PCP - General   HPI  Amanda [name] is a 82 y.o. female seen in followup with a previously implanted pacemaker in 2007 for syncope and apparently tachybradycardia syndrome; she has a history of paroxysmal atrial fibrillation managed with sotalol.  Last creatinine was  1.4 with GFR 39 --3/15  At the last visit we decreased the sotalol on creatinine clearance.  Her atrial fibrillation burden 0.2% although she has complaints of palpitations  Last echocardiogram was in [place] EF was about 65%  Her K was 4.0 10/16;  Mg 1.8  11 months ago, but was 1.4 the month before    Past Medical History  Diagnosis Date  . Atrial fibrillation ([place])   . Hyperlipidemia   . Hypertension   . Hypothyroidism     after Rx for thyroid cancer  . Osteoarthritis   . Sleep disturbance   . Urinary incontinence     usually from UTI's  . Mitral regurgitation   . Depression   . Pacemaker   . GERD (gastroesophageal reflux disease)     Past Surgical History  Procedure Laterality Date  . Tonsillectomy and adenoidectomy  1937  . Vesicovaginal fistula closure w/ tah  1972  . Goiter removed  1976    cancerous. Then got RAI  . Breast lumpectomy  1983  . Breast biopsy  2002  . Pacemaker insertion  5/07    after syncope  . Cataract extraction, bilateral      Current Outpatient Prescriptions  Medication Sig Dispense Refill  . acetaminophen (TYLENOL) 500 MG tablet Take 500 mg by mouth as needed.      . cholecalciferol (VITAMIN D) 1000 UNITS tablet Take 1,000 Units by mouth daily.    . hydrochlorothiazide (HYDRODIURIL) 12.5 MG tablet Take 1 tablet (12.5 mg total) by mouth daily. 90 tablet 3  . levothyroxine (SYNTHROID, LEVOTHROID) 50 MCG tablet Take 1 tablet (50 mcg  total) by mouth daily  before breakfast. 90 tablet 3  . lisinopril (PRINIVIL,ZESTRIL) 20 MG tablet Take 1 tablet (20 mg total) by mouth daily. 90 tablet 3  . Magnesium 250 MG TABS Take  250 mg by mouth daily.    . mirtazapine (REMERON) 15 MG tablet Take 1 tablet by mouth at  bedtime 90 tablet 3  . nitroGLYCERIN (NITROSTAT) 0.4 MG SL tablet Place 0.4 mg under the tongue every 5 (five) minutes as needed.      Marland Kitchen omeprazole (PRILOSEC) 20 MG capsule Take 1 capsule (20 mg total) by mouth 2 (two) times daily before a meal. 180 capsule 3  . sotalol (BETAPACE) 120 MG tablet Take 0.5 tablets (60 mg total) by mouth 2 (two) times daily. 90 tablet 3  . triamcinolone cream (KENALOG) 0.1 % Apply 1 application topically 2 (two) times daily as needed. 30 g 0  . trimethoprim (TRIMPEX) 100 MG tablet Take 1 tablet by mouth  daily 90 tablet 3  . warfarin (COUMADIN) 1 MG tablet TAKE AS DIRECTED BY ANTI-COAGULATION CLINIC. 90 tablet 0  . warfarin (COUMADIN) 4 MG tablet Take 1 to 2 tablets by  mouth daily as directed. 180 tablet 3   No current facility-administered medications for this visit.    Allergies  Allergen Reactions  . Amoxicillin Diarrhea    Weakness and atrial fib  . Augmentin [Amoxicillin-Pot Clavulanate] Diarrhea    Weakness and atrial fib  . Sulfonamide Derivatives     Review of Systems negative except from HPI  and PMH  Physical Exam BP 146/77 mmHg  Pulse 79  Ht 5\' 2"  (1.575 m)  Wt 127 lb (57.607 kg)  BMI 23.22 kg/m2 Well developed and well nourished in no acute distress HENT normal  esotropia E scleral and icterus clear Neck Supple JVP flat; carotids brisk and full Device pocket well healed; without hematoma or erythema.  There is no tethering  Clear to ausculation  Regular rate and rhythm, 2/6 murmur Soft with active bowel sounds No clubbing cyanosis Trace Edema swelling of the left thenar eminence Alert and oriented, grossly normal motor and sensory function Skin Warm and Dry    Assessment and  Plan  Atrial fibrillation with some undersensing  Pacemaker-[employer]  The patient's device was interrogated and the information was fully reviewed.  The device was  reprogrammed to improve atrial sensing; her device is approaching ERI and we reviewed the symptoms potentially associated with reversion to VVI   High risk medication surveillance  Renal insufficiency Gd 4  Puncture wound left hand  She remains on sotalol at low dose. She's had issues with magnesium in the past all be now she is on supplementation   we will check it again today. I have been in touch with Dr. Amedeo Plenty regarding her left hand. I'm concerned not withstanding the absence of pain about infection as the wound was closed primarily. We will get a CBC.    Blood pressure is modestly elevated. Previous assessments however over the fall has been well within normal.  Her GFR has been under 20 probably for the last year plus or so. Her creatinine actually decreased March--October 2016. We will repeat it today. Based on what I can read, is probably reasonable to maintain her ACE inhibitor at this time. It is likely that the hydrochlorothiazide will become increasingly less effective as her GFR decreases. Right now she is euvolemic so we will continue it.  Her sotalol dose is appropriately down titrated.

## 2015-04-29 NOTE — Patient Instructions (Signed)
Medication Instructions: - No changes  Labwork: - Your physician recommends that you have lab work today: BMP/ Magnesium/ CBC  Procedures/Testing: - none  Follow-Up: - Remote monitoring is used to monitor your Pacemaker of ICD from home. This monitoring reduces the number of office visits required to check your device to one time per year. It allows [place] to keep an eye on the functioning of your device to ensure it is working properly. You are scheduled for a device check from home on 06/04/15. You may send your transmission at any time that day. If you have a wireless device, the transmission will be sent automatically. After your physician reviews your transmission, you will receive a postcard with your next transmission date.  - Your physician wants you to follow-up in: 6 months with Dr. Caryl Comes. You will receive a reminder letter in the mail two months in advance. If you don't receive a letter, please call our office to schedule the follow-up appointment.  Any Additional Special Instructions Will Be Listed Below (If Applicable).

## 2015-04-30 LAB — CBC WITH DIFFERENTIAL/PLATELET
BASOS ABS: 0 10*3/uL (ref 0.0–0.2)
Basos: 1 %
EOS (ABSOLUTE): 0.2 10*3/uL (ref 0.0–0.4)
Eos: 3 %
HEMOGLOBIN: 8.9 g/dL — AB (ref 11.1–15.9)
Hematocrit: 27.4 % — ABNORMAL LOW (ref 34.0–46.6)
IMMATURE GRANS (ABS): 0 10*3/uL (ref 0.0–0.1)
Immature Granulocytes: 0 %
LYMPHS ABS: 1.4 10*3/uL (ref 0.7–3.1)
LYMPHS: 26 %
MCH: 28.5 pg (ref 26.6–33.0)
MCHC: 32.5 g/dL (ref 31.5–35.7)
MCV: 88 fL (ref 79–97)
MONOCYTES: 11 %
Monocytes Absolute: 0.6 10*3/uL (ref 0.1–0.9)
NEUTROS ABS: 3.1 10*3/uL (ref 1.4–7.0)
Neutrophils: 59 %
PLATELETS: 251 10*3/uL (ref 150–379)
RBC: 3.12 x10E6/uL — AB (ref 3.77–5.28)
RDW: 15.7 % — ABNORMAL HIGH (ref 12.3–15.4)
WBC: 5.2 10*3/uL (ref 3.4–10.8)

## 2015-04-30 LAB — BASIC METABOLIC PANEL
BUN/Creatinine Ratio: 25 (ref 11–26)
BUN: 40 mg/dL — AB (ref 10–36)
CALCIUM: 9.4 mg/dL (ref 8.7–10.3)
CHLORIDE: 100 mmol/L (ref 96–106)
CO2: 24 mmol/L (ref 18–29)
Creatinine, Ser: 1.57 mg/dL — ABNORMAL HIGH (ref 0.57–1.00)
GFR calc Af Amer: 33 mL/min/{1.73_m2} — ABNORMAL LOW (ref >59)
GFR calc non Af Amer: 28 mL/min/{1.73_m2} — ABNORMAL LOW (ref >59)
GLUCOSE: 79 mg/dL (ref 65–99)
POTASSIUM: 4.1 mmol/L (ref 3.5–5.2)
Sodium: 141 mmol/L (ref 134–144)

## 2015-04-30 LAB — MAGNESIUM: MAGNESIUM: 1.7 mg/dL (ref 1.6–2.3)

## 2015-05-01 ENCOUNTER — Other Ambulatory Visit: Payer: Self-pay

## 2015-05-01 ENCOUNTER — Telehealth: Payer: Self-pay | Admitting: Internal Medicine

## 2015-05-01 MED ORDER — CEPHALEXIN 250 MG PO CAPS: 250.0000 mg | ORAL_CAPSULE | Freq: Two times a day (BID) | ORAL | Status: DC

## 2015-05-01 NOTE — Telephone Encounter (Signed)
Pt daughter, [name], returned call from [employer]. Reviewed phone note. Pt to take keflex 250mg  BID x 7 days for hand injury. Daughter expresses concern whether pt should take the medication before seeing Dr. Silvio Pate next week. States she "doesn't want to do anything wrong" and asks me to send prescription. Prescription sent to [facility] [place].

## 2015-05-01 NOTE — Telephone Encounter (Signed)
Per Dr. Caryl Comes- start Keflex 250 mg one capsule by mouth BID x 7 days for hand injury. I left a message for the patient to call.

## 2015-05-05 ENCOUNTER — Ambulatory Visit (INDEPENDENT_AMBULATORY_CARE_PROVIDER_SITE_OTHER): Payer: Medicare Other | Admitting: *Deleted

## 2015-05-05 ENCOUNTER — Ambulatory Visit (INDEPENDENT_AMBULATORY_CARE_PROVIDER_SITE_OTHER): Payer: Medicare Other | Admitting: Internal Medicine

## 2015-05-05 ENCOUNTER — Encounter: Payer: Self-pay | Admitting: Internal Medicine

## 2015-05-05 VITALS — BP 122/70 | HR 73 | Wt 126.0 lb

## 2015-05-05 DIAGNOSIS — Z5181 Encounter for therapeutic drug level monitoring: Secondary | ICD-10-CM | POA: Diagnosis not present

## 2015-05-05 DIAGNOSIS — S6992XA Unspecified injury of left wrist, hand and finger(s), initial encounter: Secondary | ICD-10-CM | POA: Diagnosis not present

## 2015-05-05 LAB — POCT INR: INR: 3

## 2015-05-05 NOTE — Assessment & Plan Note (Signed)
Some residual inflammation but no evidence of infection Discussed warm compresses No antibiotic needed at this point

## 2015-05-05 NOTE — Progress Notes (Signed)
Pre visit review using our clinic review tool, if applicable. No additional management support is needed unless otherwise documented below in the visit note. 

## 2015-05-05 NOTE — Progress Notes (Signed)
Subjective:    Patient ID: Amanda [name], female    DOB: 12-Jul-1919, 82 y.o.   MRN: [id number]  HPI Here with daughter Amanda [name] for recheck on left hand injury  Stabbed it with a knife--slipped taking a label off a bottle Almost 3 weeks ago Saw Dr Caryl Comes last week---gave antibiotic as precaution--but she didn't take it Ongoing pain but not really inflamed Stabbed just distal to thenar eminence--- and still mildly tender at thenar eminence  Uses normally but pain when using significantly--like squeezing out a wash cloth  Current Outpatient Prescriptions on File Prior to Visit  Medication Sig Dispense Refill  . acetaminophen (TYLENOL) 500 MG tablet Take 500 mg by mouth as needed.      . cholecalciferol (VITAMIN D) 1000 UNITS tablet Take 1,000 Units by mouth daily.    . hydrochlorothiazide (HYDRODIURIL) 12.5 MG tablet Take 1 tablet (12.5 mg total) by mouth daily. 90 tablet 3  . levothyroxine (SYNTHROID, LEVOTHROID) 50 MCG tablet Take 1 tablet (50 mcg  total) by mouth daily  before breakfast. 90 tablet 3  . lisinopril (PRINIVIL,ZESTRIL) 20 MG tablet Take 1 tablet (20 mg total) by mouth daily. 90 tablet 3  . Magnesium 250 MG TABS Take 250 mg by mouth daily.    . mirtazapine (REMERON) 15 MG tablet Take 1 tablet by mouth at  bedtime 90 tablet 3  . nitroGLYCERIN (NITROSTAT) 0.4 MG SL tablet Place 0.4 mg under the tongue every 5 (five) minutes as needed.      Marland Kitchen omeprazole (PRILOSEC) 20 MG capsule Take 1 capsule (20 mg total) by mouth 2 (two) times daily before a meal. 180 capsule 3  . sotalol (BETAPACE) 120 MG tablet Take 0.5 tablets (60 mg total) by mouth 2 (two) times daily. 90 tablet 3  . triamcinolone cream (KENALOG) 0.1 % Apply 1 application topically 2 (two) times daily as needed. 30 g 0  . trimethoprim (TRIMPEX) 100 MG tablet Take 1 tablet by mouth  daily 90 tablet 3  . warfarin (COUMADIN) 1 MG tablet TAKE AS DIRECTED BY ANTI-COAGULATION CLINIC. 90 tablet 0  . warfarin (COUMADIN) 4 MG  tablet Take 1 to 2 tablets by  mouth daily as directed. 180 tablet 3   No current facility-administered medications on file prior to visit.    Allergies  Allergen Reactions  . Amoxicillin Diarrhea    Weakness and atrial fib  . Augmentin [Amoxicillin-Pot Clavulanate] Diarrhea    Weakness and atrial fib  . Sulfonamide Derivatives     Past Medical History  Diagnosis Date  . Atrial fibrillation ([place])   . Hyperlipidemia   . Hypertension   . Hypothyroidism     after Rx for thyroid cancer  . Osteoarthritis   . Sleep disturbance   . Urinary incontinence     usually from UTI's  . Mitral regurgitation   . Depression   . Pacemaker   . GERD (gastroesophageal reflux disease)     Past Surgical History  Procedure Laterality Date  . Tonsillectomy and adenoidectomy  1937  . Vesicovaginal fistula closure w/ tah  1972  . Goiter removed  1976    cancerous. Then got RAI  . Breast lumpectomy  1983  . Breast biopsy  2002  . Pacemaker insertion  5/07    after syncope  . Cataract extraction, bilateral      Family History  Problem Relation Age of Onset  . Diabetes Mother   . Stroke Sister   . Coronary artery disease Brother   .  Prostate cancer Brother   . Breast cancer Neg Hx   . Colon cancer Neg Hx     Social History   Social History  . Marital Status: Widowed    Spouse Name: N/A  . Number of Children: 2  . Years of Education: N/A   Occupational History  . homemaker    Social History Main Topics  . Smoking status: Never Smoker   . Smokeless tobacco: Never Used  . Alcohol Use: No  . Drug Use: No  . Sexual Activity: Not on file   Other Topics Concern  . Not on file   Social History Narrative   Widowed 2002. 1 local daughter. 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 Was homemaker. Farmed and gardened. 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    Daughter Amanda [name]) is health care POA.    Discussed DNR and she requests    Would not want tube feeds if cognitively unaware   Review of Systems  No fever WBC  normal last week Appetite is fine     Objective:   Physical Exam  Constitutional: She appears well-developed and well-nourished. No distress.  Skin:  Puncture site on flexor left thumb at DIP---healed Mild swelling over thenar eminence--with slight pink color and tenderness but no warmth          Assessment & Plan:

## 2015-05-08 ENCOUNTER — Telehealth: Payer: Self-pay | Admitting: *Deleted

## 2015-05-08 NOTE — Telephone Encounter (Signed)
SPOKE TO PT DAUGHTER ABOUT RESULTS

## 2015-05-16 ENCOUNTER — Encounter: Payer: Self-pay | Admitting: Internal Medicine

## 2015-06-04 ENCOUNTER — Ambulatory Visit (INDEPENDENT_AMBULATORY_CARE_PROVIDER_SITE_OTHER): Payer: Medicare Other | Admitting: *Deleted

## 2015-06-04 ENCOUNTER — Telehealth: Payer: Self-pay | Admitting: Cardiology

## 2015-06-04 DIAGNOSIS — Z950 Presence of cardiac pacemaker: Secondary | ICD-10-CM

## 2015-06-04 NOTE — Telephone Encounter (Signed)
LMOVM reminding pt to send remote transmission.   

## 2015-06-04 NOTE — Progress Notes (Signed)
Remote pacemaker transmission.   

## 2015-06-05 ENCOUNTER — Other Ambulatory Visit (INDEPENDENT_AMBULATORY_CARE_PROVIDER_SITE_OTHER): Payer: Medicare Other

## 2015-06-05 DIAGNOSIS — Z5181 Encounter for therapeutic drug level monitoring: Secondary | ICD-10-CM | POA: Diagnosis not present

## 2015-06-05 LAB — POCT INR: INR: 3.5

## 2015-06-06 ENCOUNTER — Ambulatory Visit (INDEPENDENT_AMBULATORY_CARE_PROVIDER_SITE_OTHER): Payer: Medicare Other | Admitting: *Deleted

## 2015-06-06 DIAGNOSIS — Z5181 Encounter for therapeutic drug level monitoring: Secondary | ICD-10-CM

## 2015-06-06 NOTE — Progress Notes (Signed)
Pre visit review using our clinic review tool, if applicable. No additional management support is needed unless otherwise documented below in the visit note. 

## 2015-06-09 LAB — CUP PACEART REMOTE DEVICE CHECK
Brady Statistic AP VP Percent: 1 %
Brady Statistic AS VP Percent: 0 %
Date Time Interrogation Session: 20170215174852
Implantable Lead Implant Date: 20070524
Implantable Lead Location: 753859
Implantable Lead Model: 5076
Implantable Lead Model: 5568
Lead Channel Pacing Threshold Amplitude: 0.875 V
Lead Channel Pacing Threshold Amplitude: 1 V
Lead Channel Pacing Threshold Pulse Width: 0.4 ms
MDC IDC LEAD IMPLANT DT: 20070524
MDC IDC LEAD LOCATION: 753860
MDC IDC MSMT BATTERY IMPEDANCE: 5626 Ohm
MDC IDC MSMT BATTERY REMAINING LONGEVITY: 2 mo
MDC IDC MSMT BATTERY VOLTAGE: 2.61 V
MDC IDC MSMT LEADCHNL RA IMPEDANCE VALUE: 832 Ohm
MDC IDC MSMT LEADCHNL RV IMPEDANCE VALUE: 424 Ohm
MDC IDC MSMT LEADCHNL RV PACING THRESHOLD PULSEWIDTH: 0.4 ms
MDC IDC MSMT LEADCHNL RV SENSING INTR AMPL: 5.6 mV
MDC IDC SET LEADCHNL RA PACING AMPLITUDE: 2 V
MDC IDC SET LEADCHNL RV PACING AMPLITUDE: 2.5 V
MDC IDC SET LEADCHNL RV PACING PULSEWIDTH: 0.4 ms
MDC IDC SET LEADCHNL RV SENSING SENSITIVITY: 2 mV
MDC IDC STAT BRADY AP VS PERCENT: 95 %
MDC IDC STAT BRADY AS VS PERCENT: 4 %

## 2015-06-12 ENCOUNTER — Other Ambulatory Visit: Payer: Self-pay

## 2015-06-12 ENCOUNTER — Other Ambulatory Visit: Payer: Self-pay | Admitting: Internal Medicine

## 2015-06-12 MED ORDER — LEVOTHYROXINE SODIUM 50 MCG PO TABS: ORAL_TABLET | ORAL | Status: DC

## 2015-06-12 NOTE — Telephone Encounter (Signed)
Amanda [name] (DPR signed) request # [id number] levothyroxine 50 mcg to [facility] [place] while waiting on mail order rx to come. Advised done.

## 2015-06-13 ENCOUNTER — Encounter: Payer: Self-pay | Admitting: Cardiology

## 2015-06-20 DIAGNOSIS — H903 Sensorineural hearing loss, bilateral: Secondary | ICD-10-CM | POA: Diagnosis not present

## 2015-06-20 DIAGNOSIS — H6123 Impacted cerumen, bilateral: Secondary | ICD-10-CM | POA: Diagnosis not present

## 2015-06-23 ENCOUNTER — Ambulatory Visit (INDEPENDENT_AMBULATORY_CARE_PROVIDER_SITE_OTHER): Payer: Medicare Other | Admitting: *Deleted

## 2015-06-23 DIAGNOSIS — Z5181 Encounter for therapeutic drug level monitoring: Secondary | ICD-10-CM

## 2015-06-23 LAB — POCT INR: INR: 2.1

## 2015-06-23 NOTE — Progress Notes (Signed)
Pre visit review using our clinic review tool, if applicable. No additional management support is needed unless otherwise documented below in the visit note. 

## 2015-07-07 ENCOUNTER — Ambulatory Visit (INDEPENDENT_AMBULATORY_CARE_PROVIDER_SITE_OTHER): Payer: Medicare Other | Admitting: *Deleted

## 2015-07-07 DIAGNOSIS — Z950 Presence of cardiac pacemaker: Secondary | ICD-10-CM

## 2015-07-08 NOTE — Progress Notes (Signed)
Remote pacemaker transmission.   

## 2015-07-21 ENCOUNTER — Ambulatory Visit (INDEPENDENT_AMBULATORY_CARE_PROVIDER_SITE_OTHER): Payer: Medicare Other | Admitting: *Deleted

## 2015-07-21 DIAGNOSIS — Z5181 Encounter for therapeutic drug level monitoring: Secondary | ICD-10-CM | POA: Diagnosis not present

## 2015-07-21 LAB — POCT INR: INR: 1.8

## 2015-07-21 NOTE — Progress Notes (Signed)
Pre visit review using our clinic review tool, if applicable. No additional management support is needed unless otherwise documented below in the visit note. 

## 2015-07-21 NOTE — Progress Notes (Signed)
INR is sub therapeutic today.  Patient denies any missed doses, new medications, or diet changes.  Will boost with 4 mg today, then continue as scheduled.  Patient encouraged to avoid greens for the next 1-2 days.  Will recheck in 2 weeks and make additional adjustments at that time if needed.

## 2015-07-26 ENCOUNTER — Other Ambulatory Visit: Payer: Self-pay | Admitting: Internal Medicine

## 2015-08-04 ENCOUNTER — Telehealth: Payer: Self-pay | Admitting: Cardiology

## 2015-08-04 ENCOUNTER — Ambulatory Visit (INDEPENDENT_AMBULATORY_CARE_PROVIDER_SITE_OTHER): Payer: Medicare Other | Admitting: *Deleted

## 2015-08-04 DIAGNOSIS — Z5181 Encounter for therapeutic drug level monitoring: Secondary | ICD-10-CM | POA: Diagnosis not present

## 2015-08-04 LAB — POCT INR: INR: 1.8

## 2015-08-04 NOTE — Telephone Encounter (Signed)
Spoke w/ pt daughter and informed her that pt device has reached ERI. Informed her that a scheduler will call to schedule and appt w/ MD / NP / PA. Pt daughter verbalized understanding.

## 2015-08-04 NOTE — Progress Notes (Signed)
Pre visit review using our clinic review tool, if applicable. No additional management support is needed unless otherwise documented below in the visit note. 

## 2015-08-04 NOTE — Progress Notes (Signed)
INR remains sub therapeutic despite recent boost.  Patient denies any missed doses, new medications or diet changes.  Patient was instructed to take 3 mg today, then begin taking 3 mg daily except 2 mg on Mondays only.  She was encouraged to avoid greens for the next 2-3 days.  Will recheck in 2-3 weeks and make additional adjustments at that time if needed.

## 2015-08-04 NOTE — Telephone Encounter (Signed)
Spoke w/ pt son in law and he is going to have pt daughter call me back.

## 2015-08-06 ENCOUNTER — Ambulatory Visit: Payer: Medicare Other | Admitting: Internal Medicine

## 2015-08-08 ENCOUNTER — Telehealth: Payer: Self-pay | Admitting: Cardiology

## 2015-08-08 ENCOUNTER — Ambulatory Visit (INDEPENDENT_AMBULATORY_CARE_PROVIDER_SITE_OTHER): Payer: Medicare Other | Admitting: Internal Medicine

## 2015-08-08 ENCOUNTER — Encounter: Payer: Self-pay | Admitting: Internal Medicine

## 2015-08-08 ENCOUNTER — Encounter: Payer: Medicare Other | Admitting: *Deleted

## 2015-08-08 VITALS — BP 106/60 | HR 75 | Temp 97.2°F | Wt 128.0 lb

## 2015-08-08 DIAGNOSIS — D631 Anemia in chronic kidney disease: Secondary | ICD-10-CM

## 2015-08-08 DIAGNOSIS — E039 Hypothyroidism, unspecified: Secondary | ICD-10-CM

## 2015-08-08 DIAGNOSIS — N184 Chronic kidney disease, stage 4 (severe): Secondary | ICD-10-CM | POA: Diagnosis not present

## 2015-08-08 DIAGNOSIS — I4891 Unspecified atrial fibrillation: Secondary | ICD-10-CM

## 2015-08-08 DIAGNOSIS — I5032 Chronic diastolic (congestive) heart failure: Secondary | ICD-10-CM | POA: Diagnosis not present

## 2015-08-08 DIAGNOSIS — F39 Unspecified mood [affective] disorder: Secondary | ICD-10-CM

## 2015-08-08 LAB — CBC WITH DIFFERENTIAL/PLATELET
BASOS ABS: 0 10*3/uL (ref 0.0–0.1)
Basophils Relative: 0.7 % (ref 0.0–3.0)
EOS ABS: 0.2 10*3/uL (ref 0.0–0.7)
Eosinophils Relative: 2.8 % (ref 0.0–5.0)
HEMATOCRIT: 30.2 % — AB (ref 36.0–46.0)
Hemoglobin: 10.1 g/dL — ABNORMAL LOW (ref 12.0–15.0)
LYMPHS PCT: 26.6 % (ref 12.0–46.0)
Lymphs Abs: 1.7 10*3/uL (ref 0.7–4.0)
MCHC: 33.6 g/dL (ref 30.0–36.0)
MCV: 86.6 fl (ref 78.0–100.0)
Monocytes Absolute: 0.7 10*3/uL (ref 0.1–1.0)
Monocytes Relative: 11.3 % (ref 3.0–12.0)
NEUTROS ABS: 3.6 10*3/uL (ref 1.4–7.7)
Neutrophils Relative %: 58.6 % (ref 43.0–77.0)
PLATELETS: 234 10*3/uL (ref 150.0–400.0)
RBC: 3.48 Mil/uL — AB (ref 3.87–5.11)
RDW: 15.4 % (ref 11.5–15.5)
WBC: 6.2 10*3/uL (ref 4.0–10.5)

## 2015-08-08 LAB — T4, FREE: Free T4: 0.67 ng/dL (ref 0.60–1.60)

## 2015-08-08 LAB — RENAL FUNCTION PANEL
Albumin: 4 g/dL (ref 3.5–5.2)
BUN: 31 mg/dL — ABNORMAL HIGH (ref 6–23)
CO2: 29 meq/L (ref 19–32)
CREATININE: 1.78 mg/dL — AB (ref 0.40–1.20)
Calcium: 9.4 mg/dL (ref 8.4–10.5)
Chloride: 101 mEq/L (ref 96–112)
GFR: 28.24 mL/min — AB (ref >60.00)
GLUCOSE: 95 mg/dL (ref 70–99)
PHOSPHORUS: 3.2 mg/dL (ref 2.3–4.6)
POTASSIUM: 4 meq/L (ref 3.5–5.1)
Sodium: 141 mEq/L (ref 135–145)

## 2015-08-08 LAB — TSH: TSH: 30.2 u[IU]/mL — AB (ref 0.35–4.50)

## 2015-08-08 NOTE — Patient Instructions (Signed)
Please restart your iron tablet daily.

## 2015-08-08 NOTE — Assessment & Plan Note (Signed)
Paced On coumadin/sotalol Sees Dr Caryl Comes

## 2015-08-08 NOTE — Progress Notes (Signed)
Pre visit review using our clinic review tool, if applicable. No additional management support is needed unless otherwise documented below in the visit note. 

## 2015-08-08 NOTE — Assessment & Plan Note (Signed)
Does well on mirtazapine but relapses off Will continue

## 2015-08-08 NOTE — Assessment & Plan Note (Signed)
Will recheck Causing the RLS Will have her restart the iron Heme to consider EPO if worse

## 2015-08-08 NOTE — Assessment & Plan Note (Signed)
On ACEI Will check PTH also--is on vitamin D

## 2015-08-08 NOTE — Addendum Note (Signed)
Addended by: Marchia Bond on: 08/08/2015 11:33 AM   Modules accepted: Orders

## 2015-08-08 NOTE — Assessment & Plan Note (Signed)
Compensated. No changes needed. 

## 2015-08-08 NOTE — Progress Notes (Signed)
Subjective:    Patient ID: Amanda [name], female    DOB: Apr 19, 1920, 82 y.o.   MRN: [id number]  HPI Here with daughter for follow up of chronic health conditions  Notes arthritis in neck and hands Tylenol helps some Not that bad  Pacemaker is checked regularly May need new battery soon Does get occasional palpitations-- fast for very brief time (not lately). Mostly with exertion Very little atrial fib apparently  May get DOE --just rests No chest pain Chronic edema is stable  No depression  No anxiety Doing well on mirtazapine---without this she gets depressed (more worrying)  Lives alone Never drove Shops with daughter Does all instrumental ADLs  Current Outpatient Prescriptions on File Prior to Visit  Medication Sig Dispense Refill  . acetaminophen (TYLENOL) 500 MG tablet Take 500 mg by mouth as needed.      . cholecalciferol (VITAMIN D) 1000 UNITS tablet Take 1,000 Units by mouth daily.    . hydrochlorothiazide (HYDRODIURIL) 12.5 MG tablet Take 1 tablet (12.5 mg total) by mouth daily. 90 tablet 3  . levothyroxine (SYNTHROID, LEVOTHROID) 50 MCG tablet Take 1 tablet (50 mcg  total) by mouth daily  before breakfast. 15 tablet 0  . lisinopril (PRINIVIL,ZESTRIL) 20 MG tablet Take 1 tablet (20 mg total) by mouth daily. 90 tablet 3  . mirtazapine (REMERON) 15 MG tablet Take 1 tablet by mouth at  bedtime 90 tablet 3  . nitroGLYCERIN (NITROSTAT) 0.4 MG SL tablet Place 0.4 mg under the tongue every 5 (five) minutes as needed.      Marland Kitchen omeprazole (PRILOSEC) 20 MG capsule Take 1 capsule (20 mg total) by mouth 2 (two) times daily before a meal. 180 capsule 3  . sotalol (BETAPACE) 120 MG tablet Take 0.5 tablets (60 mg total) by mouth 2 (two) times daily. 90 tablet 3  . trimethoprim (TRIMPEX) 100 MG tablet Take 1 tablet by mouth  daily 90 tablet 3  . warfarin (COUMADIN) 1 MG tablet TAKE AS DIRECTED BY ANTI-COAGULATION CLINIC. 70 tablet 0  . warfarin (COUMADIN) 4 MG tablet Take 1 to 2  tablets by  mouth daily as directed. 180 tablet 3   No current facility-administered medications on file prior to visit.    Allergies  Allergen Reactions  . Amoxicillin Diarrhea    Weakness and atrial fib  . Augmentin [Amoxicillin-Pot Clavulanate] Diarrhea    Weakness and atrial fib  . Sulfonamide Derivatives     Past Medical History  Diagnosis Date  . Atrial fibrillation ([place])   . Hyperlipidemia   . Hypertension   . Hypothyroidism     after Rx for thyroid cancer  . Osteoarthritis   . Sleep disturbance   . Urinary incontinence     usually from UTI's  . Mitral regurgitation   . Depression   . Pacemaker   . GERD (gastroesophageal reflux disease)     Past Surgical History  Procedure Laterality Date  . Tonsillectomy and adenoidectomy  1937  . Vesicovaginal fistula closure w/ tah  1972  . Goiter removed  1976    cancerous. Then got RAI  . Breast lumpectomy  1983  . Breast biopsy  2002  . Pacemaker insertion  5/07    after syncope  . Cataract extraction, bilateral      Family History  Problem Relation Age of Onset  . Diabetes Mother   . Stroke Sister   . Coronary artery disease Brother   . Prostate cancer Brother   . Breast cancer Neg  Hx   . Colon cancer Neg Hx     Social History   Social History  . Marital Status: Widowed    Spouse Name: N/A  . Number of Children: 2  . Years of Education: N/A   Occupational History  . homemaker    Social History Main Topics  . Smoking status: Never Smoker   . Smokeless tobacco: Never Used  . Alcohol Use: No  . Drug Use: No  . Sexual Activity: Not on file   Other Topics Concern  . Not on file   Social History Narrative   Widowed 2002. 1 local daughter. 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    Daughter [name]) is health care POA.    Discussed DNR and she requests    Would not want tube feeds if cognitively unaware   Review of Systems Notes RLS symptoms--not taking  iron now Some fatigue a few weeks ago--daughter wonders about the thyroid Appetite is good Weight is stable    Objective:   Physical Exam  Constitutional: She appears well-developed. No distress.  Neck: Normal range of motion. Neck supple. No thyromegaly present.  Cardiovascular: Normal rate, regular rhythm and normal heart sounds.  Exam reveals no gallop.   No murmur heard. Pulmonary/Chest: Effort normal and breath sounds normal. No respiratory distress. She has no wheezes. She has no rales.  Musculoskeletal: She exhibits no edema or tenderness.  Lymphadenopathy:    She has no cervical adenopathy.  Psychiatric: She has a normal mood and affect. Her behavior is normal.          Assessment & Plan:

## 2015-08-08 NOTE — Telephone Encounter (Signed)
LMOVM reminding pt to send remote transmission.   

## 2015-08-11 ENCOUNTER — Other Ambulatory Visit: Payer: Self-pay

## 2015-08-11 LAB — PTH, INTACT AND CALCIUM
Calcium: 9.1 mg/dL (ref 8.4–10.5)
PTH: 104 pg/mL — AB (ref 14–64)

## 2015-08-11 MED ORDER — LEVOTHYROXINE SODIUM 75 MCG PO TABS: 75.0000 ug | ORAL_TABLET | Freq: Every day | ORAL | Status: DC

## 2015-08-11 NOTE — Telephone Encounter (Signed)
Patient's lab results showed elevated PTH so Dr Silvio Pate advised she increase to 2000 units of Vitamin D. Also, the TSH was on a low sode. So, Dr Silvio Pate is changing it to 44mcg. That was sent to [facility]

## 2015-08-19 ENCOUNTER — Other Ambulatory Visit: Payer: Self-pay

## 2015-08-19 MED ORDER — OMEPRAZOLE 20 MG PO CPDR: 20.0000 mg | DELAYED_RELEASE_CAPSULE | Freq: Two times a day (BID) | ORAL | Status: DC

## 2015-08-19 NOTE — Telephone Encounter (Signed)
Rx sent electronically.  

## 2015-08-21 ENCOUNTER — Ambulatory Visit (INDEPENDENT_AMBULATORY_CARE_PROVIDER_SITE_OTHER): Payer: Medicare Other | Admitting: *Deleted

## 2015-08-21 DIAGNOSIS — Z5181 Encounter for therapeutic drug level monitoring: Secondary | ICD-10-CM

## 2015-08-21 LAB — POCT INR: INR: 1.6

## 2015-08-21 NOTE — Progress Notes (Signed)
Pre visit review using our clinic review tool, if applicable. No additional management support is needed unless otherwise documented below in the visit note. 

## 2015-08-21 NOTE — Progress Notes (Signed)
INR remains sub therapeutic despite recent increase in dose.  Patient denies any missed doses or diet changes.  Patient instructed to take 4 mg today to boost, then to begin taking 3 mg daily except 4 mg on Mondays only.  Dicussed this with her daughter as well.  Patient encouraged to avoid greens for the next 3-4 days.  Will recheck in 3 weeks and make additional adjustments at that time if needed.

## 2015-08-22 ENCOUNTER — Encounter: Payer: Self-pay | Admitting: Internal Medicine

## 2015-08-22 ENCOUNTER — Ambulatory Visit (INDEPENDENT_AMBULATORY_CARE_PROVIDER_SITE_OTHER): Payer: Medicare Other | Admitting: Nurse Practitioner

## 2015-08-22 ENCOUNTER — Encounter: Payer: Self-pay | Admitting: Nurse Practitioner

## 2015-08-22 ENCOUNTER — Encounter: Payer: Self-pay | Admitting: *Deleted

## 2015-08-22 VITALS — BP 128/68 | HR 71 | Ht 62.0 in | Wt 127.2 lb

## 2015-08-22 DIAGNOSIS — I10 Essential (primary) hypertension: Secondary | ICD-10-CM | POA: Diagnosis not present

## 2015-08-22 DIAGNOSIS — I480 Paroxysmal atrial fibrillation: Secondary | ICD-10-CM | POA: Diagnosis not present

## 2015-08-22 DIAGNOSIS — I495 Sick sinus syndrome: Secondary | ICD-10-CM | POA: Diagnosis not present

## 2015-08-22 NOTE — Progress Notes (Signed)
Electrophysiology Office Note Date: 08/22/2015  ID:  Amanda [name], DOB [birth year], MRN [id number]  PCP: Viviana Simpler, MD Electrophysiologist: Caryl Comes  CC: Pacemaker follow-up  Amanda [name] is a 82 y.o. female seen today for Dr Caryl Comes.  She presents today for routine electrophysiology followup.  Since last being seen in our clinic, the patient reports doing very well. Her pacemaker has reached ERI and she presents today to discuss generator change.  She denies chest pain, palpitations, dyspnea, PND, orthopnea, nausea, vomiting, dizziness, syncope, edema, weight gain, or early satiety. She has noticed some decrease in energy level since reaching ERI.   Device History: MDT dual chamber PPM implanted 2007 for sick sinus syndrome   Past Medical History  Diagnosis Date  . Atrial fibrillation ([place])   . Hyperlipidemia   . Hypertension   . Hypothyroidism     after Rx for thyroid cancer  . Osteoarthritis   . Sleep disturbance   . Urinary incontinence     usually from UTI's  . Mitral regurgitation   . Depression   . Pacemaker   . GERD (gastroesophageal reflux disease)    Past Surgical History  Procedure Laterality Date  . Tonsillectomy and adenoidectomy  1937  . Vesicovaginal fistula closure w/ tah  1972  . Goiter removed  1976    cancerous. Then got RAI  . Breast lumpectomy  1983  . Breast biopsy  2002  . Pacemaker insertion  5/07    after syncope  . Cataract extraction, bilateral      Current Outpatient Prescriptions  Medication Sig Dispense Refill  . acetaminophen (TYLENOL) 500 MG tablet Take 500 mg by mouth as needed.      . cholecalciferol (VITAMIN D) 1000 UNITS tablet Take 1,000 Units by mouth daily.    . hydrochlorothiazide (HYDRODIURIL) 12.5 MG tablet Take 1 tablet (12.5 mg total) by mouth daily. 90 tablet 3  . levothyroxine (SYNTHROID, LEVOTHROID) 75 MCG tablet Take 1 tablet (75 mcg total) by mouth daily. 90 tablet 3  . lisinopril (PRINIVIL,ZESTRIL) 20 MG tablet Take  1 tablet (20 mg total) by mouth daily. 90 tablet 3  . mirtazapine (REMERON) 15 MG tablet Take 1 tablet by mouth at  bedtime 90 tablet 3  . nitroGLYCERIN (NITROSTAT) 0.4 MG SL tablet Place 0.4 mg under the tongue every 5 (five) minutes as needed for chest pain (x 3 doses).     Marland Kitchen omeprazole (PRILOSEC) 20 MG capsule Take 1 capsule (20 mg total) by mouth 2 (two) times daily before a meal. 180 capsule 3  . sotalol (BETAPACE) 120 MG tablet Take 0.5 tablets (60 mg total) by mouth 2 (two) times daily. 90 tablet 3  . trimethoprim (TRIMPEX) 100 MG tablet Take 1 tablet by mouth  daily 90 tablet 3  . warfarin (COUMADIN) 1 MG tablet TAKE AS DIRECTED BY ANTI-COAGULATION CLINIC. 70 tablet 0  . warfarin (COUMADIN) 4 MG tablet Take 1 to 2 tablets by  mouth daily as directed. 180 tablet 3   No current facility-administered medications for this visit.    Allergies:   Amoxicillin; Augmentin; and Sulfonamide derivatives   Social History: Social History   Social History  . Marital Status: Widowed    Spouse Name: N/A  . Number of Children: 2  . Years of Education: N/A   Occupational History  . homemaker    Social History Main Topics  . Smoking status: Never Smoker   . Smokeless tobacco: Never Used  . Alcohol Use: No  .  Drug Use: No  . Sexual Activity: Not on file   Other Topics Concern  . Not on file   Social History Narrative   Widowed 2002. 1 local daughter. Other in [place]. Was homemaker. Farmed and gardened. 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    Daughter [name]) is health care POA.    Discussed DNR and she requests    Would not want tube feeds if cognitively unaware    Family History: Family History  Problem Relation Age of Onset  . Diabetes Mother   . Stroke Sister   . Coronary artery disease Brother   . Prostate cancer Brother   . Breast cancer Neg Hx   . Colon cancer Neg Hx      Review of Systems: All other systems reviewed and are otherwise negative except as noted  above.   Physical Exam: VS:  BP 128/68 mmHg  Pulse 71  Ht 5\' 2"  (1.575 m)  Wt 127 lb 3.2 oz (57.698 kg)  BMI 23.26 kg/m2 , BMI Body mass index is 23.26 kg/(m^2).  GEN- The patient is elderly appearing, alert and oriented x 3 today.   HEENT: normocephalic, atraumatic; sclera clear, conjunctiva pink; hearing intact; oropharynx clear; neck supple  Lungs- Clear to ausculation bilaterally, normal work of breathing.  No wheezes, rales, rhonchi Heart- Regular rate and rhythm  GI- soft, non-tender, non-distended, bowel sounds present  Extremities- no clubbing, cyanosis, or edema; DP/PT/radial pulses 2+ bilaterally MS- no significant deformity or atrophy Skin- warm and dry, no rash or lesion; PPM pocket well healed Psych- euthymic mood, full affect Neuro- strength and sensation are intact  PPM Interrogation- reviewed in detail today,  See PACEART report  EKG:  EKG is ordered today. The ekg ordered today shows sinus rhythm, QTc 399  Recent Labs: 04/29/2015: Magnesium 1.7 08/08/2015: BUN 31*; Creatinine, Ser 1.78*; Hemoglobin 10.1*; Platelets 234.0; Potassium 4.0; Sodium 141; TSH 30.20*   Wt Readings from Last 3 Encounters:  08/22/15 127 lb 3.2 oz (57.698 kg)  08/08/15 128 lb (58.06 kg)  05/05/15 126 lb (57.153 kg)     Other studies Reviewed: Additional studies/ records that were reviewed today include: Dr Olin Pia office notes  Assessment and Plan:  1.  Sick sinus syndrome PPM at ERI Risks, benefits to PPM gen change reviewed with patient who wishes to proceed. Will schedule with Dr Caryl Comes at the next available time Pre-procedure labs with Dr Silvio Pate 09/11/15 (INR check already scheduled for that day)  2.  Paroxysmal atrial fibrillation Continue Warfarin for CHADS2VASC of 4 Continue Sotalol for rhythm control K, QTc, creat stable  3.  HTN Stable No change required today   Current medicines are reviewed at length with the patient today.   The patient does not have concerns  regarding her medicines.  The following changes were made today:  none  Labs/ tests ordered today include: none   Disposition:   Follow up with Dr Caryl Comes after gen change   Signed, Chanetta Marshall, NP 08/22/2015 2:26 PM  [facility] [address] [place] [address] [phone] (office) [phone] (fax)

## 2015-08-22 NOTE — Patient Instructions (Signed)
Medication Instructions:   Your physician recommends that you continue on your current medications as directed. Please refer to the Current Medication list given to you today.   If you need a refill on your cardiac medications before your next appointment, please call your pharmacy.  Labwork: NONE ORDER TODAY    Testing/Procedures:  SEE LETTER FOR PACER GEN CHANGE ON 09/17/15    Follow-Up: [place] WITH DEVICE CLINIC  AFTER 09/17/15    90 DAY [employer] WITH DR Caryl Comes   AFTER 09/17/15.Marland Kitchen WEEK OF 11/24/2015.. OR WEEK BEFORE THAT COVERS 90 DAY TIME FRAME  Any Other Special Instructions Will Be Listed Below (If Applicable).

## 2015-09-01 ENCOUNTER — Telehealth: Payer: Self-pay | Admitting: Internal Medicine

## 2015-09-01 NOTE — Telephone Encounter (Signed)
Follow Up:    Daughter calling again,mother is not feeling well at all,she has no energy. She would like the procedure to be moved up asap.At this point any doctor that Dr Caryl Comes recommends to do the procedure will be acceptable.Please call her asap.

## 2015-09-01 NOTE — Telephone Encounter (Signed)
New message       The daughter is calling for the pt to have the procedure canceled and rescheduled cause she feel that the pt needs it sooner. Please return the call on the home number or daughter's cell number.

## 2015-09-01 NOTE — Telephone Encounter (Signed)
Spoke with daughter. Increased fatigue since I saw her in office last. Will move PPM gen change up to tomorrow with Dr Rayann Heman as Dr Caryl Comes doesn't have lab availability until 5/31.  Pt to arrive at 12Noon. NPO after clear liquid breakfast.  Will need labs drawn at hospital.  Hold Coumadin tonight.  Daughter aware and agrees with plan.  Chanetta Marshall, NP 09/01/2015 11:02 AM

## 2015-09-02 ENCOUNTER — Encounter (HOSPITAL_COMMUNITY)
Admission: RE | Disposition: A | Discharge status: Home / Self Care | Payer: Self-pay | Source: Ambulatory Visit | Attending: Internal Medicine

## 2015-09-02 ENCOUNTER — Ambulatory Visit (HOSPITAL_COMMUNITY)
Admission: RE | Admit: 2015-09-02 | Discharge: 2015-09-02 | Disposition: A | Discharge status: Home / Self Care | Payer: Medicare Other | Source: Ambulatory Visit | Attending: Internal Medicine | Admitting: Internal Medicine

## 2015-09-02 DIAGNOSIS — Z45010 Encounter for checking and testing of cardiac pacemaker pulse generator [battery]: Secondary | ICD-10-CM | POA: Diagnosis not present

## 2015-09-02 DIAGNOSIS — M1990 Unspecified osteoarthritis, unspecified site: Secondary | ICD-10-CM | POA: Diagnosis not present

## 2015-09-02 DIAGNOSIS — F329 Major depressive disorder, single episode, unspecified: Secondary | ICD-10-CM | POA: Insufficient documentation

## 2015-09-02 DIAGNOSIS — E785 Hyperlipidemia, unspecified: Secondary | ICD-10-CM | POA: Diagnosis not present

## 2015-09-02 DIAGNOSIS — K219 Gastro-esophageal reflux disease without esophagitis: Secondary | ICD-10-CM | POA: Diagnosis not present

## 2015-09-02 DIAGNOSIS — Z880 Allergy status to penicillin: Secondary | ICD-10-CM | POA: Insufficient documentation

## 2015-09-02 DIAGNOSIS — Z7901 Long term (current) use of anticoagulants: Secondary | ICD-10-CM | POA: Diagnosis not present

## 2015-09-02 DIAGNOSIS — Z85850 Personal history of malignant neoplasm of thyroid: Secondary | ICD-10-CM | POA: Diagnosis not present

## 2015-09-02 DIAGNOSIS — Z882 Allergy status to sulfonamides status: Secondary | ICD-10-CM | POA: Diagnosis not present

## 2015-09-02 DIAGNOSIS — I495 Sick sinus syndrome: Secondary | ICD-10-CM | POA: Diagnosis not present

## 2015-09-02 DIAGNOSIS — I480 Paroxysmal atrial fibrillation: Secondary | ICD-10-CM | POA: Insufficient documentation

## 2015-09-02 DIAGNOSIS — I340 Nonrheumatic mitral (valve) insufficiency: Secondary | ICD-10-CM | POA: Insufficient documentation

## 2015-09-02 DIAGNOSIS — I10 Essential (primary) hypertension: Secondary | ICD-10-CM | POA: Insufficient documentation

## 2015-09-02 DIAGNOSIS — E039 Hypothyroidism, unspecified: Secondary | ICD-10-CM | POA: Diagnosis not present

## 2015-09-02 HISTORY — PX: EP IMPLANTABLE DEVICE: SHX172B

## 2015-09-02 LAB — CBC
HEMATOCRIT: 31.7 % — AB (ref 36.0–46.0)
HEMOGLOBIN: 10.5 g/dL — AB (ref 12.0–15.0)
MCH: 28.5 pg (ref 26.0–34.0)
MCHC: 33.1 g/dL (ref 30.0–36.0)
MCV: 85.9 fL (ref 78.0–100.0)
Platelets: 239 10*3/uL (ref 150–400)
RBC: 3.69 MIL/uL — AB (ref 3.87–5.11)
RDW: 15.2 % (ref 11.5–15.5)
WBC: 7 10*3/uL (ref 4.0–10.5)

## 2015-09-02 LAB — BASIC METABOLIC PANEL
ANION GAP: 11 (ref 5–15)
BUN: 52 mg/dL — ABNORMAL HIGH (ref 6–20)
CHLORIDE: 103 mmol/L (ref 101–111)
CO2: 24 mmol/L (ref 22–32)
Calcium: 9.1 mg/dL (ref 8.9–10.3)
Creatinine, Ser: 2.6 mg/dL — ABNORMAL HIGH (ref 0.44–1.00)
GFR calc Af Amer: 17 mL/min — ABNORMAL LOW (ref >60)
GFR, EST NON AFRICAN AMERICAN: 15 mL/min — AB (ref >60)
GLUCOSE: 110 mg/dL — AB (ref 65–99)
Potassium: 4.3 mmol/L (ref 3.5–5.1)
Sodium: 138 mmol/L (ref 135–145)

## 2015-09-02 LAB — PROTIME-INR
INR: 2.27 — ABNORMAL HIGH (ref 0.00–1.49)
Prothrombin Time: 24.8 seconds — ABNORMAL HIGH (ref 11.6–15.2)

## 2015-09-02 LAB — SURGICAL PCR SCREEN
MRSA, PCR: NEGATIVE
STAPHYLOCOCCUS AUREUS: NEGATIVE

## 2015-09-02 SURGERY — PPM/BIV PPM GENERATOR CHANGEOUT
Anesthesia: LOCAL

## 2015-09-02 MED ORDER — GENTAMICIN SULFATE 40 MG/ML IJ SOLN
80.0000 mg | INTRAMUSCULAR | Status: AC
  Administered 2015-09-02: 80 mg

## 2015-09-02 MED ORDER — MIDAZOLAM HCL 5 MG/5ML IJ SOLN
INTRAMUSCULAR | Status: AC
  Filled 2015-09-02: qty 5

## 2015-09-02 MED ORDER — ONDANSETRON HCL 4 MG/2ML IJ SOLN: 4.0000 mg | Freq: Four times a day (QID) | INTRAMUSCULAR | Status: DC | PRN

## 2015-09-02 MED ORDER — SODIUM CHLORIDE 0.9 % IR SOLN
Status: AC
  Filled 2015-09-02: qty 2

## 2015-09-02 MED ORDER — SODIUM CHLORIDE 0.9 % IV SOLN
INTRAVENOUS | Status: DC
  Administered 2015-09-02: 12:00:00 via INTRAVENOUS

## 2015-09-02 MED ORDER — LIDOCAINE HCL (PF) 1 % IJ SOLN
INTRAMUSCULAR | Status: AC
  Filled 2015-09-02: qty 60

## 2015-09-02 MED ORDER — VANCOMYCIN HCL IN DEXTROSE 1-5 GM/200ML-% IV SOLN
1000.0000 mg | INTRAVENOUS | Status: AC
  Administered 2015-09-02: 1000 mg via INTRAVENOUS

## 2015-09-02 MED ORDER — MUPIROCIN 2 % EX OINT
1.0000 "application " | TOPICAL_OINTMENT | Freq: Once | CUTANEOUS | Status: AC
  Administered 2015-09-02: 1 via TOPICAL
  Filled 2015-09-02: qty 22

## 2015-09-02 MED ORDER — FENTANYL CITRATE (PF) 100 MCG/2ML IJ SOLN
INTRAMUSCULAR | Status: AC
  Filled 2015-09-02: qty 2

## 2015-09-02 MED ORDER — ACETAMINOPHEN 325 MG PO TABS
325.0000 mg | ORAL_TABLET | ORAL | Status: DC | PRN
Start: 2015-09-02 — End: 2015-09-02

## 2015-09-02 MED ORDER — VANCOMYCIN HCL IN DEXTROSE 1-5 GM/200ML-% IV SOLN
INTRAVENOUS | Status: AC
  Filled 2015-09-02: qty 200

## 2015-09-02 MED ORDER — LIDOCAINE HCL (PF) 1 % IJ SOLN
INTRAMUSCULAR | Status: DC | PRN
  Administered 2015-09-02: 22 mL

## 2015-09-02 MED ORDER — CHLORHEXIDINE GLUCONATE 4 % EX LIQD: 60.0000 mL | Freq: Once | CUTANEOUS | Status: DC

## 2015-09-02 MED ORDER — MUPIROCIN 2 % EX OINT
TOPICAL_OINTMENT | CUTANEOUS | Status: AC
  Filled 2015-09-02: qty 22

## 2015-09-02 SURGICAL SUPPLY — 5 items
CABLE SURGICAL S-101-97-12 (CABLE) ×2 IMPLANT
PACEMAKER ADAPTA DR ADDR01 (Pacemaker) ×1 IMPLANT
PAD DEFIB LIFELINK (PAD) ×2 IMPLANT
PPM ADAPTA DR ADDR01 (Pacemaker) ×2 IMPLANT
TRAY PACEMAKER INSERTION (PACKS) ×2 IMPLANT

## 2015-09-02 NOTE — Discharge Instructions (Signed)

## 2015-09-02 NOTE — H&P (Signed)
ID: Amanda [name], DOB [birth year], MRN [id number]  PCP: Viviana Simpler, MD Electrophysiologist: Caryl Comes  CC: Pacemaker follow-up  Amanda [name] is a 82 y.o. female seen today for Dr Caryl Comes. She presents today for routine electrophysiology followup. Since last being seen in our clinic, the patient reports doing very well. Her pacemaker has reached ERI and she presents today to discuss generator change. She denies chest pain, palpitations, dyspnea, PND, orthopnea, nausea, vomiting, dizziness, syncope, edema, weight gain, or early satiety. She has noticed some decrease in energy level since reaching ERI.   Device History: MDT dual chamber PPM implanted 2007 for sick sinus syndrome   Past Medical History  Diagnosis Date  . Atrial fibrillation ([place])   . Hyperlipidemia   . Hypertension   . Hypothyroidism     after Rx for thyroid cancer  . Osteoarthritis   . Sleep disturbance   . Urinary incontinence     usually from UTI's  . Mitral regurgitation   . Depression   . Pacemaker   . GERD (gastroesophageal reflux disease)    Past Surgical History  Procedure Laterality Date  . Tonsillectomy and adenoidectomy  1937  . Vesicovaginal fistula closure w/ tah  1972  . Goiter removed  1976    cancerous. Then got RAI  . Breast lumpectomy  1983  . Breast biopsy  2002  . Pacemaker insertion  5/07    after syncope  . Cataract extraction, bilateral      Current Outpatient Prescriptions  Medication Sig Dispense Refill  . acetaminophen (TYLENOL) 500 MG tablet Take 500 mg by mouth as needed.     . cholecalciferol (VITAMIN D) 1000 UNITS tablet Take 1,000 Units by mouth daily.    . hydrochlorothiazide (HYDRODIURIL) 12.5 MG tablet Take 1 tablet (12.5 mg total) by mouth daily. 90 tablet 3  . levothyroxine (SYNTHROID, LEVOTHROID) 75 MCG tablet Take 1 tablet (75 mcg total) by mouth daily. 90 tablet  3  . lisinopril (PRINIVIL,ZESTRIL) 20 MG tablet Take 1 tablet (20 mg total) by mouth daily. 90 tablet 3  . mirtazapine (REMERON) 15 MG tablet Take 1 tablet by mouth at bedtime 90 tablet 3  . nitroGLYCERIN (NITROSTAT) 0.4 MG SL tablet Place 0.4 mg under the tongue every 5 (five) minutes as needed for chest pain (x 3 doses).     Marland Kitchen omeprazole (PRILOSEC) 20 MG capsule Take 1 capsule (20 mg total) by mouth 2 (two) times daily before a meal. 180 capsule 3  . sotalol (BETAPACE) 120 MG tablet Take 0.5 tablets (60 mg total) by mouth 2 (two) times daily. 90 tablet 3  . trimethoprim (TRIMPEX) 100 MG tablet Take 1 tablet by mouth daily 90 tablet 3  . warfarin (COUMADIN) 1 MG tablet TAKE AS DIRECTED BY ANTI-COAGULATION CLINIC. 70 tablet 0  . warfarin (COUMADIN) 4 MG tablet Take 1 to 2 tablets by mouth daily as directed. 180 tablet 3   No current facility-administered medications for this visit.    Allergies: Amoxicillin; Augmentin; and Sulfonamide derivatives   Social History: Social History   Social History  . Marital Status: Widowed    Spouse Name: N/A  . Number of Children: 2  . Years of Education: N/A   Occupational History  . homemaker    Social History Main Topics  . Smoking status: Never Smoker   . Smokeless tobacco: Never Used  . Alcohol Use: No  . Drug Use: No  . Sexual Activity: Not on file   Other Topics Concern  . Not  on file   Social History Narrative   Widowed 2002. 1 local daughter. 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    Daughter [name]) is health care POA.    Discussed DNR and she requests    Would not want tube feeds if cognitively unaware    Family History: Family History  Problem Relation Age of Onset  . Diabetes Mother   . Stroke Sister   . Coronary artery disease Brother   . Prostate cancer  Brother   . Breast cancer Neg Hx   . Colon cancer Neg Hx      Review of Systems: All other systems reviewed and are otherwise negative except as noted above.   Physical Exam: VS: BP 128/68 mmHg  Pulse 71  Ht 5\' 2"  (1.575 m)  Wt 127 lb 3.2 oz (57.698 kg)  BMI 23.26 kg/m2 , BMI Body mass index is 23.26 kg/(m^2).  GEN- The patient is elderly appearing, alert and oriented x 3 today.  HEENT: normocephalic, atraumatic; sclera clear, conjunctiva pink; hearing intact; oropharynx clear; neck supple  Lungs- Clear to ausculation bilaterally, normal work of breathing. No wheezes, rales, rhonchi Heart- Regular rate and rhythm  GI- soft, non-tender, non-distended, bowel sounds present  Extremities- no clubbing, cyanosis, or edema; DP/PT/radial pulses 2+ bilaterally MS- no significant deformity or atrophy Skin- warm and dry, no rash or lesion; PPM pocket well healed Psych- euthymic mood, full affect Neuro- strength and sensation are intact  PPM Interrogation- reviewed in detail today, See PACEART report  EKG: EKG is ordered today. The ekg ordered today shows sinus rhythm, QTc 399  Recent Labs: 04/29/2015: Magnesium 1.7 08/08/2015: BUN 31*; Creatinine, Ser 1.78*; Hemoglobin 10.1*; Platelets 234.0; Potassium 4.0; Sodium 141; TSH 30.20*   Wt Readings from Last 3 Encounters:  08/22/15 127 lb 3.2 oz (57.698 kg)  08/08/15 128 lb (58.06 kg)  05/05/15 126 lb (57.153 kg)     Other studies Reviewed: Additional studies/ records that were reviewed today include: Dr Olin Pia office notes  Assessment and Plan:  1. Sick sinus syndrome PPM at ERI Risks, benefits to PPM gen change reviewed with patient who wishes to proceed. Will schedule with Dr Caryl Comes at the next available time Pre-procedure labs with Dr Silvio Pate 09/11/15 (INR check already scheduled for that day)  2. Paroxysmal atrial fibrillation Continue Warfarin for CHADS2VASC of 4 Continue Sotalol for rhythm  control K, QTc, creat stable  3. HTN Stable No change required today   Current medicines are reviewed at length with the patient today.  The patient does not have concerns regarding her medicines. The following changes were made today: none  Labs/ tests ordered today include: none   Disposition: Follow up with Dr Caryl Comes after gen change   Signed, Chanetta Marshall, NP 08/22/2015       EP Attending  Patient seen and examined. Agree with above. She feels poorly with VVI pacing at 65/min. Will plan to proceed with PPM generator change out. I have discussed the risks/benefits/goals/expectations of PM gen change and she wishes to proceed with generator change out.   Mikle Bosworth.D.

## 2015-09-03 ENCOUNTER — Encounter (HOSPITAL_COMMUNITY): Payer: Self-pay | Admitting: Internal Medicine

## 2015-09-11 ENCOUNTER — Ambulatory Visit (INDEPENDENT_AMBULATORY_CARE_PROVIDER_SITE_OTHER): Payer: Medicare Other | Admitting: *Deleted

## 2015-09-11 ENCOUNTER — Encounter: Payer: Self-pay | Admitting: Internal Medicine

## 2015-09-11 DIAGNOSIS — Z5181 Encounter for therapeutic drug level monitoring: Secondary | ICD-10-CM

## 2015-09-11 DIAGNOSIS — I495 Sick sinus syndrome: Secondary | ICD-10-CM | POA: Diagnosis not present

## 2015-09-11 DIAGNOSIS — I480 Paroxysmal atrial fibrillation: Secondary | ICD-10-CM | POA: Diagnosis not present

## 2015-09-11 LAB — CUP PACEART INCLINIC DEVICE CHECK
Brady Statistic AP VS Percent: 95 %
Brady Statistic AS VS Percent: 5 %
Date Time Interrogation Session: 20170525164825
Implantable Lead Implant Date: 20070524
Implantable Lead Location: 753859
Implantable Lead Model: 5568
Lead Channel Impedance Value: 666 Ohm
Lead Channel Pacing Threshold Amplitude: 0.5 V
Lead Channel Pacing Threshold Amplitude: 0.75 V
Lead Channel Pacing Threshold Amplitude: 0.75 V
Lead Channel Pacing Threshold Amplitude: 0.875 V
Lead Channel Pacing Threshold Pulse Width: 0.4 ms
Lead Channel Pacing Threshold Pulse Width: 0.4 ms
Lead Channel Sensing Intrinsic Amplitude: 2.8 mV
Lead Channel Setting Pacing Pulse Width: 0.4 ms
Lead Channel Setting Sensing Sensitivity: 2 mV
MDC IDC LEAD IMPLANT DT: 20070524
MDC IDC LEAD LOCATION: 753860
MDC IDC MSMT BATTERY IMPEDANCE: 100 Ohm
MDC IDC MSMT BATTERY REMAINING LONGEVITY: 150 mo
MDC IDC MSMT BATTERY VOLTAGE: 2.8 V
MDC IDC MSMT LEADCHNL RA PACING THRESHOLD PULSEWIDTH: 0.4 ms
MDC IDC MSMT LEADCHNL RV IMPEDANCE VALUE: 427 Ohm
MDC IDC MSMT LEADCHNL RV PACING THRESHOLD PULSEWIDTH: 0.4 ms
MDC IDC MSMT LEADCHNL RV SENSING INTR AMPL: 2.8 mV
MDC IDC SET LEADCHNL RA PACING AMPLITUDE: 1.5 V
MDC IDC SET LEADCHNL RV PACING AMPLITUDE: 2.5 V
MDC IDC STAT BRADY AP VP PERCENT: 0 %
MDC IDC STAT BRADY AS VP PERCENT: 0 %

## 2015-09-11 LAB — POCT INR: INR: 2.3

## 2015-09-11 NOTE — Progress Notes (Signed)
Pre visit review using our clinic review tool, if applicable. No additional management support is needed unless otherwise documented below in the visit note. 

## 2015-09-11 NOTE — Progress Notes (Signed)
Wound check appointment. Steri-strips removed. Wound without redness or edema. Incision edges approximated, wound well healed. Normal device function. Thresholds, sensing, and impedances consistent with implant measurements. Device programmed at appropriate safety margins. Histogram distribution appropriate for patient and level of activity. 5.1% AF burden + warfarin. No high ventricular rates noted. Patient will follow up with SK/B in 11-2015.

## 2015-09-13 LAB — CUP PACEART REMOTE DEVICE CHECK
Battery Impedance: 6188 Ohm
Battery Remaining Longevity: 0 mo
Battery Voltage: 2.6 V
Brady Statistic AP VS Percent: 97 %
Lead Channel Impedance Value: 445 Ohm
Lead Channel Setting Pacing Amplitude: 2 V
Lead Channel Setting Sensing Sensitivity: 2 mV
MDC IDC MSMT LEADCHNL RA IMPEDANCE VALUE: 901 Ohm
MDC IDC SESS DTM: 20170320125029
MDC IDC SET LEADCHNL RV PACING AMPLITUDE: 2.5 V
MDC IDC SET LEADCHNL RV PACING PULSEWIDTH: 0.4 ms
MDC IDC STAT BRADY AP VP PERCENT: 1 %
MDC IDC STAT BRADY AS VP PERCENT: 0 %
MDC IDC STAT BRADY AS VS PERCENT: 2 %

## 2015-09-13 NOTE — Progress Notes (Signed)
Carelink battery check only

## 2015-09-29 ENCOUNTER — Ambulatory Visit: Payer: Medicare Other

## 2015-10-10 ENCOUNTER — Ambulatory Visit (INDEPENDENT_AMBULATORY_CARE_PROVIDER_SITE_OTHER): Payer: Medicare Other | Admitting: Cardiovascular Disease

## 2015-10-10 ENCOUNTER — Encounter: Payer: Self-pay | Admitting: Cardiovascular Disease

## 2015-10-10 VITALS — BP 130/78 | HR 88 | Ht 62.0 in | Wt 125.5 lb

## 2015-10-10 DIAGNOSIS — I480 Paroxysmal atrial fibrillation: Secondary | ICD-10-CM

## 2015-10-10 DIAGNOSIS — I10 Essential (primary) hypertension: Secondary | ICD-10-CM

## 2015-10-10 DIAGNOSIS — N184 Chronic kidney disease, stage 4 (severe): Secondary | ICD-10-CM

## 2015-10-10 DIAGNOSIS — Z950 Presence of cardiac pacemaker: Secondary | ICD-10-CM

## 2015-10-10 DIAGNOSIS — I5032 Chronic diastolic (congestive) heart failure: Secondary | ICD-10-CM | POA: Diagnosis not present

## 2015-10-10 DIAGNOSIS — I059 Rheumatic mitral valve disease, unspecified: Secondary | ICD-10-CM | POA: Diagnosis not present

## 2015-10-10 NOTE — Progress Notes (Signed)
Patient ID: Amanda [name], female   DOB: May 29, 1919, 82 y.o.   MRN: [id number] Cardiology Office Note  Date:  10/10/2015   ID:  Amanda [name], DOB 10-26-1919, MRN [id number]  PCP:  Amanda Simpler, MD   Chief Complaint  Patient presents with  . other    6 month f/u c/o edema, weakness and sob. Meds reviewed verbally with pt.    HPI:  [name] is a pleasant 82 year old woman who has a history of paroxysmal atrial fibrillation, normal systolic function by echocardiogram in 2007 with MR, mild aortic valve regurgitation, mildly dilated left atrium, Holter monitor and April 2007 showing frequent and complex supraventricular arrhythmia, cardiac CTA showing no significant coronary artery disease with atherosclerotic plaque in the thoracic aorta who has been managed on sotalol for rhythm control, pacer placed in May 2007 with a history of "fainting "prior to that who presents for routine followup of her atrial fibrillation She has pacemaker in place  In follow-up today, she reports that she has been doing well. Recent pacemaker change out May 2017 for ERI Lab work done at that time showed drastic climb in her creatinine 2.6 No recent BMP Daughter reports that she was not feeling well prior to pacer change, likely was in VVI. She was not eating, drinking per the daughter  Since pacemaker change, has been feeling better, back to her baseline Does not do much exercise. Walks with a walker at times, no recent falls Does not use the facilities at [facility]  EKG on today's visit shows atrial paced rhythm with rate 88 bpm, nonspecific ST abnormality  Other past medical history several previous falls  Previous echocardiogram for shortness of breath echo showed mild LVH, diastolic relaxation abnormality, normal ejection fraction, mild to moderate MR, high normal right ventricular systolic pressures Cardiac catheterization in April 2007 showing no significant coronary artery disease  PMH:   has a  past medical history of Atrial fibrillation ([place]); Hyperlipidemia; Hypertension; Hypothyroidism; Osteoarthritis; Sleep disturbance; Urinary incontinence; Mitral regurgitation; Depression; Pacemaker; and GERD (gastroesophageal reflux disease).  PSH:    Past Surgical History  Procedure Laterality Date  . Tonsillectomy and adenoidectomy  1937  . Vesicovaginal fistula closure w/ tah  1972  . Goiter removed  1976    cancerous. Then got RAI  . Breast lumpectomy  1983  . Breast biopsy  2002  . Pacemaker insertion  5/07    after syncope  . Cataract extraction, bilateral    . Ep implantable device N/A 09/02/2015    Procedure: PPM Generator Changeout;  Surgeon: Evans Lance, MD;  Location: [facility];  Service: Cardiovascular;  Laterality: N/A;    Current Outpatient Prescriptions  Medication Sig Dispense Refill  . acetaminophen (TYLENOL) 500 MG tablet Take 500 mg by mouth every 8 (eight) hours as needed for mild pain.     . cholecalciferol (VITAMIN D) 1000 UNITS tablet Take 1,000 Units by mouth daily.    Marland Kitchen levothyroxine (SYNTHROID, LEVOTHROID) 75 MCG tablet Take 1 tablet (75 mcg total) by mouth daily. 90 tablet 3  . lisinopril (PRINIVIL,ZESTRIL) 20 MG tablet Take 1 tablet (20 mg total) by mouth daily. 90 tablet 3  . mirtazapine (REMERON) 15 MG tablet Take 1 tablet by mouth at  bedtime 90 tablet 3  . nitroGLYCERIN (NITROSTAT) 0.4 MG SL tablet Place 0.4 mg under the tongue every 5 (five) minutes as needed for chest pain (x 3 doses).     Marland Kitchen omeprazole (PRILOSEC) 20 MG capsule Take 1  capsule (20 mg total) by mouth 2 (two) times daily before a meal. (Patient taking differently: Take 20 mg by mouth daily. ) 180 capsule 3  . sotalol (BETAPACE) 120 MG tablet Take 0.5 tablets (60 mg total) by mouth 2 (two) times daily. 90 tablet 3  . trimethoprim (TRIMPEX) 100 MG tablet Take 1 tablet by mouth  daily 90 tablet 3  . warfarin (COUMADIN) 1 MG tablet TAKE AS DIRECTED BY ANTI-COAGULATION CLINIC. (Patient  taking differently: take 3 tablets (3mg ) everyday of the week except Monday) 70 tablet 0  . warfarin (COUMADIN) 4 MG tablet Take 1 to 2 tablets by  mouth daily as directed. (Patient taking differently: take 1 tablet by mouth on MOnday only) 180 tablet 3   No current facility-administered medications for this visit.     Allergies:   Amoxicillin; Augmentin; and Sulfonamide derivatives   Social History:  The patient  reports that she has never smoked. She has never used smokeless tobacco. She reports that she does not drink alcohol or use illicit drugs.   Family History:   family history includes Coronary artery disease in her brother; Diabetes in her mother; Prostate cancer in her brother; Stroke in her sister. There is no history of Breast cancer or Colon cancer.    Review of Systems: Review of Systems  Constitutional: Negative.   Respiratory: Negative.   Cardiovascular: Negative.   Gastrointestinal: Negative.   Musculoskeletal: Negative.   Neurological: Negative.   Psychiatric/Behavioral: Negative.   All other systems reviewed and are negative.    PHYSICAL EXAM: VS:  BP 130/78 mmHg  Pulse 88  Ht 5\' 2"  (1.575 m)  Wt 125 lb 8 oz (56.926 kg)  BMI 22.95 kg/m2 , BMI Body mass index is 22.95 kg/(m^2). GEN: Well nourished, well developed, in no acute distress HEENT: normal Neck: no JVD, carotid bruits, or masses Cardiac: RRR; no murmurs, rubs, or gallops,no edema  Respiratory:  clear to auscultation bilaterally, normal work of breathing GI: soft, nontender, nondistended, + BS MS: no deformity or atrophy Skin: warm and dry, no rash Neuro:  Strength and sensation are intact Psych: euthymic mood, full affect    Recent Labs: 04/29/2015: Magnesium 1.7 08/08/2015: TSH 30.20* 09/02/2015: BUN 52*; Creatinine, Ser 2.60*; Hemoglobin 10.5*; Platelets 239; Potassium 4.3; Sodium 138    Lipid Panel Lab Results  Component Value Date   CHOL 391* 06/22/2011   HDL 63.80 06/22/2011   TRIG  296.0* 06/22/2011      Wt Readings from Last 3 Encounters:  10/10/15 125 lb 8 oz (56.926 kg)  09/02/15 127 lb (57.607 kg)  08/22/15 127 lb 3.2 oz (57.698 kg)       ASSESSMENT AND PLAN:   Essential hypertension - Plan: EKG XX123456, Basic Metabolic Panel (BMET) Blood pressure is well controlled on today's visit.  Recommended she hold her HCTZ in the setting of renal failure  Paroxysmal atrial fibrillation ([place]) - Plan: EKG XX123456, Basic Metabolic Panel (BMET) Normal sinus rhythm, paced, on anticoagulation We'll continue sotalol  Chronic diastolic CHF (congestive heart failure) ([place]) Recommended that she hold her HCTZ given worsening renal failure This is likely acute event in the setting of VVI prior to pacemaker change out We will recheck BMP today  Chronic renal disease, stage IV ([place]) As above, will recheck renal function on today's visit  PPM-[employer]tr[employer] Recent hospital records reviewed for change out, lab work reviewed Doing well after her recent change out  Disposition:   F/U  6 months   Orders  Placed This Encounter  Procedures  . Basic Metabolic Panel (BMET)  . EKG 12-Lead   Long discussion with daughter on today's visit concerning renal dysfunction  Total encounter time more than 25 minutes  Greater than 50% was spent in counseling and coordination of care with the patient   Signed, Esmond Plants, M.D., Ph.D. 10/10/2015  [place], [place]

## 2015-10-10 NOTE — Patient Instructions (Addendum)
Medication Instructions:   Please stop the HCTZ (hydrochlorothiazide)  Labwork:  BMP today  Please drink more, soda, juice, water   Follow-Up: It was a pleasure seeing you in the office today. Please call us if you have new issues that need to be addressed before your next appt.  [phone]  Your physician wants you to follow-up in: 6 months.  You will receive a reminder letter in the mail two months in advance. If you don't receive a letter, please call our office to schedule the follow-up appointment.  If you need a refill on your cardiac medications before your next appointment, please call your pharmacy.

## 2015-10-11 LAB — BASIC METABOLIC PANEL
BUN/Creatinine Ratio: 20 (ref 12–28)
BUN: 35 mg/dL (ref 10–36)
CALCIUM: 9.1 mg/dL (ref 8.7–10.3)
CO2: 23 mmol/L (ref 18–29)
CREATININE: 1.76 mg/dL — AB (ref 0.57–1.00)
Chloride: 97 mmol/L (ref 96–106)
GFR calc Af Amer: 28 mL/min/{1.73_m2} — ABNORMAL LOW (ref >59)
GFR, EST NON AFRICAN AMERICAN: 25 mL/min/{1.73_m2} — AB (ref >59)
GLUCOSE: 86 mg/dL (ref 65–99)
Potassium: 4.3 mmol/L (ref 3.5–5.2)
Sodium: 139 mmol/L (ref 134–144)

## 2015-10-16 ENCOUNTER — Other Ambulatory Visit (INDEPENDENT_AMBULATORY_CARE_PROVIDER_SITE_OTHER): Payer: Medicare Other

## 2015-10-16 DIAGNOSIS — Z5181 Encounter for therapeutic drug level monitoring: Secondary | ICD-10-CM

## 2015-10-16 DIAGNOSIS — I480 Paroxysmal atrial fibrillation: Secondary | ICD-10-CM

## 2015-10-16 LAB — POCT INR: INR: 3.2

## 2015-10-29 DIAGNOSIS — H348332 Tributary (branch) retinal vein occlusion, bilateral, stable: Secondary | ICD-10-CM | POA: Diagnosis not present

## 2015-11-02 IMAGING — CR DG ANKLE COMPLETE 3+V*L*
4 series · 4 of 4 positions shown · non-contrast
Comparison: None.

CLINICAL DATA: Fall this morning.  Left ankle pain.

EXAM:
LEFT ANKLE COMPLETE - 3+ VIEW

[AP]
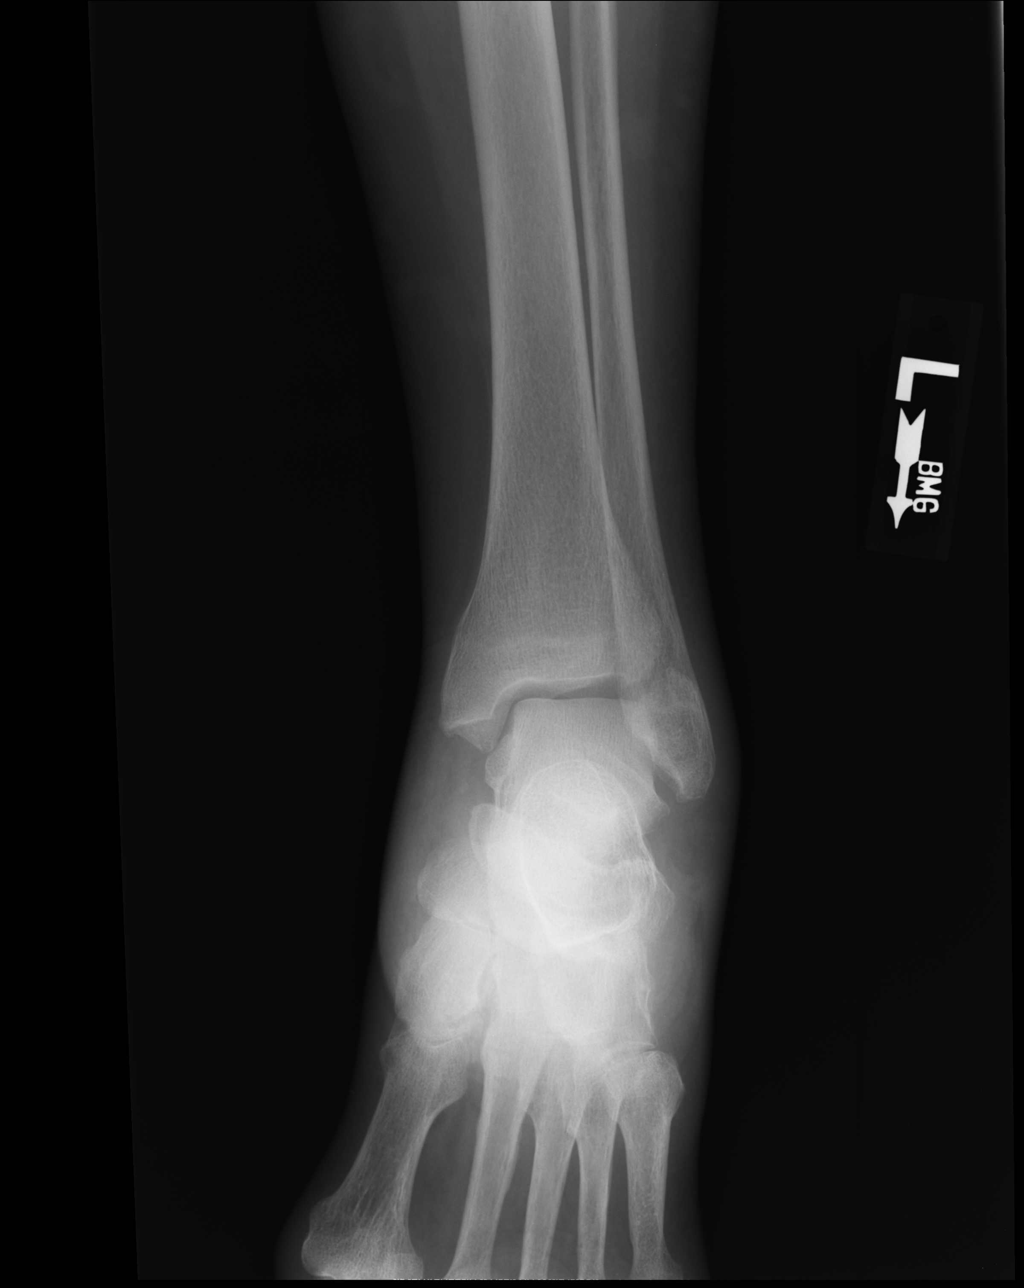

[ap obl int rot]
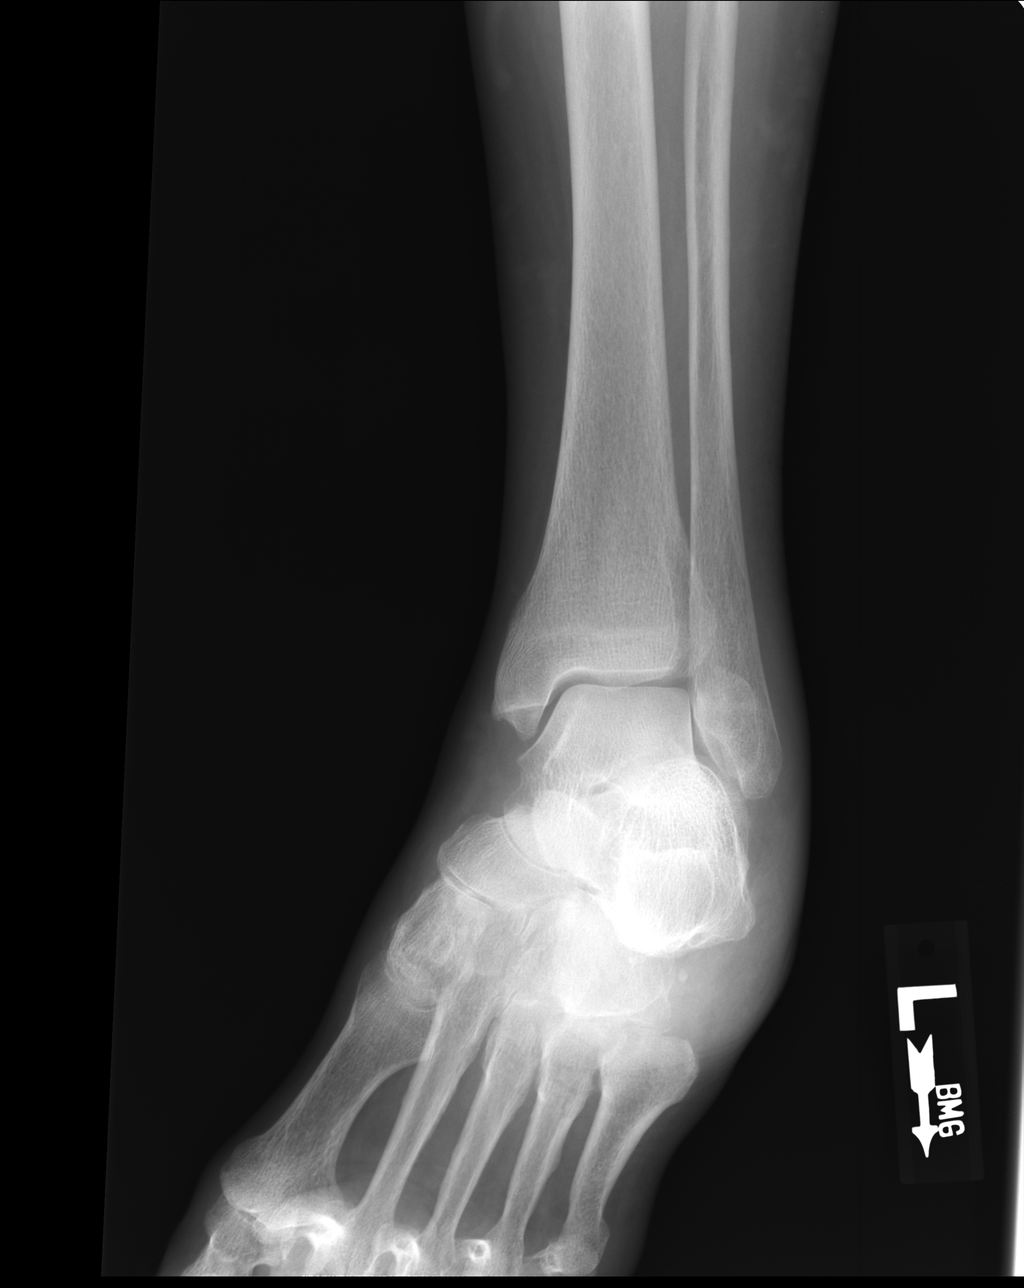

[medial obl]
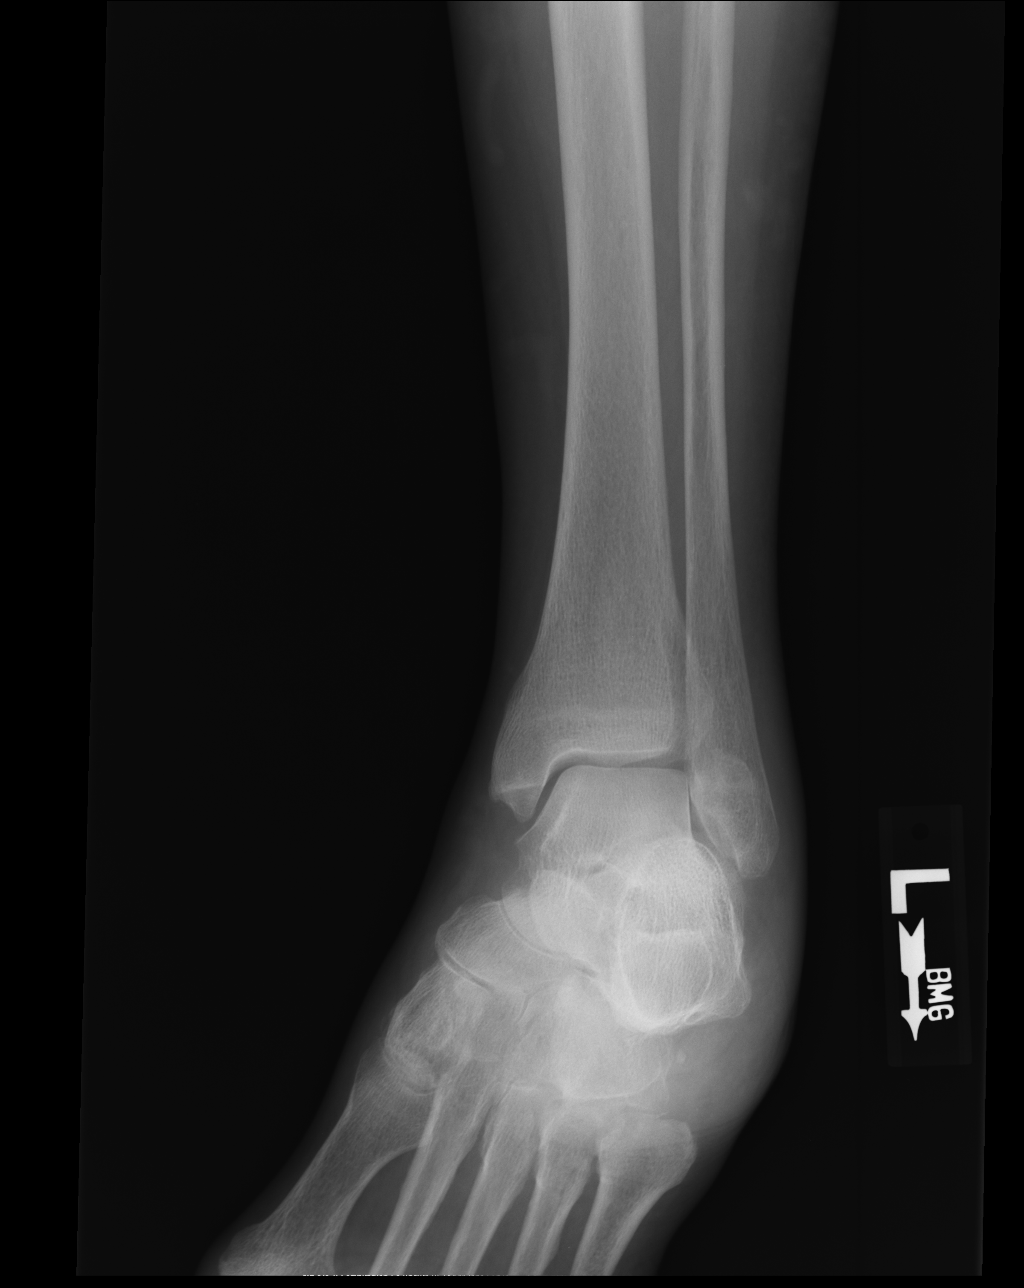

[lateral]
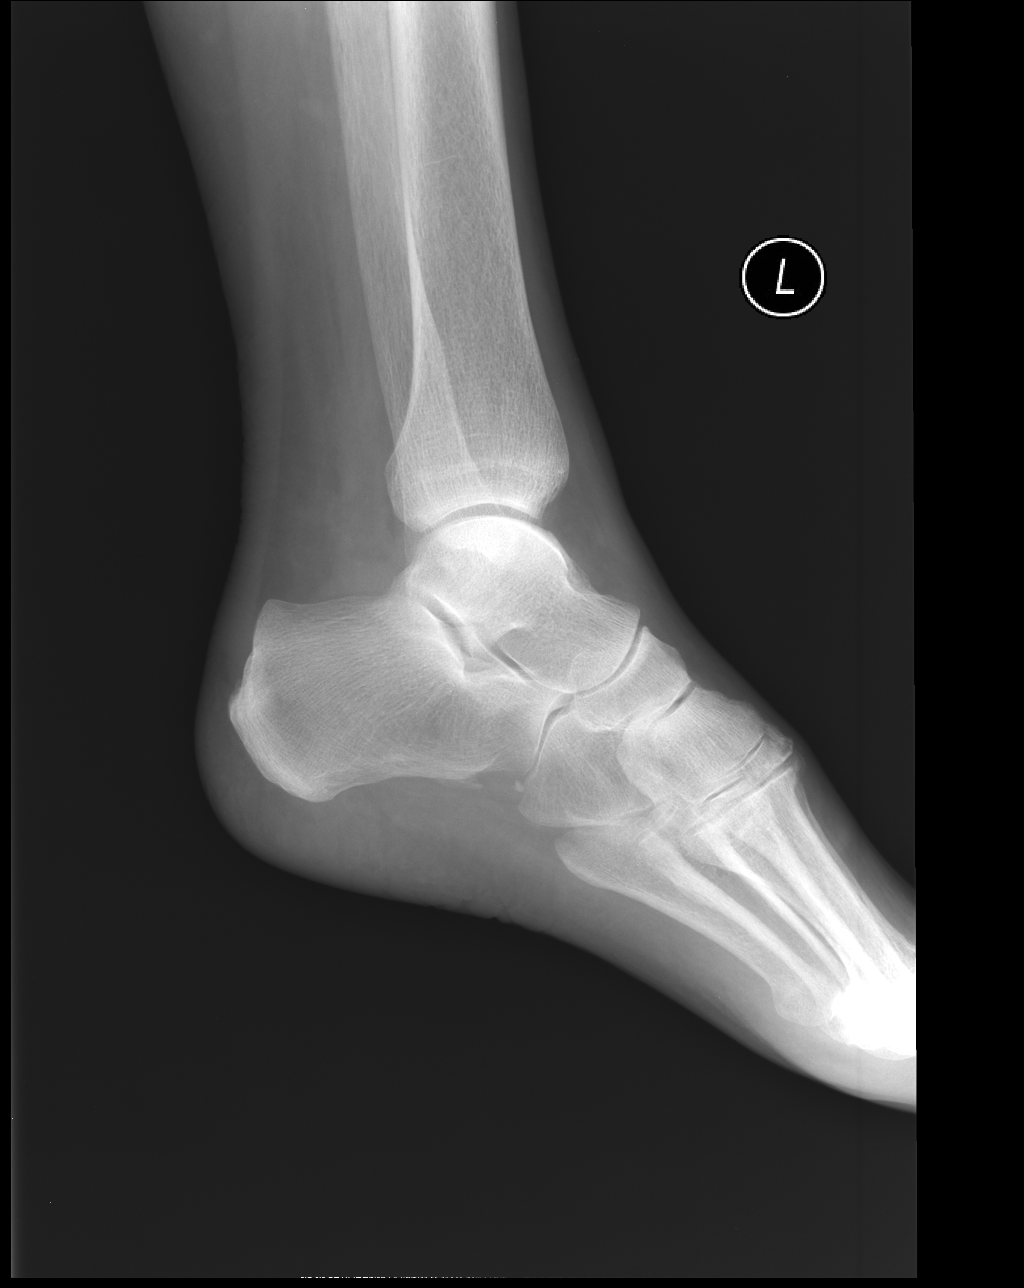

[4 of 4 positions shown; findings below may reference images not displayed]

FINDINGS: No fracture or dislocation. There is soft tissue swelling that is
diffuse but most prominent laterally.
IMPRESSION: No fracture or dislocation.

## 2015-11-10 ENCOUNTER — Other Ambulatory Visit (INDEPENDENT_AMBULATORY_CARE_PROVIDER_SITE_OTHER): Payer: Medicare Other

## 2015-11-10 ENCOUNTER — Encounter: Payer: Self-pay | Admitting: *Deleted

## 2015-11-10 DIAGNOSIS — I480 Paroxysmal atrial fibrillation: Secondary | ICD-10-CM | POA: Diagnosis not present

## 2015-11-10 DIAGNOSIS — Z5181 Encounter for therapeutic drug level monitoring: Secondary | ICD-10-CM | POA: Diagnosis not present

## 2015-11-10 LAB — POCT INR: INR: 2.5

## 2015-11-20 ENCOUNTER — Other Ambulatory Visit: Payer: Self-pay | Admitting: Internal Medicine

## 2015-11-20 NOTE — Telephone Encounter (Signed)
Rx sent electronically.  

## 2015-12-02 ENCOUNTER — Encounter: Payer: Medicare Other | Admitting: Internal Medicine

## 2015-12-08 ENCOUNTER — Other Ambulatory Visit (INDEPENDENT_AMBULATORY_CARE_PROVIDER_SITE_OTHER): Payer: Medicare Other

## 2015-12-08 DIAGNOSIS — Z5181 Encounter for therapeutic drug level monitoring: Secondary | ICD-10-CM

## 2015-12-08 DIAGNOSIS — I480 Paroxysmal atrial fibrillation: Secondary | ICD-10-CM | POA: Diagnosis not present

## 2015-12-08 LAB — POCT INR: INR: 2.3

## 2015-12-11 ENCOUNTER — Encounter: Payer: Medicare Other | Admitting: Internal Medicine

## 2015-12-23 ENCOUNTER — Ambulatory Visit (INDEPENDENT_AMBULATORY_CARE_PROVIDER_SITE_OTHER): Payer: Medicare Other | Admitting: Internal Medicine

## 2015-12-23 ENCOUNTER — Encounter: Payer: Self-pay | Admitting: Internal Medicine

## 2015-12-23 VITALS — BP 140/84 | HR 84 | Ht 62.0 in | Wt 130.0 lb

## 2015-12-23 DIAGNOSIS — I495 Sick sinus syndrome: Secondary | ICD-10-CM

## 2015-12-23 DIAGNOSIS — N2889 Other specified disorders of kidney and ureter: Secondary | ICD-10-CM

## 2015-12-23 DIAGNOSIS — Z950 Presence of cardiac pacemaker: Secondary | ICD-10-CM

## 2015-12-23 DIAGNOSIS — N184 Chronic kidney disease, stage 4 (severe): Secondary | ICD-10-CM

## 2015-12-23 DIAGNOSIS — I10 Essential (primary) hypertension: Secondary | ICD-10-CM

## 2015-12-23 DIAGNOSIS — I480 Paroxysmal atrial fibrillation: Secondary | ICD-10-CM

## 2015-12-23 MED ORDER — DILTIAZEM HCL ER COATED BEADS 120 MG PO CP24: 120.0000 mg | ORAL_CAPSULE | Freq: Every day | ORAL | 3 refills | Status: DC

## 2015-12-23 NOTE — Patient Instructions (Signed)
Medication Instructions: - Your physician has recommended you make the following change in your medication:  1) Stop lisinopril 2) Start diltiazem 120 mg once daily  Labwork: - none  Procedures/Testing: - none  Follow-Up: - Remote monitoring is used to monitor your Pacemaker of ICD from home. This monitoring reduces the number of office visits required to check your device to one time per year. It allows [place] to keep an eye on the functioning of your device to ensure it is working properly. You are scheduled for a device check from home on 03/23/16. You may send your transmission at any time that day. If you have a wireless device, the transmission will be sent automatically. After your physician reviews your transmission, you will receive a postcard with your next transmission date.  - Your physician wants you to follow-up in: 6 months with Dr. Caryl Comes. You will receive a reminder letter in the mail two months in advance. If you don't receive a letter, please call our office to schedule the follow-up appointment.   Any Additional Special Instructions Will Be Listed Below (If Applicable).     If you need a refill on your cardiac medications before your next appointment, please call your pharmacy.

## 2015-12-23 NOTE — Progress Notes (Signed)
Patient Care Team: Venia Carbon, MD as PCP - [place], MD as Consulting Physician (Cardiology)   HPI  Amanda [name] is a 82 y.o. female seen in followup with a previously implanted pacemaker in 2007 for syncope and apparently tachybradycardia syndrome; she has a history of paroxysmal atrial fibrillation managed with sotalol. She underwent generator replacement 5/17   At the last visit we decreased the sotalol on creatinine clearance.   She ahs had intermittent atrial fib which is assoc with GI symptoms and palpitations    2/15 Echo EF    65% mod MR   Her K was 4.0 10/16;  Mg 1.8  11 months ago,    5/17 Cr  2.6 6/17 Cr 1.67  Past Medical History:  Diagnosis Date  . Atrial fibrillation ([place])   . Depression   . GERD (gastroesophageal reflux disease)   . Hyperlipidemia   . Hypertension   . Hypothyroidism    after Rx for thyroid cancer  . Mitral regurgitation   . Osteoarthritis   . Pacemaker   . Sleep disturbance   . Urinary incontinence    usually from UTI's    Past Surgical History:  Procedure Laterality Date  . BREAST BIOPSY  2002  . BREAST LUMPECTOMY  1983  . CATARACT EXTRACTION, BILATERAL    . EP IMPLANTABLE DEVICE N/A 09/02/2015   Procedure: PPM Generator Changeout;  Surgeon: Evans Lance, MD;  Location: [facility];  Service: Cardiovascular;  Laterality: N/A;  . Goiter Removed  1976   cancerous. Then got RAI  . PACEMAKER INSERTION  5/07   after syncope  . TONSILLECTOMY AND ADENOIDECTOMY  1937  . VESICOVAGINAL FISTULA CLOSURE W/ TAH  1972    Current Outpatient Prescriptions  Medication Sig Dispense Refill  . acetaminophen (TYLENOL) 500 MG tablet Take 500 mg by mouth every 8 (eight) hours as needed for mild pain.     . cholecalciferol (VITAMIN D) 1000 UNITS tablet Take 1,000 Units by mouth daily.    Marland Kitchen levothyroxine (SYNTHROID, LEVOTHROID) 75 MCG tablet Take 1 tablet (75 mcg total) by mouth daily. 90 tablet 3  . mirtazapine  (REMERON) 15 MG tablet Take 1 tablet by mouth at  bedtime 90 tablet 3  . nitroGLYCERIN (NITROSTAT) 0.4 MG SL tablet Place 0.4 mg under the tongue every 5 (five) minutes as needed for chest pain (x 3 doses).     Marland Kitchen omeprazole (PRILOSEC) 20 MG capsule Take 1 capsule (20 mg total) by mouth 2 (two) times daily before a meal. (Patient taking differently: Take 20 mg by mouth daily. ) 180 capsule 3  . sotalol (BETAPACE) 120 MG tablet Take 0.5 tablets (60 mg total) by mouth 2 (two) times daily. 90 tablet 3  . trimethoprim (TRIMPEX) 100 MG tablet Take 1 tablet by mouth  daily 90 tablet 3  . warfarin (COUMADIN) 1 MG tablet Take as directed by  anticoagulation clinic 70 tablet 4  . warfarin (COUMADIN) 4 MG tablet Take 1 to 2 tablets by  mouth daily as directed. (Patient taking differently: take 1 tablet by mouth on MOnday only) 180 tablet 3  . diltiazem (CARDIZEM CD) 120 MG 24 hr capsule Take 1 capsule (120 mg total) by mouth daily. 30 capsule 3   No current facility-administered medications for this visit.     Allergies  Allergen Reactions  . Amoxicillin Diarrhea    Weakness and atrial fib  . Augmentin [Amoxicillin-Pot Clavulanate] Diarrhea  Weakness and atrial fib  . Sulfonamide Derivatives     unknown    Review of Systems negative except from HPI and PMH  Physical Exam BP 140/84 (BP Location: Left Arm, Patient Position: Sitting, Cuff Size: Normal)   Pulse 84   Ht 5\' 2"  (1.575 m)   Wt 130 lb (59 kg)   BMI 23.78 kg/m  Well developed and well nourished in no acute distress HENT normal  esotropia E scleral and icterus clear Neck Supple JVP flat; carotids brisk and full Device pocket well healed; without hematoma or erythema.  There is no tethering  Clear to ausculation  Regular rate and rhythm, 2/6 murmur Soft with active bowel sounds No clubbing cyanosis no  Edema swelling  Alert and oriented, grossly normal motor and sensory function Skin Warm and Dry    Assessment and   Plan  Atrial fibrillation with some undersensing  Pacemaker-[employer]onic  The patient's device was interrogated and the information was fully reviewed.  The device was reprogrammed to improve atrial sensing; her device is approaching ERI and we reviewed the symptoms potentially associated with reversion to VVI   High risk medication surveillance-sotalol  Renal insufficiency Gd 4  Hypertension    With her renal insufficieny will not adjust sotalol  Continues with Afib  Her GI symptoms seem to track with her Afib, the cause effect path not clear, ie which is first  Will change her ACE and use dilt, side effects reviewed, to see if we can accomplish rate control and minimize symptoms  BP reasonably controlled

## 2015-12-25 DIAGNOSIS — H903 Sensorineural hearing loss, bilateral: Secondary | ICD-10-CM | POA: Diagnosis not present

## 2015-12-25 DIAGNOSIS — H6123 Impacted cerumen, bilateral: Secondary | ICD-10-CM | POA: Diagnosis not present

## 2016-01-01 LAB — CUP PACEART INCLINIC DEVICE CHECK
Brady Statistic AP VP Percent: 0.3 %
Brady Statistic AP VS Percent: 96.8 %
Brady Statistic AS VP Percent: 0.1 %
Brady Statistic AS VS Percent: 2.9 %
Implantable Lead Implant Date: 20070524
Implantable Lead Implant Date: 20070524
Implantable Lead Location: 753860
Implantable Lead Model: 5076
Implantable Lead Model: 5568
Lead Channel Setting Pacing Amplitude: 2.5 V
MDC IDC LEAD LOCATION: 753859
MDC IDC SESS DTM: 20170914113200
MDC IDC SET LEADCHNL RA PACING AMPLITUDE: 1.5 V
MDC IDC SET LEADCHNL RV PACING PULSEWIDTH: 0.4 ms
MDC IDC SET LEADCHNL RV SENSING SENSITIVITY: 2 mV

## 2016-01-08 ENCOUNTER — Other Ambulatory Visit (INDEPENDENT_AMBULATORY_CARE_PROVIDER_SITE_OTHER): Payer: Medicare Other

## 2016-01-08 ENCOUNTER — Other Ambulatory Visit: Payer: PRIVATE HEALTH INSURANCE

## 2016-01-08 DIAGNOSIS — I480 Paroxysmal atrial fibrillation: Secondary | ICD-10-CM | POA: Diagnosis not present

## 2016-01-08 DIAGNOSIS — Z5181 Encounter for therapeutic drug level monitoring: Secondary | ICD-10-CM

## 2016-01-08 LAB — POCT INR: INR: 2.8

## 2016-01-15 ENCOUNTER — Encounter: Payer: Self-pay | Admitting: Internal Medicine

## 2016-02-09 ENCOUNTER — Ambulatory Visit (INDEPENDENT_AMBULATORY_CARE_PROVIDER_SITE_OTHER): Payer: Medicare Other | Admitting: Internal Medicine

## 2016-02-09 ENCOUNTER — Encounter: Payer: Self-pay | Admitting: Internal Medicine

## 2016-02-09 VITALS — BP 112/84 | HR 80 | Temp 98.0°F | Wt 129.0 lb

## 2016-02-09 DIAGNOSIS — Z5181 Encounter for therapeutic drug level monitoring: Secondary | ICD-10-CM | POA: Diagnosis not present

## 2016-02-09 DIAGNOSIS — F39 Unspecified mood [affective] disorder: Secondary | ICD-10-CM

## 2016-02-09 DIAGNOSIS — I480 Paroxysmal atrial fibrillation: Secondary | ICD-10-CM | POA: Diagnosis not present

## 2016-02-09 DIAGNOSIS — I700 Atherosclerosis of aorta: Secondary | ICD-10-CM

## 2016-02-09 DIAGNOSIS — Z23 Encounter for immunization: Secondary | ICD-10-CM

## 2016-02-09 DIAGNOSIS — N184 Chronic kidney disease, stage 4 (severe): Secondary | ICD-10-CM

## 2016-02-09 DIAGNOSIS — I5032 Chronic diastolic (congestive) heart failure: Secondary | ICD-10-CM | POA: Diagnosis not present

## 2016-02-09 DIAGNOSIS — I4891 Unspecified atrial fibrillation: Secondary | ICD-10-CM

## 2016-02-09 LAB — CBC WITH DIFFERENTIAL/PLATELET
BASOS ABS: 0 10*3/uL (ref 0.0–0.1)
Basophils Relative: 0.8 % (ref 0.0–3.0)
EOS ABS: 0.1 10*3/uL (ref 0.0–0.7)
Eosinophils Relative: 2.9 % (ref 0.0–5.0)
HCT: 31.8 % — ABNORMAL LOW (ref 36.0–46.0)
Hemoglobin: 10.7 g/dL — ABNORMAL LOW (ref 12.0–15.0)
LYMPHS ABS: 1.3 10*3/uL (ref 0.7–4.0)
Lymphocytes Relative: 28.7 % (ref 12.0–46.0)
MCHC: 33.8 g/dL (ref 30.0–36.0)
MCV: 84.6 fl (ref 78.0–100.0)
Monocytes Absolute: 0.6 10*3/uL (ref 0.1–1.0)
Monocytes Relative: 13.7 % — ABNORMAL HIGH (ref 3.0–12.0)
NEUTROS ABS: 2.5 10*3/uL (ref 1.4–7.7)
NEUTROS PCT: 53.9 % (ref 43.0–77.0)
PLATELETS: 242 10*3/uL (ref 150.0–400.0)
RBC: 3.76 Mil/uL — ABNORMAL LOW (ref 3.87–5.11)
RDW: 15.3 % (ref 11.5–15.5)
WBC: 4.7 10*3/uL (ref 4.0–10.5)

## 2016-02-09 LAB — RENAL FUNCTION PANEL
Albumin: 4.1 g/dL (ref 3.5–5.2)
BUN: 31 mg/dL — AB (ref 6–23)
CALCIUM: 10.1 mg/dL (ref 8.4–10.5)
CO2: 30 meq/L (ref 19–32)
CREATININE: 1.46 mg/dL — AB (ref 0.40–1.20)
Chloride: 102 mEq/L (ref 96–112)
GFR: 35.46 mL/min — ABNORMAL LOW (ref >60.00)
Glucose, Bld: 81 mg/dL (ref 70–99)
Phosphorus: 3.7 mg/dL (ref 2.3–4.6)
Potassium: 4.5 mEq/L (ref 3.5–5.1)
Sodium: 138 mEq/L (ref 135–145)

## 2016-02-09 LAB — POCT INR: INR: 2.6

## 2016-02-09 NOTE — Assessment & Plan Note (Addendum)
Paced now Less palpitations on the diltiazem Continues on coumadin

## 2016-02-09 NOTE — Addendum Note (Signed)
Addended by: Marchia Bond on: 02/09/2016 02:23 PM   Modules accepted: Orders

## 2016-02-09 NOTE — Assessment & Plan Note (Signed)
Will recheck labs off the lisinopril

## 2016-02-09 NOTE — Assessment & Plan Note (Signed)
Does okay on the mirtazapine

## 2016-02-09 NOTE — Assessment & Plan Note (Signed)
Stable fluid status and functional levels Off lisinopril due to renal issues and need for other med (diltiazem)

## 2016-02-09 NOTE — Addendum Note (Signed)
Addended by: Pilar Grammes on: 02/09/2016 11:45 AM   Modules accepted: Orders

## 2016-02-09 NOTE — Progress Notes (Signed)
Subjective:    Patient ID: Amanda [name], female    DOB: 1919/09/05, 82 y.o.   MRN: [id number]  HPI Here with daughter for follow up of chronic medical conditions Doing okay  Had pacer battery changed out in May Doing fine with this Decreased  sense of palpitations since going on diltiazem The feelings of nausea she was having are mostly gone (may have been from tachycardia) No chest pain No dizziness Mild swelling in feet--mostly at the end of the day--no sig pain  Still lives alone Daughter takes her shopping She still does all instrumental ADLs but daughter handles the finances  Chronic anxiety Her homeplace in [place] is for sale--- looking into buying it (through daughter)  Current Outpatient Prescriptions on File Prior to Visit  Medication Sig Dispense Refill  . acetaminophen (TYLENOL) 500 MG tablet Take 500 mg by mouth every 8 (eight) hours as needed for mild pain.     . cholecalciferol (VITAMIN D) 1000 UNITS tablet Take 1,000 Units by mouth daily.    Marland Kitchen diltiazem (CARDIZEM CD) 120 MG 24 hr capsule Take 1 capsule (120 mg total) by mouth daily. 30 capsule 3  . levothyroxine (SYNTHROID, LEVOTHROID) 75 MCG tablet Take 1 tablet (75 mcg total) by mouth daily. 90 tablet 3  . mirtazapine (REMERON) 15 MG tablet Take 1 tablet by mouth at  bedtime 90 tablet 3  . nitroGLYCERIN (NITROSTAT) 0.4 MG SL tablet Place 0.4 mg under the tongue every 5 (five) minutes as needed for chest pain (x 3 doses).     Marland Kitchen omeprazole (PRILOSEC) 20 MG capsule Take 1 capsule (20 mg total) by mouth 2 (two) times daily before a meal. (Patient taking differently: Take 20 mg by mouth daily. ) 180 capsule 3  . sotalol (BETAPACE) 120 MG tablet Take 0.5 tablets (60 mg total) by mouth 2 (two) times daily. 90 tablet 3  . trimethoprim (TRIMPEX) 100 MG tablet Take 1 tablet by mouth  daily 90 tablet 3  . warfarin (COUMADIN) 1 MG tablet Take as directed by  anticoagulation clinic 70 tablet 4  . warfarin (COUMADIN) 4 MG  tablet Take 1 to 2 tablets by  mouth daily as directed. (Patient taking differently: take 1 tablet by mouth on MOnday only) 180 tablet 3   No current facility-administered medications on file prior to visit.     Allergies  Allergen Reactions  . Amoxicillin Diarrhea    Weakness and atrial fib  . Augmentin [Amoxicillin-Pot Clavulanate] Diarrhea    Weakness and atrial fib  . Sulfonamide Derivatives     unknown    Past Medical History:  Diagnosis Date  . Atrial fibrillation ([place])   . Depression   . GERD (gastroesophageal reflux disease)   . Hyperlipidemia   . Hypertension   . Hypothyroidism    after Rx for thyroid cancer  . Mitral regurgitation   . Osteoarthritis   . Pacemaker   . Sleep disturbance   . Urinary incontinence    usually from UTI's    Past Surgical History:  Procedure Laterality Date  . BREAST BIOPSY  2002  . BREAST LUMPECTOMY  1983  . CATARACT EXTRACTION, BILATERAL    . EP IMPLANTABLE DEVICE N/A 09/02/2015   Procedure: PPM Generator Changeout;  Surgeon: Evans Lance, MD;  Location: [facility];  Service: Cardiovascular;  Laterality: N/A;  . Goiter Removed  1976   cancerous. Then got RAI  . PACEMAKER INSERTION  5/07   after syncope  . TONSILLECTOMY  AND ADENOIDECTOMY  1937  . VESICOVAGINAL FISTULA CLOSURE W/ TAH  1972    Family History  Problem Relation Age of Onset  . Diabetes Mother   . Stroke Sister   . Coronary artery disease Brother   . Prostate cancer Brother   . Breast cancer Neg Hx   . Colon cancer Neg Hx     Social History   Social History  . Marital status: Widowed    Spouse name: N/A  . Number of children: 2  . Years of education: N/A   Occupational History  . homemaker    Social History Main Topics  . Smoking status: Never Smoker  . Smokeless tobacco: Never Used  . Alcohol use No  . Drug use: No  . Sexual activity: Not on file   Other Topics Concern  . Not on file   Social History Narrative   Widowed 2002. 1  local daughter. Other in [place]. Was homemaker. Farmed and gardened. 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    Daughter [name]) is health care POA.    Discussed DNR and she requests    Would not want tube feeds if cognitively unaware   Review of Systems Appetite is good Sleeping better lately Weight stable Mild memory issues 1 fall but no sig injury    Objective:   Physical Exam  Constitutional: She appears well-developed and well-nourished. No distress.  Cardiovascular: Normal rate, regular rhythm and normal heart sounds.  Exam reveals no gallop.   No murmur heard. Feet are warm Faint pulse on left, absent on right  Pulmonary/Chest: Effort normal and breath sounds normal. No respiratory distress. She has no wheezes. She has no rales.  Musculoskeletal: She exhibits no edema or tenderness.  Psychiatric: She has a normal mood and affect. Her behavior is normal.          Assessment & Plan:

## 2016-02-09 NOTE — Assessment & Plan Note (Signed)
No apparent symptoms Decreased perfusion to feet--but no symptoms

## 2016-02-09 NOTE — Progress Notes (Signed)
Pre visit review using our clinic review tool, if applicable. No additional management support is needed unless otherwise documented below in the visit note. 

## 2016-02-14 ENCOUNTER — Other Ambulatory Visit: Payer: Self-pay | Admitting: Internal Medicine

## 2016-03-15 ENCOUNTER — Other Ambulatory Visit (INDEPENDENT_AMBULATORY_CARE_PROVIDER_SITE_OTHER): Payer: Medicare Other

## 2016-03-15 ENCOUNTER — Telehealth: Payer: Self-pay

## 2016-03-15 DIAGNOSIS — Z5181 Encounter for therapeutic drug level monitoring: Secondary | ICD-10-CM | POA: Diagnosis not present

## 2016-03-15 DIAGNOSIS — I480 Paroxysmal atrial fibrillation: Secondary | ICD-10-CM | POA: Diagnosis not present

## 2016-03-15 LAB — POCT INR: INR: 3.6

## 2016-03-15 NOTE — Telephone Encounter (Signed)
Erroneous encounter

## 2016-03-18 ENCOUNTER — Other Ambulatory Visit: Payer: Self-pay | Admitting: Internal Medicine

## 2016-03-18 MED ORDER — DILTIAZEM HCL ER COATED BEADS 120 MG PO CP24: 120.0000 mg | ORAL_CAPSULE | Freq: Every day | ORAL | 3 refills | Status: DC

## 2016-03-23 ENCOUNTER — Telehealth: Payer: Self-pay | Admitting: Cardiology

## 2016-03-23 ENCOUNTER — Ambulatory Visit (INDEPENDENT_AMBULATORY_CARE_PROVIDER_SITE_OTHER): Payer: Medicare Other | Admitting: *Deleted

## 2016-03-23 DIAGNOSIS — I495 Sick sinus syndrome: Secondary | ICD-10-CM | POA: Diagnosis not present

## 2016-03-23 NOTE — Telephone Encounter (Signed)
Confirmed remote transmission w/ pt daughter.   

## 2016-03-24 NOTE — Progress Notes (Signed)
Remote pacemaker transmission.   

## 2016-03-26 ENCOUNTER — Ambulatory Visit (INDEPENDENT_AMBULATORY_CARE_PROVIDER_SITE_OTHER): Payer: Medicare Other

## 2016-03-26 DIAGNOSIS — Z5181 Encounter for therapeutic drug level monitoring: Secondary | ICD-10-CM

## 2016-03-26 DIAGNOSIS — I480 Paroxysmal atrial fibrillation: Secondary | ICD-10-CM

## 2016-03-26 LAB — POCT INR: INR: 3.1

## 2016-03-26 NOTE — Patient Instructions (Signed)
Pre visit review using our clinic review tool, if applicable. No additional management support is needed unless otherwise documented below in the visit note. 

## 2016-03-31 ENCOUNTER — Encounter: Payer: Self-pay | Admitting: Cardiology

## 2016-04-08 LAB — CUP PACEART REMOTE DEVICE CHECK
Battery Remaining Longevity: 134 mo
Brady Statistic AP VP Percent: 1 %
Brady Statistic AS VS Percent: 2 %
Date Time Interrogation Session: 20171206171448
Implantable Lead Implant Date: 20070524
Implantable Lead Location: 753859
Implantable Lead Location: 753860
Lead Channel Impedance Value: 457 Ohm
Lead Channel Impedance Value: 689 Ohm
Lead Channel Pacing Threshold Amplitude: 0.75 V
Lead Channel Pacing Threshold Pulse Width: 0.4 ms
Lead Channel Setting Pacing Amplitude: 2 V
Lead Channel Setting Sensing Sensitivity: 2 mV
MDC IDC LEAD IMPLANT DT: 20070524
MDC IDC MSMT BATTERY IMPEDANCE: 100 Ohm
MDC IDC MSMT BATTERY VOLTAGE: 2.8 V
MDC IDC MSMT LEADCHNL RA PACING THRESHOLD AMPLITUDE: 1 V
MDC IDC MSMT LEADCHNL RA PACING THRESHOLD PULSEWIDTH: 0.4 ms
MDC IDC PG IMPLANT DT: 20170516
MDC IDC SET LEADCHNL RV PACING AMPLITUDE: 2.5 V
MDC IDC SET LEADCHNL RV PACING PULSEWIDTH: 0.4 ms
MDC IDC STAT BRADY AP VS PERCENT: 96 %
MDC IDC STAT BRADY AS VP PERCENT: 0 %

## 2016-04-09 ENCOUNTER — Ambulatory Visit (INDEPENDENT_AMBULATORY_CARE_PROVIDER_SITE_OTHER): Payer: Medicare Other

## 2016-04-09 DIAGNOSIS — Z5181 Encounter for therapeutic drug level monitoring: Secondary | ICD-10-CM | POA: Diagnosis not present

## 2016-04-09 LAB — POCT INR: INR: 4.3

## 2016-04-09 NOTE — Patient Instructions (Signed)
Pre visit review using our clinic review tool, if applicable. No additional management support is needed unless otherwise documented below in the visit note.  INR today: 4.3  Patient reports not decreasing coumadin dosing per last check instructions. She has continued to take the 4mg  on Mondays and 3mg  the rest.  Instructions given today to hold coumadin today and tomorrow and then decrease weekly dosing per protocol to 3mg  daily except 2mg  on Fridays, recheck in 2 weeks on 04/20/16.  Patients daughter present and will assist mom to make necessary changes to pill box.  Written instructions also printed and reviewed with patient and daughter.  Also, discussed risks associated with supratherapeutic level and if any unusual or uncontrollable bleeding develops, patient to go to the Er.  Currently, patient denies any concerns for bleeding or bruising.  Patient and daughter both verbalize understanding of all instructions given today.

## 2016-04-20 ENCOUNTER — Ambulatory Visit (INDEPENDENT_AMBULATORY_CARE_PROVIDER_SITE_OTHER): Payer: Medicare Other

## 2016-04-20 DIAGNOSIS — Z5181 Encounter for therapeutic drug level monitoring: Secondary | ICD-10-CM | POA: Diagnosis not present

## 2016-04-20 DIAGNOSIS — I480 Paroxysmal atrial fibrillation: Secondary | ICD-10-CM | POA: Insufficient documentation

## 2016-04-20 LAB — POCT INR: INR: 3.6

## 2016-04-20 NOTE — Patient Instructions (Signed)
Pre visit review using our clinic review tool, if applicable. No additional management support is needed unless otherwise documented below in the visit note.   INR today:  3.6  Patient reports no abnormal signs of bruising or bleeding and cannot account for any extra doses or changes in medications which may have affected INR level today.  She has maintained a healthy appetite and has enjoyed the holidays with no recent or acute illness.  She denies any cranberry intake over the holidays and is otherwise consistent with her diet.  Daughter has been helping with medication set up this last time and has no concerns for management error.  Due to improved, but still elevated reading, will have patient hold coumadin today (04/20/16) and then decrease dose to taking 3 mg daily EXCEPT for 2mg  on [place] and will recheck in 2 weeks.  Patient and daughter verbalize understanding of dosage instructions and risks of supratherapeutic level including need for ER eval if any unusual or uncontrollable bleeding or bruising develops.

## 2016-05-04 ENCOUNTER — Ambulatory Visit (INDEPENDENT_AMBULATORY_CARE_PROVIDER_SITE_OTHER): Payer: Medicare Other

## 2016-05-04 DIAGNOSIS — Z5181 Encounter for therapeutic drug level monitoring: Secondary | ICD-10-CM

## 2016-05-04 DIAGNOSIS — I480 Paroxysmal atrial fibrillation: Secondary | ICD-10-CM

## 2016-05-04 LAB — POCT INR: INR: 2

## 2016-05-04 NOTE — Patient Instructions (Signed)
Pre visit review using our clinic review tool, if applicable. No additional management support is needed unless otherwise documented below in the visit note. 

## 2016-05-15 ENCOUNTER — Other Ambulatory Visit: Payer: Self-pay | Admitting: Internal Medicine

## 2016-05-24 DIAGNOSIS — H348332 Tributary (branch) retinal vein occlusion, bilateral, stable: Secondary | ICD-10-CM | POA: Diagnosis not present

## 2016-06-01 ENCOUNTER — Ambulatory Visit (INDEPENDENT_AMBULATORY_CARE_PROVIDER_SITE_OTHER): Payer: Medicare Other

## 2016-06-01 DIAGNOSIS — I480 Paroxysmal atrial fibrillation: Secondary | ICD-10-CM

## 2016-06-01 DIAGNOSIS — Z5181 Encounter for therapeutic drug level monitoring: Secondary | ICD-10-CM

## 2016-06-01 LAB — POCT INR: INR: 2.6

## 2016-06-01 NOTE — Patient Instructions (Signed)
Pre visit review using our clinic review tool, if applicable. No additional management support is needed unless otherwise documented below in the visit note. 

## 2016-06-11 ENCOUNTER — Ambulatory Visit (INDEPENDENT_AMBULATORY_CARE_PROVIDER_SITE_OTHER)
Admission: RE | Admit: 2016-06-11 | Discharge: 2016-06-11 | Disposition: A | Discharge status: Home / Self Care | Payer: Medicare Other | Source: Ambulatory Visit | Attending: Internal Medicine | Admitting: Internal Medicine

## 2016-06-11 ENCOUNTER — Encounter: Payer: Self-pay | Admitting: Internal Medicine

## 2016-06-11 ENCOUNTER — Ambulatory Visit (INDEPENDENT_AMBULATORY_CARE_PROVIDER_SITE_OTHER): Payer: Medicare Other | Admitting: Internal Medicine

## 2016-06-11 ENCOUNTER — Ambulatory Visit: Payer: Self-pay | Admitting: Family Medicine

## 2016-06-11 VITALS — BP 140/82 | HR 68 | Temp 97.9°F | Wt 129.8 lb

## 2016-06-11 DIAGNOSIS — S2242XA Multiple fractures of ribs, left side, initial encounter for closed fracture: Secondary | ICD-10-CM | POA: Diagnosis not present

## 2016-06-11 DIAGNOSIS — S20212A Contusion of left front wall of thorax, initial encounter: Secondary | ICD-10-CM

## 2016-06-11 DIAGNOSIS — S2232XA Fracture of one rib, left side, initial encounter for closed fracture: Secondary | ICD-10-CM | POA: Insufficient documentation

## 2016-06-11 MED ORDER — TRAMADOL HCL 50 MG PO TABS: 25.0000 mg | ORAL_TABLET | Freq: Three times a day (TID) | ORAL | 0 refills | Status: DC | PRN

## 2016-06-11 NOTE — Assessment & Plan Note (Signed)
Might have fracture but I doubt it Will check x-ray Tylenol regularly Rx for tramadol in case severe Daughter will stay with her for now

## 2016-06-11 NOTE — Progress Notes (Signed)
Subjective:    Patient ID: Amanda [name], female    DOB: 11-29-1919, 83 y.o.   MRN: [id number]  HPI Here with daughter and SIL  [address] against walker ~5:30AM. It was parked against wall and she was going to the BR Got up to go to bathroom, sat on side of bed Took a few steps--- hit left chest against the walker, then fell to floor Able to get herself up--then called for help (911) Emergency personnel checked her--suggested hospital but she declined They called her daughter and said she would bring her here  Pain is pretty bad Hard to take big breath Feels like past broken rib Nothing else really hurts No SOB  Tylenol has helped  Current Outpatient Prescriptions on File Prior to Visit  Medication Sig Dispense Refill  . acetaminophen (TYLENOL) 500 MG tablet Take 500 mg by mouth every 8 (eight) hours as needed for mild pain.     . cholecalciferol (VITAMIN D) 1000 UNITS tablet Take 1,000 Units by mouth daily.    Marland Kitchen diltiazem (CARDIZEM CD) 120 MG 24 hr capsule Take 1 capsule (120 mg total) by mouth daily. 90 capsule 3  . levothyroxine (SYNTHROID, LEVOTHROID) 75 MCG tablet TAKE 1 TABLET BY MOUTH  DAILY 90 tablet 1  . mirtazapine (REMERON) 15 MG tablet Take 1 tablet by mouth at  bedtime 90 tablet 3  . nitroGLYCERIN (NITROSTAT) 0.4 MG SL tablet Place 0.4 mg under the tongue every 5 (five) minutes as needed for chest pain (x 3 doses).     Marland Kitchen omeprazole (PRILOSEC) 20 MG capsule Take 1 capsule (20 mg total) by mouth 2 (two) times daily before a meal. (Patient taking differently: Take 20 mg by mouth daily. ) 180 capsule 3  . sotalol (BETAPACE) 120 MG tablet TAKE ONE-HALF TABLET BY  MOUTH TWO TIMES DAILY 90 tablet 3  . trimethoprim (TRIMPEX) 100 MG tablet Take 1 tablet by mouth  daily 90 tablet 3  . warfarin (COUMADIN) 1 MG tablet Take as directed by  anticoagulation clinic 70 tablet 4  . warfarin (COUMADIN) 4 MG tablet Take 1 to 2 tablets by  mouth daily as directed. (Patient taking differently:  take 1 tablet by mouth on MOnday only) 180 tablet 3   No current facility-administered medications on file prior to visit.     Allergies  Allergen Reactions  . Amoxicillin Diarrhea    Weakness and atrial fib  . Augmentin [Amoxicillin-Pot Clavulanate] Diarrhea    Weakness and atrial fib  . Sulfonamide Derivatives     unknown    Past Medical History:  Diagnosis Date  . Atrial fibrillation ([place])   . Depression   . GERD (gastroesophageal reflux disease)   . Hyperlipidemia   . Hypertension   . Hypothyroidism    after Rx for thyroid cancer  . Mitral regurgitation   . Osteoarthritis   . Pacemaker   . Sleep disturbance   . Urinary incontinence    usually from UTI's    Past Surgical History:  Procedure Laterality Date  . BREAST BIOPSY  2002  . BREAST LUMPECTOMY  1983  . CATARACT EXTRACTION, BILATERAL    . EP IMPLANTABLE DEVICE N/A 09/02/2015   Procedure: PPM Generator Changeout;  Surgeon: Evans Lance, MD;  Location: [facility];  Service: Cardiovascular;  Laterality: N/A;  . Goiter Removed  1976   cancerous. Then got RAI  . PACEMAKER INSERTION  5/07   after syncope  . TONSILLECTOMY AND ADENOIDECTOMY  1937  .  VESICOVAGINAL FISTULA CLOSURE W/ TAH  1972    Family History  Problem Relation Age of Onset  . Diabetes Mother   . Stroke Sister   . Coronary artery disease Brother   . Prostate cancer Brother   . Breast cancer Neg Hx   . Colon cancer Neg Hx     Social History   Social History  . Marital status: Widowed    Spouse name: N/A  . Number of children: 2  . Years of education: N/A   Occupational History  . homemaker    Social History Main Topics  . Smoking status: Never Smoker  . Smokeless tobacco: Never Used  . Alcohol use No  . Drug use: No  . Sexual activity: Not on file   Other Topics Concern  . Not on file   Social History Narrative   Widowed 2002. 1 local daughter. 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 Has  living will.    Daughter [name]) is health care POA.    Discussed DNR and she requests    Would not want tube feeds if cognitively unaware   Review of Systems  No fever No cough     Objective:   Physical Exam  Cardiovascular: Normal rate, regular rhythm and normal heart sounds.  Exam reveals no gallop and no friction rub.   Pulmonary/Chest: Effort normal and breath sounds normal. No respiratory distress. She has no wheezes. She has no rales.  Tenderness along anterior axillary line on left across lower ribs No obvious fracture          Assessment & Plan:

## 2016-06-11 NOTE — Progress Notes (Signed)
Pre visit review using our clinic review tool, if applicable. No additional management support is needed unless otherwise documented below in the visit note. 

## 2016-06-14 ENCOUNTER — Telehealth: Payer: Self-pay

## 2016-06-14 NOTE — Telephone Encounter (Signed)
Amanda [name] pts daughter ()DPR signed) left v/m; pt was seen 03/11/17 with fx ribs. Now pt is having swelling in her feet, difficulty taking deep breath due to pain,and pt has fear of eating and fear of getting up due to when moves ribs hurt worse. Amanda [name] request cb with what to do to help her mother.

## 2016-06-14 NOTE — Telephone Encounter (Signed)
Spoke to daughter  Has gotten some relief from the tramadol--taking 25mg  every 8 hours. Discussed that with that low dose, she can take it every 3-4 hours. They will let me know when refill needed. Rib belt caused pain --they are trying it again today (discussed proper tension not to cause direct pain but prevent pain with movement or cough)

## 2016-06-16 ENCOUNTER — Other Ambulatory Visit: Payer: Self-pay | Admitting: Internal Medicine

## 2016-06-16 NOTE — Telephone Encounter (Signed)
Last f/u and refill 06/11/2016. pls advise

## 2016-06-16 NOTE — Telephone Encounter (Signed)
Order written for PT and OT evals Please fax to [facility] therapy company at [facility]

## 2016-06-16 NOTE — Telephone Encounter (Signed)
[name] pts daughter (DPR signed) left v/m; pt is at [facility] asked nurse to take pts vital signs this morning; vital signs were OK but nurse advised [name] to contact Dr Silvio Pate to request order for pt to have PT at [facility]. pt is in so much pain due to fx ribs and pt is getting weaker everyday.  [name] request cb.

## 2016-06-16 NOTE — Telephone Encounter (Signed)
Approved: 30 x 0 

## 2016-06-16 NOTE — Telephone Encounter (Signed)
Rx called in to requested pharmacy 

## 2016-06-17 NOTE — Telephone Encounter (Signed)
Spoke to pts daughter and advised orders faxed on 2/28. Per Chrys Racer, they were not received. [facility] states that the orders, have since, been sent to be scanned. Dr Silvio Pate has re-written orders and [facility] will refax, in the event they cannot be located

## 2016-06-17 NOTE — Telephone Encounter (Signed)
[name] pts daughter (DPR signed) request cb re to PT order at [facility].

## 2016-06-21 DIAGNOSIS — R296 Repeated falls: Secondary | ICD-10-CM | POA: Diagnosis not present

## 2016-06-21 DIAGNOSIS — R278 Other lack of coordination: Secondary | ICD-10-CM | POA: Diagnosis not present

## 2016-06-21 DIAGNOSIS — M6281 Muscle weakness (generalized): Secondary | ICD-10-CM | POA: Diagnosis not present

## 2016-06-21 DIAGNOSIS — R2681 Unsteadiness on feet: Secondary | ICD-10-CM | POA: Diagnosis not present

## 2016-06-23 DIAGNOSIS — R278 Other lack of coordination: Secondary | ICD-10-CM | POA: Diagnosis not present

## 2016-06-23 DIAGNOSIS — R2681 Unsteadiness on feet: Secondary | ICD-10-CM | POA: Diagnosis not present

## 2016-06-23 DIAGNOSIS — M6281 Muscle weakness (generalized): Secondary | ICD-10-CM | POA: Diagnosis not present

## 2016-06-23 DIAGNOSIS — R296 Repeated falls: Secondary | ICD-10-CM | POA: Diagnosis not present

## 2016-06-25 ENCOUNTER — Other Ambulatory Visit: Payer: Self-pay

## 2016-06-25 DIAGNOSIS — R296 Repeated falls: Secondary | ICD-10-CM | POA: Diagnosis not present

## 2016-06-25 DIAGNOSIS — R2681 Unsteadiness on feet: Secondary | ICD-10-CM | POA: Diagnosis not present

## 2016-06-25 DIAGNOSIS — R278 Other lack of coordination: Secondary | ICD-10-CM | POA: Diagnosis not present

## 2016-06-25 DIAGNOSIS — M6281 Muscle weakness (generalized): Secondary | ICD-10-CM | POA: Diagnosis not present

## 2016-06-25 MED ORDER — MIRTAZAPINE 15 MG PO TABS: 15.0000 mg | ORAL_TABLET | Freq: Every day | ORAL | 3 refills | Status: DC

## 2016-06-28 ENCOUNTER — Telehealth: Payer: Self-pay | Admitting: Internal Medicine

## 2016-06-28 DIAGNOSIS — R278 Other lack of coordination: Secondary | ICD-10-CM | POA: Diagnosis not present

## 2016-06-28 DIAGNOSIS — M6281 Muscle weakness (generalized): Secondary | ICD-10-CM | POA: Diagnosis not present

## 2016-06-28 DIAGNOSIS — R296 Repeated falls: Secondary | ICD-10-CM | POA: Diagnosis not present

## 2016-06-28 DIAGNOSIS — R2681 Unsteadiness on feet: Secondary | ICD-10-CM | POA: Diagnosis not present

## 2016-06-28 NOTE — Telephone Encounter (Signed)
Spoke w/ pt's daughter.  She states that pt's appt was moved to May and she would like to know if pt can do a remote check before that time.  She is quite upset that pt's appt was moved and feels that pt should be added on to tomorrow's schedule (she sounds shocked that we will be delayed due to the weather).  Advised her that I will send a note to scheduling and see if the can get her set up for remote check. Pt fell and broke 3 ribs, so she would like to be added to the wait list in case of a cancellation.

## 2016-06-28 NOTE — Telephone Encounter (Signed)
Spoke with daughter to rs appt due to weather .  She was disappointed as patient has had recent fall and she wants to make sure everything is ok.   Daughter would like to know if its  A good idea to do a remote check at home prior to her may appt. Please call

## 2016-06-29 ENCOUNTER — Encounter: Payer: Self-pay | Admitting: Internal Medicine

## 2016-06-29 ENCOUNTER — Ambulatory Visit (INDEPENDENT_AMBULATORY_CARE_PROVIDER_SITE_OTHER): Payer: Medicare Other

## 2016-06-29 DIAGNOSIS — Z5181 Encounter for therapeutic drug level monitoring: Secondary | ICD-10-CM

## 2016-06-29 DIAGNOSIS — I480 Paroxysmal atrial fibrillation: Secondary | ICD-10-CM | POA: Diagnosis not present

## 2016-06-29 LAB — POCT INR: INR: 1.8

## 2016-06-29 NOTE — Patient Instructions (Signed)
Pre visit review using our clinic review tool, if applicable. No additional management support is needed unless otherwise documented below in the visit note. 

## 2016-06-29 NOTE — Telephone Encounter (Signed)
Spoke with pts daughter informed her that she that is would be ok to send remote transmission in. Pts daughter asked if it could be scheduled for Thursday 3/15 since she will be with the pt all day that day. Informed her that I would schedule.

## 2016-06-29 NOTE — Telephone Encounter (Signed)
See below

## 2016-06-30 DIAGNOSIS — R278 Other lack of coordination: Secondary | ICD-10-CM | POA: Diagnosis not present

## 2016-06-30 DIAGNOSIS — M6281 Muscle weakness (generalized): Secondary | ICD-10-CM | POA: Diagnosis not present

## 2016-06-30 DIAGNOSIS — R296 Repeated falls: Secondary | ICD-10-CM | POA: Diagnosis not present

## 2016-06-30 DIAGNOSIS — R2681 Unsteadiness on feet: Secondary | ICD-10-CM | POA: Diagnosis not present

## 2016-07-01 ENCOUNTER — Other Ambulatory Visit: Payer: Self-pay | Admitting: Internal Medicine

## 2016-07-01 ENCOUNTER — Ambulatory Visit (INDEPENDENT_AMBULATORY_CARE_PROVIDER_SITE_OTHER): Payer: Medicare Other | Admitting: *Deleted

## 2016-07-01 DIAGNOSIS — I495 Sick sinus syndrome: Secondary | ICD-10-CM

## 2016-07-01 NOTE — Progress Notes (Signed)
Remote pacemaker transmission.   

## 2016-07-01 NOTE — Telephone Encounter (Signed)
Last Rx 06/16/2016 #30. Last OV 05/2016

## 2016-07-01 NOTE — Telephone Encounter (Signed)
Approved: 30 x 0 

## 2016-07-01 NOTE — Telephone Encounter (Signed)
Rx called in to requested pharmacy 

## 2016-07-02 ENCOUNTER — Encounter: Payer: Self-pay | Admitting: Cardiology

## 2016-07-02 DIAGNOSIS — R296 Repeated falls: Secondary | ICD-10-CM | POA: Diagnosis not present

## 2016-07-02 DIAGNOSIS — R2681 Unsteadiness on feet: Secondary | ICD-10-CM | POA: Diagnosis not present

## 2016-07-02 DIAGNOSIS — R278 Other lack of coordination: Secondary | ICD-10-CM | POA: Diagnosis not present

## 2016-07-02 DIAGNOSIS — M6281 Muscle weakness (generalized): Secondary | ICD-10-CM | POA: Diagnosis not present

## 2016-07-02 LAB — CUP PACEART REMOTE DEVICE CHECK
Battery Impedance: 100 Ohm
Battery Voltage: 2.8 V
Brady Statistic AP VS Percent: 95 %
Implantable Lead Implant Date: 20070524
Implantable Lead Location: 753860
Implantable Lead Model: 5076
Implantable Lead Model: 5568
Implantable Pulse Generator Implant Date: 20170516
Lead Channel Impedance Value: 466 Ohm
Lead Channel Impedance Value: 715 Ohm
Lead Channel Pacing Threshold Amplitude: 0.75 V
Lead Channel Pacing Threshold Pulse Width: 0.4 ms
Lead Channel Setting Pacing Amplitude: 1.625
Lead Channel Setting Pacing Amplitude: 2.5 V
MDC IDC LEAD IMPLANT DT: 20070524
MDC IDC LEAD LOCATION: 753859
MDC IDC MSMT BATTERY REMAINING LONGEVITY: 137 mo
MDC IDC MSMT LEADCHNL RV PACING THRESHOLD AMPLITUDE: 0.875 V
MDC IDC MSMT LEADCHNL RV PACING THRESHOLD PULSEWIDTH: 0.4 ms
MDC IDC MSMT LEADCHNL RV SENSING INTR AMPL: 5.6 mV
MDC IDC SESS DTM: 20180315134643
MDC IDC SET LEADCHNL RV PACING PULSEWIDTH: 0.4 ms
MDC IDC SET LEADCHNL RV SENSING SENSITIVITY: 2 mV
MDC IDC STAT BRADY AP VP PERCENT: 1 %
MDC IDC STAT BRADY AS VP PERCENT: 0 %
MDC IDC STAT BRADY AS VS PERCENT: 3 %

## 2016-07-20 ENCOUNTER — Ambulatory Visit (INDEPENDENT_AMBULATORY_CARE_PROVIDER_SITE_OTHER): Payer: Medicare Other

## 2016-07-20 DIAGNOSIS — I480 Paroxysmal atrial fibrillation: Secondary | ICD-10-CM

## 2016-07-20 DIAGNOSIS — Z5181 Encounter for therapeutic drug level monitoring: Secondary | ICD-10-CM

## 2016-07-20 LAB — POCT INR: INR: 2.5

## 2016-07-20 NOTE — Patient Instructions (Signed)
Pre visit review using our clinic review tool, if applicable. No additional management support is needed unless otherwise documented below in the visit note. 

## 2016-07-29 DIAGNOSIS — H6123 Impacted cerumen, bilateral: Secondary | ICD-10-CM | POA: Diagnosis not present

## 2016-07-29 DIAGNOSIS — H903 Sensorineural hearing loss, bilateral: Secondary | ICD-10-CM | POA: Diagnosis not present

## 2016-08-10 ENCOUNTER — Encounter: Payer: Self-pay | Admitting: Internal Medicine

## 2016-08-10 ENCOUNTER — Ambulatory Visit (INDEPENDENT_AMBULATORY_CARE_PROVIDER_SITE_OTHER): Payer: Medicare Other | Admitting: Internal Medicine

## 2016-08-10 VITALS — BP 118/62 | HR 78 | Temp 97.8°F | Ht 62.0 in | Wt 124.0 lb

## 2016-08-10 DIAGNOSIS — N184 Chronic kidney disease, stage 4 (severe): Secondary | ICD-10-CM | POA: Diagnosis not present

## 2016-08-10 DIAGNOSIS — I5032 Chronic diastolic (congestive) heart failure: Secondary | ICD-10-CM | POA: Diagnosis not present

## 2016-08-10 DIAGNOSIS — F39 Unspecified mood [affective] disorder: Secondary | ICD-10-CM | POA: Diagnosis not present

## 2016-08-10 DIAGNOSIS — Z0000 Encounter for general adult medical examination without abnormal findings: Secondary | ICD-10-CM | POA: Diagnosis not present

## 2016-08-10 DIAGNOSIS — D631 Anemia in chronic kidney disease: Secondary | ICD-10-CM | POA: Diagnosis not present

## 2016-08-10 DIAGNOSIS — S2242XG Multiple fractures of ribs, left side, subsequent encounter for fracture with delayed healing: Secondary | ICD-10-CM

## 2016-08-10 DIAGNOSIS — I480 Paroxysmal atrial fibrillation: Secondary | ICD-10-CM

## 2016-08-10 LAB — HEPATIC FUNCTION PANEL
ALK PHOS: 67 U/L (ref 39–117)
ALT: 7 U/L (ref 0–35)
AST: 17 U/L (ref 0–37)
Albumin: 3.7 g/dL (ref 3.5–5.2)
BILIRUBIN DIRECT: 0.1 mg/dL (ref 0.0–0.3)
BILIRUBIN TOTAL: 0.4 mg/dL (ref 0.2–1.2)
Total Protein: 6.9 g/dL (ref 6.0–8.3)

## 2016-08-10 LAB — CBC WITH DIFFERENTIAL/PLATELET
BASOS ABS: 0.1 10*3/uL (ref 0.0–0.1)
Basophils Relative: 1.4 % (ref 0.0–3.0)
Eosinophils Absolute: 0.1 10*3/uL (ref 0.0–0.7)
Eosinophils Relative: 2.8 % (ref 0.0–5.0)
HCT: 30.6 % — ABNORMAL LOW (ref 36.0–46.0)
Hemoglobin: 10.3 g/dL — ABNORMAL LOW (ref 12.0–15.0)
LYMPHS ABS: 1.2 10*3/uL (ref 0.7–4.0)
Lymphocytes Relative: 24.4 % (ref 12.0–46.0)
MCHC: 33.6 g/dL (ref 30.0–36.0)
MCV: 84.2 fl (ref 78.0–100.0)
MONO ABS: 0.8 10*3/uL (ref 0.1–1.0)
MONOS PCT: 17.3 % — AB (ref 3.0–12.0)
NEUTROS ABS: 2.6 10*3/uL (ref 1.4–7.7)
NEUTROS PCT: 54.1 % (ref 43.0–77.0)
PLATELETS: 271 10*3/uL (ref 150.0–400.0)
RBC: 3.63 Mil/uL — ABNORMAL LOW (ref 3.87–5.11)
RDW: 15.3 % (ref 11.5–15.5)
WBC: 4.7 10*3/uL (ref 4.0–10.5)

## 2016-08-10 LAB — T4, FREE: FREE T4: 1.12 ng/dL (ref 0.60–1.60)

## 2016-08-10 LAB — RENAL FUNCTION PANEL
Albumin: 3.7 g/dL (ref 3.5–5.2)
BUN: 25 mg/dL — AB (ref 6–23)
CALCIUM: 9.7 mg/dL (ref 8.4–10.5)
CO2: 30 meq/L (ref 19–32)
CREATININE: 1.28 mg/dL — AB (ref 0.40–1.20)
Chloride: 104 mEq/L (ref 96–112)
GFR: 41.23 mL/min — ABNORMAL LOW (ref >60.00)
GLUCOSE: 114 mg/dL — AB (ref 70–99)
PHOSPHORUS: 3.2 mg/dL (ref 2.3–4.6)
Potassium: 4.1 mEq/L (ref 3.5–5.1)
Sodium: 140 mEq/L (ref 135–145)

## 2016-08-10 MED ORDER — TRAMADOL HCL 50 MG PO TABS: ORAL_TABLET | ORAL | 0 refills | Status: DC

## 2016-08-10 NOTE — Assessment & Plan Note (Signed)
Mild dysthymia and sleep issues Continues on the mirtazapine

## 2016-08-10 NOTE — Assessment & Plan Note (Signed)
Last GFR slightly better BP is fine

## 2016-08-10 NOTE — Progress Notes (Signed)
Pre visit review using our clinic review tool, if applicable. No additional management support is needed unless otherwise documented below in the visit note. 

## 2016-08-10 NOTE — Assessment & Plan Note (Signed)
Finally healing Still some pain Done with therapy

## 2016-08-10 NOTE — Assessment & Plan Note (Signed)
Some edema  Stamina poor No active fluid issues now

## 2016-08-10 NOTE — Progress Notes (Signed)
Subjective:    Patient ID: Amanda [name], female    DOB: 27-Dec-1919, 83 y.o.   MRN: [id number]  HPI Here with daughter for Medicare wellness and follow up of chronic health conditions Reviewed form and advanced directives Reviewed other doctors--Dr Vaught (audiology--has aides), Porfilio for eyes, Gollan/Klein for heart Vision okay. Hearing fair with aide(left) and microphone (right) No alcohol or tobacco Not able to exercise Doesn't drive but does all instrumental ADLs Had fall Chronic mood problems No sig memory issues  Recent fall with the rib fractures Still having pain but is much better Back to living independently Still needs the tramadol at night (helps her sleep) Some back pain as well Strength is not back to normal yet--takes longer to get ready Uses the walker--and careful where she places it  No heart problems Will have some palpitations at times-- if tired (not fast though) No chest pain Breathing is okay No dizziness unless she gets up quick--resolves fairly quickly Chronic edema--- no change  Some sleep problems Usually sleeps fair with the mirtazapine Lonely but no regular depression Not anhedonic--like great great grandson!  Current Outpatient Prescriptions on File Prior to Visit  Medication Sig Dispense Refill  . acetaminophen (TYLENOL) 500 MG tablet Take 500 mg by mouth every 8 (eight) hours as needed for mild pain.     . cholecalciferol (VITAMIN D) 1000 UNITS tablet Take 1,000 Units by mouth daily.    Marland Kitchen levothyroxine (SYNTHROID, LEVOTHROID) 75 MCG tablet TAKE 1 TABLET BY MOUTH  DAILY 90 tablet 1  . mirtazapine (REMERON) 15 MG tablet Take 1 tablet (15 mg total) by mouth at bedtime. 90 tablet 3  . nitroGLYCERIN (NITROSTAT) 0.4 MG SL tablet Place 0.4 mg under the tongue every 5 (five) minutes as needed for chest pain (x 3 doses).     Marland Kitchen omeprazole (PRILOSEC) 20 MG capsule Take 1 capsule (20 mg total) by mouth 2 (two) times daily before a meal. (Patient  taking differently: Take 20 mg by mouth daily. ) 180 capsule 3  . sotalol (BETAPACE) 120 MG tablet TAKE ONE-HALF TABLET BY  MOUTH TWO TIMES DAILY 90 tablet 3  . traMADol (ULTRAM) 50 MG tablet TAKE 1/2 TO 1 TABLET BY MOUTH 3 TIMES A DAY AS NEEDED FOR PAIN 30 tablet 0  . trimethoprim (TRIMPEX) 100 MG tablet Take 1 tablet by mouth  daily 90 tablet 3  . warfarin (COUMADIN) 1 MG tablet Take as directed by  anticoagulation clinic 70 tablet 4  . warfarin (COUMADIN) 4 MG tablet Take 1 to 2 tablets by  mouth daily as directed. (Patient taking differently: take 1 tablet by mouth on MOnday only) 180 tablet 3  . diltiazem (CARDIZEM CD) 120 MG 24 hr capsule Take 1 capsule (120 mg total) by mouth daily. 90 capsule 3   No current facility-administered medications on file prior to visit.     Allergies  Allergen Reactions  . Amoxicillin Diarrhea    Weakness and atrial fib  . Augmentin [Amoxicillin-Pot Clavulanate] Diarrhea    Weakness and atrial fib  . Sulfonamide Derivatives     unknown    Past Medical History:  Diagnosis Date  . Atrial fibrillation ([place])   . Depression   . GERD (gastroesophageal reflux disease)   . Hyperlipidemia   . Hypertension   . Hypothyroidism    after Rx for thyroid cancer  . Mitral regurgitation   . Osteoarthritis   . Pacemaker   . Sleep disturbance   . Urinary incontinence  usually from UTI's    Past Surgical History:  Procedure Laterality Date  . BREAST BIOPSY  2002  . BREAST LUMPECTOMY  1983  . CATARACT EXTRACTION, BILATERAL    . EP IMPLANTABLE DEVICE N/A 09/02/2015   Procedure: PPM Generator Changeout;  Surgeon: Evans Lance, MD;  Location: [facility];  Service: Cardiovascular;  Laterality: N/A;  . Goiter Removed  1976   cancerous. Then got RAI  . PACEMAKER INSERTION  5/07   after syncope  . TONSILLECTOMY AND ADENOIDECTOMY  1937  . VESICOVAGINAL FISTULA CLOSURE W/ TAH  1972    Family History  Problem Relation Age of Onset  . Diabetes  Mother   . Stroke Sister   . Coronary artery disease Brother   . Prostate cancer Brother   . Breast cancer Neg Hx   . Colon cancer Neg Hx     Social History   Social History  . Marital status: Widowed    Spouse name: N/A  . Number of children: 2  . Years of education: N/A   Occupational History  . homemaker    Social History Main Topics  . Smoking status: Never Smoker  . Smokeless tobacco: Never Used  . Alcohol use No  . Drug use: No  . Sexual activity: Not on file   Other Topics Concern  . Not on file   Social History Narrative   Widowed 2002. 1 local daughter. Other in [place]. Was homemaker. Farmed and gardened.    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    Daughter [name]) is health care POA.    Discussed DNR and she requests    Would not want tube feeds if cognitively unaware   Review of Systems Appetite is fair Weight is down a bit since the rib fractures Wears seat belt Teeth are okay--- dentures No significant joint pain--other than mild in her hands Bowels are slow--prunes do help No rash or suspicious skin lesions---it is dry though Voids okay. Some urge incontinence--if prolonged sitting    Objective:   Physical Exam  Constitutional: She is oriented to person, place, and time. No distress.  HENT:  Mouth/Throat: Oropharynx is clear and moist. No oropharyngeal exudate.  Neck: No thyromegaly present.  Cardiovascular: Normal rate, regular rhythm and normal heart sounds.  Exam reveals no gallop.   No murmur heard. Occasional extra beats Feet warm but no palpable pulses  Pulmonary/Chest: Effort normal and breath sounds normal. No respiratory distress. She has no wheezes. She has no rales.  Slight left side tenderness still  Abdominal: Soft. There is no tenderness.  Musculoskeletal: She exhibits no edema.  Lymphadenopathy:    She has no cervical adenopathy.  Neurological: She is alert and oriented to person, place, and time.  President-- "Sheela Stack---  Clinton---Obama" [id number] Recall 3/3  Skin: No rash noted. No erythema.  Psychiatric: She has a normal mood and affect. Her behavior is normal.          Assessment & Plan:

## 2016-08-10 NOTE — Assessment & Plan Note (Signed)
Paced Remains on the warfarin

## 2016-08-10 NOTE — Assessment & Plan Note (Signed)
Hemoglobin usually around 10

## 2016-08-10 NOTE — Assessment & Plan Note (Signed)
I have personally reviewed the Medicare Annual Wellness questionnaire and have noted 1. The patient's medical and social history 2. Their use of alcohol, tobacco or illicit drugs 3. Their current medications and supplements 4. The patient's functional ability including ADL's, fall risks, home safety risks and hearing or visual             impairment. 5. Diet and physical activities 6. Evidence for depression or mood disorders  The patients weight, height, BMI and visual acuity have been recorded in the chart I have made referrals, counseling and provided education to the patient based review of the above and I have provided the pt with a written personalized care plan for preventive services.  I have provided you with a copy of your personalized plan for preventive services. Please take the time to review along with your updated medication list.  No cancer screening UTD on vaccines Hopes to get back to more regular activity--back to living on her own

## 2016-08-17 ENCOUNTER — Ambulatory Visit (INDEPENDENT_AMBULATORY_CARE_PROVIDER_SITE_OTHER): Payer: Medicare Other | Admitting: Internal Medicine

## 2016-08-17 ENCOUNTER — Encounter: Payer: Self-pay | Admitting: Internal Medicine

## 2016-08-17 ENCOUNTER — Ambulatory Visit (INDEPENDENT_AMBULATORY_CARE_PROVIDER_SITE_OTHER): Payer: Medicare Other

## 2016-08-17 ENCOUNTER — Ambulatory Visit: Payer: Medicare Other

## 2016-08-17 VITALS — BP 130/80 | HR 80 | Ht 62.0 in | Wt 121.3 lb

## 2016-08-17 DIAGNOSIS — Z950 Presence of cardiac pacemaker: Secondary | ICD-10-CM

## 2016-08-17 DIAGNOSIS — I495 Sick sinus syndrome: Secondary | ICD-10-CM

## 2016-08-17 DIAGNOSIS — I480 Paroxysmal atrial fibrillation: Secondary | ICD-10-CM

## 2016-08-17 DIAGNOSIS — Z79899 Other long term (current) drug therapy: Secondary | ICD-10-CM

## 2016-08-17 DIAGNOSIS — Z5181 Encounter for therapeutic drug level monitoring: Secondary | ICD-10-CM

## 2016-08-17 LAB — CUP PACEART INCLINIC DEVICE CHECK
Battery Impedance: 100 Ohm
Battery Remaining Longevity: 136 mo
Battery Voltage: 2.8 V
Brady Statistic AP VP Percent: 2 %
Implantable Lead Location: 753859
Implantable Lead Location: 753860
Implantable Lead Model: 5076
Implantable Lead Model: 5568
Implantable Pulse Generator Implant Date: 20170516
Lead Channel Impedance Value: 756 Ohm
Lead Channel Pacing Threshold Amplitude: 0.75 V
Lead Channel Pacing Threshold Amplitude: 0.75 V
Lead Channel Pacing Threshold Pulse Width: 0.4 ms
Lead Channel Sensing Intrinsic Amplitude: 2.8 mV
Lead Channel Sensing Intrinsic Amplitude: 5.6 mV
Lead Channel Setting Pacing Amplitude: 2.5 V
Lead Channel Setting Pacing Pulse Width: 0.4 ms
MDC IDC LEAD IMPLANT DT: 20070524
MDC IDC LEAD IMPLANT DT: 20070524
MDC IDC MSMT LEADCHNL RV IMPEDANCE VALUE: 454 Ohm
MDC IDC MSMT LEADCHNL RV PACING THRESHOLD PULSEWIDTH: 0.4 ms
MDC IDC SESS DTM: 20180501150538
MDC IDC SET LEADCHNL RA PACING AMPLITUDE: 2 V
MDC IDC SET LEADCHNL RV SENSING SENSITIVITY: 2 mV
MDC IDC STAT BRADY AP VS PERCENT: 94 %
MDC IDC STAT BRADY AS VP PERCENT: 0 %
MDC IDC STAT BRADY AS VS PERCENT: 4 %

## 2016-08-17 LAB — POCT INR: INR: 3.3

## 2016-08-17 NOTE — Patient Instructions (Signed)
Pre visit review using our clinic review tool, if applicable. No additional management support is needed unless otherwise documented below in the visit note. 

## 2016-08-17 NOTE — Progress Notes (Signed)
Patient Care Team: Venia Carbon, MD as PCP - [name], MD as Consulting Physician (Cardiology)   HPI  Amanda [name] is a 83 y.o. female seen in followup with a previously implanted pacemaker in 2007 for syncope and apparently tachybradycardia syndrome; she has a history of paroxysmal atrial fibrillation managed with sotalol. She underwent generator replacement 5/17   She has done well with adjunctive diltiazem.   She has had intermittent atrial fib; it has largely been asymptomatic   She had a fall at home where she broke her ribs. She does not recall tripping. She denies orthostatic lightheadedness or shower intolerance. EMS was called. There was no recollection of blood pressure report   2/15 Echo EF    65% mod MR   Her K was 4.0 10/16;  Mg 1.8  11 months ago,  Date Cr K Mg Hgb  6/17  1.76 4.3    4/18  1.28 4.1  10.3      Past Medical History:  Diagnosis Date  . Atrial fibrillation ([place])   . Depression   . GERD (gastroesophageal reflux disease)   . Hyperlipidemia   . Hypertension   . Hypothyroidism    after Rx for thyroid cancer  . Mitral regurgitation   . Osteoarthritis   . Pacemaker   . Sleep disturbance   . Urinary incontinence    usually from UTI's    Past Surgical History:  Procedure Laterality Date  . BREAST BIOPSY  2002  . BREAST LUMPECTOMY  1983  . CATARACT EXTRACTION, BILATERAL    . EP IMPLANTABLE DEVICE N/A 09/02/2015   Procedure: PPM Generator Changeout;  Surgeon: Evans Lance, MD;  Location: [facility];  Service: Cardiovascular;  Laterality: N/A;  . Goiter Removed  1976   cancerous. Then got RAI  . PACEMAKER INSERTION  5/07   after syncope  . TONSILLECTOMY AND ADENOIDECTOMY  1937  . VESICOVAGINAL FISTULA CLOSURE W/ TAH  1972    Current Outpatient Prescriptions  Medication Sig Dispense Refill  . acetaminophen (TYLENOL) 500 MG tablet Take 500 mg by mouth every 8 (eight) hours as needed for mild pain.     .  cholecalciferol (VITAMIN D) 1000 UNITS tablet Take 1,000 Units by mouth daily.    Marland Kitchen diltiazem (CARDIZEM CD) 120 MG 24 hr capsule Take 1 capsule (120 mg total) by mouth daily. 90 capsule 3  . levothyroxine (SYNTHROID, LEVOTHROID) 75 MCG tablet TAKE 1 TABLET BY MOUTH  DAILY 90 tablet 1  . mirtazapine (REMERON) 15 MG tablet Take 1 tablet (15 mg total) by mouth at bedtime. 90 tablet 3  . nitroGLYCERIN (NITROSTAT) 0.4 MG SL tablet Place 0.4 mg under the tongue every 5 (five) minutes as needed for chest pain (x 3 doses).     Marland Kitchen omeprazole (PRILOSEC) 20 MG capsule Take 1 capsule (20 mg total) by mouth 2 (two) times daily before a meal. (Patient taking differently: Take 20 mg by mouth daily. ) 180 capsule 3  . sotalol (BETAPACE) 120 MG tablet TAKE ONE-HALF TABLET BY  MOUTH TWO TIMES DAILY 90 tablet 3  . traMADol (ULTRAM) 50 MG tablet TAKE 1/2 TO 1 TABLET BY MOUTH 3 TIMES A DAY AS NEEDED FOR PAIN 30 tablet 0  . trimethoprim (TRIMPEX) 100 MG tablet Take 1 tablet by mouth  daily 90 tablet 3  . warfarin (COUMADIN) 1 MG tablet Take as directed by  anticoagulation clinic 70 tablet 4  . warfarin (COUMADIN)  4 MG tablet Take 1 to 2 tablets by  mouth daily as directed. (Patient taking differently: take 1 tablet by mouth on MOnday only) 180 tablet 3   No current facility-administered medications for this visit.     Allergies  Allergen Reactions  . Amoxicillin Diarrhea    Weakness and atrial fib  . Augmentin [Amoxicillin-Pot Clavulanate] Diarrhea    Weakness and atrial fib  . Sulfonamide Derivatives     unknown    Review of Systems negative except from HPI and PMH  Physical Exam BP 130/80 (BP Location: Left Arm, Patient Position: Sitting, Cuff Size: Normal)   Pulse 80   Ht 5\' 2"  (1.575 m)   Wt 121 lb 5 oz (55 kg)   BMI 22.19 kg/m  Well developed and well nourished in no acute distress HENT normal  esotropia E scleral and icterus clear Neck Supple JVP flat; carotids brisk and full Device pocket well  healed; without hematoma or erythema.  There is no tethering  Clear to ausculation Regular rate and rhythm, 2/6 murmur Soft with active bowel sounds No clubbing cyanosis no edema   Alert and oriented, grossly normal motor and sensory function Skin Warm and Dry     Assessment and  Plan  Atrial fibrillation with some undersensing  Pacemaker-[employer]tronic  The patient's device was interrogated and the information was fully reviewed.  The device was reprogrammed to adjust rate response -down   High risk medication surveillance-sotalol  Renal insufficiency Gd 4>>3  Hypertension  Fall     Pacemaker reprogrammed as above  Atrial fibrillation burden remains about 30%. For now we will continue the sotalol. Adjunctive diltiazem has been very helpful for mitigating symptoms. Blood pressure seems okay; however, I'm worried about orthostatic intolerance as the cause of her fall but objective data do not support   Have reviewed this and mechanical falls with her and her daughter  Blood pressure is stable   Renal function is better

## 2016-08-17 NOTE — Patient Instructions (Signed)
Medication Instructions: - Your physician recommends that you continue on your current medications as directed. Please refer to the Current Medication list given to you today.  Labwork: - none ordered  Procedures/Testing: - none ordered  Follow-Up: - Remote monitoring is used to monitor your Pacemaker of ICD from home. This monitoring reduces the number of office visits required to check your device to one time per year. It allows [place] to keep an eye on the functioning of your device to ensure it is working properly. You are scheduled for a device check from home on 11/16/16. You may send your transmission at any time that day. If you have a wireless device, the transmission will be sent automatically. After your physician reviews your transmission, you will receive a postcard with your next transmission date.  - Your physician wants you to follow-up in: 9 months with Dr. Caryl Comes. You will receive a reminder letter in the mail two months in advance. If you don't receive a letter, please call our office to schedule the follow-up appointment.   Any Additional Special Instructions Will Be Listed Below (If Applicable).     If you need a refill on your cardiac medications before your next appointment, please call your pharmacy.

## 2016-08-31 ENCOUNTER — Ambulatory Visit (INDEPENDENT_AMBULATORY_CARE_PROVIDER_SITE_OTHER): Payer: Medicare Other

## 2016-08-31 DIAGNOSIS — Z5181 Encounter for therapeutic drug level monitoring: Secondary | ICD-10-CM

## 2016-08-31 DIAGNOSIS — I480 Paroxysmal atrial fibrillation: Secondary | ICD-10-CM | POA: Diagnosis not present

## 2016-08-31 LAB — POCT INR: INR: 1.6

## 2016-08-31 NOTE — Patient Instructions (Signed)
Pre visit review using our clinic review tool, if applicable. No additional management support is needed unless otherwise documented below in the visit note. 

## 2016-09-14 ENCOUNTER — Ambulatory Visit (INDEPENDENT_AMBULATORY_CARE_PROVIDER_SITE_OTHER): Payer: Medicare Other

## 2016-09-14 ENCOUNTER — Ambulatory Visit (INDEPENDENT_AMBULATORY_CARE_PROVIDER_SITE_OTHER)
Admission: RE | Admit: 2016-09-14 | Discharge: 2016-09-14 | Disposition: A | Discharge status: Home / Self Care | Payer: Medicare Other | Source: Ambulatory Visit | Attending: Family Medicine | Admitting: Family Medicine

## 2016-09-14 ENCOUNTER — Encounter: Payer: Self-pay | Admitting: Family Medicine

## 2016-09-14 ENCOUNTER — Ambulatory Visit (INDEPENDENT_AMBULATORY_CARE_PROVIDER_SITE_OTHER): Payer: Medicare Other | Admitting: Family Medicine

## 2016-09-14 VITALS — BP 122/72 | HR 84 | Temp 98.1°F | Ht 62.0 in | Wt 124.2 lb

## 2016-09-14 DIAGNOSIS — Z9181 History of falling: Secondary | ICD-10-CM | POA: Diagnosis not present

## 2016-09-14 DIAGNOSIS — I480 Paroxysmal atrial fibrillation: Secondary | ICD-10-CM | POA: Diagnosis not present

## 2016-09-14 DIAGNOSIS — M545 Low back pain, unspecified: Secondary | ICD-10-CM

## 2016-09-14 DIAGNOSIS — Z5181 Encounter for therapeutic drug level monitoring: Secondary | ICD-10-CM

## 2016-09-14 DIAGNOSIS — R600 Localized edema: Secondary | ICD-10-CM | POA: Insufficient documentation

## 2016-09-14 DIAGNOSIS — F39 Unspecified mood [affective] disorder: Secondary | ICD-10-CM

## 2016-09-14 DIAGNOSIS — E039 Hypothyroidism, unspecified: Secondary | ICD-10-CM

## 2016-09-14 DIAGNOSIS — S3992XA Unspecified injury of lower back, initial encounter: Secondary | ICD-10-CM | POA: Diagnosis not present

## 2016-09-14 LAB — POCT INR: INR: 1.9

## 2016-09-14 NOTE — Patient Instructions (Signed)
Xray of low back today to check for spinal compression fractures  You can try some ice and heat (10 minutes at a time) to help back as needed Acetaminophen - two 325 mg pills up to every 6 hours is ok for pain Continue night time tramadol  Take breaks from activity if you need to  Take the hctz for swelling and keep wearing support stockings  Take care of yourself   If your mood does not improve please let Dr Silvio Pate know

## 2016-09-14 NOTE — Assessment & Plan Note (Signed)
Mood is worse lately-per pt due to frustration with multiple falls and pain  Continues mirtazapine Hesitant to adv this as it would not work as well for sleep Will see if things improve with tx of other issues

## 2016-09-14 NOTE — Assessment & Plan Note (Signed)
Last free T4 was in the normal range Continues 75 mcg levothyroxine

## 2016-09-14 NOTE — Assessment & Plan Note (Signed)
One with fx ribs One with bumped head/back pain  Disc fall prev  Family plans to get her into PT for fall prev/gait training She will no longer go w/o walker  Aware she may not be able to continue living alone if she continues to fall

## 2016-09-14 NOTE — Assessment & Plan Note (Signed)
Puffiness w/o pitting on exam Enc supp hose Also has hctz for prn use  Will update

## 2016-09-14 NOTE — Progress Notes (Signed)
Subjective:    Patient ID: Amanda [name], female    DOB: 1919/07/30, 83 y.o.   MRN: [id number]  HPI Here for depression symptoms in the setting of chronic pain /falls and complex medical history   She is discouraged  [address] in [place] and broke some ribs  Lives by herself and then fell again May 19th - hit R head and R side of body  She stooped over when walker was behind her to pick up something and fell forward    Legs/hips/ankles/back pain - hurt all the time  Worse since 2nd fall in the back  She takes tramadol at night  Feet are swelling more-has not started back on hctz yet   Support stockings for varicose veins    Wt Readings from Last 3 Encounters:  09/14/16 124 lb 4 oz (56.4 kg)  08/17/16 121 lb 5 oz (55 kg)  08/10/16 124 lb (56.2 kg)    Has hx of hypothyroidism Lab Results  Component Value Date   TSH 30.20 (H) 08/08/2015  free T4 1.12 in April of this year  Takes 75 mcg of levothyroxine   Hx of afib On 1/2 pill of sotalol-may have to inc it (Dr Caryl Comes) More breakthrough symptoms - may have to inc her dose back to 1 pill daily  Pulse Rate: 84   Hx of renal insuff Lab Results  Component Value Date   CREATININE 1.28 (H) 08/10/2016   BUN 25 (H) 08/10/2016   NA 140 08/10/2016   K 4.1 08/10/2016   CL 104 08/10/2016   CO2 30 08/10/2016      diag in chart of episodic mood disorder with dysthymia and sleep issues  She takes mirtazapine 15 mg daily for this     Review of Systems Review of Systems  Constitutional: Negative for fever,, fatigue and unexpected weight change. pos for reduced appetite  Eyes: Negative for pain and visual disturbance.  Respiratory: Negative for cough and shortness of breath.   Cardiovascular: Negative for cp or palpitations   Pos for swollen ankles pos for varicose veins  Gastrointestinal: Negative for nausea, diarrhea and constipation.  Genitourinary: Negative for urgency and frequency.  Skin: Negative for pallor or rash     Neurological: Negative for weakness, light-headedness, numbness and headaches. pos for falls  msk pos for low back pain after fall as well as chronic leg pain  Hematological: Negative for adenopathy. Does not bruise/bleed easily.  Psychiatric/Behavioral: pos for dysphoric mood. The patient is not nervous/anxious.  pos for sleep difficulty       Objective:   Physical Exam  Constitutional: She appears well-developed and well-nourished. No distress.  Frail appearing elderly female   HENT:  Head: Normocephalic and atraumatic.  Mouth/Throat: Oropharynx is clear and moist. No oropharyngeal exudate.  Eyes: Conjunctivae and EOM are normal. Pupils are equal, round, and reactive to light.  Neck: Normal range of motion. Neck supple. No JVD present. Carotid bruit is not present. No thyromegaly present.  Cardiovascular: Normal rate, regular rhythm and intact distal pulses.  Exam reveals no gallop.   Reg rhythm today  Varicosities present without signs of phlebitis   Pulmonary/Chest: Effort normal and breath sounds normal. No respiratory distress. She has no wheezes. She has no rales.  No crackles  Abdominal: Soft. Bowel sounds are normal. She exhibits no distension, no abdominal bruit and no mass. There is no tenderness.  Musculoskeletal: She exhibits edema and tenderness.  Some mild lumbar muscular tenderness r>L  No bony tenderness  Fair rom of both hips with minimal discomfort req help getting on chair and exam table    Some non pitting edema in ankles with varicosities    Lymphadenopathy:    She has no cervical adenopathy.  Neurological: She is alert. She has normal reflexes. No cranial nerve deficit. She exhibits normal muscle tone.  Skin: Skin is warm and dry. No rash noted. No pallor.  Bump on R side of head above temple with healed abrasion-old ecchymosis /no tenderness   Psychiatric: Her mood appears anxious. Her affect is not blunt, not labile and not inappropriate. Her speech is  not delayed. She is not agitated and not slowed. Thought content is not paranoid and not delusional.  Pt seemed generally overwhelmed today but not overtly depressed Helpful family present  Cognition seemed nl  Answers questions appropriately          Assessment & Plan:   Problem List Items Addressed This Visit      Endocrine   Hypothyroidism - Primary    Last free T4 was in the normal range Continues 75 mcg levothyroxine        Other   Episodic mood disorder ([facility])    Mood is worse lately-per pt due to frustration with multiple falls and pain  Continues mirtazapine Hesitant to adv this as it would not work as well for sleep Will see if things improve with tx of other issues       History of recent fall    One with fx ribs One with bumped head/back pain  Disc fall prev  Family plans to get her into PT for fall prev/gait training She will no longer go w/o walker  Aware she may not be able to continue living alone if she continues to fall      Low back pain    After a fall  Xray to r/o spinal comp fx  Exam is reassuring for rom of LS as well as hips  Disc safe use of acetaminophen Has tramadol for pm pain to use with caution        Relevant Orders   DG Lumbar Spine Complete   Pedal edema    Puffiness w/o pitting on exam Enc supp hose Also has hctz for prn use  Will update

## 2016-09-14 NOTE — Patient Instructions (Signed)
Pre visit review using our clinic review tool, if applicable. No additional management support is needed unless otherwise documented below in the visit note. 

## 2016-09-14 NOTE — Assessment & Plan Note (Signed)
After a fall  Xray to r/o spinal comp fx  Exam is reassuring for rom of LS as well as hips  Disc safe use of acetaminophen Has tramadol for pm pain to use with caution

## 2016-09-28 ENCOUNTER — Ambulatory Visit (INDEPENDENT_AMBULATORY_CARE_PROVIDER_SITE_OTHER): Payer: Medicare Other

## 2016-09-28 ENCOUNTER — Other Ambulatory Visit: Payer: Self-pay | Admitting: Internal Medicine

## 2016-09-28 ENCOUNTER — Encounter: Payer: Self-pay | Admitting: Cardiovascular Disease

## 2016-09-28 ENCOUNTER — Ambulatory Visit (INDEPENDENT_AMBULATORY_CARE_PROVIDER_SITE_OTHER): Payer: Medicare Other | Admitting: Cardiovascular Disease

## 2016-09-28 VITALS — BP 128/68 | HR 84 | Ht 62.0 in | Wt 117.0 lb

## 2016-09-28 DIAGNOSIS — Z5181 Encounter for therapeutic drug level monitoring: Secondary | ICD-10-CM

## 2016-09-28 DIAGNOSIS — I480 Paroxysmal atrial fibrillation: Secondary | ICD-10-CM

## 2016-09-28 LAB — POCT INR: INR: 1.8

## 2016-09-28 NOTE — Telephone Encounter (Signed)
Approved: 30 x 0 

## 2016-09-28 NOTE — Telephone Encounter (Signed)
Left refill on voice mail at pharmacy  

## 2016-09-28 NOTE — Telephone Encounter (Signed)
Last filled 08-10-16 #30 Last Ov 09-14-16 No Future OV  No UDS

## 2016-09-28 NOTE — Patient Instructions (Signed)
Pre visit review using our clinic review tool, if applicable. No additional management support is needed unless otherwise documented below in the visit note. 

## 2016-09-28 NOTE — Progress Notes (Signed)
Patient ID: Amanda [name], female   DOB: [birth year], 83 y.o.   MRN: [id number] Cardiology Office Note  Date:  09/28/2016   ID:  Amanda [name], DOB 05/31/1919, MRN [id number]  PCP:  Amanda Carbon, MD   Chief Complaint  Patient presents with  . other    6 month f/u c/o diarrhea, lack of appetite and nausea. Meds reviewed verbally with pt.    HPI:  Amanda [name] is a pleasant 83 year old woman who has a history of paroxysmal atrial fibrillation,  normal systolic function by echocardiogram in 2007   Holter monitor and April 2007 showing frequent and complex supraventricular arrhythmia, cardiac CTA showing no significant coronary artery disease with atherosclerotic plaque in the thoracic aorta  managed on sotalol for rhythm control,  pacer placed in May 2007  history of "fainting "prior to that  who presents for routine followup of her atrial fibrillation She has pacemaker in place  Numerous complaints today, predominantly noncardiac She is having diarrhea, nausea, tingling and burning in her lower extremities "I do not feel good"  Diarrhea x 3 days Comes a goes Reports having a long history of diarrhea, periodic Also with nausea. Requesting a pill Lost 7 pounds in 2 weeks  Pain and tingling in her legs around her ankle area Worse at night when she puts her feet up in bed  Takes tramadol at nighttime to go to sleep  Takes HCTZ as needed for ankle swelling   pacemaker change out May 2017 for ERI  Does not do much exercise. Walks with a walker at times, no recent falls Does not use the facilities at [facility]  EKG on today's visit shows atrial paced rhythm with rate 84 bpm, nonspecific ST abnormality PVCs   Other past medical history several previous falls  Previous echocardiogram for shortness of breath echo showed mild LVH, diastolic relaxation abnormality, normal ejection fraction, mild to moderate MR, high normal right ventricular systolic pressures Cardiac  catheterization in April 2007 showing no significant coronary artery disease  PMH:   has a past medical history of Atrial fibrillation ([place]); Depression; GERD (gastroesophageal reflux disease); Hyperlipidemia; Hypertension; Hypothyroidism; Mitral regurgitation; Osteoarthritis; Pacemaker; Sleep disturbance; and Urinary incontinence.  PSH:    Past Surgical History:  Procedure Laterality Date  . BREAST BIOPSY  2002  . BREAST LUMPECTOMY  1983  . CATARACT EXTRACTION, BILATERAL    . EP IMPLANTABLE DEVICE N/A 09/02/2015   Procedure: PPM Generator Changeout;  Surgeon: Evans Lance, MD;  Location: [place] [facility];  Service: Cardiovascular;  Laterality: N/A;  . Goiter Removed  1976   cancerous. Then got RAI  . PACEMAKER INSERTION  5/07   after syncope  . TONSILLECTOMY AND ADENOIDECTOMY  1937  . VESICOVAGINAL FISTULA CLOSURE W/ TAH  1972    Current Outpatient Prescriptions  Medication Sig Dispense Refill  . acetaminophen (TYLENOL) 500 MG tablet Take 500 mg by mouth every 8 (eight) hours as needed for mild pain.     . cholecalciferol (VITAMIN D) 1000 UNITS tablet Take 1,000 Units by mouth daily.    Marland Kitchen diltiazem (CARDIZEM CD) 120 MG 24 hr capsule Take 1 capsule (120 mg total) by mouth daily. 90 capsule 3  . hydrochlorothiazide (HYDRODIURIL) 25 MG tablet Take 25 mg by mouth as needed.    Marland Kitchen levothyroxine (SYNTHROID, LEVOTHROID) 75 MCG tablet TAKE 1 TABLET BY MOUTH  DAILY 90 tablet 1  . mirtazapine (REMERON) 15 MG tablet Take 1 tablet (15 mg total) by mouth at bedtime.  90 tablet 3  . nitroGLYCERIN (NITROSTAT) 0.4 MG SL tablet Place 0.4 mg under the tongue every 5 (five) minutes as needed for chest pain (x 3 doses).     Marland Kitchen omeprazole (PRILOSEC) 20 MG capsule Take 1 capsule (20 mg total) by mouth 2 (two) times daily before a meal. (Patient taking differently: Take 20 mg by mouth daily. ) 180 capsule 3  . sotalol (BETAPACE) 120 MG tablet TAKE ONE-HALF TABLET BY  MOUTH TWO TIMES DAILY 90 tablet 3  .  traMADol (ULTRAM) 50 MG tablet TAKE 1/2 TO 1 TABLET BY MOUTH 3 TIMES A DAY AS NEEDED FOR PAIN 30 tablet 0  . trimethoprim (TRIMPEX) 100 MG tablet Take 1 tablet by mouth  daily 90 tablet 3  . warfarin (COUMADIN) 1 MG tablet Take as directed by  anticoagulation clinic 70 tablet 4  . warfarin (COUMADIN) 4 MG tablet Take 1 to 2 tablets by  mouth daily as directed. (Patient taking differently: take 1 tablet by mouth on MOnday only) 180 tablet 3   No current facility-administered medications for this visit.      Allergies:   Amoxicillin; Augmentin [amoxicillin-pot clavulanate]; and Sulfonamide derivatives   Social History:  The patient  reports that she has never smoked. She has never used smokeless tobacco. She reports that she does not drink alcohol or use drugs.   Family History:   family history includes Coronary artery disease in her brother; Diabetes in her mother; Prostate cancer in her brother; Stroke in her sister.    Review of Systems: Review of Systems  Constitutional: Positive for malaise/fatigue.  Respiratory: Negative.   Cardiovascular: Negative.   Gastrointestinal: Positive for diarrhea and nausea.  Musculoskeletal: Negative.   Neurological: Positive for weakness.       Leg burning, tingling  Psychiatric/Behavioral: Negative.   All other systems reviewed and are negative.    PHYSICAL EXAM: VS:  BP 128/68 (BP Location: Left Arm, Patient Position: Sitting, Cuff Size: Normal)   Pulse 84   Ht 5\' 2"  (1.575 m)   Wt 117 lb (53.1 kg)   BMI 21.40 kg/m  , BMI Body mass index is 21.4 kg/m. GEN: Thin, in no acute distress , difficulty transferring from table to chair HEENT: normal  Neck: no JVD, carotid bruits, or masses Cardiac: RRR; no murmurs, rubs, or gallops,no edema  Respiratory:  clear to auscultation bilaterally, normal work of breathing GI: soft, nontender, nondistended, + BS MS: no deformity or atrophy  Skin: warm and dry, no rash Neuro:  Strength and sensation  are intact Psych: euthymic mood, full affect    Recent Labs: 08/10/2016: ALT 7; BUN 25; Creatinine, Ser 1.28; Hemoglobin 10.3; Platelets 271.0; Potassium 4.1; Sodium 140    Lipid Panel Lab Results  Component Value Date   CHOL 391 (H) 06/22/2011   HDL 63.80 06/22/2011   TRIG 296.0 (H) 06/22/2011      Wt Readings from Last 3 Encounters:  09/28/16 117 lb (53.1 kg)  09/14/16 124 lb 4 oz (56.4 kg)  08/17/16 121 lb 5 oz (55 kg)       ASSESSMENT AND PLAN:   Essential hypertension - Plan: EKG 89-VQXI, Basic Metabolic Panel (BMET) Blood pressure is well controlled on today's visit.  takes HCTZ as needed  Paroxysmal atrial fibrillation (HCC) - Plan: EKG 50-TUUE, Basic Metabolic Panel (BMET) Normal sinus rhythm, paced, on anticoagulation continue sotalol and diltiazem She will talk with pharmacist concerning medications and diarrhea  Chronic diastolic CHF (congestive heart failure) ([place])  Euvolemic, 7 pound weight loss, low fluid and oral intake  Chronic renal disease,  (HCC) Creatinine 1.28, GFR 41  PPM-[employer] Recent hospital records reviewed for change out, lab work reviewed Doing well after her recent change out  Diarrhea Recommended she talk with primary care Suggested she try Imodium as needed  Nausea Seems to be linked to her diarrhea. Potentially could try Zofran  Tingling leg burning Sounds like neuropathy Unclear how bothersome this is for her but they complaint on today's visit If symptoms get worse may need gabapentin or Neurontin at nighttime  Disposition:   F/U  12 months She will see Dr. Caryl Comes in 6 months time   Orders Placed This Encounter  Procedures  . EKG 12-Lead     Total encounter time more than 25 minutes  Greater than 50% was spent in counseling and coordination of care with the patient   Signed, Esmond Plants, M.D., Ph.D. 09/28/2016  [place], [place]

## 2016-09-28 NOTE — Patient Instructions (Addendum)
Talk with Dr. Silvio Pate about possible neuropathy Gabapentin/neurontin  For diarrhea: imodium Nausea: zofran   Medication Instructions:   No medication changes made  Labwork:  No new labs needed  Testing/Procedures:  No further testing at this time   I recommend watching educational videos on topics of interest to you at:       [email or web address]  Enter code: [email or web address]    Follow-Up: It was a pleasure seeing you in the office today. Please call us if you have new issues that need to be addressed before your next appt.  [phone]  Your physician wants you to follow-up in: 12 months.  You will receive a reminder letter in the mail two months in advance. If you don't receive a letter, please call our office to schedule the follow-up appointment.  If you need a refill on your cardiac medications before your next appointment, please call your pharmacy.

## 2016-10-08 ENCOUNTER — Encounter: Payer: Self-pay | Admitting: Internal Medicine

## 2016-10-08 ENCOUNTER — Ambulatory Visit (INDEPENDENT_AMBULATORY_CARE_PROVIDER_SITE_OTHER): Payer: Medicare Other | Admitting: Internal Medicine

## 2016-10-08 VITALS — BP 122/70 | HR 75 | Temp 97.4°F | Wt 121.0 lb

## 2016-10-08 DIAGNOSIS — R200 Anesthesia of skin: Secondary | ICD-10-CM | POA: Diagnosis not present

## 2016-10-08 DIAGNOSIS — G629 Polyneuropathy, unspecified: Secondary | ICD-10-CM | POA: Diagnosis not present

## 2016-10-08 LAB — VITAMIN B12: VITAMIN B 12: 368 pg/mL (ref 211–911)

## 2016-10-08 NOTE — Progress Notes (Signed)
Subjective:    Patient ID: Amanda [name], female    DOB: [birth year], 83 y.o.   MRN: [id number]  HPI Here with daughter due to leg sensory problems Having leg burning fairly constantly Some edema--restarted HCTZ -- helped the swelling  Burning, tingling, cold sensation--hard to keep them still "I can't live with this" Goes back perhaps a year Did try the tramadol last night and that helped  Current Outpatient Prescriptions on File Prior to Visit  Medication Sig Dispense Refill  . acetaminophen (TYLENOL) 500 MG tablet Take 500 mg by mouth every 8 (eight) hours as needed for mild pain.     . cholecalciferol (VITAMIN D) 1000 UNITS tablet Take 1,000 Units by mouth daily.    . hydrochlorothiazide (HYDRODIURIL) 25 MG tablet Take 25 mg by mouth as needed.    Marland Kitchen levothyroxine (SYNTHROID, LEVOTHROID) 75 MCG tablet TAKE 1 TABLET BY MOUTH  DAILY 90 tablet 1  . mirtazapine (REMERON) 15 MG tablet Take 1 tablet (15 mg total) by mouth at bedtime. 90 tablet 3  . nitroGLYCERIN (NITROSTAT) 0.4 MG SL tablet Place 0.4 mg under the tongue every 5 (five) minutes as needed for chest pain (x 3 doses).     Marland Kitchen omeprazole (PRILOSEC) 20 MG capsule Take 1 capsule (20 mg total) by mouth 2 (two) times daily before a meal. (Patient taking differently: Take 20 mg by mouth daily. ) 180 capsule 3  . sotalol (BETAPACE) 120 MG tablet TAKE ONE-HALF TABLET BY  MOUTH TWO TIMES DAILY 90 tablet 3  . traMADol (ULTRAM) 50 MG tablet TAKE 1/2 TO 1 TABLET 3 TIMES A DAY AS NEEDED FOR PAIN 30 tablet 0  . trimethoprim (TRIMPEX) 100 MG tablet Take 1 tablet by mouth  daily 90 tablet 3  . warfarin (COUMADIN) 1 MG tablet Take as directed by  anticoagulation clinic 70 tablet 4  . warfarin (COUMADIN) 4 MG tablet Take 1 to 2 tablets by  mouth daily as directed. (Patient taking differently: take 1 tablet by mouth on MOnday only) 180 tablet 3  . diltiazem (CARDIZEM CD) 120 MG 24 hr capsule Take 1 capsule (120 mg total) by mouth daily. 90 capsule 3     No current facility-administered medications on file prior to visit.     Allergies  Allergen Reactions  . Amoxicillin Diarrhea    Weakness and atrial fib  . Augmentin [Amoxicillin-Pot Clavulanate] Diarrhea    Weakness and atrial fib  . Sulfonamide Derivatives     unknown    Past Medical History:  Diagnosis Date  . Atrial fibrillation ([place])   . Depression   . GERD (gastroesophageal reflux disease)   . Hyperlipidemia   . Hypertension   . Hypothyroidism    after Rx for thyroid cancer  . Mitral regurgitation   . Osteoarthritis   . Pacemaker   . Sleep disturbance   . Urinary incontinence    usually from UTI's    Past Surgical History:  Procedure Laterality Date  . BREAST BIOPSY  2002  . BREAST LUMPECTOMY  1983  . CATARACT EXTRACTION, BILATERAL    . EP IMPLANTABLE DEVICE N/A 09/02/2015   Procedure: PPM Generator Changeout;  Surgeon: Evans Lance, MD;  Location: [facility];  Service: Cardiovascular;  Laterality: N/A;  . Goiter Removed  1976   cancerous. Then got RAI  . PACEMAKER INSERTION  5/07   after syncope  . TONSILLECTOMY AND ADENOIDECTOMY  1937  . VESICOVAGINAL FISTULA CLOSURE W/ TAH  1972  Family History  Problem Relation Age of Onset  . Diabetes Mother   . Stroke Sister   . Coronary artery disease Brother   . Prostate cancer Brother   . Breast cancer Neg Hx   . Colon cancer Neg Hx     Social History   Social History  . Marital status: Widowed    Spouse name: N/A  . Number of children: 2  . Years of education: N/A   Occupational History  . homemaker    Social History Main Topics  . Smoking status: Never Smoker  . Smokeless tobacco: Never Used  . Alcohol use No  . Drug use: No  . Sexual activity: Not on file   Other Topics Concern  . Not on file   Social History Narrative   Widowed 2002. 1 local daughter. 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 Was homemaker. Farmed and gardened.    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    Daughter [name]) is health care  POA.    Discussed DNR and she requests    Would not want tube feeds if cognitively unaware   Review of Systems  Uses tylenol in daytime for arthritis pain Appetite is fair Weight is holding--down a bit over the past year     Objective:   Physical Exam  Cardiovascular:  Faint pedal pulses  Musculoskeletal:  No edema--some stasis changes  Neurological:  Mild decreased sensation in medial feet and ankles          Assessment & Plan:

## 2016-10-08 NOTE — Assessment & Plan Note (Signed)
Likely multifactorial Has component of restless legs also Advised daily iron supplement Slept well with tramadol---recommended nightly dose of this  If not effective, would then try gabapentin Check B12 and electrophoresis

## 2016-10-08 NOTE — Patient Instructions (Signed)
Please start taking a daily over the counter iron supplement. Try the tramadol nightly to help you sleep. If this is ineffective, we will try the other medication (gabapentin)

## 2016-10-12 ENCOUNTER — Other Ambulatory Visit (INDEPENDENT_AMBULATORY_CARE_PROVIDER_SITE_OTHER): Payer: Medicare Other

## 2016-10-12 ENCOUNTER — Ambulatory Visit (INDEPENDENT_AMBULATORY_CARE_PROVIDER_SITE_OTHER): Payer: Medicare Other

## 2016-10-12 DIAGNOSIS — I480 Paroxysmal atrial fibrillation: Secondary | ICD-10-CM | POA: Diagnosis not present

## 2016-10-12 DIAGNOSIS — Z5181 Encounter for therapeutic drug level monitoring: Secondary | ICD-10-CM | POA: Diagnosis not present

## 2016-10-12 LAB — PROTEIN ELECTROPHORESIS, SERUM, WITH REFLEX
ALPHA-2-GLOBULIN: 0.7 g/dL (ref 0.5–0.9)
Albumin ELP: 3.5 g/dL — ABNORMAL LOW (ref 3.8–4.8)
Alpha-1-Globulin: 0.3 g/dL (ref 0.2–0.3)
Beta 2: 0.3 g/dL (ref 0.2–0.5)
Beta Globulin: 0.4 g/dL (ref 0.4–0.6)
GAMMA GLOBULIN: 1.2 g/dL (ref 0.8–1.7)
Total Protein, Serum Electrophoresis: 6.4 g/dL (ref 6.1–8.1)

## 2016-10-12 LAB — POCT INR: INR: 2.4

## 2016-10-13 NOTE — Patient Instructions (Signed)
Pre visit review using our clinic review tool, if applicable. No additional management support is needed unless otherwise documented below in the visit note. 

## 2016-10-25 ENCOUNTER — Other Ambulatory Visit: Payer: Self-pay | Admitting: Internal Medicine

## 2016-10-25 NOTE — Telephone Encounter (Signed)
Left refill on voice mail at pharmacy  

## 2016-10-25 NOTE — Telephone Encounter (Signed)
Last filled 09-28-16 #30 Last OV 10-08-16 Next OV 08-16-17  No UDS

## 2016-10-25 NOTE — Telephone Encounter (Signed)
Approved: 30 x 0 

## 2016-11-09 ENCOUNTER — Ambulatory Visit (INDEPENDENT_AMBULATORY_CARE_PROVIDER_SITE_OTHER): Payer: Medicare Other

## 2016-11-09 DIAGNOSIS — Z5181 Encounter for therapeutic drug level monitoring: Secondary | ICD-10-CM

## 2016-11-09 DIAGNOSIS — I480 Paroxysmal atrial fibrillation: Secondary | ICD-10-CM | POA: Diagnosis not present

## 2016-11-09 LAB — POCT INR: INR: 3.2

## 2016-11-09 NOTE — Patient Instructions (Signed)
Pre visit review using our clinic review tool, if applicable. No additional management support is needed unless otherwise documented below in the visit note. 

## 2016-11-15 ENCOUNTER — Other Ambulatory Visit: Payer: Self-pay | Admitting: Internal Medicine

## 2016-11-15 NOTE — Telephone Encounter (Signed)
Left refill on voice mail at pharmacy  

## 2016-11-15 NOTE — Telephone Encounter (Signed)
Approved: 30 x 0 

## 2016-11-15 NOTE — Telephone Encounter (Signed)
Last filled 10-25-16 #30 Last OV 10-08-16 Next OV 08-16-17

## 2016-11-16 ENCOUNTER — Ambulatory Visit (INDEPENDENT_AMBULATORY_CARE_PROVIDER_SITE_OTHER): Payer: Medicare Other | Admitting: *Deleted

## 2016-11-16 DIAGNOSIS — I495 Sick sinus syndrome: Secondary | ICD-10-CM

## 2016-11-16 NOTE — Progress Notes (Signed)
Remote pacemaker transmission.   

## 2016-11-19 ENCOUNTER — Encounter: Payer: Self-pay | Admitting: Cardiology

## 2016-11-19 LAB — CUP PACEART REMOTE DEVICE CHECK
Battery Impedance: 100 Ohm
Battery Remaining Longevity: 138 mo
Battery Voltage: 2.8 V
Date Time Interrogation Session: 20180731132904
Implantable Lead Implant Date: 20070524
Implantable Lead Location: 753859
Implantable Pulse Generator Implant Date: 20170516
Lead Channel Pacing Threshold Amplitude: 0.75 V
Lead Channel Pacing Threshold Amplitude: 1 V
Lead Channel Pacing Threshold Pulse Width: 0.4 ms
Lead Channel Setting Pacing Pulse Width: 0.4 ms
Lead Channel Setting Sensing Sensitivity: 2 mV
MDC IDC LEAD IMPLANT DT: 20070524
MDC IDC LEAD LOCATION: 753860
MDC IDC MSMT LEADCHNL RA IMPEDANCE VALUE: 756 Ohm
MDC IDC MSMT LEADCHNL RA PACING THRESHOLD PULSEWIDTH: 0.4 ms
MDC IDC MSMT LEADCHNL RV IMPEDANCE VALUE: 483 Ohm
MDC IDC MSMT LEADCHNL RV SENSING INTR AMPL: 5.6 mV
MDC IDC SET LEADCHNL RA PACING AMPLITUDE: 2 V
MDC IDC SET LEADCHNL RV PACING AMPLITUDE: 2.5 V
MDC IDC STAT BRADY AP VP PERCENT: 2 %
MDC IDC STAT BRADY AP VS PERCENT: 92 %
MDC IDC STAT BRADY AS VP PERCENT: 0 %
MDC IDC STAT BRADY AS VS PERCENT: 6 %

## 2016-11-22 DIAGNOSIS — H348332 Tributary (branch) retinal vein occlusion, bilateral, stable: Secondary | ICD-10-CM | POA: Diagnosis not present

## 2016-11-22 DIAGNOSIS — H43813 Vitreous degeneration, bilateral: Secondary | ICD-10-CM | POA: Diagnosis not present

## 2016-11-30 ENCOUNTER — Ambulatory Visit (INDEPENDENT_AMBULATORY_CARE_PROVIDER_SITE_OTHER): Payer: Medicare Other

## 2016-11-30 DIAGNOSIS — I480 Paroxysmal atrial fibrillation: Secondary | ICD-10-CM

## 2016-11-30 DIAGNOSIS — Z5181 Encounter for therapeutic drug level monitoring: Secondary | ICD-10-CM

## 2016-11-30 LAB — POCT INR: INR: 2.6

## 2016-11-30 NOTE — Patient Instructions (Signed)
Pre visit review using our clinic review tool, if applicable. No additional management support is needed unless otherwise documented below in the visit note. 

## 2016-12-09 ENCOUNTER — Other Ambulatory Visit: Payer: Self-pay | Admitting: Internal Medicine

## 2016-12-09 NOTE — Telephone Encounter (Signed)
Patient is compliant with coumadin management, will refill X 6 months.   

## 2016-12-09 NOTE — Telephone Encounter (Signed)
Amanda [name] will generally handle these--- if she is out today, go ahead and fill 1 month with 5 refills at current dosing

## 2016-12-09 NOTE — Telephone Encounter (Signed)
Please advise on refill request  Last filled 06/20/14 Qty: 180 RF:3

## 2016-12-18 ENCOUNTER — Other Ambulatory Visit: Payer: Self-pay | Admitting: Internal Medicine

## 2016-12-21 ENCOUNTER — Ambulatory Visit (INDEPENDENT_AMBULATORY_CARE_PROVIDER_SITE_OTHER): Payer: Medicare Other | Admitting: Internal Medicine

## 2016-12-21 ENCOUNTER — Encounter: Payer: Self-pay | Admitting: Internal Medicine

## 2016-12-21 VITALS — BP 104/60 | HR 58 | Temp 97.8°F | Wt 116.0 lb

## 2016-12-21 DIAGNOSIS — K4090 Unilateral inguinal hernia, without obstruction or gangrene, not specified as recurrent: Secondary | ICD-10-CM | POA: Diagnosis not present

## 2016-12-21 NOTE — Telephone Encounter (Signed)
Left refill on voice mail at pharmacy  

## 2016-12-21 NOTE — Telephone Encounter (Signed)
Last filled 11-15-16 #30 Last OV 10-08-16 Next OV 08-16-17

## 2016-12-21 NOTE — Progress Notes (Signed)
Subjective:    Patient ID: Amanda [name], female    DOB: 04-17-1920, 83 y.o.   MRN: [id number]  HPI Here with daughter due to left groin mass  May have been present for months Reminds her of moveable skin areas in neck and at left elbow First noticed it seemed to be bigger last week in shower Notices it more when standing No pain  Current Outpatient Prescriptions on File Prior to Visit  Medication Sig Dispense Refill  . acetaminophen (TYLENOL) 500 MG tablet Take 500 mg by mouth every 8 (eight) hours as needed for mild pain.     . cholecalciferol (VITAMIN D) 1000 UNITS tablet Take 1,000 Units by mouth daily.    Marland Kitchen diltiazem (CARDIZEM CD) 120 MG 24 hr capsule TAKE 1 CAPSULE BY MOUTH  DAILY 90 capsule 3  . hydrochlorothiazide (HYDRODIURIL) 25 MG tablet Take 25 mg by mouth as needed.    Marland Kitchen levothyroxine (SYNTHROID, LEVOTHROID) 75 MCG tablet TAKE 1 TABLET BY MOUTH  DAILY 90 tablet 1  . mirtazapine (REMERON) 15 MG tablet Take 1 tablet (15 mg total) by mouth at bedtime. 90 tablet 3  . nitroGLYCERIN (NITROSTAT) 0.4 MG SL tablet Place 0.4 mg under the tongue every 5 (five) minutes as needed for chest pain (x 3 doses).     Marland Kitchen omeprazole (PRILOSEC) 20 MG capsule Take 1 capsule (20 mg total) by mouth 2 (two) times daily before a meal. (Patient taking differently: Take 20 mg by mouth daily. ) 180 capsule 3  . sotalol (BETAPACE) 120 MG tablet TAKE ONE-HALF TABLET BY  MOUTH TWO TIMES DAILY 90 tablet 3  . traMADol (ULTRAM) 50 MG tablet TAKE 1/2-1 TABLET BY MOUTH 3 TIMES DAILY AS NEEDED FOR PAIN 70 tablet 0  . trimethoprim (TRIMPEX) 100 MG tablet Take 1 tablet by mouth  daily 90 tablet 3  . warfarin (COUMADIN) 1 MG tablet Take as directed by  anticoagulation clinic 70 tablet 4  . warfarin (COUMADIN) 4 MG tablet Take daily as directed by coumadin clinic. 90 tablet 1   No current facility-administered medications on file prior to visit.     Allergies  Allergen Reactions  . Amoxicillin Diarrhea   Weakness and atrial fib  . Augmentin [Amoxicillin-Pot Clavulanate] Diarrhea    Weakness and atrial fib  . Sulfonamide Derivatives     unknown    Past Medical History:  Diagnosis Date  . Atrial fibrillation ([place])   . Depression   . GERD (gastroesophageal reflux disease)   . Hyperlipidemia   . Hypertension   . Hypothyroidism    after Rx for thyroid cancer  . Mitral regurgitation   . Osteoarthritis   . Pacemaker   . Sleep disturbance   . Urinary incontinence    usually from UTI's    Past Surgical History:  Procedure Laterality Date  . BREAST BIOPSY  2002  . BREAST LUMPECTOMY  1983  . CATARACT EXTRACTION, BILATERAL    . EP IMPLANTABLE DEVICE N/A 09/02/2015   Procedure: PPM Generator Changeout;  Surgeon: Evans Lance, MD;  Location: [facility];  Service: Cardiovascular;  Laterality: N/A;  . Goiter Removed  1976   cancerous. Then got RAI  . PACEMAKER INSERTION  5/07   after syncope  . TONSILLECTOMY AND ADENOIDECTOMY  1937  . VESICOVAGINAL FISTULA CLOSURE W/ TAH  1972    Family History  Problem Relation Age of Onset  . Diabetes Mother   . Stroke Sister   . Coronary artery disease  Brother   . Prostate cancer Brother   . Breast cancer Neg Hx   . Colon cancer Neg Hx     Social History   Social History  . Marital status: Widowed    Spouse name: N/A  . Number of children: 2  . Years of education: N/A   Occupational History  . homemaker    Social History Main Topics  . Smoking status: Never Smoker  . Smokeless tobacco: Never Used  . Alcohol use No  . Drug use: No  . Sexual activity: Not on file   Other Topics Concern  . Not on file   Social History Narrative   Widowed 2002. 1 local daughter. 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    Daughter [name]) is health care POA.    Discussed DNR and she requests    Would not want tube feeds if cognitively unaware   Review of Systems  Leg pain is better Taking  the tramadol every night---sleeps well     Objective:   Physical Exam  Constitutional: No distress.  Abdominal:  Small reducible hernia in left groin          Assessment & Plan:

## 2016-12-21 NOTE — Telephone Encounter (Signed)
Approved: #70 x 0

## 2016-12-21 NOTE — Assessment & Plan Note (Signed)
Small left hernia Doesn't seem to be femoral--may be direct inguinal  Easily reducible and no symptoms Will just observe Discussed safe lifting, etc

## 2016-12-28 ENCOUNTER — Ambulatory Visit (INDEPENDENT_AMBULATORY_CARE_PROVIDER_SITE_OTHER): Payer: Medicare Other

## 2016-12-28 DIAGNOSIS — I480 Paroxysmal atrial fibrillation: Secondary | ICD-10-CM | POA: Diagnosis not present

## 2016-12-28 DIAGNOSIS — Z5181 Encounter for therapeutic drug level monitoring: Secondary | ICD-10-CM

## 2016-12-28 LAB — POCT INR: INR: 2.6

## 2016-12-28 NOTE — Patient Instructions (Signed)
Pre visit review using our clinic review tool, if applicable. No additional management support is needed unless otherwise documented below in the visit note. 

## 2017-01-03 ENCOUNTER — Other Ambulatory Visit: Payer: Self-pay | Admitting: Internal Medicine

## 2017-01-03 NOTE — Telephone Encounter (Signed)
Approved: okay to do levothyroxine for a year Sotalol should go to the cardiologist--I don't prescribe anti-arrhythmics

## 2017-01-03 NOTE — Telephone Encounter (Signed)
Last labs 07/2015. pls advise

## 2017-01-24 ENCOUNTER — Other Ambulatory Visit: Payer: Self-pay | Admitting: Internal Medicine

## 2017-01-25 ENCOUNTER — Ambulatory Visit: Payer: Self-pay

## 2017-01-28 DIAGNOSIS — H6123 Impacted cerumen, bilateral: Secondary | ICD-10-CM | POA: Diagnosis not present

## 2017-01-28 DIAGNOSIS — H903 Sensorineural hearing loss, bilateral: Secondary | ICD-10-CM | POA: Diagnosis not present

## 2017-02-01 ENCOUNTER — Ambulatory Visit (INDEPENDENT_AMBULATORY_CARE_PROVIDER_SITE_OTHER): Payer: Medicare Other

## 2017-02-01 DIAGNOSIS — I480 Paroxysmal atrial fibrillation: Secondary | ICD-10-CM | POA: Diagnosis not present

## 2017-02-01 DIAGNOSIS — Z23 Encounter for immunization: Secondary | ICD-10-CM

## 2017-02-01 DIAGNOSIS — Z5181 Encounter for therapeutic drug level monitoring: Secondary | ICD-10-CM

## 2017-02-01 LAB — POCT INR: INR: 2.6

## 2017-02-02 DIAGNOSIS — Z23 Encounter for immunization: Secondary | ICD-10-CM | POA: Diagnosis not present

## 2017-02-02 NOTE — Patient Instructions (Signed)
Pre visit review using our clinic review tool, if applicable. No additional management support is needed unless otherwise documented below in the visit note. 

## 2017-02-15 ENCOUNTER — Ambulatory Visit (INDEPENDENT_AMBULATORY_CARE_PROVIDER_SITE_OTHER): Payer: Medicare Other | Admitting: *Deleted

## 2017-02-15 DIAGNOSIS — I495 Sick sinus syndrome: Secondary | ICD-10-CM

## 2017-02-17 NOTE — Progress Notes (Signed)
Remote pacemaker transmission.   

## 2017-02-18 LAB — CUP PACEART REMOTE DEVICE CHECK
Battery Impedance: 100 Ohm
Battery Remaining Longevity: 137 mo
Battery Voltage: 2.79 V
Brady Statistic AP VP Percent: 4 %
Brady Statistic AS VS Percent: 4 %
Date Time Interrogation Session: 20181031170556
Implantable Lead Implant Date: 20070524
Implantable Lead Implant Date: 20070524
Implantable Lead Location: 753860
Implantable Lead Model: 5076
Implantable Pulse Generator Implant Date: 20170516
Lead Channel Impedance Value: 771 Ohm
Lead Channel Pacing Threshold Amplitude: 1 V
Lead Channel Setting Pacing Amplitude: 2 V
Lead Channel Setting Pacing Amplitude: 2.5 V
Lead Channel Setting Pacing Pulse Width: 0.4 ms
MDC IDC LEAD LOCATION: 753859
MDC IDC MSMT LEADCHNL RA PACING THRESHOLD PULSEWIDTH: 0.4 ms
MDC IDC MSMT LEADCHNL RV IMPEDANCE VALUE: 463 Ohm
MDC IDC MSMT LEADCHNL RV PACING THRESHOLD AMPLITUDE: 0.75 V
MDC IDC MSMT LEADCHNL RV PACING THRESHOLD PULSEWIDTH: 0.4 ms
MDC IDC MSMT LEADCHNL RV SENSING INTR AMPL: 5.6 mV
MDC IDC SET LEADCHNL RV SENSING SENSITIVITY: 2 mV
MDC IDC STAT BRADY AP VS PERCENT: 92 %
MDC IDC STAT BRADY AS VP PERCENT: 0 %

## 2017-02-23 ENCOUNTER — Encounter: Payer: Self-pay | Admitting: Cardiology

## 2017-02-24 ENCOUNTER — Other Ambulatory Visit: Payer: Self-pay | Admitting: Internal Medicine

## 2017-02-24 NOTE — Telephone Encounter (Signed)
Last filled 12-21-16 #70 Last OV 12-21-16 Next OV 08-16-17

## 2017-02-24 NOTE — Telephone Encounter (Signed)
Patient is compliant with coumadin management.  Refill X 6 months.

## 2017-02-25 NOTE — Telephone Encounter (Signed)
Left refill on voice mail at pharmacy  

## 2017-02-25 NOTE — Telephone Encounter (Signed)
Approved: #90 x 0 

## 2017-03-01 ENCOUNTER — Ambulatory Visit: Payer: Self-pay

## 2017-03-04 ENCOUNTER — Ambulatory Visit (INDEPENDENT_AMBULATORY_CARE_PROVIDER_SITE_OTHER): Payer: Medicare Other

## 2017-03-04 DIAGNOSIS — Z7901 Long term (current) use of anticoagulants: Secondary | ICD-10-CM | POA: Diagnosis not present

## 2017-03-04 DIAGNOSIS — I480 Paroxysmal atrial fibrillation: Secondary | ICD-10-CM | POA: Diagnosis not present

## 2017-03-04 DIAGNOSIS — Z5181 Encounter for therapeutic drug level monitoring: Secondary | ICD-10-CM | POA: Diagnosis not present

## 2017-03-04 LAB — POCT INR: INR: 2.8

## 2017-03-04 LAB — PROTIME-INR
INR: 2.8 ratio — AB (ref 0.8–1.0)
PROTHROMBIN TIME: 29.9 s — AB (ref 9.6–13.1)

## 2017-03-04 NOTE — Patient Instructions (Signed)
INR today:  2.8  Patient is doing well without any changes in diet, health or medications.  Continue taking 3mg  daily EXCEPT for 2 mg on Mondays.  Recheck in 4 weeks.    Patient's daughter [name] present and verbalizes understanding of instructions.

## 2017-03-08 ENCOUNTER — Telehealth: Payer: Self-pay | Admitting: *Deleted

## 2017-03-08 NOTE — Telephone Encounter (Signed)
Copied from [place] [id number]. Topic: Quick Communication - See Telephone Encounter >> Mar 08, 2017  8:40 AM Bea Graff, NT wrote: CRM for notification. See Telephone encounter for: Theris from [facility] is calling needing verbal auth for warfarin tablets 1mg  and verify the directions. CB# [phone] REF#: [id number]  03/08/17.

## 2017-03-08 NOTE — Telephone Encounter (Signed)
Spoke with mail order pharmacy and verified R/X for 1mg  warfarin - 1 pill daily as directed by coumadin clinic.  #90, 1 refill.  Pharmacist will process immediately and send out to the patient.

## 2017-03-16 ENCOUNTER — Other Ambulatory Visit: Payer: Self-pay

## 2017-03-16 MED ORDER — OMEPRAZOLE 20 MG PO CPDR: 20.0000 mg | DELAYED_RELEASE_CAPSULE | Freq: Every day | ORAL | 3 refills | Status: DC

## 2017-03-16 MED ORDER — SOTALOL HCL 120 MG PO TABS: ORAL_TABLET | ORAL | 3 refills | Status: DC

## 2017-03-31 ENCOUNTER — Ambulatory Visit: Payer: Self-pay

## 2017-04-01 ENCOUNTER — Ambulatory Visit: Payer: Self-pay

## 2017-04-07 ENCOUNTER — Ambulatory Visit (INDEPENDENT_AMBULATORY_CARE_PROVIDER_SITE_OTHER): Payer: Medicare Other | Admitting: General Practice

## 2017-04-07 DIAGNOSIS — Z5181 Encounter for therapeutic drug level monitoring: Secondary | ICD-10-CM

## 2017-04-07 DIAGNOSIS — I480 Paroxysmal atrial fibrillation: Secondary | ICD-10-CM

## 2017-04-07 DIAGNOSIS — Z7901 Long term (current) use of anticoagulants: Secondary | ICD-10-CM | POA: Diagnosis not present

## 2017-04-07 LAB — POCT INR: INR: 2.1

## 2017-04-07 NOTE — Patient Instructions (Addendum)
Pre visit review using our clinic review tool, if applicable. No additional management support is needed unless otherwise documented below in the visit note.  Continue taking 3mg  daily EXCEPT for 2 mg on Mondays.  Recheck in 4 weeks.    Patient's daughter [name] present and verbalizes understanding of instructions.

## 2017-04-18 ENCOUNTER — Telehealth: Payer: Self-pay

## 2017-04-18 NOTE — Telephone Encounter (Signed)
Per med list and Dr. Donivan Scull last note, the patient takes HCTZ 25 mg once daily as needed.  OK to refill.

## 2017-04-18 NOTE — Telephone Encounter (Signed)
[facility] is requesting a refill on HCTZ.  It's unclear to whom originally prescribed the medication. Please advise if okay to refill.

## 2017-04-20 MED ORDER — HYDROCHLOROTHIAZIDE 25 MG PO TABS: 25.0000 mg | ORAL_TABLET | ORAL | 3 refills | Status: DC | PRN

## 2017-04-28 ENCOUNTER — Other Ambulatory Visit: Payer: Self-pay | Admitting: Internal Medicine

## 2017-04-28 NOTE — Telephone Encounter (Signed)
Approved: #60 x 0 

## 2017-04-28 NOTE — Telephone Encounter (Signed)
Left refill on voice mail at pharmacy  

## 2017-04-28 NOTE — Telephone Encounter (Signed)
Last filled 02-25-17 #70 Last OV 12-21-16 Next OV 08-16-17

## 2017-05-02 ENCOUNTER — Other Ambulatory Visit: Payer: Self-pay | Admitting: Internal Medicine

## 2017-05-05 ENCOUNTER — Ambulatory Visit (INDEPENDENT_AMBULATORY_CARE_PROVIDER_SITE_OTHER): Payer: Medicare Other | Admitting: General Practice

## 2017-05-05 DIAGNOSIS — Z7901 Long term (current) use of anticoagulants: Secondary | ICD-10-CM | POA: Diagnosis not present

## 2017-05-05 LAB — POCT INR: INR: 2.5

## 2017-05-05 NOTE — Patient Instructions (Addendum)
Pre visit review using our clinic review tool, if applicable. No additional management support is needed unless otherwise documented below in the visit note.  Continue taking 3mg  daily EXCEPT for 2 mg on Mondays.  Recheck in 4 weeks.    Patient's daughter [name] present and verbalizes understanding of instructions.

## 2017-05-12 ENCOUNTER — Other Ambulatory Visit: Payer: Self-pay

## 2017-05-12 ENCOUNTER — Emergency Department: Payer: Medicare Other

## 2017-05-12 ENCOUNTER — Emergency Department
Admission: EM | Admit: 2017-05-12 | Discharge: 2017-05-12 | Disposition: A | Discharge status: Home / Self Care | Payer: Medicare Other | Attending: Emergency Medicine | Admitting: Emergency Medicine

## 2017-05-12 DIAGNOSIS — I130 Hypertensive heart and chronic kidney disease with heart failure and stage 1 through stage 4 chronic kidney disease, or unspecified chronic kidney disease: Secondary | ICD-10-CM | POA: Diagnosis not present

## 2017-05-12 DIAGNOSIS — I5032 Chronic diastolic (congestive) heart failure: Secondary | ICD-10-CM | POA: Insufficient documentation

## 2017-05-12 DIAGNOSIS — Y9389 Activity, other specified: Secondary | ICD-10-CM | POA: Insufficient documentation

## 2017-05-12 DIAGNOSIS — N184 Chronic kidney disease, stage 4 (severe): Secondary | ICD-10-CM | POA: Diagnosis not present

## 2017-05-12 DIAGNOSIS — E039 Hypothyroidism, unspecified: Secondary | ICD-10-CM | POA: Diagnosis not present

## 2017-05-12 DIAGNOSIS — Y999 Unspecified external cause status: Secondary | ICD-10-CM | POA: Insufficient documentation

## 2017-05-12 DIAGNOSIS — S0101XA Laceration without foreign body of scalp, initial encounter: Secondary | ICD-10-CM | POA: Diagnosis not present

## 2017-05-12 DIAGNOSIS — Y92120 Kitchen in nursing home as the place of occurrence of the external cause: Secondary | ICD-10-CM | POA: Insufficient documentation

## 2017-05-12 DIAGNOSIS — W07XXXA Fall from chair, initial encounter: Secondary | ICD-10-CM | POA: Diagnosis not present

## 2017-05-12 DIAGNOSIS — Z7901 Long term (current) use of anticoagulants: Secondary | ICD-10-CM | POA: Insufficient documentation

## 2017-05-12 DIAGNOSIS — W19XXXA Unspecified fall, initial encounter: Secondary | ICD-10-CM | POA: Diagnosis not present

## 2017-05-12 DIAGNOSIS — S0990XA Unspecified injury of head, initial encounter: Secondary | ICD-10-CM | POA: Diagnosis not present

## 2017-05-12 DIAGNOSIS — I10 Essential (primary) hypertension: Secondary | ICD-10-CM | POA: Diagnosis not present

## 2017-05-12 DIAGNOSIS — R51 Headache: Secondary | ICD-10-CM | POA: Diagnosis not present

## 2017-05-12 LAB — BASIC METABOLIC PANEL
ANION GAP: 8 (ref 5–15)
BUN: 41 mg/dL — ABNORMAL HIGH (ref 6–20)
CALCIUM: 9.7 mg/dL (ref 8.9–10.3)
CHLORIDE: 100 mmol/L — AB (ref 101–111)
CO2: 27 mmol/L (ref 22–32)
Creatinine, Ser: 2.15 mg/dL — ABNORMAL HIGH (ref 0.44–1.00)
GFR calc Af Amer: 21 mL/min — ABNORMAL LOW (ref >60)
GFR calc non Af Amer: 18 mL/min — ABNORMAL LOW (ref >60)
GLUCOSE: 148 mg/dL — AB (ref 65–99)
POTASSIUM: 4 mmol/L (ref 3.5–5.1)
Sodium: 135 mmol/L (ref 135–145)

## 2017-05-12 LAB — CBC
HEMATOCRIT: 32.5 % — AB (ref 35.0–47.0)
HEMOGLOBIN: 10.8 g/dL — AB (ref 12.0–16.0)
MCH: 28.8 pg (ref 26.0–34.0)
MCHC: 33.4 g/dL (ref 32.0–36.0)
MCV: 86.2 fL (ref 80.0–100.0)
Platelets: 228 10*3/uL (ref 150–440)
RBC: 3.77 MIL/uL — AB (ref 3.80–5.20)
RDW: 14.1 % (ref 11.5–14.5)
WBC: 5.7 10*3/uL (ref 3.6–11.0)

## 2017-05-12 LAB — PROTIME-INR
INR: 2.11
Prothrombin Time: 23.5 seconds — ABNORMAL HIGH (ref 11.4–15.2)

## 2017-05-12 LAB — APTT: aPTT: 41 seconds — ABNORMAL HIGH (ref 24–36)

## 2017-05-12 NOTE — ED Provider Notes (Signed)
[facility] Emergency Department Provider Note       Time seen: ----------------------------------------- 7:31 PM on 05/12/2017 -----------------------------------------   I have reviewed the triage vital signs and the nursing notes.  HISTORY   Chief Complaint Head Injury    HPI Amanda [name] is a 84 y.o. female with a history of atrial fibrillation on Coumadin, GERD, and hypertension, who presents to the ED for a fall that occurred while at [facility].  Patient was on a stool in the kitchen and lost her balance and fell backwards.  She struck her head on a hard tile floor.  She was noted to have significant bleeding on her scalp.  She is hard of hearing, denies any complaints, recent illness, amnesia, dizziness or severe headache.  Past Medical History:  Diagnosis Date  . Atrial fibrillation ([place])   . Depression   . GERD (gastroesophageal reflux disease)   . Hyperlipidemia   . Hypertension   . Hypothyroidism    after Rx for thyroid cancer  . Mitral regurgitation   . Osteoarthritis   . Pacemaker   . Sleep disturbance   . Urinary incontinence    usually from UTI's    Patient Active Problem List   Diagnosis Date Noted  . Long term (current) use of anticoagulants 04/07/2017  . Inguinal hernia 12/21/2016  . Neuropathy 10/08/2016  . History of recent fall 09/14/2016  . Low back pain 09/14/2016  . Pedal edema 09/14/2016  . Left rib fracture 06/11/2016  . Paroxysmal atrial fibrillation ([place]) 04/20/2016  . Anemia of chronic renal failure, stage 4 (severe) ([place]) 02/05/2015  . Recurrent cystitis 02/25/2014  . Routine general medical examination at a health care facility 07/16/2013  . Thoracic aorta atherosclerosis ([place]) 07/16/2013  . Encounter for therapeutic drug monitoring 06/11/2013  . Chronic diastolic CHF (congestive heart failure) ([place]) 05/29/2013  . Chronic renal disease, stage IV ([place]) 12/16/2011  . GERD (gastroesophageal reflux disease)   .  SICK SINUS/ TACHY-BRADY SYNDROME 12/15/2009  . PPM-Medtronic 12/15/2009  . Mitral valve disorder 12/08/2009  . ACTINIC KERATOSIS 07/23/2009  . Episodic mood disorder ([place]) 05/05/2009  . HEARING LOSS 04/25/2009  . Hypothyroidism 12/27/2008  . Hyperlipidemia 12/27/2008  . Essential hypertension 12/27/2008  . ATRIAL FIBRILLATION 12/27/2008  . OSTEOARTHRITIS 12/27/2008  . SLEEP DISORDER 12/27/2008  . URINARY INCONTINENCE 12/27/2008    Past Surgical History:  Procedure Laterality Date  . BREAST BIOPSY  2002  . BREAST LUMPECTOMY  1983  . CATARACT EXTRACTION, BILATERAL    . EP IMPLANTABLE DEVICE N/A 09/02/2015   Procedure: PPM Generator Changeout;  Surgeon: Evans Lance, MD;  Location: [facility];  Service: Cardiovascular;  Laterality: N/A;  . Goiter Removed  1976   cancerous. Then got RAI  . PACEMAKER INSERTION  5/07   after syncope  . TONSILLECTOMY AND ADENOIDECTOMY  1937  . VESICOVAGINAL FISTULA CLOSURE W/ TAH  1972    Allergies Amoxicillin; Augmentin [amoxicillin-pot clavulanate]; and Sulfonamide derivatives  Social History Social History   Tobacco Use  . Smoking status: Never Smoker  . Smokeless tobacco: Never Used  Substance Use Topics  . Alcohol use: No  . Drug use: No    Review of Systems Constitutional: Negative for fever. Cardiovascular: Negative for chest pain. Respiratory: Negative for shortness of breath. Gastrointestinal: Negative for abdominal pain, vomiting and diarrhea. Genitourinary: Negative for dysuria. Musculoskeletal: Negative for back pain. Skin: Positive for scalp laceration Neurological: positive for headache  All systems negative/normal/unremarkable except as stated in  the HPI  ____________________________________________   PHYSICAL EXAM:  VITAL SIGNS: ED Triage Vitals [05/12/17 1601]  Enc Vitals Group     BP (!) 155/63     Pulse Rate 61     Resp 16     Temp 98.3 F (36.8 C)     Temp Source Oral     SpO2 98 %      Weight 118 lb (53.5 kg)     Height 5\' 5"  (1.651 m)     Head Circumference      Peak Flow      Pain Score 7     Pain Loc      Pain Edu?      Excl. in [place]?     Constitutional: Alert and oriented. Well appearing and in no distress. Eyes: Conjunctivae are normal. Normal extraocular movements. ENT   Head: Normocephalic, significant bleeding and parietal scalp hematoma from a 3.5 cm scalp laceration in the midline   Nose: No congestion/rhinnorhea.   Mouth/Throat: Mucous membranes are moist.   Neck: No stridor. Cardiovascular: Normal rate, regular rhythm. No murmurs, rubs, or gallops. Respiratory: Normal respiratory effort without tachypnea nor retractions. Breath sounds are clear and equal bilaterally. No wheezes/rales/rhonchi. Gastrointestinal: Soft and nontender. Normal bowel sounds Musculoskeletal: Nontender with normal range of motion in extremities. No lower extremity tenderness nor edema. Neurologic:  Normal speech and language. No gross focal neurologic deficits are appreciated.  Skin: 3.5 cm scalp laceration is noted in the midline parietal scalp Psychiatric: Mood and affect are normal. Speech and behavior are normal.  ____________________________________________  EKG: Interpreted by me.  Atrial paced rhythm with prolonged AV conduction, rate is 60 bpm.  ____________________________________________  ED COURSE:  As part of my medical decision making, I reviewed the following data within the [place] History obtained from family if available, nursing notes, old chart and ekg, as well as notes from prior ED visits. Patient presented for head injury, we will assess with labs and imaging as indicated at this time.   Marland Kitchen.Laceration Repair Date/Time: 05/12/2017 7:34 PM Performed by: Earleen Newport, MD Authorized by: Earleen Newport, MD   Consent:    Consent obtained:  Verbal   Consent given by:  Patient Anesthesia (see MAR for exact  dosages):    Anesthesia method:  None Laceration details:    Location:  Scalp   Scalp location:  R parietal   Length (cm):  3.5   Depth (mm):  5 Repair type:    Repair type:  Simple Pre-procedure details:    Preparation:  Patient was prepped and draped in usual sterile fashion Exploration:    Contaminated: no   Treatment:    Amount of cleaning:  Standard   Irrigation solution:  Sterile saline   Irrigation method:  Pressure wash   Visualized foreign bodies/material removed: no   Skin repair:    Repair method:  Staples   Number of staples:  6 Approximation:    Approximation:  Close   [place] border: well-aligned   Post-procedure details:    Dressing:  Open (no dressing)   Patient tolerance of procedure:  Tolerated well, no immediate complications   ____________________________________________   LABS (pertinent positives/negatives)  Labs Reviewed  PROTIME-INR - Abnormal; Notable for the following components:      Result Value   Prothrombin Time 23.5 (*)    All other components within normal limits  APTT - Abnormal; Notable for the following components:   aPTT 41 (*)  All other components within normal limits  CBC - Abnormal; Notable for the following components:   RBC 3.77 (*)    Hemoglobin 10.8 (*)    HCT 32.5 (*)    All other components within normal limits  BASIC METABOLIC PANEL - Abnormal; Notable for the following components:   Chloride 100 (*)    Glucose, Bld 148 (*)    BUN 41 (*)    Creatinine, Ser 2.15 (*)    GFR calc non Af Amer 18 (*)    GFR calc Af Amer 21 (*)    All other components within normal limits    RADIOLOGY  CT head IMPRESSION: No acute intracranial abnormality.  Atrophy and evidence for chronic small vessel ischemic changes.   ____________________________________________  DIFFERENTIAL DIAGNOSIS   Subdural hematoma, skull fracture, subarachnoid hemorrhage, electrolyte abnormality, anemia, laceration  FINAL ASSESSMENT AND  PLAN  Fall, minor head injury, scalp laceration, chronic kidney disease   Plan: Patient had presented for fall with head injury and scalp laceration that was repaired as dictated above. Patient's labs are reportedly at her baseline.  Family is aware of her chronic kidney disease.  Coumadin level is therapeutic. Patient's imaging did not reveal any intracranial process.  She is stable for outpatient follow-up.   Earleen Newport, MD   Note: This note was generated in part or whole with voice recognition software. Voice recognition is usually quite accurate but there are transcription errors that can and very often do occur. I apologize for any typographical errors that were not detected and corrected.     Earleen Newport, MD 05/12/17 Joen Laura

## 2017-05-12 NOTE — ED Notes (Signed)
MD Jimmye Norman at bedside. MD and this RN cleaned laceration site with sterile saline. MD placed 6 staples to laceration. This RN cleaned hair/head surround laceration site. Patient given clean top to wear home.

## 2017-05-12 NOTE — ED Notes (Signed)
Reviewed discharge instructions, follow-up care, signs of infection, and laceration care/staple care with patient. Patient verbalized understanding of all information reviewed. Patient stable, with no distress noted at this time.

## 2017-05-12 NOTE — ED Triage Notes (Signed)
Pt arrives [facility] from [facility]. Pt was on a stool in kitchen, lost balance, fell backward. Head wrapped in gauze. Blood noted to shirt and posterior head. Pt takes coudamin. Pt talking in complete sentences, alert, oriented. Denies blurred vision, denies room spinning. Pt is HOH.

## 2017-05-13 ENCOUNTER — Telehealth: Payer: Self-pay

## 2017-05-13 NOTE — Telephone Encounter (Signed)
Spoke to daughter, [name]. She said she is doing well. No symptoms of concussion.

## 2017-05-17 ENCOUNTER — Ambulatory Visit (INDEPENDENT_AMBULATORY_CARE_PROVIDER_SITE_OTHER): Payer: Medicare Other | Admitting: *Deleted

## 2017-05-17 DIAGNOSIS — I495 Sick sinus syndrome: Secondary | ICD-10-CM

## 2017-05-17 NOTE — Progress Notes (Signed)
Remote pacemaker transmission.   

## 2017-05-19 ENCOUNTER — Encounter: Payer: Self-pay | Admitting: Cardiology

## 2017-05-23 ENCOUNTER — Telehealth: Payer: Self-pay | Admitting: Internal Medicine

## 2017-05-23 NOTE — Telephone Encounter (Signed)
Copied from [place]. Topic: Quick Communication - See Telephone Encounter >> May 23, 2017  9:35 AM Cleaster Corin, NT wrote: CRM for notification. See Telephone encounter for:   05/23/17.Manuela Schwartz calling from [facility] needed an order to remove staples from pt. A fax will be sent over. Pt. Was seen in the ed 10 days ago and needs order to have them removed today. Manuela Schwartz can be reached at [phone]. (also sending a fax over)

## 2017-05-23 NOTE — Telephone Encounter (Signed)
Left a message on [place] with verbal order.

## 2017-05-23 NOTE — Telephone Encounter (Signed)
Okay to give a verbal order and I can sign written when it gets to me

## 2017-06-01 LAB — CUP PACEART REMOTE DEVICE CHECK
Battery Remaining Longevity: 133 mo
Battery Voltage: 2.79 V
Brady Statistic AP VP Percent: 4 %
Date Time Interrogation Session: 20190129150343
Implantable Lead Implant Date: 20070524
Implantable Lead Implant Date: 20070524
Implantable Lead Location: 753860
Implantable Lead Model: 5568
Implantable Pulse Generator Implant Date: 20170516
Lead Channel Impedance Value: 742 Ohm
Lead Channel Pacing Threshold Amplitude: 0.75 V
Lead Channel Setting Pacing Amplitude: 2.5 V
Lead Channel Setting Pacing Pulse Width: 0.4 ms
MDC IDC LEAD LOCATION: 753859
MDC IDC MSMT BATTERY IMPEDANCE: 103 Ohm
MDC IDC MSMT LEADCHNL RA PACING THRESHOLD AMPLITUDE: 1.25 V
MDC IDC MSMT LEADCHNL RA PACING THRESHOLD PULSEWIDTH: 0.4 ms
MDC IDC MSMT LEADCHNL RV IMPEDANCE VALUE: 470 Ohm
MDC IDC MSMT LEADCHNL RV PACING THRESHOLD PULSEWIDTH: 0.4 ms
MDC IDC SET LEADCHNL RV PACING AMPLITUDE: 2.5 V
MDC IDC SET LEADCHNL RV SENSING SENSITIVITY: 2 mV
MDC IDC STAT BRADY AP VS PERCENT: 93 %
MDC IDC STAT BRADY AS VP PERCENT: 0 %
MDC IDC STAT BRADY AS VS PERCENT: 3 %

## 2017-06-02 ENCOUNTER — Ambulatory Visit (INDEPENDENT_AMBULATORY_CARE_PROVIDER_SITE_OTHER): Payer: Medicare Other | Admitting: General Practice

## 2017-06-02 ENCOUNTER — Ambulatory Visit: Payer: Self-pay

## 2017-06-02 DIAGNOSIS — I480 Paroxysmal atrial fibrillation: Secondary | ICD-10-CM | POA: Diagnosis not present

## 2017-06-02 DIAGNOSIS — Z7901 Long term (current) use of anticoagulants: Secondary | ICD-10-CM | POA: Diagnosis not present

## 2017-06-02 LAB — POCT INR: INR: 2.2

## 2017-06-02 NOTE — Patient Instructions (Signed)
Pre visit review using our clinic review tool, if applicable. No additional management support is needed unless otherwise documented below in the visit note. 

## 2017-06-09 ENCOUNTER — Other Ambulatory Visit: Payer: Self-pay | Admitting: Internal Medicine

## 2017-06-10 NOTE — Telephone Encounter (Signed)
Patient is compliant with coumadin management, will refill X 6 months.   

## 2017-06-21 ENCOUNTER — Encounter: Payer: Self-pay | Admitting: Internal Medicine

## 2017-06-21 ENCOUNTER — Ambulatory Visit (INDEPENDENT_AMBULATORY_CARE_PROVIDER_SITE_OTHER): Payer: Medicare Other | Admitting: Internal Medicine

## 2017-06-21 VITALS — BP 120/64 | HR 78 | Ht 62.0 in | Wt 115.5 lb

## 2017-06-21 DIAGNOSIS — I495 Sick sinus syndrome: Secondary | ICD-10-CM | POA: Diagnosis not present

## 2017-06-21 DIAGNOSIS — I480 Paroxysmal atrial fibrillation: Secondary | ICD-10-CM

## 2017-06-21 DIAGNOSIS — Z79899 Other long term (current) drug therapy: Secondary | ICD-10-CM | POA: Diagnosis not present

## 2017-06-21 DIAGNOSIS — Z950 Presence of cardiac pacemaker: Secondary | ICD-10-CM | POA: Diagnosis not present

## 2017-06-21 NOTE — Patient Instructions (Signed)
Medication Instructions: - Your physician recommends that you continue on your current medications as directed. Please refer to the Current Medication list given to you today.  Labwork: - Your physician recommends that you have lab work today: BMP/ Magnesium  Procedures/Testing: - none ordered  Follow-Up: - Remote monitoring is used to monitor your Pacemaker of ICD from home. This monitoring reduces the number of office visits required to check your device to one time per year. It allows [place] to keep an eye on the functioning of your device to ensure it is working properly. You are scheduled for a device check from home on 08/16/17. You may send your transmission at any time that day. If you have a wireless device, the transmission will be sent automatically. After your physician reviews your transmission, you will receive a postcard with your next transmission date.  - Your physician wants you to follow-up in: 1 year with Dr. Caryl Comes. You will receive a reminder letter in the mail two months in advance. If you don't receive a letter, please call our office to schedule the follow-up appointment.   Any Additional Special Instructions Will Be Listed Below (If Applicable).     If you need a refill on your cardiac medications before your next appointment, please call your pharmacy.

## 2017-06-21 NOTE — Progress Notes (Signed)
Patient Care Team: Venia Carbon, MD as PCP - General Rockey Situ Kathlene November, MD as Consulting Physician (Cardiology)   HPI  Amanda [name] is a 84 y.o. female seen in followup with a previously implanted pacemaker in 2007 for syncope and apparently tachybradycardia syndrome; she has a history of paroxysmal atrial fibrillation managed with sotalol. She underwent generator replacement 5/17   She has done well with adjunctive diltiazem.   She has had intermittent atrial fib; it has largely been asymptomatic    2/15 Echo EF    65% mod MR    Date Cr K Mg Hgb  6/17  1.76 4.3    4/18  1.28 4.1  10.3  1/19 2.15 4.0  10.8   She was seen in the ER 1/19 following a fall.  Denies palpitations; fairly limited activity.  No chest pain or edema  Her biggest complaint is pain and tingling of her feet " not worth living."   Has some tingling in her hands  Past Medical History:  Diagnosis Date  . Atrial fibrillation ([place])   . Depression   . GERD (gastroesophageal reflux disease)   . Hyperlipidemia   . Hypertension   . Hypothyroidism    after Rx for thyroid cancer  . Mitral regurgitation   . Osteoarthritis   . Pacemaker   . Sleep disturbance   . Urinary incontinence    usually from UTI's    Past Surgical History:  Procedure Laterality Date  . BREAST BIOPSY  2002  . BREAST LUMPECTOMY  1983  . CATARACT EXTRACTION, BILATERAL    . EP IMPLANTABLE DEVICE N/A 09/02/2015   Procedure: PPM Generator Changeout;  Surgeon: Evans Lance, MD;  Location: [facility];  Service: Cardiovascular;  Laterality: N/A;  . Goiter Removed  1976   cancerous. Then got RAI  . PACEMAKER INSERTION  5/07   after syncope  . TONSILLECTOMY AND ADENOIDECTOMY  1937  . VESICOVAGINAL FISTULA CLOSURE W/ TAH  1972    Current Outpatient Medications  Medication Sig Dispense Refill  . acetaminophen (TYLENOL) 500 MG tablet Take 500 mg by mouth every 8 (eight) hours as needed for mild pain.     .  cholecalciferol (VITAMIN D) 1000 UNITS tablet Take 1,000 Units by mouth daily.    Marland Kitchen diltiazem (CARDIZEM CD) 120 MG 24 hr capsule TAKE 1 CAPSULE BY MOUTH  DAILY 90 capsule 3  . hydrochlorothiazide (HYDRODIURIL) 25 MG tablet Take 1 tablet (25 mg total) by mouth as needed. 90 tablet 3  . levothyroxine (SYNTHROID, LEVOTHROID) 75 MCG tablet TAKE 1 TABLET BY MOUTH  DAILY 90 tablet 3  . mirtazapine (REMERON) 15 MG tablet TAKE 1 TABLET BY MOUTH AT  BEDTIME 90 tablet 3  . nitroGLYCERIN (NITROSTAT) 0.4 MG SL tablet Place 0.4 mg under the tongue every 5 (five) minutes as needed for chest pain (x 3 doses).     Marland Kitchen omeprazole (PRILOSEC) 20 MG capsule Take 1 capsule (20 mg total) by mouth daily. 90 capsule 3  . sotalol (BETAPACE) 120 MG tablet TAKE ONE-HALF TABLET BY  MOUTH TWO TIMES DAILY 90 tablet 3  . traMADol (ULTRAM) 50 MG tablet TAKE 1/2-1 TABLET BY MOUTH 3 TIMES DAILY AS NEEDED FOR PAIN 70 tablet 0  . trimethoprim (TRIMPEX) 100 MG tablet TAKE 1 TABLET BY MOUTH  DAILY 90 tablet 1  . warfarin (COUMADIN) 1 MG tablet TAKE 1 TABLET BY MOUTH  DAILY AS DIRECTED BY  ANTICOAGULATION CLINIC 90 tablet  1  . warfarin (COUMADIN) 4 MG tablet Take daily as directed by coumadin clinic. 90 tablet 1   No current facility-administered medications for this visit.     Allergies  Allergen Reactions  . Amoxicillin Diarrhea    Weakness and atrial fib  . Augmentin [Amoxicillin-Pot Clavulanate] Diarrhea    Weakness and atrial fib  . Sulfonamide Derivatives     unknown    Review of Systems negative except from HPI and PMH  Physical Exam BP 120/64 (BP Location: Left Arm, Patient Position: Sitting, Cuff Size: Normal)   Pulse 78   Ht 5\' 2"  (1.575 m)   Wt 115 lb 8 oz (52.4 kg)   BMI 21.13 kg/m  Well developed and nourished in no acute distress HENT normal Neck supple with JVP-flat Clear Regular rate and rhythm, 2/6 murmurs or gallops Abd-soft with active BS No Clubbing cyanosis edema Skin-warm and dry A & Oriented   Grossly normal sensory and motor function  ECG demonstrates atrial pacing at 78 24/08/36 Nonspecific ST-T changes in the context of LVH   Assessment and  Plan  Atrial fibrillation with some undersensing  Pacemaker-Medtronic  The patient's device was interrogated and the information was fully reviewed.  The device was reprogrammed to adjust rate response -down   High risk medication surveillance-sotalol  Renal insufficiency Gd 4>>3  Hypertension  Leg pain/restless legs  Fall    There has been interval worsening in kidney function.  We will check it again today  Her major complaint is her leg discomfort.  She says it is bad enough that he does not want to live like this.  Have suggested it may be a drug effect.  Could also be a neuropathy as she has some tingling in her hands.  I suggested that they take serial elimination of her medications starting with the omeprazole.  If this does not identify culprit, she can follow-up with Dr. Silvio Pate for consideration of a neurological consult.  Device function is normal.  There is been infrequent atrial fibrillation.  No significant bleeding-hemoglobin stable   We spent more than 50% of our >25 min visit in face to face counseling regarding the above

## 2017-06-22 LAB — BASIC METABOLIC PANEL
BUN / CREAT RATIO: 19 (ref 12–28)
BUN: 39 mg/dL — ABNORMAL HIGH (ref 10–36)
CO2: 25 mmol/L (ref 20–29)
Calcium: 9.5 mg/dL (ref 8.7–10.3)
Chloride: 99 mmol/L (ref 96–106)
Creatinine, Ser: 2.04 mg/dL — ABNORMAL HIGH (ref 0.57–1.00)
GFR calc Af Amer: 23 mL/min/{1.73_m2} — ABNORMAL LOW (ref >59)
GFR calc non Af Amer: 20 mL/min/{1.73_m2} — ABNORMAL LOW (ref >59)
GLUCOSE: 91 mg/dL (ref 65–99)
POTASSIUM: 4.4 mmol/L (ref 3.5–5.2)
SODIUM: 138 mmol/L (ref 134–144)

## 2017-06-22 LAB — MAGNESIUM: MAGNESIUM: 1.5 mg/dL — AB (ref 1.6–2.3)

## 2017-06-25 ENCOUNTER — Other Ambulatory Visit: Payer: Self-pay | Admitting: Internal Medicine

## 2017-06-27 ENCOUNTER — Ambulatory Visit (INDEPENDENT_AMBULATORY_CARE_PROVIDER_SITE_OTHER): Payer: Medicare Other | Admitting: Internal Medicine

## 2017-06-27 ENCOUNTER — Ambulatory Visit: Payer: Self-pay | Admitting: *Deleted

## 2017-06-27 ENCOUNTER — Ambulatory Visit: Payer: Medicare Other

## 2017-06-27 ENCOUNTER — Encounter: Payer: Self-pay | Admitting: Internal Medicine

## 2017-06-27 VITALS — BP 124/64 | HR 60 | Temp 98.2°F | Wt 117.2 lb

## 2017-06-27 DIAGNOSIS — N390 Urinary tract infection, site not specified: Secondary | ICD-10-CM

## 2017-06-27 DIAGNOSIS — N184 Chronic kidney disease, stage 4 (severe): Secondary | ICD-10-CM | POA: Diagnosis not present

## 2017-06-27 DIAGNOSIS — I480 Paroxysmal atrial fibrillation: Secondary | ICD-10-CM

## 2017-06-27 DIAGNOSIS — Z7901 Long term (current) use of anticoagulants: Secondary | ICD-10-CM

## 2017-06-27 DIAGNOSIS — R300 Dysuria: Secondary | ICD-10-CM

## 2017-06-27 LAB — POC URINALSYSI DIPSTICK (AUTOMATED)
Bilirubin, UA: NEGATIVE
Glucose, UA: NEGATIVE
Ketones, UA: NEGATIVE
NITRITE UA: POSITIVE
PH UA: 8 (ref 5.0–8.0)
Protein, UA: NEGATIVE
RBC UA: NEGATIVE
Spec Grav, UA: 1.015 (ref 1.010–1.025)
UROBILINOGEN UA: 0.2 U/dL

## 2017-06-27 LAB — POCT INR: INR: 2.6

## 2017-06-27 MED ORDER — CEFUROXIME AXETIL 250 MG PO TABS: 250.0000 mg | ORAL_TABLET | Freq: Two times a day (BID) | ORAL | 1 refills | Status: DC

## 2017-06-27 NOTE — Addendum Note (Signed)
Addended by: Magdalen Spatz C on: 06/27/2017 01:48 PM   Modules accepted: Orders

## 2017-06-27 NOTE — Assessment & Plan Note (Addendum)
Had done well since on the trimethoprim I am concerned about retention---will have our RN cath to be sure no large residual In and out cath only showed 40cc Will just treat with cefuroxime Send culture

## 2017-06-27 NOTE — Patient Instructions (Signed)
Please take the HCTZ only if your ankles are swelling in the morning.

## 2017-06-27 NOTE — Assessment & Plan Note (Signed)
Slightly better than in January Will have her take the HCTZ prn only

## 2017-06-27 NOTE — Telephone Encounter (Signed)
Last filled 04-28-17 #70 Last OV 12-21-16 Next OV: Today

## 2017-06-27 NOTE — Telephone Encounter (Addendum)
Pt's daughter, [name], called because th pt is having burning with urination, was confused last night and was having chill; she states that the burning has been going on for a couple of days; nurse triage initiated and recommendations made per protocol to include seeing a physician within 24 hours; pt's daughter offered and accepted an appoijntment with Dr Silvio Pate at [facility], today at [place]; she verbalizes understanding; spoke with Mearl Latin concerning this appointment.   Reason for Disposition . Bad or foul-smelling urine  Answer Assessment - Initial Assessment Questions 1. SYMPTOM: "What's the main symptom you're concerned about?" (e.g., frequency, incontinence)     burning with urination 2. ONSET: "When did the  ________  start?"     Friday 06/24/17 3. PAIN: "Is there any pain?" If so, ask: "How bad is it?" (Scale: 1-10; mild, moderate, severe)     mild 4. CAUSE: "What do you think is causing the symptoms?"     uti 5. OTHER SYMPTOMS: "Do you have any other symptoms?" (e.g., fever, flank pain, blood in urine, pain with urination)     Confusion, chills 6. PREGNANCY: "Is there any chance you are pregnant?" "When was your last menstrual period?"     no  Protocols used: URINARY [facility]

## 2017-06-27 NOTE — Progress Notes (Signed)
Subjective:    Patient ID: Amanda [name], female    DOB: 04-05-1920, 84 y.o.   MRN: [id number]  HPI Here with daughters due to dysuria  Having burning dysuria Gets urgency and then doesn't go much Still on the trimethoprim prevention---no infection since then No blood Started 2 days ago  No apparent fever but had some chills last night  Has just had recheck of kidney function due to changes  Current Outpatient Medications on File Prior to Visit  Medication Sig Dispense Refill  . acetaminophen (TYLENOL) 500 MG tablet Take 500 mg by mouth every 8 (eight) hours as needed for mild pain.     . cholecalciferol (VITAMIN D) 1000 UNITS tablet Take 1,000 Units by mouth daily.    Marland Kitchen diltiazem (CARDIZEM CD) 120 MG 24 hr capsule TAKE 1 CAPSULE BY MOUTH  DAILY 90 capsule 3  . hydrochlorothiazide (HYDRODIURIL) 25 MG tablet Take 1 tablet (25 mg total) by mouth as needed. 90 tablet 3  . levothyroxine (SYNTHROID, LEVOTHROID) 75 MCG tablet TAKE 1 TABLET BY MOUTH  DAILY 90 tablet 3  . mirtazapine (REMERON) 15 MG tablet TAKE 1 TABLET BY MOUTH AT  BEDTIME 90 tablet 3  . nitroGLYCERIN (NITROSTAT) 0.4 MG SL tablet Place 0.4 mg under the tongue every 5 (five) minutes as needed for chest pain (x 3 doses).     . sotalol (BETAPACE) 120 MG tablet TAKE ONE-HALF TABLET BY  MOUTH TWO TIMES DAILY 90 tablet 3  . traMADol (ULTRAM) 50 MG tablet TAKE 1/2-1 TABLET BY MOUTH 3 TIMES DAILY AS NEEDED FOR PAIN 70 tablet 0  . trimethoprim (TRIMPEX) 100 MG tablet TAKE 1 TABLET BY MOUTH  DAILY 90 tablet 1  . warfarin (COUMADIN) 1 MG tablet TAKE 1 TABLET BY MOUTH  DAILY AS DIRECTED BY  ANTICOAGULATION CLINIC 90 tablet 1  . warfarin (COUMADIN) 4 MG tablet Take daily as directed by coumadin clinic. 90 tablet 1  . omeprazole (PRILOSEC) 20 MG capsule Take 1 capsule (20 mg total) by mouth daily. (Patient not taking: Reported on 06/27/2017) 90 capsule 3   No current facility-administered medications on file prior to visit.      Allergies  Allergen Reactions  . Amoxicillin Diarrhea    Weakness and atrial fib  . Augmentin [Amoxicillin-Pot Clavulanate] Diarrhea    Weakness and atrial fib  . Sulfonamide Derivatives     unknown    Past Medical History:  Diagnosis Date  . Atrial fibrillation ([place])   . Depression   . GERD (gastroesophageal reflux disease)   . Hyperlipidemia   . Hypertension   . Hypothyroidism    after Rx for thyroid cancer  . Mitral regurgitation   . Osteoarthritis   . Pacemaker   . Sleep disturbance   . Urinary incontinence    usually from UTI's    Past Surgical History:  Procedure Laterality Date  . BREAST BIOPSY  2002  . BREAST LUMPECTOMY  1983  . CATARACT EXTRACTION, BILATERAL    . EP IMPLANTABLE DEVICE N/A 09/02/2015   Procedure: PPM Generator Changeout;  Surgeon: Evans Lance, MD;  Location: [facility];  Service: Cardiovascular;  Laterality: N/A;  . Goiter Removed  1976   cancerous. Then got RAI  . PACEMAKER INSERTION  5/07   after syncope  . TONSILLECTOMY AND ADENOIDECTOMY  1937  . VESICOVAGINAL FISTULA CLOSURE W/ TAH  1972    Family History  Problem Relation Age of Onset  . Diabetes Mother   . Stroke  Sister   . Coronary artery disease Brother   . Prostate cancer Brother   . Breast cancer Neg Hx   . Colon cancer Neg Hx     Social History   Socioeconomic History  . Marital status: Widowed    Spouse name: Not on file  . Number of children: 2  . Years of education: Not on file  . Highest education level: Not on file  Social Needs  . Financial resource strain: Not on file  . Food insecurity - worry: Not on file  . Food insecurity - inability: Not on file  . Transportation needs - medical: Not on file  . Transportation needs - non-medical: Not on file  Occupational History  . Occupation: homemaker  Tobacco Use  . Smoking status: Never Smoker  . Smokeless tobacco: Never Used  Substance and Sexual Activity  . Alcohol use: No  . Drug use: No   . Sexual activity: Not on file  Other Topics Concern  . Not on file  Social History Narrative   Widowed 2002. 1 local daughter. Other in [place]. Was homemaker. Farmed and gardened.    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    Daughter [name]) is health care POA.    Discussed DNR and she requests    Would not want tube feeds if cognitively unaware   Review of Systems  No back pain Slight nausea but no vomiting Appetite is pretty good     Objective:   Physical Exam  Constitutional: No distress.  Abdominal: Soft.  Suprapubic distention, tenderness and dullness  Musculoskeletal:  No CVA tenderness          Assessment & Plan:

## 2017-06-27 NOTE — Progress Notes (Addendum)
This writer performed in and out catheterization on patient per MD orders.  Beatriz Stallion, CMA assisted.    Patient educated on procedure and then assisted to undress and prepped on the table.  In and out catheterization performed using sterile technique.  Perineal area was cleansed using sterile betadine solution prior to catheter insertion.  Bladder emptied with a total of 40 cc's of clear, straw colored urine collected without difficulty.    No sign of blood or sediment, with only a slight, foul odor.  Collected specimen is being processed to eval for possible UTI.   Patient tolerated procedure well without pain.  Patient assisted with clean up and getting dressed.  MD notified of all findings and will review with patient and daughter (POA).

## 2017-06-27 NOTE — Patient Instructions (Signed)
INR today 2.6  Patient will be taking Ceftin X 10 days.  Typically not a problem with interaction but will recheck in 2 weeks just to be sure.    Continue taking 3mg  daily EXCEPT for 2 mg on Mondays.  Recheck in 2 weeks.    Patient's daughter [name] present and verbalizes understanding of instructions.

## 2017-06-28 ENCOUNTER — Ambulatory Visit: Payer: Medicare Other

## 2017-06-28 LAB — CUP PACEART INCLINIC DEVICE CHECK
Date Time Interrogation Session: 20190312134023
Implantable Lead Implant Date: 20070524
Implantable Lead Implant Date: 20070524
Implantable Lead Location: 753859
MDC IDC LEAD LOCATION: 753860
MDC IDC PG IMPLANT DT: 20170516

## 2017-06-29 LAB — URINE CULTURE
MICRO NUMBER:: 90307150
SPECIMEN QUALITY: ADEQUATE

## 2017-07-04 ENCOUNTER — Ambulatory Visit: Payer: Medicare Other | Admitting: Internal Medicine

## 2017-07-04 ENCOUNTER — Telehealth: Payer: Self-pay

## 2017-07-04 NOTE — Telephone Encounter (Signed)
Spoke to [place]. Pt is doing better today. Did not need the appt.

## 2017-07-04 NOTE — Telephone Encounter (Signed)
I will see her at 2 if she is having ongoing problems

## 2017-07-04 NOTE — Telephone Encounter (Signed)
I spoke with Amanda [name] (DPR signed) pt was continuing symptoms of UTI on 07/02/17; [name] got refill on abx. Amanda [name] said pt told her she is doing better except still burning sensation over weekend but Amanda [name] has not spoken with pt today; [name] request appt for today at 2 pm incase pt needs to be seen when she speaks with her this morning. If pt does not need to be seen [name] will cb this morning. FYI to Dr Silvio Pate.

## 2017-07-04 NOTE — Telephone Encounter (Signed)
PLEASE NOTE: All timestamps contained within this report are represented as [place] Standard Time. CONFIDENTIALTY NOTICE: This fax transmission is intended only for the addressee. It contains information that is legally privileged, confidential or otherwise protected from use or disclosure. If you are not the intended recipient, you are strictly prohibited from reviewing, disclosing, copying using or disseminating any of this information or taking any action in reliance on or regarding this information. If you have received this fax in error, please notify us immediately by telephone so that we can arrange for its return to [place]. Phone: [phone], Toll-Free: [phone], Fax: [phone] Page: 1 of 2 Call Id: [id number] [place] Patient Name: Amanda [name] Gender: Female DOB: October 02, 1919 Age: 84 Y 3 M 22 D Return Phone Number: [phone] (Primary), [phone] (Secondary) Address: City/State/Zip: [place] [address] Client [facility] Night - Client Client Site [place] Physician Viviana Simpler - MD Contact Type Call Who Is Calling Patient / Member / Family / Caregiver Call Type Triage / Clinical Caller Name Aliene Beams Relationship To Patient Daughter Return Phone Number [phone] (Primary) Chief Complaint Weakness, Generalized Reason for Call Symptomatic / Request for [place] states that her mother has a uti and just finished medication. She still has burning with urination and feeling weak still. Also has discoloration and a smell to her urine. Translation No No Triage Reason Patient declined Nurse Assessment Nurse: Markus Daft, RN, Sherre Poot Date/Time Eilene Ghazi Time): 07/02/2017 9:40:06 AM Confirm and document reason for call. If symptomatic, describe symptoms. ---Caller states that her mother has  a UTI, and just finished antibiotics this am. She still has burning with urination and feeling generalized weak still. Also has discoloration and a smell to her urine. Does the patient have any new or worsening symptoms? ---Yes Will a triage be completed? ---No Select reason for no triage. ---Patient declined Please document clinical information provided and list any resource used. ---Caller is not with pt. Caller wanted antibiotic w/o being seen. Triage declined. Accepted info. about talking to medical provider over the phone/video. Guidelines Guideline Title Affirmed Question Affirmed Notes Nurse Date/Time (Eastern Time) Disp. Time Eilene Ghazi Time) Disposition Final User 07/02/2017 9:46:06 AM Clinical Call Yes Markus Daft, RN, Sherre Poot After Care Instructions Given Call Event Type User Date / Time Description Education document email Jackqulyn Livings 07/02/2017 9:43:51 AM [place] Now Instructions PLEASE NOTE: All timestamps contained within this report are represented as [place] Standard Time. CONFIDENTIALTY NOTICE: This fax transmission is intended only for the addressee. It contains information that is legally privileged, confidential or otherwise protected from use or disclosure. If you are not the intended recipient, you are strictly prohibited from reviewing, disclosing, copying using or disseminating any of this information or taking any action in reliance on or regarding this information. If you have received this fax in error, please notify us immediately by telephone so that we can arrange for its return to [place]. Phone: [phone], Toll-Free: [phone], Fax: [phone] Page: 2 of 2 Call Id: [id number] Referrals [facility]

## 2017-07-12 ENCOUNTER — Ambulatory Visit: Payer: Medicare Other

## 2017-07-12 ENCOUNTER — Ambulatory Visit (INDEPENDENT_AMBULATORY_CARE_PROVIDER_SITE_OTHER): Payer: Medicare Other

## 2017-07-12 DIAGNOSIS — Z7901 Long term (current) use of anticoagulants: Secondary | ICD-10-CM

## 2017-07-12 DIAGNOSIS — I480 Paroxysmal atrial fibrillation: Secondary | ICD-10-CM

## 2017-07-12 LAB — POCT INR: INR: 2.5

## 2017-07-12 NOTE — Patient Instructions (Signed)
INR today 2.5  Continue taking 3mg  daily EXCEPT for 2 mg on Mondays.  Recheck in [place].    Patient and daughter [name] present and verbalize understanding of instructions given today.

## 2017-07-25 ENCOUNTER — Ambulatory Visit: Payer: Self-pay

## 2017-07-25 ENCOUNTER — Ambulatory Visit (INDEPENDENT_AMBULATORY_CARE_PROVIDER_SITE_OTHER): Payer: Medicare Other | Admitting: Family Medicine

## 2017-07-25 ENCOUNTER — Encounter: Payer: Self-pay | Admitting: Family Medicine

## 2017-07-25 VITALS — BP 112/64 | HR 77 | Temp 97.7°F | Ht 62.0 in | Wt 117.2 lb

## 2017-07-25 DIAGNOSIS — N390 Urinary tract infection, site not specified: Secondary | ICD-10-CM | POA: Diagnosis not present

## 2017-07-25 DIAGNOSIS — R300 Dysuria: Secondary | ICD-10-CM

## 2017-07-25 LAB — POC URINALSYSI DIPSTICK (AUTOMATED)
Bilirubin, UA: NEGATIVE
Blood, UA: 25
GLUCOSE UA: NEGATIVE
KETONES UA: NEGATIVE
Nitrite, UA: NEGATIVE
PROTEIN UA: NEGATIVE
SPEC GRAV UA: 1.02 (ref 1.010–1.025)
Urobilinogen, UA: 0.2 E.U./dL
pH, UA: 6 (ref 5.0–8.0)

## 2017-07-25 MED ORDER — CEFUROXIME AXETIL 250 MG PO TABS: 250.0000 mg | ORAL_TABLET | Freq: Two times a day (BID) | ORAL | 0 refills | Status: DC

## 2017-07-25 NOTE — Telephone Encounter (Signed)
Daughter,[name], reports pt. Finished antibiotics "from the last bladder infection and she did good for awhile. She woke up this morning with pain and burning again." No availability in office today and pt. Does not want to go to another [place]. Daughter request antibiotic be called in and they will schedule a follow up with Dr. Silvio Pate.  Answer Assessment - Initial Assessment Questions 1. SYMPTOM: "What's the main symptom you're concerned about?" (e.g., frequency, incontinence)     Pain and burning 2. ONSET: "When did the  ________  start?"     Started this morning 3. PAIN: "Is there any pain?" If so, ask: "How bad is it?" (Scale: 1-10; mild, moderate, severe)     Pain 4. CAUSE: "What do you think is causing the symptoms?"     UTI 5. OTHER SYMPTOMS: "Do you have any other symptoms?" (e.g., fever, flank pain, blood in urine, pain with urination)     No 6. PREGNANCY: "Is there any chance you are pregnant?" "When was your last menstrual period?"     No  Protocols used: URINARY [facility]

## 2017-07-25 NOTE — Telephone Encounter (Signed)
She would need to be seen

## 2017-07-25 NOTE — Telephone Encounter (Signed)
I will see her then  

## 2017-07-25 NOTE — Progress Notes (Signed)
Subjective:    Patient ID: Amanda [name], female    DOB: [birth year], 84 y.o.   MRN: [id number]  HPI Here for urinary symptoms   Hx of recurrent uti  She is starting to make the effort to drink more water  Family is caring for her and enc her to drink water   Burns to urinate (a little better now) -- started this am  Uncomfortable to sit down Some bladder discomfort  Frequency and urgency  No odor  No frank blood in urine  No fever  Some nausea (very mild) without vomiting   Results for orders placed or performed in visit on 07/25/17  POCT Urinalysis Dipstick (Automated)  Result Value Ref Range   Color, UA Yellow    Clarity, UA Hazy    Glucose, UA Negative    Bilirubin, UA Negative    Ketones, UA Negative    Spec Grav, UA 1.020 1.010 - 1.025   Blood, UA 25 Ery/uL    pH, UA 6.0 5.0 - 8.0   Protein, UA Negative    Urobilinogen, UA 0.2 0.2 or 1.0 E.U./dL   Nitrite, UA Negative    Leukocytes, UA Large (3+) (A) Negative      Had a visit with Dr Silvio Pate on 3/11 Presented with dysuria In and out cath only showed 40 cc (not retaining) tx with cefuroxime (250 mg bid )  Her symptoms did resolve   Has bladder prolapse/ per pt - and urinary incontinence   Had e coli Resistant to quinolones and trimeth/sulfa and gent    Lab Results  Component Value Date   CREATININE 2.04 (H) 06/21/2017   BUN 39 (H) 06/21/2017   NA 138 06/21/2017   K 4.4 06/21/2017   CL 99 06/21/2017   CO2 25 06/21/2017   Renal insuff   Patient Active Problem List   Diagnosis Date Noted  . UTI (urinary tract infection) 06/27/2017  . Long term (current) use of anticoagulants 04/07/2017  . Inguinal hernia 12/21/2016  . Neuropathy 10/08/2016  . History of recent fall 09/14/2016  . Low back pain 09/14/2016  . Pedal edema 09/14/2016  . Left rib fracture 06/11/2016  . Paroxysmal atrial fibrillation ([place]) 04/20/2016  . Anemia of chronic renal failure, stage 4 (severe) ([place]) 02/05/2015  . Recurrent  cystitis 02/25/2014  . Routine general medical examination at a health care facility 07/16/2013  . Thoracic aorta atherosclerosis ([place]) 07/16/2013  . Encounter for therapeutic drug monitoring 06/11/2013  . Chronic diastolic CHF (congestive heart failure) ([place]) 05/29/2013  . Chronic renal disease, stage IV ([place]) 12/16/2011  . GERD (gastroesophageal reflux disease)   . SICK SINUS/ TACHY-BRADY SYNDROME 12/15/2009  . PPM-Medtronic 12/15/2009  . Mitral valve disorder 12/08/2009  . ACTINIC KERATOSIS 07/23/2009  . Episodic mood disorder ([place]) 05/05/2009  . HEARING LOSS 04/25/2009  . Hypothyroidism 12/27/2008  . Hyperlipidemia 12/27/2008  . Essential hypertension 12/27/2008  . ATRIAL FIBRILLATION 12/27/2008  . OSTEOARTHRITIS 12/27/2008  . SLEEP DISORDER 12/27/2008  . URINARY INCONTINENCE 12/27/2008   Past Medical History:  Diagnosis Date  . Atrial fibrillation ([place])   . Depression   . GERD (gastroesophageal reflux disease)   . Hyperlipidemia   . Hypertension   . Hypothyroidism    after Rx for thyroid cancer  . Mitral regurgitation   . Osteoarthritis   . Pacemaker   . Sleep disturbance   . Urinary incontinence    usually from UTI's   Past Surgical History:  Procedure Laterality Date  . BREAST  BIOPSY  2002  . BREAST LUMPECTOMY  1983  . CATARACT EXTRACTION, BILATERAL    . EP IMPLANTABLE DEVICE N/A 09/02/2015   Procedure: PPM Generator Changeout;  Surgeon: Evans Lance, MD;  Location: [facility];  Service: Cardiovascular;  Laterality: N/A;  . Goiter Removed  1976   cancerous. Then got RAI  . PACEMAKER INSERTION  5/07   after syncope  . TONSILLECTOMY AND ADENOIDECTOMY  1937  . VESICOVAGINAL FISTULA CLOSURE W/ TAH  1972   Social History   Tobacco Use  . Smoking status: Never Smoker  . Smokeless tobacco: Never Used  Substance Use Topics  . Alcohol use: No  . Drug use: No   Family History  Problem Relation Age of Onset  . Diabetes Mother   . Stroke Sister   .  Coronary artery disease Brother   . Prostate cancer Brother   . Breast cancer Neg Hx   . Colon cancer Neg Hx    Allergies  Allergen Reactions  . Amoxicillin Diarrhea    Weakness and atrial fib  . Augmentin [Amoxicillin-Pot Clavulanate] Diarrhea    Weakness and atrial fib  . Sulfonamide Derivatives     unknown   Current Outpatient Medications on File Prior to Visit  Medication Sig Dispense Refill  . acetaminophen (TYLENOL) 500 MG tablet Take 500 mg by mouth every 8 (eight) hours as needed for mild pain.     . cholecalciferol (VITAMIN D) 1000 UNITS tablet Take 1,000 Units by mouth daily.    Marland Kitchen diltiazem (CARDIZEM CD) 120 MG 24 hr capsule TAKE 1 CAPSULE BY MOUTH  DAILY 90 capsule 3  . hydrochlorothiazide (HYDRODIURIL) 25 MG tablet Take 1 tablet (25 mg total) by mouth as needed. 90 tablet 3  . levothyroxine (SYNTHROID, LEVOTHROID) 75 MCG tablet TAKE 1 TABLET BY MOUTH  DAILY 90 tablet 3  . mirtazapine (REMERON) 15 MG tablet TAKE 1 TABLET BY MOUTH AT  BEDTIME 90 tablet 3  . nitroGLYCERIN (NITROSTAT) 0.4 MG SL tablet Place 0.4 mg under the tongue every 5 (five) minutes as needed for chest pain (x 3 doses).     Marland Kitchen omeprazole (PRILOSEC) 20 MG capsule Take 1 capsule (20 mg total) by mouth daily. 90 capsule 3  . sotalol (BETAPACE) 120 MG tablet TAKE ONE-HALF TABLET BY  MOUTH TWO TIMES DAILY 90 tablet 3  . traMADol (ULTRAM) 50 MG tablet TAKE 1/2 TO 1 TABLET BY MOUTH 3 TIMES A DAY AS NEEDED FOR PAIN 90 tablet 0  . trimethoprim (TRIMPEX) 100 MG tablet TAKE 1 TABLET BY MOUTH  DAILY 90 tablet 1  . warfarin (COUMADIN) 1 MG tablet TAKE 1 TABLET BY MOUTH  DAILY AS DIRECTED BY  ANTICOAGULATION CLINIC 90 tablet 1  . warfarin (COUMADIN) 4 MG tablet Take daily as directed by coumadin clinic. 90 tablet 1   No current facility-administered medications on file prior to visit.      Review of Systems  Constitutional: Positive for fatigue. Negative for activity change, appetite change and fever.  HENT: Negative  for congestion and sore throat.   Eyes: Negative for itching and visual disturbance.  Respiratory: Negative for cough and shortness of breath.   Cardiovascular: Negative for leg swelling.  Gastrointestinal: Negative for abdominal distention, abdominal pain, constipation, diarrhea and nausea.  Endocrine: Negative for cold intolerance and polydipsia.  Genitourinary: Positive for dysuria, frequency and urgency. Negative for decreased urine volume, difficulty urinating, flank pain, hematuria, pelvic pain, vaginal bleeding and vaginal discharge.  Musculoskeletal: Negative  for myalgias.  Skin: Negative for rash.  Allergic/Immunologic: Negative for immunocompromised state.  Neurological: Negative for dizziness and weakness.  Hematological: Negative for adenopathy.       Objective:   Physical Exam  Constitutional: She appears well-developed and well-nourished. No distress.  Well appearing elderly female in no distress  HENT:  Head: Normocephalic and atraumatic.  Hard of hearing  Eyes: Pupils are equal, round, and reactive to light. Conjunctivae and EOM are normal.  Neck: Normal range of motion. Neck supple.  Cardiovascular: Normal rate, regular rhythm and normal heart sounds.  Pulmonary/Chest: Effort normal and breath sounds normal.  Abdominal: Soft. Bowel sounds are normal. She exhibits no distension. There is tenderness. There is no rebound.  No cva tenderness  no suprapubic tenderness or fullness  Musculoskeletal: She exhibits no edema.  Lymphadenopathy:    She has no cervical adenopathy.  Neurological: She is alert.  Skin: No rash noted. No pallor.  Psychiatric: She has a normal mood and affect.  Pleasant Supportive daughter present           Assessment & Plan:   Problem List Items Addressed This Visit      Genitourinary   UTI (urinary tract infection) - Primary    Pos ua today-reviewed last culture and note from PCP Recurrent -though did have resolution of symptoms  with cefuroxime with last uti until today cx sent Px cefuroxime again (taking note last cx sens and also her renal insufficiency) Disc imp of lfuid intake with pt and her daughter as well   (as long as no fluid restriction form cardiology) Suspect freq uti are multifactorial  Update if not starting to improve in several days or if worsening        Relevant Medications   cefUROXime (CEFTIN) 250 MG tablet   Other Relevant Orders   Urine Culture    Other Visit Diagnoses    Dysuria       Relevant Orders   POCT Urinalysis Dipstick (Automated) (Completed)

## 2017-07-25 NOTE — Telephone Encounter (Signed)
Can you try to find her a spot with anyone?

## 2017-07-25 NOTE — Telephone Encounter (Signed)
Patient is being seen today at 9 by Dr. Glori Bickers.

## 2017-07-25 NOTE — Patient Instructions (Signed)
Drink your water - 8 glasses or more per day  That may prevent utis and help your kidney health   Take the cefuroxime as directed   We will contact you with culture result when it returns   If symptoms worsen in the meantime please call us

## 2017-07-25 NOTE — Assessment & Plan Note (Signed)
Pos ua today-reviewed last culture and note from PCP Recurrent -though did have resolution of symptoms with cefuroxime with last uti until today cx sent Px cefuroxime again (taking note last cx sens and also her renal insufficiency) Disc imp of lfuid intake with pt and her daughter as well   (as long as no fluid restriction form cardiology) Suspect freq uti are multifactorial  Update if not starting to improve in several days or if worsening

## 2017-07-26 LAB — URINE CULTURE
MICRO NUMBER: 90429861
RESULT: NO GROWTH
SPECIMEN QUALITY:: ADEQUATE

## 2017-08-08 ENCOUNTER — Other Ambulatory Visit: Payer: Self-pay | Admitting: Internal Medicine

## 2017-08-08 NOTE — Telephone Encounter (Signed)
Approved:confirm dosing of warfarin with M[facility] Okay trimethoprim for a year

## 2017-08-16 ENCOUNTER — Ambulatory Visit (INDEPENDENT_AMBULATORY_CARE_PROVIDER_SITE_OTHER): Payer: Medicare Other | Admitting: Internal Medicine

## 2017-08-16 ENCOUNTER — Ambulatory Visit (INDEPENDENT_AMBULATORY_CARE_PROVIDER_SITE_OTHER): Payer: Medicare Other | Admitting: *Deleted

## 2017-08-16 ENCOUNTER — Encounter: Payer: Self-pay | Admitting: Internal Medicine

## 2017-08-16 ENCOUNTER — Ambulatory Visit (INDEPENDENT_AMBULATORY_CARE_PROVIDER_SITE_OTHER): Payer: Medicare Other

## 2017-08-16 VITALS — BP 116/60 | HR 76 | Temp 97.7°F | Ht 62.0 in | Wt 118.0 lb

## 2017-08-16 DIAGNOSIS — Z23 Encounter for immunization: Secondary | ICD-10-CM

## 2017-08-16 DIAGNOSIS — F39 Unspecified mood [affective] disorder: Secondary | ICD-10-CM

## 2017-08-16 DIAGNOSIS — M545 Low back pain, unspecified: Secondary | ICD-10-CM

## 2017-08-16 DIAGNOSIS — I5032 Chronic diastolic (congestive) heart failure: Secondary | ICD-10-CM

## 2017-08-16 DIAGNOSIS — N184 Chronic kidney disease, stage 4 (severe): Secondary | ICD-10-CM

## 2017-08-16 DIAGNOSIS — I480 Paroxysmal atrial fibrillation: Secondary | ICD-10-CM

## 2017-08-16 DIAGNOSIS — I495 Sick sinus syndrome: Secondary | ICD-10-CM

## 2017-08-16 DIAGNOSIS — Z0000 Encounter for general adult medical examination without abnormal findings: Secondary | ICD-10-CM | POA: Diagnosis not present

## 2017-08-16 DIAGNOSIS — D631 Anemia in chronic kidney disease: Secondary | ICD-10-CM

## 2017-08-16 DIAGNOSIS — Z7901 Long term (current) use of anticoagulants: Secondary | ICD-10-CM | POA: Diagnosis not present

## 2017-08-16 DIAGNOSIS — Z7189 Other specified counseling: Secondary | ICD-10-CM

## 2017-08-16 LAB — POCT INR: INR: 2.1

## 2017-08-16 NOTE — Progress Notes (Signed)
Hearing Screening Comments: Wears hearing aids Vision Screening Comments: June 2018

## 2017-08-16 NOTE — Assessment & Plan Note (Signed)
Mild dysthymia and sleep problems Continue the mirtazapine

## 2017-08-16 NOTE — Assessment & Plan Note (Signed)
See social history °Has DNR °

## 2017-08-16 NOTE — Progress Notes (Signed)
Subjective:    Patient ID: Amanda [name], female    DOB: Jul 02, 1919, 84 y.o.   MRN: [id number]  HPI Here with daughter for Medicare wellness visit and follow up of chronic health conditions Reviewed form and advanced directives Reviewed other doctors No alcohol or tobacco No exercise Doesn't drive but still does instrumental ADLs. Daughter helps with bills Did have a fall with injury Vision okay Hearing aides---trouble on the left right now Memory seems okay--mild forgetfulness  Generally feels okay Recent bladder infection Continues on the trimethoprim Discussed adding estrogen cream if recurrence soon  No palpitations Gets SOB--- with activity (like personal care) No chest pain No syncope---but will get dizzy if she stands up too quick Chronic edema--more on left. Does take the HCTZ Discussed change to NOAC--renal function is an issue  Sleeps fairly well Needs the tramadol every night On mirtazapine---no worsening in "natural" down feelings  No problems with thyroid that she knows of  Current Outpatient Medications on File Prior to Visit  Medication Sig Dispense Refill  . acetaminophen (TYLENOL) 500 MG tablet Take 500 mg by mouth every 8 (eight) hours as needed for mild pain.     . cholecalciferol (VITAMIN D) 1000 UNITS tablet Take 1,000 Units by mouth daily.    Marland Kitchen diltiazem (CARDIZEM CD) 120 MG 24 hr capsule TAKE 1 CAPSULE BY MOUTH  DAILY 90 capsule 3  . hydrochlorothiazide (HYDRODIURIL) 25 MG tablet Take 1 tablet (25 mg total) by mouth as needed. 90 tablet 3  . levothyroxine (SYNTHROID, LEVOTHROID) 75 MCG tablet TAKE 1 TABLET BY MOUTH  DAILY 90 tablet 3  . mirtazapine (REMERON) 15 MG tablet TAKE 1 TABLET BY MOUTH AT  BEDTIME 90 tablet 3  . nitroGLYCERIN (NITROSTAT) 0.4 MG SL tablet Place 0.4 mg under the tongue every 5 (five) minutes as needed for chest pain (x 3 doses).     . sotalol (BETAPACE) 120 MG tablet TAKE ONE-HALF TABLET BY  MOUTH TWO TIMES DAILY 90 tablet 3    . traMADol (ULTRAM) 50 MG tablet TAKE 1/2 TO 1 TABLET BY MOUTH 3 TIMES A DAY AS NEEDED FOR PAIN 90 tablet 0  . trimethoprim (TRIMPEX) 100 MG tablet TAKE 1 TABLET BY MOUTH  DAILY 90 tablet 3  . warfarin (COUMADIN) 1 MG tablet TAKE 1 TABLET BY MOUTH  DAILY AS DIRECTED BY  ANTICOAGULATION CLINIC 90 tablet 1  . warfarin (COUMADIN) 4 MG tablet TAKE DAILY AS DIRECTED BY  COUMADIN CLINIC. 90 tablet 1   No current facility-administered medications on file prior to visit.     Allergies  Allergen Reactions  . Amoxicillin Diarrhea    Weakness and atrial fib  . Augmentin [Amoxicillin-Pot Clavulanate] Diarrhea    Weakness and atrial fib  . Sulfonamide Derivatives     unknown    Past Medical History:  Diagnosis Date  . Atrial fibrillation ([place])   . Depression   . GERD (gastroesophageal reflux disease)   . Hyperlipidemia   . Hypertension   . Hypothyroidism    after Rx for thyroid cancer  . Mitral regurgitation   . Osteoarthritis   . Pacemaker   . Sleep disturbance   . Urinary incontinence    usually from UTI's    Past Surgical History:  Procedure Laterality Date  . BREAST BIOPSY  2002  . BREAST LUMPECTOMY  1983  . CATARACT EXTRACTION, BILATERAL    . EP IMPLANTABLE DEVICE N/A 09/02/2015   Procedure: PPM Generator Changeout;  Surgeon: Evans Lance, MD;  Location: [facility];  Service: Cardiovascular;  Laterality: N/A;  . Goiter Removed  1976   cancerous. Then got RAI  . PACEMAKER INSERTION  5/07   after syncope  . TONSILLECTOMY AND ADENOIDECTOMY  1937  . VESICOVAGINAL FISTULA CLOSURE W/ TAH  1972    Family History  Problem Relation Age of Onset  . Diabetes Mother   . Stroke Sister   . Coronary artery disease Brother   . Prostate cancer Brother   . Breast cancer Neg Hx   . Colon cancer Neg Hx     Social History   Socioeconomic History  . Marital status: Widowed    Spouse name: Not on file  . Number of children: 2  . Years of education: Not on file  .  Highest education level: Not on file  Occupational History  . Occupation: homemaker  Social Needs  . Financial resource strain: Not on file  . Food insecurity:    Worry: Not on file    Inability: Not on file  . Transportation needs:    Medical: Not on file    Non-medical: Not on file  Tobacco Use  . Smoking status: Never Smoker  . Smokeless tobacco: Never Used  Substance and Sexual Activity  . Alcohol use: No  . Drug use: No  . Sexual activity: Not on file  Lifestyle  . Physical activity:    Days per week: Not on file    Minutes per session: Not on file  . Stress: Not on file  Relationships  . Social connections:    Talks on phone: Not on file    Gets together: Not on file    Attends religious service: Not on file    Active member of club or organization: Not on file    Attends meetings of clubs or organizations: Not on file    Relationship status: Not on file  . Intimate partner violence:    Fear of current or ex partner: Not on file    Emotionally abused: Not on file    Physically abused: Not on file    Forced sexual activity: Not on file  Other Topics Concern  . Not on file  Social History Narrative   Widowed 2002. 1 local daughter. Other in [place]. Was homemaker. Farmed and gardened.    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    Daughter [name]) is health care POA.    Discussed DNR and she requests    Would not want tube feeds if cognitively unaware    Review of Systems Appetite is fair Weight stable Wears seat belt Bowels are slow---relates to the pain med. Daughter got miralax--please try. No blood in stool No heartburn or dysphagia No skin rash or suspicious lesions---needs derm visit Dentures---no dentist    Objective:   Physical Exam  Constitutional: She is oriented to person, place, and time. She appears well-developed. No distress.  HENT:  Mouth/Throat: Oropharynx is clear and moist. No oropharyngeal exudate.  Neck: No thyromegaly present.  Cardiovascular:  Normal rate, regular rhythm and normal heart sounds. Exam reveals no friction rub.  No murmur heard. Pulses not palpable  Pulmonary/Chest: Effort normal and breath sounds normal. No stridor. No respiratory distress. She has no wheezes. She has no rales.  Abdominal: Soft. She exhibits no distension and no mass. There is no tenderness. There is no guarding.  Musculoskeletal: She exhibits no edema or tenderness.  Lymphadenopathy:    She has no cervical adenopathy.  Neurological: She is alert and  oriented to person, place, and time.  President--- "Daisy Floro, ?" 100-93-? [id number] Recall 3/3  Skin: Skin is warm. No erythema.  Psychiatric: She has a normal mood and affect. Her behavior is normal.          Assessment & Plan:

## 2017-08-16 NOTE — Patient Instructions (Signed)
INR today 2.1  Continue taking 3mg  daily EXCEPT for 2 mg on Mondays.  Recheck in 6 weeks.    Patient is doing very well without any complaints or concerns.  No recent changes in diet, health or medications.   Patient and daughter Amanda [name] present and verbalize understanding of instructions given today.

## 2017-08-16 NOTE — Addendum Note (Signed)
Addended by: Pilar Grammes on: 08/16/2017 11:58 AM   Modules accepted: Orders

## 2017-08-16 NOTE — Assessment & Plan Note (Signed)
Chronic stable DOE No acute CHF

## 2017-08-16 NOTE — Assessment & Plan Note (Signed)
No obvious flares on her meds Discussed NOAC---with GFR near 15, will continue warfarin

## 2017-08-16 NOTE — Assessment & Plan Note (Signed)
I have personally reviewed the Medicare Annual Wellness questionnaire and have noted 1. The patient's medical and social history 2. Their use of alcohol, tobacco or illicit drugs 3. Their current medications and supplements 4. The patient's functional ability including ADL's, fall risks, home safety risks and hearing or visual             impairment. 5. Diet and physical activities 6. Evidence for depression or mood disorders  The patients weight, height, BMI and visual acuity have been recorded in the chart I have made referrals, counseling and provided education to the patient based review of the above and I have provided the pt with a written personalized care plan for preventive services.  I have provided you with a copy of your personalized plan for preventive services. Please take the time to review along with your updated medication list.  Will update pneumovax Yearly flu vaccine No cancer screening due to age

## 2017-08-16 NOTE — Assessment & Plan Note (Addendum)
Discussed dialysis--she is not interested Does take vitamin D

## 2017-08-16 NOTE — Assessment & Plan Note (Signed)
Continues on the tramadol

## 2017-08-16 NOTE — Assessment & Plan Note (Signed)
Mild anemia is stable

## 2017-08-17 ENCOUNTER — Encounter: Payer: Self-pay | Admitting: Cardiology

## 2017-08-17 LAB — CUP PACEART REMOTE DEVICE CHECK
Battery Impedance: 128 Ohm
Brady Statistic AP VP Percent: 5 %
Brady Statistic AP VS Percent: 94 %
Brady Statistic AS VP Percent: 0 %
Brady Statistic AS VS Percent: 1 %
Implantable Lead Implant Date: 20070524
Implantable Lead Implant Date: 20070524
Implantable Lead Location: 753860
Implantable Lead Model: 5076
Implantable Lead Model: 5568
Lead Channel Impedance Value: 481 Ohm
Lead Channel Impedance Value: 771 Ohm
Lead Channel Pacing Threshold Amplitude: 0.75 V
Lead Channel Pacing Threshold Amplitude: 1.125 V
Lead Channel Pacing Threshold Pulse Width: 0.4 ms
Lead Channel Setting Pacing Amplitude: 2.5 V
MDC IDC LEAD LOCATION: 753859
MDC IDC MSMT BATTERY REMAINING LONGEVITY: 128 mo
MDC IDC MSMT BATTERY VOLTAGE: 2.79 V
MDC IDC MSMT LEADCHNL RV PACING THRESHOLD PULSEWIDTH: 0.4 ms
MDC IDC PG IMPLANT DT: 20170516
MDC IDC SESS DTM: 20190430161434
MDC IDC SET LEADCHNL RA PACING AMPLITUDE: 2.25 V
MDC IDC SET LEADCHNL RV PACING PULSEWIDTH: 0.4 ms
MDC IDC SET LEADCHNL RV SENSING SENSITIVITY: 2 mV

## 2017-08-17 NOTE — Progress Notes (Signed)
Remote pacemaker transmission.   

## 2017-08-18 DIAGNOSIS — H6123 Impacted cerumen, bilateral: Secondary | ICD-10-CM | POA: Diagnosis not present

## 2017-08-18 DIAGNOSIS — H903 Sensorineural hearing loss, bilateral: Secondary | ICD-10-CM | POA: Diagnosis not present

## 2017-09-27 ENCOUNTER — Ambulatory Visit (INDEPENDENT_AMBULATORY_CARE_PROVIDER_SITE_OTHER): Payer: Medicare Other

## 2017-09-27 DIAGNOSIS — Z7901 Long term (current) use of anticoagulants: Secondary | ICD-10-CM | POA: Diagnosis not present

## 2017-09-27 DIAGNOSIS — I480 Paroxysmal atrial fibrillation: Secondary | ICD-10-CM

## 2017-09-27 LAB — POCT INR: INR: 2.4 (ref 2.0–3.0)

## 2017-09-27 NOTE — Patient Instructions (Signed)
INR today 2.4  Continue taking 3mg  daily EXCEPT for 2 mg on Mondays.  Recheck in 6 weeks.    Patient is doing very well without any complaints or concerns.  No recent changes in diet, health or medications.   Patient and daughter Amanda [name] present and verbalize understanding of instructions given today.

## 2017-09-28 ENCOUNTER — Other Ambulatory Visit: Payer: Self-pay | Admitting: Internal Medicine

## 2017-09-28 NOTE — Telephone Encounter (Signed)
Last filled 06-27-17 #90 Last OV 08-16-17 Next OV 02-14-18

## 2017-09-30 ENCOUNTER — Ambulatory Visit: Payer: Self-pay | Admitting: Internal Medicine

## 2017-09-30 ENCOUNTER — Encounter: Payer: Self-pay | Admitting: Family Medicine

## 2017-09-30 ENCOUNTER — Ambulatory Visit (INDEPENDENT_AMBULATORY_CARE_PROVIDER_SITE_OTHER): Payer: Medicare Other | Admitting: Family Medicine

## 2017-09-30 VITALS — BP 120/62 | HR 90 | Temp 97.9°F | Wt 117.8 lb

## 2017-09-30 DIAGNOSIS — N3090 Cystitis, unspecified without hematuria: Secondary | ICD-10-CM | POA: Diagnosis not present

## 2017-09-30 DIAGNOSIS — R300 Dysuria: Secondary | ICD-10-CM | POA: Diagnosis not present

## 2017-09-30 LAB — POC URINALSYSI DIPSTICK (AUTOMATED)
Bilirubin, UA: NEGATIVE
Blood, UA: NEGATIVE
Glucose, UA: NEGATIVE
Ketones, UA: NEGATIVE
NITRITE UA: NEGATIVE
PH UA: 7 (ref 5.0–8.0)
PROTEIN UA: NEGATIVE
Spec Grav, UA: 1.01 (ref 1.010–1.025)
UROBILINOGEN UA: 0.2 U/dL

## 2017-09-30 MED ORDER — CEFUROXIME AXETIL 250 MG PO TABS: 250.0000 mg | ORAL_TABLET | Freq: Two times a day (BID) | ORAL | 0 refills | Status: DC

## 2017-09-30 NOTE — Telephone Encounter (Signed)
I returned a call to pt's daughter Aliene Beams.   She let me know her mother completed a course of antibiotics in March for a UTI.   "She gets these a lot".    "She gets upset and anxious a lot when she knows I'm going out of town".   "I'm not sure how much of this is UTI or her being upset that I'm going out of town."   While talking to her about her mother's symptoms she asked if I was working there in the office or the "other place".   I let her know I was with the [facility] and that we work closely with the office.   She was requesting her mother be worked in for a visit today however when I let her know there were not any availability today she requested that "Larene Beach from the office give her a call". I let her know I would route a note to the office asking Larene Beach to call the daughter.  I called the [facility] office to let them know the above information however Dr. Silvio Pate or Larene Beach are in today.   Flow coordinator asked that I send the triage notes to the office and she will have someone look at it.  I routed the note to the office.      Reason for Disposition . Age > 50 years  Answer Assessment - Initial Assessment Questions 1. SEVERITY: "How bad is the pain?"  (e.g., Scale 1-10; mild, moderate, or severe)   - MILD (1-3): complains slightly about urination hurting   - MODERATE (4-7): interferes with normal activities     - SEVERE (8-10): excruciating, unwilling or unable to urinate because of the pain      She completed antibiotics in March for an UTI.    She gets upset and anxious when she knows I'm going out of town.  Daughter Aliene Beams calling in.    I can bring her in on Monday.   If I get her an antibiotic she won't take it.   2. FREQUENCY: "How many times have you had painful urination today?"      *No Answer* 3. PATTERN: "Is pain present every time you urinate or just sometimes?"      *No Answer* 4. ONSET: "When did the painful urination start?"      *No  Answer* 5. FEVER: "Do you have a fever?" If so, ask: "What is your temperature, how was it measured, and when did it start?"     *No Answer* 6. PAST UTI: "Have you had a urine infection before?" If so, ask: "When was the last time?" and "What happened that time?"      *No Answer* 7. CAUSE: "What do you think is causing the painful urination?"  (e.g., UTI, scratch, Herpes sore)     *No Answer* 8. OTHER SYMPTOMS: "Do you have any other symptoms?" (e.g., flank pain, vaginal discharge, genital sores, urgency, blood in urine)     *No Answer* 9. PREGNANCY: "Is there any chance you are pregnant?" "When was your last menstrual period?"     *No Answer*  Protocols used: [place]

## 2017-09-30 NOTE — Telephone Encounter (Signed)
I spoke with [name]; pt having slight burning upon urination. I explained to [facility] that Dr Silvio Pate and Larene Beach CMA is out of office today and [name] Chroman FNP could see pt for UTI symptoms only. [name] voiced understanding and was appreciative and [name] does not want pt to know she mentioned anything about pt being anxious because that will upset pt more. [name] scheduled appt today at 4:15 with D Carlean Purl FNP and will try to have pt wait to urinate when she comes to office for clean catch urine. FYI to [name] Chroman FNP.

## 2017-09-30 NOTE — Progress Notes (Signed)
Subjective:    Patient ID: Amanda [name], female    DOB: 07/10/1919, 84 y.o.   MRN: [id number]  HPI This is a 84 yo female, accompanied by her daughter, who presents today with burning with urination and chills that started today. She denies chest pain, SOB, abdominal pain, nausea, vomiting, fever or back pain. She has occasional UTI with last one 06/27/17, culture with ecoli. Treated with cefuroxime with resolution.    Past Medical History:  Diagnosis Date  . Atrial fibrillation ([place])   . Depression   . GERD (gastroesophageal reflux disease)   . Hyperlipidemia   . Hypertension   . Hypothyroidism    after Rx for thyroid cancer  . Mitral regurgitation   . Osteoarthritis   . Pacemaker   . Sleep disturbance   . Urinary incontinence    usually from UTI's   Past Surgical History:  Procedure Laterality Date  . BREAST BIOPSY  2002  . BREAST LUMPECTOMY  1983  . CATARACT EXTRACTION, BILATERAL    . EP IMPLANTABLE DEVICE N/A 09/02/2015   Procedure: PPM Generator Changeout;  Surgeon: Evans Lance, MD;  Location: [facility];  Service: Cardiovascular;  Laterality: N/A;  . Goiter Removed  1976   cancerous. Then got RAI  . PACEMAKER INSERTION  5/07   after syncope  . TONSILLECTOMY AND ADENOIDECTOMY  1937  . VESICOVAGINAL FISTULA CLOSURE W/ TAH  1972   Family History  Problem Relation Age of Onset  . Diabetes Mother   . Stroke Sister   . Coronary artery disease Brother   . Prostate cancer Brother   . Breast cancer Neg Hx   . Colon cancer Neg Hx    Social History   Tobacco Use  . Smoking status: Never Smoker  . Smokeless tobacco: Never Used  Substance Use Topics  . Alcohol use: No  . Drug use: No      Review of Systems Per HPI    Objective:   Physical Exam  Physical Exam  Constitutional: She is oriented to person, place, and time. She appears frail, using a walker to ambulate. No distress.  HENT:  Head: Normocephalic and atraumatic.  Cardiovascular: Normal  rate, regular rhythm and normal heart sounds.   Pulmonary/Chest: Effort normal and breath sounds normal.  Abdominal: Soft. She exhibits no distension. There is no tenderness. There is no rebound, no guarding and no CVA tenderness.  Neurological: She is alert and oriented to person, place, and time.  Skin: Skin is warm and dry. She is not diaphoretic.  Psychiatric: She has a normal mood and affect. Her behavior is normal. Judgment and thought content normal.  Vitals reviewed.    BP 120/62   Pulse 90   Temp 97.9 F (36.6 C)   Wt 117 lb 12.8 oz (53.4 kg)   SpO2 95%   BMI 21.55 kg/m  Wt Readings from Last 3 Encounters:  09/30/17 117 lb 12.8 oz (53.4 kg)  08/16/17 118 lb (53.5 kg)  07/25/17 117 lb 4 oz (53.2 kg)   Results for orders placed or performed in visit on 09/30/17  POCT Urinalysis Dipstick (Automated)  Result Value Ref Range   Color, UA     Clarity, UA     Glucose, UA Negative Negative   Bilirubin, UA Negative    Ketones, UA Negative    Spec Grav, UA 1.010 1.010 - 1.025   Blood, UA Negative    pH, UA 7.0 5.0 - 8.0   Protein, UA  Negative Negative   Urobilinogen, UA 0.2 0.2 or 1.0 E.U./dL   Nitrite, UA Negative    Leukocytes, UA Small (1+) (A) Negative       Assessment & Plan:  1. Cystitis - Provided written and verbal information regarding diagnosis and treatment. - RTC/ER precautions reviewed - cefUROXime (CEFTIN) 250 MG tablet; Take 1 tablet (250 mg total) by mouth 2 (two) times daily with a meal.  Dispense: 14 tablet; Refill: 0 - Urine Culture - encouraged good fluid intake  2. Dysuria - POCT Urinalysis Dipstick (Automated)   Clarene Reamer, FNP-BC  [facility], [place]  09/30/2017 5:08 PM

## 2017-09-30 NOTE — Patient Instructions (Signed)
Good to see you today  We will notify you of your culture results next week   Urinary Tract Infection, Adult A urinary tract infection (UTI) is an infection of any part of the urinary tract. The urinary tract includes the:  Kidneys.  Ureters.  Bladder.  Urethra.  These organs make, store, and get rid of pee (urine) in the body. Follow these instructions at home:  Take over-the-counter and prescription medicines only as told by your doctor.  If you were prescribed an antibiotic medicine, take it as told by your doctor. Do not stop taking the antibiotic even if you start to feel better.  Avoid the following drinks: ? Alcohol. ? Caffeine. ? Tea. ? Carbonated drinks.  Drink enough fluid to keep your pee clear or pale yellow.  Keep all follow-up visits as told by your doctor. This is important.  Make sure to: ? Empty your bladder often and completely. Do not to hold pee for long periods of time. ? Empty your bladder before and after sex. ? Wipe from front to back after a bowel movement if you are female. Use each tissue one time when you wipe. Contact a doctor if:  You have back pain.  You have a fever.  You feel sick to your stomach (nauseous).  You throw up (vomit).  Your symptoms do not get better after 3 days.  Your symptoms go away and then come back. Get help right away if:  You have very bad back pain.  You have very bad lower belly (abdominal) pain.  You are throwing up and cannot keep down any medicines or water. This information is not intended to replace advice given to you by your health care provider. Make sure you discuss any questions you have with your health care provider. Document Released: 09/22/2007 Document Revised: 09/11/2015 Document Reviewed: 02/24/2015 Elsevier Interactive Patient Education  [employer].

## 2017-10-02 LAB — URINE CULTURE
MICRO NUMBER: 90716157
SPECIMEN QUALITY: ADEQUATE

## 2017-10-03 ENCOUNTER — Ambulatory Visit: Payer: Medicare Other | Admitting: Family Medicine

## 2017-10-05 ENCOUNTER — Telehealth: Payer: Self-pay | Admitting: Internal Medicine

## 2017-10-05 NOTE — Telephone Encounter (Signed)
Copied from [place] [id number]. Topic: Quick Communication - See Telephone Encounter >> Oct 05, 2017  3:33 PM Bea Graff, NT wrote: CRM for notification. See Telephone encounter for: 10/05/17. Pts daughter calling back to get moms lab results.

## 2017-10-06 NOTE — Telephone Encounter (Signed)
Left message on voicemail for pt to return call to office for results.    

## 2017-11-08 ENCOUNTER — Ambulatory Visit (INDEPENDENT_AMBULATORY_CARE_PROVIDER_SITE_OTHER): Payer: Medicare Other

## 2017-11-08 DIAGNOSIS — Z7901 Long term (current) use of anticoagulants: Secondary | ICD-10-CM

## 2017-11-08 DIAGNOSIS — I480 Paroxysmal atrial fibrillation: Secondary | ICD-10-CM

## 2017-11-08 LAB — POCT INR: INR: 2.5 (ref 2.0–3.0)

## 2017-11-08 NOTE — Patient Instructions (Signed)
INR today 2.5  Continue taking 3mg  daily EXCEPT for 2 mg on Mondays.  Recheck in 6 weeks.    Patient is doing very well without any complaints or concerns.  No recent changes in diet, health or medications.   Patient and daughter Amanda [name] present and verbalize understanding of instructions given today.

## 2017-11-15 ENCOUNTER — Encounter: Payer: Medicare Other | Admitting: *Deleted

## 2017-11-15 ENCOUNTER — Telehealth: Payer: Self-pay

## 2017-11-15 NOTE — Telephone Encounter (Signed)
Confirmed remote transmission w/ pt daughter.   

## 2017-11-16 ENCOUNTER — Encounter: Payer: Self-pay | Admitting: Cardiology

## 2017-11-24 ENCOUNTER — Ambulatory Visit (INDEPENDENT_AMBULATORY_CARE_PROVIDER_SITE_OTHER): Payer: Medicare Other | Admitting: *Deleted

## 2017-11-24 DIAGNOSIS — I495 Sick sinus syndrome: Secondary | ICD-10-CM

## 2017-11-25 NOTE — Progress Notes (Signed)
Remote pacemaker transmission.   

## 2017-12-14 LAB — CUP PACEART REMOTE DEVICE CHECK
Battery Impedance: 153 Ohm
Battery Voltage: 2.79 V
Brady Statistic AP VP Percent: 5 %
Brady Statistic AP VS Percent: 94 %
Brady Statistic AS VS Percent: 1 %
Implantable Lead Implant Date: 20070524
Implantable Lead Implant Date: 20070524
Implantable Lead Location: 753860
Implantable Lead Model: 5076
Implantable Lead Model: 5568
Implantable Pulse Generator Implant Date: 20170516
Lead Channel Impedance Value: 472 Ohm
Lead Channel Impedance Value: 743 Ohm
Lead Channel Pacing Threshold Pulse Width: 0.4 ms
Lead Channel Pacing Threshold Pulse Width: 0.4 ms
Lead Channel Setting Pacing Amplitude: 2.5 V
Lead Channel Setting Sensing Sensitivity: 2 mV
MDC IDC LEAD LOCATION: 753859
MDC IDC MSMT BATTERY REMAINING LONGEVITY: 123 mo
MDC IDC MSMT LEADCHNL RA PACING THRESHOLD AMPLITUDE: 1 V
MDC IDC MSMT LEADCHNL RV PACING THRESHOLD AMPLITUDE: 0.625 V
MDC IDC SESS DTM: 20190808130040
MDC IDC SET LEADCHNL RA PACING AMPLITUDE: 2 V
MDC IDC SET LEADCHNL RV PACING PULSEWIDTH: 0.4 ms
MDC IDC STAT BRADY AS VP PERCENT: 0 %

## 2017-12-20 ENCOUNTER — Ambulatory Visit (INDEPENDENT_AMBULATORY_CARE_PROVIDER_SITE_OTHER): Payer: Medicare Other

## 2017-12-20 DIAGNOSIS — I480 Paroxysmal atrial fibrillation: Secondary | ICD-10-CM

## 2017-12-20 DIAGNOSIS — Z7901 Long term (current) use of anticoagulants: Secondary | ICD-10-CM

## 2017-12-20 LAB — POCT INR: INR: 2.3 (ref 2.0–3.0)

## 2017-12-20 NOTE — Patient Instructions (Addendum)
  INR today 2.5  Continue taking 3mg  daily EXCEPT for 2 mg on Mondays.  Recheck in 6 weeks.   *Note: due to scheduling conflicts and request from patient/daughter, patient will come in in 8 weeks on 10/29 while here to see Dr. Silvio Pate.  Patient has been consistent for several months with INR's so I feel safe in allowing her to go the time span.  Daughter knows to call me if any changes in condition or medications. Patient is doing very well without any complaints or concerns.  No recent changes in diet, health or medications.   Patient and daughter [name] present and verbalize understanding of instructions given today.

## 2017-12-21 ENCOUNTER — Other Ambulatory Visit: Payer: Self-pay | Admitting: Internal Medicine

## 2017-12-21 NOTE — Telephone Encounter (Signed)
Last filled 09-28-17 #90 Last OV 09-30-17 Next OV 02-14-18

## 2018-01-16 DIAGNOSIS — L720 Epidermal cyst: Secondary | ICD-10-CM | POA: Diagnosis not present

## 2018-01-16 DIAGNOSIS — L821 Other seborrheic keratosis: Secondary | ICD-10-CM | POA: Diagnosis not present

## 2018-01-16 DIAGNOSIS — L820 Inflamed seborrheic keratosis: Secondary | ICD-10-CM | POA: Diagnosis not present

## 2018-01-16 DIAGNOSIS — L853 Xerosis cutis: Secondary | ICD-10-CM | POA: Diagnosis not present

## 2018-01-16 DIAGNOSIS — L570 Actinic keratosis: Secondary | ICD-10-CM | POA: Diagnosis not present

## 2018-01-31 ENCOUNTER — Other Ambulatory Visit: Payer: Self-pay | Admitting: Internal Medicine

## 2018-01-31 NOTE — Telephone Encounter (Signed)
Patient is compliant with coumadin management, will refill warfarin X 9mths.  Refill request on Sotalol.  Next OV 62mth follow for 02/14/18.  Will refill X 1 year.  Per annual in April,  Patient continues on medication.

## 2018-02-10 ENCOUNTER — Other Ambulatory Visit: Payer: Self-pay | Admitting: Internal Medicine

## 2018-02-14 ENCOUNTER — Ambulatory Visit (INDEPENDENT_AMBULATORY_CARE_PROVIDER_SITE_OTHER): Payer: Medicare Other | Admitting: Internal Medicine

## 2018-02-14 ENCOUNTER — Encounter: Payer: Self-pay | Admitting: Internal Medicine

## 2018-02-14 ENCOUNTER — Ambulatory Visit (INDEPENDENT_AMBULATORY_CARE_PROVIDER_SITE_OTHER): Payer: Medicare Other

## 2018-02-14 VITALS — BP 110/70 | HR 66 | Temp 98.3°F | Ht 62.0 in | Wt 118.0 lb

## 2018-02-14 DIAGNOSIS — Z7901 Long term (current) use of anticoagulants: Secondary | ICD-10-CM | POA: Diagnosis not present

## 2018-02-14 DIAGNOSIS — I5032 Chronic diastolic (congestive) heart failure: Secondary | ICD-10-CM

## 2018-02-14 DIAGNOSIS — N2581 Secondary hyperparathyroidism of renal origin: Secondary | ICD-10-CM | POA: Diagnosis not present

## 2018-02-14 DIAGNOSIS — N184 Chronic kidney disease, stage 4 (severe): Secondary | ICD-10-CM

## 2018-02-14 DIAGNOSIS — Z23 Encounter for immunization: Secondary | ICD-10-CM

## 2018-02-14 DIAGNOSIS — I480 Paroxysmal atrial fibrillation: Secondary | ICD-10-CM

## 2018-02-14 DIAGNOSIS — I700 Atherosclerosis of aorta: Secondary | ICD-10-CM | POA: Diagnosis not present

## 2018-02-14 LAB — CBC
HCT: 31.9 % — ABNORMAL LOW (ref 36.0–46.0)
Hemoglobin: 10.7 g/dL — ABNORMAL LOW (ref 12.0–15.0)
MCHC: 33.7 g/dL (ref 30.0–36.0)
MCV: 87.3 fl (ref 78.0–100.0)
Platelets: 208 10*3/uL (ref 150.0–400.0)
RBC: 3.65 Mil/uL — AB (ref 3.87–5.11)
RDW: 15.1 % (ref 11.5–15.5)
WBC: 5.8 10*3/uL (ref 4.0–10.5)

## 2018-02-14 LAB — VITAMIN D 25 HYDROXY (VIT D DEFICIENCY, FRACTURES): VITD: 58.27 ng/mL (ref 30.00–100.00)

## 2018-02-14 LAB — RENAL FUNCTION PANEL
Albumin: 4.1 g/dL (ref 3.5–5.2)
BUN: 39 mg/dL — ABNORMAL HIGH (ref 6–23)
CO2: 32 mEq/L (ref 19–32)
Calcium: 10.2 mg/dL (ref 8.4–10.5)
Chloride: 100 mEq/L (ref 96–112)
Creatinine, Ser: 1.98 mg/dL — ABNORMAL HIGH (ref 0.40–1.20)
GFR: 24.84 mL/min — AB (ref >60.00)
GLUCOSE: 105 mg/dL — AB (ref 70–99)
POTASSIUM: 4.2 meq/L (ref 3.5–5.1)
Phosphorus: 3.6 mg/dL (ref 2.3–4.6)
Sodium: 138 mEq/L (ref 135–145)

## 2018-02-14 LAB — POCT INR: INR: 2.3 (ref 2.0–3.0)

## 2018-02-14 NOTE — Addendum Note (Signed)
Addended by: Pilar Grammes on: 02/14/2018 12:09 PM   Modules accepted: Orders

## 2018-02-14 NOTE — Assessment & Plan Note (Signed)
No issues with circulation or claudication Statin not appropriate at her age

## 2018-02-14 NOTE — Assessment & Plan Note (Signed)
Paced On sotalol and coumadin

## 2018-02-14 NOTE — Assessment & Plan Note (Signed)
Compensated. No changes needed. 

## 2018-02-14 NOTE — Assessment & Plan Note (Signed)
Known elevated PTH On vitamin D Will recheck

## 2018-02-14 NOTE — Assessment & Plan Note (Signed)
Will check labs again

## 2018-02-14 NOTE — Progress Notes (Signed)
Subjective:    Patient ID: Amanda [name], female    DOB: 1919-09-24, 84 y.o.   MRN: [id number]  HPI Here with daughter for follow up of chronic health conditions Notices easier DOE--but not striking Not as much energy to do housework---but she still does everything but vacuuming  Some foot and leg pain Uses the tramadol at night with relief  No chest pain No edema No palpitations Has to be careful getting up---takes her time and holds on Uses walker in apartment----holds onto daughter when out  Reviewed renal function No apparent uremic symptoms Known elevated PTH--does take vitamin D  Left inguinal hernia more prominent No pain  Past visualization of atherosclerotic aorta No obvious perfusion/circulation problems  Current Outpatient Medications on File Prior to Visit  Medication Sig Dispense Refill  . acetaminophen (TYLENOL) 500 MG tablet Take 500 mg by mouth every 8 (eight) hours as needed for mild pain.     . cholecalciferol (VITAMIN D) 1000 UNITS tablet Take 1,000 Units by mouth daily.    Marland Kitchen diltiazem (CARDIZEM CD) 120 MG 24 hr capsule TAKE 1 CAPSULE BY MOUTH  DAILY 90 capsule 3  . hydrochlorothiazide (HYDRODIURIL) 25 MG tablet Take 1 tablet (25 mg total) by mouth as needed. 90 tablet 3  . levothyroxine (SYNTHROID, LEVOTHROID) 75 MCG tablet TAKE 1 TABLET BY MOUTH  DAILY 90 tablet 3  . mirtazapine (REMERON) 15 MG tablet TAKE 1 TABLET BY MOUTH AT  BEDTIME 90 tablet 3  . nitroGLYCERIN (NITROSTAT) 0.4 MG SL tablet Place 0.4 mg under the tongue every 5 (five) minutes as needed for chest pain (x 3 doses).     . sotalol (BETAPACE) 120 MG tablet TAKE ONE-HALF TABLET BY  MOUTH TWICE A DAY 90 tablet 3  . traMADol (ULTRAM) 50 MG tablet TAKE 1/2 TO 1 TABLET BY MOUTH 3 TIMES A DAY AS NEEDED FOR PAIN 90 tablet 0  . trimethoprim (TRIMPEX) 100 MG tablet TAKE 1 TABLET BY MOUTH  DAILY 90 tablet 3  . warfarin (COUMADIN) 1 MG tablet TAKE 1 TABLET BY MOUTH  DAILY AS DIRECTED BY   ANTICOAGULATION CLINIC 90 tablet 1  . warfarin (COUMADIN) 4 MG tablet TAKE DAILY AS DIRECTED BY  COUMADIN CLINIC. 90 tablet 1   No current facility-administered medications on file prior to visit.     Allergies  Allergen Reactions  . Amoxicillin Diarrhea    Weakness and atrial fib  . Augmentin [Amoxicillin-Pot Clavulanate] Diarrhea    Weakness and atrial fib  . Sulfonamide Derivatives     unknown    Past Medical History:  Diagnosis Date  . Atrial fibrillation ([place])   . Depression   . GERD (gastroesophageal reflux disease)   . Hyperlipidemia   . Hypertension   . Hypothyroidism    after Rx for thyroid cancer  . Mitral regurgitation   . Osteoarthritis   . Pacemaker   . Sleep disturbance   . Urinary incontinence    usually from UTI's    Past Surgical History:  Procedure Laterality Date  . BREAST BIOPSY  2002  . BREAST LUMPECTOMY  1983  . CATARACT EXTRACTION, BILATERAL    . EP IMPLANTABLE DEVICE N/A 09/02/2015   Procedure: PPM Generator Changeout;  Surgeon: Evans Lance, MD;  Location: [facility];  Service: Cardiovascular;  Laterality: N/A;  . Goiter Removed  1976   cancerous. Then got RAI  . PACEMAKER INSERTION  5/07   after syncope  . TONSILLECTOMY AND ADENOIDECTOMY  1937  .  VESICOVAGINAL FISTULA CLOSURE W/ TAH  1972    Family History  Problem Relation Age of Onset  . Diabetes Mother   . Stroke Sister   . Coronary artery disease Brother   . Prostate cancer Brother   . Breast cancer Neg Hx   . Colon cancer Neg Hx     Social History   Socioeconomic History  . Marital status: Widowed    Spouse name: Not on file  . Number of children: 2  . Years of education: Not on file  . Highest education level: Not on file  Occupational History  . Occupation: homemaker  Social Needs  . Financial resource strain: Not on file  . Food insecurity:    Worry: Not on file    Inability: Not on file  . Transportation needs:    Medical: Not on file     Non-medical: Not on file  Tobacco Use  . Smoking status: Never Smoker  . Smokeless tobacco: Never Used  Substance and Sexual Activity  . Alcohol use: No  . Drug use: No  . Sexual activity: Not on file  Lifestyle  . Physical activity:    Days per week: Not on file    Minutes per session: Not on file  . Stress: Not on file  Relationships  . Social connections:    Talks on phone: Not on file    Gets together: Not on file    Attends religious service: Not on file    Active member of club or organization: Not on file    Attends meetings of clubs or organizations: Not on file    Relationship status: Not on file  . Intimate partner violence:    Fear of current or ex partner: Not on file    Emotionally abused: Not on file    Physically abused: Not on file    Forced sexual activity: Not on file  Other Topics Concern  . Not on file  Social History Narrative   Widowed 2002. 1 local daughter. Other in [place]. Was homemaker. Farmed and gardened.    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    Daughter [name]) is health care POA.    Discussed DNR and she requests    Would not want tube feeds if cognitively unaware   Review of Systems Sleeps okay Not much appetite--- weight is stable Discussed daily ensure (instead of light snack in afternoon)     Objective:   Physical Exam  Constitutional: No distress.  Neck: No thyromegaly present.  Cardiovascular: Normal rate and regular rhythm. Exam reveals no gallop.  No murmur heard. Respiratory: Effort normal and breath sounds normal. No respiratory distress. She has no wheezes. She has no rales.  GI: Soft. There is no tenderness.  Small easily reducible LIH  Lymphadenopathy:    She has no cervical adenopathy.  Psychiatric: She has a normal mood and affect. Her behavior is normal.           Assessment & Plan:

## 2018-02-14 NOTE — Patient Instructions (Signed)
INR today 2.3  Continue taking 3mg  daily EXCEPT for 2 mg on Mondays.  Recheck in 6 weeks.    Patient is doing very well without any complaints or concerns.  No recent changes in diet, health or medications.   Patient and daughter Amanda [name] present and verbalize understanding of instructions given today.

## 2018-02-15 LAB — PARATHYROID HORMONE, INTACT (NO CA): PTH: 48 pg/mL (ref 14–64)

## 2018-02-21 DIAGNOSIS — H903 Sensorineural hearing loss, bilateral: Secondary | ICD-10-CM | POA: Diagnosis not present

## 2018-02-21 DIAGNOSIS — H6123 Impacted cerumen, bilateral: Secondary | ICD-10-CM | POA: Diagnosis not present

## 2018-02-21 DIAGNOSIS — H7201 Central perforation of tympanic membrane, right ear: Secondary | ICD-10-CM | POA: Diagnosis not present

## 2018-02-23 ENCOUNTER — Ambulatory Visit (INDEPENDENT_AMBULATORY_CARE_PROVIDER_SITE_OTHER): Payer: Medicare Other | Admitting: *Deleted

## 2018-02-23 DIAGNOSIS — I495 Sick sinus syndrome: Secondary | ICD-10-CM

## 2018-02-23 NOTE — Progress Notes (Signed)
Remote pacemaker transmission.   

## 2018-02-26 ENCOUNTER — Encounter: Payer: Self-pay | Admitting: Cardiology

## 2018-03-03 ENCOUNTER — Other Ambulatory Visit: Payer: Self-pay

## 2018-03-03 ENCOUNTER — Emergency Department: Payer: Medicare Other

## 2018-03-03 ENCOUNTER — Encounter: Payer: Self-pay | Admitting: Emergency Medicine

## 2018-03-03 ENCOUNTER — Emergency Department
Admission: EM | Admit: 2018-03-03 | Discharge: 2018-03-03 | Disposition: A | Discharge status: Home / Self Care | Payer: Medicare Other | Attending: Emergency Medicine | Admitting: Emergency Medicine

## 2018-03-03 DIAGNOSIS — N184 Chronic kidney disease, stage 4 (severe): Secondary | ICD-10-CM | POA: Diagnosis not present

## 2018-03-03 DIAGNOSIS — Z79899 Other long term (current) drug therapy: Secondary | ICD-10-CM | POA: Diagnosis not present

## 2018-03-03 DIAGNOSIS — I130 Hypertensive heart and chronic kidney disease with heart failure and stage 1 through stage 4 chronic kidney disease, or unspecified chronic kidney disease: Secondary | ICD-10-CM | POA: Diagnosis not present

## 2018-03-03 DIAGNOSIS — Y998 Other external cause status: Secondary | ICD-10-CM | POA: Insufficient documentation

## 2018-03-03 DIAGNOSIS — S0990XA Unspecified injury of head, initial encounter: Secondary | ICD-10-CM

## 2018-03-03 DIAGNOSIS — E785 Hyperlipidemia, unspecified: Secondary | ICD-10-CM | POA: Insufficient documentation

## 2018-03-03 DIAGNOSIS — Y9301 Activity, walking, marching and hiking: Secondary | ICD-10-CM | POA: Diagnosis not present

## 2018-03-03 DIAGNOSIS — W010XXA Fall on same level from slipping, tripping and stumbling without subsequent striking against object, initial encounter: Secondary | ICD-10-CM | POA: Insufficient documentation

## 2018-03-03 DIAGNOSIS — S0003XA Contusion of scalp, initial encounter: Secondary | ICD-10-CM | POA: Diagnosis not present

## 2018-03-03 DIAGNOSIS — S199XXA Unspecified injury of neck, initial encounter: Secondary | ICD-10-CM | POA: Diagnosis not present

## 2018-03-03 DIAGNOSIS — Z7901 Long term (current) use of anticoagulants: Secondary | ICD-10-CM | POA: Diagnosis not present

## 2018-03-03 DIAGNOSIS — W19XXXA Unspecified fall, initial encounter: Secondary | ICD-10-CM

## 2018-03-03 DIAGNOSIS — S0181XA Laceration without foreign body of other part of head, initial encounter: Secondary | ICD-10-CM | POA: Diagnosis not present

## 2018-03-03 DIAGNOSIS — I5032 Chronic diastolic (congestive) heart failure: Secondary | ICD-10-CM | POA: Diagnosis not present

## 2018-03-03 DIAGNOSIS — E039 Hypothyroidism, unspecified: Secondary | ICD-10-CM | POA: Diagnosis not present

## 2018-03-03 DIAGNOSIS — I4891 Unspecified atrial fibrillation: Secondary | ICD-10-CM | POA: Insufficient documentation

## 2018-03-03 DIAGNOSIS — R220 Localized swelling, mass and lump, head: Secondary | ICD-10-CM | POA: Diagnosis not present

## 2018-03-03 DIAGNOSIS — Y92002 Bathroom of unspecified non-institutional (private) residence single-family (private) house as the place of occurrence of the external cause: Secondary | ICD-10-CM | POA: Insufficient documentation

## 2018-03-03 DIAGNOSIS — Z950 Presence of cardiac pacemaker: Secondary | ICD-10-CM | POA: Diagnosis not present

## 2018-03-03 DIAGNOSIS — S0993XA Unspecified injury of face, initial encounter: Secondary | ICD-10-CM | POA: Diagnosis not present

## 2018-03-03 LAB — PROTIME-INR
INR: 2.07
Prothrombin Time: 23 seconds — ABNORMAL HIGH (ref 11.4–15.2)

## 2018-03-03 MED ORDER — LIDOCAINE-EPINEPHRINE-TETRACAINE (LET) SOLUTION
3.0000 mL | Freq: Once | NASAL | Status: AC
  Administered 2018-03-03: 3 mL via TOPICAL
  Filled 2018-03-03: qty 3

## 2018-03-03 MED ORDER — ACETAMINOPHEN 500 MG PO TABS
1000.0000 mg | ORAL_TABLET | Freq: Once | ORAL | Status: DC
  Filled 2018-03-03: qty 2

## 2018-03-03 NOTE — ED Triage Notes (Signed)
Pt to ED from home with daughter states fall in the bathroom d/t foot getting caught on commode.  Fell on bilateral elbows and knees without obvious injury and also hit head.  Denies LOC.  Abrasion to right forehead and bruise to right eye.  Daughter reports pt takes coumadin.  Last took coumadin last night.

## 2018-03-03 NOTE — Discharge Instructions (Addendum)
You were seen in the emergency department after a fall. Luckily all of your imaging studies did not show any evidence of injuries. Follow-up with you doctor within the next 2-3 days for further evaluation. Sometimes injuries can present at a later time and therefore it is imperative that you return to the emergency room if you have a severe headache, facial droop, neck pain, numbness or weakness of your extremities, slurred speech, difficulty finding words, chest pain, back pain, abdominal pain, or any other new symptoms that were not present during this visit. You may take Tylenol at home for your pain.  Follow up with Dr. 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   Keep laceration dry and clean. Wash with warm water and soap. Apply topical bacitracin. Protect from the sun to minimize scarring. Cover it with SPF 54 or higher and use hat when out in the sun for 6-9 months. You received 2 stitches that must be removed in 7.  Watch for signs of infection: pus, redness of the skin surrounding it, or fever. If these develop see your doctor or return to the ER for antibiotics.

## 2018-03-03 NOTE — ED Notes (Signed)
Pt states that she was in the bathroom earlier today and states that her walker was not within reach of her, states that she hit her foot on the leg of a table causing her to fall and hit her head, pt has a small lac to her forehead with bruising around the area as well as bruising to the outside of her rt eye, pt denies loc, denies pain in her body, and denies neck pain. No distress noted, family at bedside

## 2018-03-03 NOTE — ED Notes (Signed)
[facility] sent to lab

## 2018-03-03 NOTE — ED Notes (Addendum)
FIRST NURSE NOTE:  Pt had fall today, tripped and fell onto hands and knees then hit her head on the floor. Pt has laceration to forehead, bleeding controlled at this time with a bandaid. Pt on coumadin, pt has ecchymosis around right eye.

## 2018-03-03 NOTE — ED Provider Notes (Signed)
[facility] Emergency Department Provider Note  ____________________________________________  Time seen: Approximately 3:15 PM  I have reviewed the triage vital signs and the nursing notes.   HISTORY  Chief Complaint Fall   HPI Amanda [name] is a 84 y.o. female with history of atrial fibrillation on Coumadin, hypertension, hyperlipidemia, hypothyroidism, sick sinus syndrome status post pacemaker who presents for evaluation of a mechanical fall.  Patient's daughter reports that patient has a tall toilet seat in her bathroom. She was walking in the bathroom when she tripped on the seat and fell on the ground. She hit her head on the floor but no LOC. She is complaining of mild forehead pain since the fall. She sustained a forehead laceration. She also complains of mild R facial pain where she has a small bruise.  No neck pain, back pain, extremity pain, chest pain, abdominal pain.  Patient reports that the fall was mechanical in nature after tripping and denies any near syncopal symptoms.   Past Medical History:  Diagnosis Date  . Atrial fibrillation ([place])   . Depression   . GERD (gastroesophageal reflux disease)   . Hyperlipidemia   . Hypertension   . Hypothyroidism    after Rx for thyroid cancer  . Mitral regurgitation   . Osteoarthritis   . Pacemaker   . Sleep disturbance   . Urinary incontinence    usually from UTI's    Patient Active Problem List   Diagnosis Date Noted  . Secondary hyperparathyroidism of renal origin ([place]) 02/14/2018  . Advance directive discussed with patient 08/16/2017  . Long term (current) use of anticoagulants 04/07/2017  . Inguinal hernia 12/21/2016  . Neuropathy 10/08/2016  . Low back pain 09/14/2016  . Pedal edema 09/14/2016  . Paroxysmal atrial fibrillation ([place]) 04/20/2016  . Anemia of chronic renal failure, stage 4 (severe) ([place]) 02/05/2015  . Recurrent cystitis 02/25/2014  . Routine general medical examination at  a health care facility 07/16/2013  . Thoracic aorta atherosclerosis ([place]) 07/16/2013  . Encounter for therapeutic drug monitoring 06/11/2013  . Chronic diastolic CHF (congestive heart failure) ([place]) 05/29/2013  . Chronic renal disease, stage IV ([place]) 12/16/2011  . GERD (gastroesophageal reflux disease)   . SICK SINUS/ TACHY-BRADY SYNDROME 12/15/2009  . PPM-Medtronic 12/15/2009  . Mitral valve disorder 12/08/2009  . Episodic mood disorder ([place]) 05/05/2009  . HEARING LOSS 04/25/2009  . Hypothyroidism 12/27/2008  . Hyperlipidemia 12/27/2008  . Essential hypertension 12/27/2008  . OSTEOARTHRITIS 12/27/2008  . SLEEP DISORDER 12/27/2008  . URINARY INCONTINENCE 12/27/2008    Past Surgical History:  Procedure Laterality Date  . BREAST BIOPSY  2002  . BREAST LUMPECTOMY  1983  . CATARACT EXTRACTION, BILATERAL    . EP IMPLANTABLE DEVICE N/A 09/02/2015   Procedure: PPM Generator Changeout;  Surgeon: Evans Lance, MD;  Location: [facility];  Service: Cardiovascular;  Laterality: N/A;  . Goiter Removed  1976   cancerous. Then got RAI  . PACEMAKER INSERTION  5/07   after syncope  . TONSILLECTOMY AND ADENOIDECTOMY  1937  . VESICOVAGINAL FISTULA CLOSURE W/ TAH  1972    Prior to Admission medications   Medication Sig Start Date End Date Taking? Authorizing Provider  acetaminophen (TYLENOL) 500 MG tablet Take 500 mg by mouth every 8 (eight) hours as needed for mild pain.     [provider]  cholecalciferol (VITAMIN D) 1000 UNITS tablet Take 1,000 Units by mouth daily.    [provider]  diltiazem (CARDIZEM CD)  120 MG 24 hr capsule TAKE 1 CAPSULE BY MOUTH  DAILY 12/09/16   Deboraha Sprang, MD  hydrochlorothiazide (HYDRODIURIL) 25 MG tablet Take 1 tablet (25 mg total) by mouth as needed. 04/20/17   Minna Merritts, MD  levothyroxine (SYNTHROID, LEVOTHROID) 75 MCG tablet TAKE 1 TABLET BY MOUTH  DAILY 01/03/17   Viviana Simpler I, MD  mirtazapine (REMERON) 15 MG tablet  TAKE 1 TABLET BY MOUTH AT  BEDTIME 05/02/17   Venia Carbon, MD  nitroGLYCERIN (NITROSTAT) 0.4 MG SL tablet Place 0.4 mg under the tongue every 5 (five) minutes as needed for chest pain (x 3 doses).     [provider]  sotalol (BETAPACE) 120 MG tablet TAKE ONE-HALF TABLET BY  MOUTH TWICE A DAY 01/31/18   Viviana Simpler I, MD  traMADol (ULTRAM) 50 MG tablet TAKE 1/2 TO 1 TABLET BY MOUTH 3 TIMES A DAY AS NEEDED FOR PAIN 12/22/17   Venia Carbon, MD  trimethoprim (TRIMPEX) 100 MG tablet TAKE 1 TABLET BY MOUTH  DAILY 08/08/17   Viviana Simpler I, MD  warfarin (COUMADIN) 1 MG tablet TAKE 1 TABLET BY MOUTH  DAILY AS DIRECTED BY  ANTICOAGULATION CLINIC 01/31/18   Viviana Simpler I, MD  warfarin (COUMADIN) 4 MG tablet TAKE DAILY AS DIRECTED BY  COUMADIN CLINIC. 01/31/18   Venia Carbon, MD    Allergies Amoxicillin; Augmentin [amoxicillin-pot clavulanate]; and Sulfonamide derivatives  Family History  Problem Relation Age of Onset  . Diabetes Mother   . Stroke Sister   . Coronary artery disease Brother   . Prostate cancer Brother   . Breast cancer Neg Hx   . Colon cancer Neg Hx     Social History Social History   Tobacco Use  . Smoking status: Never Smoker  . Smokeless tobacco: Never Used  Substance Use Topics  . Alcohol use: No  . Drug use: No    Review of Systems Constitutional: Negative for fever. Eyes: Negative for visual changes. ENT: + facial injury. No neck injury Cardiovascular: Negative for chest injury. Respiratory: Negative for shortness of breath. Negative for chest wall injury. Gastrointestinal: Negative for abdominal pain or injury. Genitourinary: Negative for dysuria. Musculoskeletal: Negative for back injury, negative for arm or leg pain. Skin: Negative for laceration/abrasions. Neurological: + head injury.   ____________________________________________   PHYSICAL EXAM:  VITAL SIGNS: ED Triage Vitals  Enc Vitals Group     BP 03/03/18 1327  (!) 145/73     Pulse Rate 03/03/18 1327 78     Resp --      Temp 03/03/18 1327 98 F (36.7 C)     Temp src --      SpO2 03/03/18 1327 96 %     Weight 03/03/18 1328 117 lb (53.1 kg)     Height 03/03/18 1328 5\' 2"  (1.575 m)     Head Circumference --      Peak Flow --      Pain Score 03/03/18 1337 0     Pain Loc --      Pain Edu? --      Excl. in [place]? --    Constitutional: Alert and oriented. No acute distress. Does not appear intoxicated. HEENT Head: Normocephalic and atraumatic. Face: No facial bony tenderness. Stable midface. Bruise over the lateral aspect of the face near her R eye. Forehead laceration Ears: No hemotympanum bilaterally. No Battle sign Eyes: No eye injury. PERRL. No raccoon eyes Nose: Nontender. No epistaxis. No rhinorrhea Mouth/Throat:  Mucous membranes are moist. No oropharyngeal blood. No dental injury. Airway patent without stridor. Normal voice. Neck: no C-collar in place. No midline c-spine tenderness.  Cardiovascular: Normal rate, regular rhythm. Normal and symmetric distal pulses are present in all extremities. Pulmonary/Chest: Chest wall is stable and nontender to palpation/compression. Normal respiratory effort. Breath sounds are normal. No crepitus.  Abdominal: Soft, nontender, non distended. Musculoskeletal: Nontender with normal full range of motion in all extremities. No deformities. No thoracic or lumbar midline spinal tenderness. Pelvis is stable. Skin: Skin is warm, dry and intact. No abrasions or contutions. Psychiatric: Speech and behavior are appropriate. Neurological: Normal speech and language. Moves all extremities to command. No gross focal neurologic deficits are appreciated.  Glascow Coma Score: 4 - Opens eyes on own 6 - Follows simple motor commands 5 - Alert and oriented GCS: 15  ____________________________________________   LABS (all labs ordered are listed, but only abnormal results are displayed)  Labs Reviewed  PROTIME-INR -  Abnormal; Notable for the following components:      Result Value   Prothrombin Time 23.0 (*)    All other components within normal limits   ____________________________________________  EKG  none  ____________________________________________  RADIOLOGY  I have personally reviewed the images performed during this visit and I agree with the Radiologist's read.   Interpretation by Radiologist:  Ct Head Wo Contrast  Result Date: 03/03/2018 CLINICAL DATA:  Patient fell today tripping and falling onto hands and knees than hitting head on the floor. Laceration of the forehead now controlled. Patient is on Coumadin. EXAM: CT HEAD WITHOUT CONTRAST CT MAXILLOFACIAL WITHOUT CONTRAST CT CERVICAL SPINE WITHOUT CONTRAST TECHNIQUE: Multidetector CT imaging of the head, cervical spine, and maxillofacial structures were performed using the standard protocol without intravenous contrast. Multiplanar CT image reconstructions of the cervical spine and maxillofacial structures were also generated. COMPARISON:  Head CT 05/12/2017 FINDINGS: CT HEAD FINDINGS Brain: Age related involutional changes of the brain. Moderate small vessel ischemic disease of periventricular and subcortical white matter. No acute intracranial mass, hemorrhage, midline shift or edema. No large vascular territory infarct. Midline fourth ventricle and basal cisterns without effacement. Brainstem and cerebellum are nonacute. Vascular: No hyperdense vessel sign or unexpected calcifications. Skull: No acute calvarial fracture. Other: Right frontal scalp contusion and laceration. CT MAXILLOFACIAL FINDINGS Osseous: No acute maxillofacial fracture. Orbits: Intact orbits and globes. No retrobulbar abnormality or hemorrhage. Sinuses: Obstruction of the right frontal sinus passage from a polypoid soft tissue masslike abnormality measuring 14 x 13 x 14 mm, series 14/26 and series 15/40. Obstruction of the right frontal sinus is new since prior exam.  Given erosive change surrounding this soft tissue mass, direct visual correlation is recommended. Findings could be related to a polyp though a malignant neoplasm is not entirely excluded. Soft tissues: Mild right forehead soft tissue swelling and laceration. CT CERVICAL SPINE FINDINGS Alignment: Slight reversal cervical lordosis. Intact craniocervical relationship. Intact atlantodental interval. Skull base and vertebrae: Intact skull base. Acute cervical spine fracture. Soft tissues and spinal canal: No prevertebral soft tissue swelling. No visible intra canal hematoma. Disc levels: Severe disc flattening C4 through C6 with moderate disc flattening at C2-3 and C6-7. Small posterior marginal osteophytes are present. Uncovertebral joint osteoarthritis with uncinate spurring is seen from C3-4 through C6-7. Mild-to-moderate neural foraminal encroachment on the left at C3-4 and C4-5 from facet and uncovertebral joint osteoarthritis. No jumped or perched facets. Upper chest: Clear lung apices. Extracranial carotid arteriosclerosis bilaterally. Other: IMPRESSION: 1. Right frontal scalp contusion  and laceration. No underlying skull fracture. 2. No acute intracranial abnormality. Chronic appearing moderate small vessel ischemic disease. 3. Obstruction of the right frontal sinus passage from a polypoid soft tissue mass measuring 14 x 13 x 14 mm causing obstruction of the right frontal sinus. The findings may be secondary to a small polyp, malignancy may also account for erosive change surrounding this masslike abnormality. Direct visual correlation is therefore recommended. 4. Cervical spondylosis without acute cervical spine fracture. Electronically Signed   By: Ashley Royalty M.D.   On: 03/03/2018 14:40   Ct Cervical Spine Wo Contrast  Result Date: 03/03/2018 CLINICAL DATA:  Patient fell today tripping and falling onto hands and knees than hitting head on the floor. Laceration of the forehead now controlled. Patient is  on Coumadin. EXAM: CT HEAD WITHOUT CONTRAST CT MAXILLOFACIAL WITHOUT CONTRAST CT CERVICAL SPINE WITHOUT CONTRAST TECHNIQUE: Multidetector CT imaging of the head, cervical spine, and maxillofacial structures were performed using the standard protocol without intravenous contrast. Multiplanar CT image reconstructions of the cervical spine and maxillofacial structures were also generated. COMPARISON:  Head CT 05/12/2017 FINDINGS: CT HEAD FINDINGS Brain: Age related involutional changes of the brain. Moderate small vessel ischemic disease of periventricular and subcortical white matter. No acute intracranial mass, hemorrhage, midline shift or edema. No large vascular territory infarct. Midline fourth ventricle and basal cisterns without effacement. Brainstem and cerebellum are nonacute. Vascular: No hyperdense vessel sign or unexpected calcifications. Skull: No acute calvarial fracture. Other: Right frontal scalp contusion and laceration. CT MAXILLOFACIAL FINDINGS Osseous: No acute maxillofacial fracture. Orbits: Intact orbits and globes. No retrobulbar abnormality or hemorrhage. Sinuses: Obstruction of the right frontal sinus passage from a polypoid soft tissue masslike abnormality measuring 14 x 13 x 14 mm, series 14/26 and series 15/40. Obstruction of the right frontal sinus is new since prior exam. Given erosive change surrounding this soft tissue mass, direct visual correlation is recommended. Findings could be related to a polyp though a malignant neoplasm is not entirely excluded. Soft tissues: Mild right forehead soft tissue swelling and laceration. CT CERVICAL SPINE FINDINGS Alignment: Slight reversal cervical lordosis. Intact craniocervical relationship. Intact atlantodental interval. Skull base and vertebrae: Intact skull base. Acute cervical spine fracture. Soft tissues and spinal canal: No prevertebral soft tissue swelling. No visible intra canal hematoma. Disc levels: Severe disc flattening C4 through C6  with moderate disc flattening at C2-3 and C6-7. Small posterior marginal osteophytes are present. Uncovertebral joint osteoarthritis with uncinate spurring is seen from C3-4 through C6-7. Mild-to-moderate neural foraminal encroachment on the left at C3-4 and C4-5 from facet and uncovertebral joint osteoarthritis. No jumped or perched facets. Upper chest: Clear lung apices. Extracranial carotid arteriosclerosis bilaterally. Other: IMPRESSION: 1. Right frontal scalp contusion and laceration. No underlying skull fracture. 2. No acute intracranial abnormality. Chronic appearing moderate small vessel ischemic disease. 3. Obstruction of the right frontal sinus passage from a polypoid soft tissue mass measuring 14 x 13 x 14 mm causing obstruction of the right frontal sinus. The findings may be secondary to a small polyp, malignancy may also account for erosive change surrounding this masslike abnormality. Direct visual correlation is therefore recommended. 4. Cervical spondylosis without acute cervical spine fracture. Electronically Signed   By: Ashley Royalty M.D.   On: 03/03/2018 14:40   Ct Maxillofacial Wo Contrast  Result Date: 03/03/2018 CLINICAL DATA:  Patient fell today tripping and falling onto hands and knees than hitting head on the floor. Laceration of the forehead now controlled. Patient is on Coumadin.  EXAM: CT HEAD WITHOUT CONTRAST CT MAXILLOFACIAL WITHOUT CONTRAST CT CERVICAL SPINE WITHOUT CONTRAST TECHNIQUE: Multidetector CT imaging of the head, cervical spine, and maxillofacial structures were performed using the standard protocol without intravenous contrast. Multiplanar CT image reconstructions of the cervical spine and maxillofacial structures were also generated. COMPARISON:  Head CT 05/12/2017 FINDINGS: CT HEAD FINDINGS Brain: Age related involutional changes of the brain. Moderate small vessel ischemic disease of periventricular and subcortical white matter. No acute intracranial mass, hemorrhage,  midline shift or edema. No large vascular territory infarct. Midline fourth ventricle and basal cisterns without effacement. Brainstem and cerebellum are nonacute. Vascular: No hyperdense vessel sign or unexpected calcifications. Skull: No acute calvarial fracture. Other: Right frontal scalp contusion and laceration. CT MAXILLOFACIAL FINDINGS Osseous: No acute maxillofacial fracture. Orbits: Intact orbits and globes. No retrobulbar abnormality or hemorrhage. Sinuses: Obstruction of the right frontal sinus passage from a polypoid soft tissue masslike abnormality measuring 14 x 13 x 14 mm, series 14/26 and series 15/40. Obstruction of the right frontal sinus is new since prior exam. Given erosive change surrounding this soft tissue mass, direct visual correlation is recommended. Findings could be related to a polyp though a malignant neoplasm is not entirely excluded. Soft tissues: Mild right forehead soft tissue swelling and laceration. CT CERVICAL SPINE FINDINGS Alignment: Slight reversal cervical lordosis. Intact craniocervical relationship. Intact atlantodental interval. Skull base and vertebrae: Intact skull base. Acute cervical spine fracture. Soft tissues and spinal canal: No prevertebral soft tissue swelling. No visible intra canal hematoma. Disc levels: Severe disc flattening C4 through C6 with moderate disc flattening at C2-3 and C6-7. Small posterior marginal osteophytes are present. Uncovertebral joint osteoarthritis with uncinate spurring is seen from C3-4 through C6-7. Mild-to-moderate neural foraminal encroachment on the left at C3-4 and C4-5 from facet and uncovertebral joint osteoarthritis. No jumped or perched facets. Upper chest: Clear lung apices. Extracranial carotid arteriosclerosis bilaterally. Other: IMPRESSION: 1. Right frontal scalp contusion and laceration. No underlying skull fracture. 2. No acute intracranial abnormality. Chronic appearing moderate small vessel ischemic disease. 3.  Obstruction of the right frontal sinus passage from a polypoid soft tissue mass measuring 14 x 13 x 14 mm causing obstruction of the right frontal sinus. The findings may be secondary to a small polyp, malignancy may also account for erosive change surrounding this masslike abnormality. Direct visual correlation is therefore recommended. 4. Cervical spondylosis without acute cervical spine fracture. Electronically Signed   By: Ashley Royalty M.D.   On: 03/03/2018 14:40     ____________________________________________   PROCEDURES  Procedure(s) performed:yes  .Marland KitchenLaceration Repair Date/Time: 03/03/2018 4:36 PM Performed by: Rudene Re, MD Authorized by: Rudene Re, MD   Consent:    Consent obtained:  Verbal   Consent given by:  Patient   Risks discussed:  Infection, pain, retained foreign body, poor cosmetic result and poor wound healing Anesthesia (see MAR for exact dosages):    Anesthesia method:  Local infiltration and topical application   Topical anesthetic:  LET Laceration details:    Location:  Face   Face location:  Forehead   Length (cm):  2 Repair type:    Repair type:  Simple Exploration:    Hemostasis achieved with:  Direct pressure and LET   Wound exploration: entire depth of wound probed and visualized     Wound extent: no foreign bodies/material noted and no underlying fracture noted     Contaminated: no   Treatment:    Area cleansed with:  Saline   Amount of  cleaning:  Extensive   Irrigation solution:  Sterile saline   Visualized foreign bodies/material removed: no   Skin repair:    Repair method:  Sutures and Steri-Strips   Suture size:  6-0   Suture material:  Nylon   Suture technique:  Simple interrupted   Number of sutures:  2 Approximation:    Approximation:  Close Post-procedure details:    Dressing:  Sterile dressing   Patient tolerance of procedure:  Tolerated well, no immediate complications   Critical Care performed:   None ____________________________________________   INITIAL IMPRESSION / ASSESSMENT AND PLAN / ED COURSE   84 y.o. female with history of atrial fibrillation on Coumadin, hypertension, hyperlipidemia, hypothyroidism, sick sinus syndrome status post pacemaker who presents for evaluation of a mechanical fall with head and face trauma.  Patient is well-appearing, neurologically intact, no signs or symptoms of basilar skull fracture.  Forehead with a small laceration which was repaired per procedure note above.  CT head, C-spine, and face showing no traumatic findings.  Patient was found to have a right frontal sinus soft tissue mass.  Discussed this finding with patient and her daughter.  Patient is seen by Dr. Pryor Ochoa at [facility], recommended follow-up with him for further evaluation of this mass and to rule out possible malignancy.  Tetanus is up-to-date.  Discussed signs and symptoms of delayed head bleed with patient and her daughter.  Patient is stable for discharge.      As part of my medical decision making, I reviewed the following data within the [place] notes reviewed and incorporated, Labs reviewed , Old chart reviewed, Radiograph reviewed , Notes from prior ED visits and [place] Controlled Substance Database    Pertinent labs & imaging results that were available during my care of the patient were reviewed by me and considered in my medical decision making (see chart for details).    ____________________________________________   FINAL CLINICAL IMPRESSION(S) / ED DIAGNOSES  Final diagnoses:  Fall, initial encounter  Injury of head, initial encounter  Laceration of forehead, initial encounter      NEW MEDICATIONS STARTED DURING THIS VISIT:  ED Discharge Orders    None       Note:  This document was prepared using Dragon voice recognition software and may include unintentional dictation errors.    Rudene Re, MD 03/03/18 [phone]

## 2018-03-03 NOTE — ED Notes (Signed)
Pt refused tylenol, states that she took some at home pta, dr Alfred Levins notified

## 2018-03-06 DIAGNOSIS — D692 Other nonthrombocytopenic purpura: Secondary | ICD-10-CM | POA: Diagnosis not present

## 2018-03-06 DIAGNOSIS — L570 Actinic keratosis: Secondary | ICD-10-CM | POA: Diagnosis not present

## 2018-03-06 DIAGNOSIS — L720 Epidermal cyst: Secondary | ICD-10-CM | POA: Diagnosis not present

## 2018-03-07 ENCOUNTER — Other Ambulatory Visit: Payer: Self-pay

## 2018-03-07 NOTE — Telephone Encounter (Signed)
Left message on VM per DPR to see how she was doing after recent ER visit for a fall.   If they call back, please note how she is doing. Thanks

## 2018-03-07 NOTE — Telephone Encounter (Signed)
Daughter, [name], called and said she was doing great and did not feel she needed to keep her appt tomorrow. I have cancelled the appt.

## 2018-03-08 ENCOUNTER — Ambulatory Visit: Payer: Medicare Other | Admitting: Internal Medicine

## 2018-03-08 ENCOUNTER — Other Ambulatory Visit: Payer: Self-pay | Admitting: Internal Medicine

## 2018-03-10 ENCOUNTER — Telehealth: Payer: Self-pay

## 2018-03-10 NOTE — Telephone Encounter (Signed)
Verbal orders given to [employer]

## 2018-03-10 NOTE — Telephone Encounter (Signed)
Please call and give the order to remove

## 2018-03-10 NOTE — Telephone Encounter (Signed)
Tyrone Schimke, nurse at [facility], called stating that patient fell on 03/03/18 and went to ER and had stitches put in. She needs these taking out today and they need a verbal order to ok this, "to remove 2 stitches from the right side of the forehead." They did not have a record of her falling and assume she went on her own and they were not notified. CB: [phone] for [place]. Their Fax number: [phone] if we need to fax it. Please review.

## 2018-03-21 ENCOUNTER — Other Ambulatory Visit: Payer: Self-pay | Admitting: Internal Medicine

## 2018-03-21 DIAGNOSIS — J322 Chronic ethmoidal sinusitis: Secondary | ICD-10-CM | POA: Diagnosis not present

## 2018-03-21 DIAGNOSIS — H903 Sensorineural hearing loss, bilateral: Secondary | ICD-10-CM | POA: Diagnosis not present

## 2018-03-21 DIAGNOSIS — J0120 Acute ethmoidal sinusitis, unspecified: Secondary | ICD-10-CM | POA: Diagnosis not present

## 2018-03-22 NOTE — Telephone Encounter (Signed)
Last filled 12-22-17 #90 Last OV 02-14-18 No Future OV [facility]

## 2018-03-23 ENCOUNTER — Ambulatory Visit (INDEPENDENT_AMBULATORY_CARE_PROVIDER_SITE_OTHER): Payer: Medicare Other | Admitting: General Practice

## 2018-03-23 DIAGNOSIS — I480 Paroxysmal atrial fibrillation: Secondary | ICD-10-CM

## 2018-03-23 DIAGNOSIS — Z7901 Long term (current) use of anticoagulants: Secondary | ICD-10-CM

## 2018-03-23 LAB — POCT INR: INR: 2 (ref 2.0–3.0)

## 2018-03-23 NOTE — Patient Instructions (Addendum)
Pre visit review using our clinic review tool, if applicable. No additional management support is needed unless otherwise documented below in the visit note.  Continue taking 3mg  daily EXCEPT for 2 mg on Mondays.  Recheck on Tuesday due to antibiotic and prednisone pack.    Patient and daughter [name] present and verbalize understanding of instructions given today.

## 2018-03-28 ENCOUNTER — Ambulatory Visit (INDEPENDENT_AMBULATORY_CARE_PROVIDER_SITE_OTHER): Payer: Medicare Other

## 2018-03-28 DIAGNOSIS — Z7901 Long term (current) use of anticoagulants: Secondary | ICD-10-CM

## 2018-03-28 DIAGNOSIS — I480 Paroxysmal atrial fibrillation: Secondary | ICD-10-CM

## 2018-03-28 LAB — POCT INR: INR: 2.4 (ref 2.0–3.0)

## 2018-03-28 NOTE — Patient Instructions (Signed)
NR: today 2.4 *Finished prednisone today and will complete abx course in next few days.    Continue taking 3mg  daily EXCEPT for 2 mg on Mondays.  Patient will follow up with doctor on the 20th and unless her puts her on any more abx therapy or prednisone, we will recheck her in 3 weeks on 04/27/18.  Patient and daughter verbalize understanding and will call me if her medications change after seeing the physician on the 20th.     Patient and daughter [name] present and verbalize understanding of instructions given today.

## 2018-03-31 ENCOUNTER — Other Ambulatory Visit: Payer: Self-pay | Admitting: Internal Medicine

## 2018-04-07 DIAGNOSIS — H348332 Tributary (branch) retinal vein occlusion, bilateral, stable: Secondary | ICD-10-CM | POA: Diagnosis not present

## 2018-04-07 DIAGNOSIS — J328 Other chronic sinusitis: Secondary | ICD-10-CM | POA: Diagnosis not present

## 2018-04-27 ENCOUNTER — Ambulatory Visit (INDEPENDENT_AMBULATORY_CARE_PROVIDER_SITE_OTHER): Payer: Medicare Other | Admitting: General Practice

## 2018-04-27 DIAGNOSIS — Z7901 Long term (current) use of anticoagulants: Secondary | ICD-10-CM

## 2018-04-27 DIAGNOSIS — I480 Paroxysmal atrial fibrillation: Secondary | ICD-10-CM

## 2018-04-27 LAB — POCT INR: INR: 2 (ref 2.0–3.0)

## 2018-04-27 NOTE — Patient Instructions (Addendum)
Pre visit review using our clinic review tool, if applicable. No additional management support is needed unless otherwise documented below in the visit note.  Continue taking 3mg  daily EXCEPT for 2 mg on Mondays.  Patient and daughter Amanda [name] present and verbalize understanding of instructions given today.

## 2018-04-28 LAB — CUP PACEART REMOTE DEVICE CHECK
Battery Impedance: 153 Ohm
Battery Remaining Longevity: 122 mo
Brady Statistic AP VP Percent: 4 %
Brady Statistic AS VS Percent: 1 %
Date Time Interrogation Session: 20191107145347
Implantable Lead Implant Date: 20070524
Implantable Lead Location: 753859
Implantable Lead Model: 5568
Implantable Pulse Generator Implant Date: 20170516
Lead Channel Impedance Value: 472 Ohm
Lead Channel Pacing Threshold Amplitude: 1.125 V
Lead Channel Pacing Threshold Pulse Width: 0.4 ms
Lead Channel Setting Pacing Pulse Width: 0.4 ms
MDC IDC LEAD IMPLANT DT: 20070524
MDC IDC LEAD LOCATION: 753860
MDC IDC MSMT BATTERY VOLTAGE: 2.79 V
MDC IDC MSMT LEADCHNL RA IMPEDANCE VALUE: 743 Ohm
MDC IDC MSMT LEADCHNL RA PACING THRESHOLD PULSEWIDTH: 0.4 ms
MDC IDC MSMT LEADCHNL RV PACING THRESHOLD AMPLITUDE: 0.75 V
MDC IDC SET LEADCHNL RA PACING AMPLITUDE: 2.25 V
MDC IDC SET LEADCHNL RV PACING AMPLITUDE: 2.5 V
MDC IDC SET LEADCHNL RV SENSING SENSITIVITY: 2 mV
MDC IDC STAT BRADY AP VS PERCENT: 95 %
MDC IDC STAT BRADY AS VP PERCENT: 0 %

## 2018-04-30 ENCOUNTER — Other Ambulatory Visit: Payer: Self-pay | Admitting: Internal Medicine

## 2018-04-30 ENCOUNTER — Other Ambulatory Visit: Payer: Self-pay | Admitting: Cardiovascular Disease

## 2018-05-22 ENCOUNTER — Other Ambulatory Visit: Payer: Self-pay

## 2018-05-22 MED ORDER — HYDROCHLOROTHIAZIDE 25 MG PO TABS: 25.0000 mg | ORAL_TABLET | ORAL | 0 refills | Status: DC | PRN

## 2018-05-22 NOTE — Telephone Encounter (Signed)
Requested Prescriptions   Signed Prescriptions Disp Refills  . hydrochlorothiazide (HYDRODIURIL) 25 MG tablet 90 tablet 0    Sig: Take 1 tablet (25 mg total) by mouth as needed.    Authorizing Provider: Minna Merritts    Ordering User: Janan Ridge

## 2018-05-25 ENCOUNTER — Ambulatory Visit (INDEPENDENT_AMBULATORY_CARE_PROVIDER_SITE_OTHER): Payer: Medicare Other | Admitting: General Practice

## 2018-05-25 ENCOUNTER — Encounter: Payer: Self-pay | Admitting: Family Medicine

## 2018-05-25 ENCOUNTER — Ambulatory Visit: Payer: Medicare Other

## 2018-05-25 ENCOUNTER — Ambulatory Visit (INDEPENDENT_AMBULATORY_CARE_PROVIDER_SITE_OTHER): Payer: Medicare Other | Admitting: Family Medicine

## 2018-05-25 ENCOUNTER — Ambulatory Visit (INDEPENDENT_AMBULATORY_CARE_PROVIDER_SITE_OTHER): Payer: Medicare Other

## 2018-05-25 VITALS — BP 132/58 | HR 73 | Temp 97.9°F | Ht 62.0 in | Wt 118.5 lb

## 2018-05-25 DIAGNOSIS — I495 Sick sinus syndrome: Secondary | ICD-10-CM | POA: Diagnosis not present

## 2018-05-25 DIAGNOSIS — Z7901 Long term (current) use of anticoagulants: Secondary | ICD-10-CM

## 2018-05-25 DIAGNOSIS — I480 Paroxysmal atrial fibrillation: Secondary | ICD-10-CM

## 2018-05-25 DIAGNOSIS — R300 Dysuria: Secondary | ICD-10-CM | POA: Diagnosis not present

## 2018-05-25 DIAGNOSIS — N390 Urinary tract infection, site not specified: Secondary | ICD-10-CM

## 2018-05-25 LAB — POCT URINALYSIS DIPSTICK
BILIRUBIN UA: NEGATIVE
Blood, UA: NEGATIVE
Glucose, UA: NEGATIVE
KETONES UA: NEGATIVE
Nitrite, UA: NEGATIVE
PH UA: 6 (ref 5.0–8.0)
Protein, UA: NEGATIVE
Spec Grav, UA: 1.015 (ref 1.010–1.025)
Urobilinogen, UA: 0.2 E.U./dL

## 2018-05-25 LAB — POCT INR: INR: 2.9 (ref 2.0–3.0)

## 2018-05-25 MED ORDER — CEFUROXIME AXETIL 250 MG PO TABS: 250.0000 mg | ORAL_TABLET | Freq: Two times a day (BID) | ORAL | 0 refills | Status: AC

## 2018-05-25 NOTE — Patient Instructions (Addendum)
Pre visit review using our clinic review tool, if applicable. No additional management support is needed unless otherwise documented below in the visit note.  Continue taking 3mg  daily EXCEPT for 2 mg on Mondays. Patent will see PCP today for possible UTI.  This RN will call patient if coumadin dosage needs to be changed due to antibiotics pending outcome of UA.  Patient and daughter [name] present and verbalize understanding of instructions given today.

## 2018-05-25 NOTE — Progress Notes (Signed)
   Subjective:     Amanda [name] is a 85 y.o. female presenting for Urinary Urgency (Symptoms started yesterday-05/24/2018. Burning with urination, bladder discomfort, chills present. No fever at this time. patient states Ceftin works the best for patient for these symptoms and does not interact with other medications.)     HPI   #Urinary urgency - burning and urgency - symptoms x 1 day - Some abdominal pain   Review of Systems  Constitutional: Negative for chills and fever.  Respiratory: Negative for shortness of breath.   Cardiovascular: Negative for chest pain.  Gastrointestinal: Positive for abdominal distention. Negative for nausea and vomiting.  Genitourinary: Positive for difficulty urinating, dysuria and frequency. Negative for hematuria.     Social History   Tobacco Use  Smoking Status Never Smoker  Smokeless Tobacco Never Used        Objective:    BP Readings from Last 3 Encounters:  05/25/18 (!) 132/58  03/03/18 (!) 174/94  02/14/18 110/70   Wt Readings from Last 3 Encounters:  05/25/18 118 lb 8 oz (53.8 kg)  03/03/18 117 lb (53.1 kg)  02/14/18 118 lb (53.5 kg)    BP (!) 132/58   Pulse 73   Temp 97.9 F (36.6 C)   Ht 5\' 2"  (1.575 m)   Wt 118 lb 8 oz (53.8 kg)   SpO2 96%   BMI 21.67 kg/m    Physical Exam Constitutional:      General: She is not in acute distress.    Appearance: She is well-developed. She is not diaphoretic.  HENT:     Head: Normocephalic and atraumatic.  Eyes:     Conjunctiva/sclera: Conjunctivae normal.  Neck:     Musculoskeletal: Neck supple.  Cardiovascular:     Rate and Rhythm: Normal rate and regular rhythm.     Heart sounds: Normal heart sounds.  Pulmonary:     Effort: Pulmonary effort is normal.  Abdominal:     General: Bowel sounds are normal. There is no distension.     Palpations: Abdomen is soft.     Tenderness: There is no abdominal tenderness. There is no guarding.  Skin:    General: Skin is warm and  dry.  Neurological:     Mental Status: She is alert.       UA: + LE    Assessment & Plan:   Problem List Items Addressed This Visit    None    Visit Diagnoses    Dysuria    -  Primary   Relevant Orders   POCT urinalysis dipstick (Completed)   Urine Culture   Urinary tract infection without hematuria, site unspecified       Relevant Medications   cefUROXime (CEFTIN) 250 MG tablet   Other Relevant Orders   Urine Culture     Will treat given UA and symptoms. Ceftin is what she tolerated in the past so will prescribe this again.   Return if symptoms worsen or fail to improve.  Lesleigh Noe, MD

## 2018-05-25 NOTE — Patient Instructions (Signed)
Start the medication  Will let you know if there are any changes based on the results of the culture

## 2018-05-26 LAB — URINE CULTURE
MICRO NUMBER: 161236
RESULT: NO GROWTH
SPECIMEN QUALITY: ADEQUATE

## 2018-05-26 LAB — CUP PACEART REMOTE DEVICE CHECK
Battery Impedance: 177 Ohm
Battery Voltage: 2.79 V
Brady Statistic AP VP Percent: 3 %
Brady Statistic AS VP Percent: 0 %
Date Time Interrogation Session: 20200206192448
Implantable Lead Implant Date: 20070524
Implantable Lead Location: 753860
Implantable Lead Model: 5076
Implantable Lead Model: 5568
Lead Channel Impedance Value: 730 Ohm
Lead Channel Pacing Threshold Amplitude: 1 V
Lead Channel Pacing Threshold Pulse Width: 0.4 ms
Lead Channel Pacing Threshold Pulse Width: 0.4 ms
Lead Channel Setting Pacing Amplitude: 2.5 V
Lead Channel Setting Pacing Pulse Width: 0.4 ms
MDC IDC LEAD IMPLANT DT: 20070524
MDC IDC LEAD LOCATION: 753859
MDC IDC MSMT BATTERY REMAINING LONGEVITY: 117 mo
MDC IDC MSMT LEADCHNL RV IMPEDANCE VALUE: 470 Ohm
MDC IDC MSMT LEADCHNL RV PACING THRESHOLD AMPLITUDE: 0.75 V
MDC IDC PG IMPLANT DT: 20170516
MDC IDC SET LEADCHNL RA PACING AMPLITUDE: 2.25 V
MDC IDC SET LEADCHNL RV SENSING SENSITIVITY: 2 mV
MDC IDC STAT BRADY AP VS PERCENT: 96 %
MDC IDC STAT BRADY AS VS PERCENT: 1 %

## 2018-05-30 DIAGNOSIS — J310 Chronic rhinitis: Secondary | ICD-10-CM | POA: Diagnosis not present

## 2018-05-30 DIAGNOSIS — J329 Chronic sinusitis, unspecified: Secondary | ICD-10-CM | POA: Diagnosis not present

## 2018-06-06 NOTE — Progress Notes (Signed)
Remote pacemaker transmission.   

## 2018-06-14 ENCOUNTER — Other Ambulatory Visit: Payer: Self-pay | Admitting: Internal Medicine

## 2018-06-14 NOTE — Telephone Encounter (Signed)
Last filled 03-22-18 #90 Last OV Acute 05-25-18 Next OV 06-29-18 [facility]

## 2018-06-29 ENCOUNTER — Ambulatory Visit (INDEPENDENT_AMBULATORY_CARE_PROVIDER_SITE_OTHER): Payer: Medicare Other | Admitting: General Practice

## 2018-06-29 ENCOUNTER — Other Ambulatory Visit: Payer: Self-pay

## 2018-06-29 DIAGNOSIS — Z7901 Long term (current) use of anticoagulants: Secondary | ICD-10-CM | POA: Diagnosis not present

## 2018-06-29 DIAGNOSIS — I480 Paroxysmal atrial fibrillation: Secondary | ICD-10-CM

## 2018-06-29 LAB — POCT INR: INR: 1.9 — AB (ref 2.0–3.0)

## 2018-06-29 NOTE — Patient Instructions (Signed)
Pre visit review using our clinic review tool, if applicable. No additional management support is needed unless otherwise documented below in the visit note.  Take 4 mg today and then continue taking 3mg  daily EXCEPT for 2 mg on Mondays.Re-check in 4 Patient and daughter [name] present and verbalize understanding of instructions given today.

## 2018-07-18 ENCOUNTER — Telehealth: Payer: Self-pay

## 2018-07-18 NOTE — Telephone Encounter (Signed)
Due to difficulty in getting patient here during the [place] outbreak situation, I am working on getting patient set up with a home INR meter.  Spoke with [name] (pt daughter) who is able to take over and assist mother with the meter and check as well as coumadin home management.   I have placed the order with [employer] and am waiting on the fax order form to complete with physician signature.  Will forward paperwork to PCP for completion once received.   FYI to Dr. Silvio Pate.

## 2018-07-19 NOTE — Telephone Encounter (Signed)
That sounds good. I will sign the paperwork as soon as I hear it

## 2018-07-20 ENCOUNTER — Other Ambulatory Visit: Payer: Self-pay | Admitting: Internal Medicine

## 2018-07-21 NOTE — Telephone Encounter (Signed)
Noted. Signed forms obtained and all order information faxed back to [employer] for processing.

## 2018-07-21 NOTE — Telephone Encounter (Signed)
Patient is compliant with medications and coumadin management will refill X 58mths.

## 2018-07-25 ENCOUNTER — Ambulatory Visit: Payer: Medicare Other

## 2018-08-25 NOTE — Telephone Encounter (Signed)
I have left message with [employer] home monitoring INR company to verify status of meter set up.  Patient still has not received her home meter and is now past due for INR check.  I spoke with daughter [name] today and apologized for the delay as we have never had this issues before but understand that due to the COVID situation the company is very taxed.    In the interim until patient receives meter and set up, we will have patient come for car side INR service and have set her up for INR check on Thursday 08/31/18.  Daughter aware of screening questions and car side service process.  She thanks me for my call and follow up.  FYI to Dr. Silvio Pate.

## 2018-08-26 NOTE — Telephone Encounter (Signed)
Okay Thanks for your efforts. It will remain important for her to social distance for some time to come

## 2018-08-31 ENCOUNTER — Ambulatory Visit (INDEPENDENT_AMBULATORY_CARE_PROVIDER_SITE_OTHER): Payer: Medicare Other | Admitting: General Practice

## 2018-08-31 ENCOUNTER — Other Ambulatory Visit: Payer: Self-pay

## 2018-08-31 ENCOUNTER — Encounter: Payer: Medicare Other | Admitting: *Deleted

## 2018-08-31 ENCOUNTER — Other Ambulatory Visit (INDEPENDENT_AMBULATORY_CARE_PROVIDER_SITE_OTHER): Payer: Medicare Other

## 2018-08-31 DIAGNOSIS — I480 Paroxysmal atrial fibrillation: Secondary | ICD-10-CM

## 2018-08-31 DIAGNOSIS — Z5181 Encounter for therapeutic drug level monitoring: Secondary | ICD-10-CM | POA: Diagnosis not present

## 2018-08-31 DIAGNOSIS — Z7901 Long term (current) use of anticoagulants: Secondary | ICD-10-CM

## 2018-08-31 LAB — POCT INR: INR: 1.9 — AB (ref 2.0–3.0)

## 2018-08-31 NOTE — Patient Instructions (Signed)
Pre visit review using our clinic review tool, if applicable. No additional management support is needed unless otherwise documented below in the visit note.  Take 5 mg today and then change dosage and take 3mg  daily .  Re-check in 4 Patient and daughter [name] present and verbalize understanding of instructions given today.

## 2018-09-01 ENCOUNTER — Telehealth: Payer: Self-pay

## 2018-09-01 NOTE — Telephone Encounter (Signed)
Left message for patient to remind of missed remote transmission.  

## 2018-09-03 ENCOUNTER — Other Ambulatory Visit: Payer: Self-pay | Admitting: Internal Medicine

## 2018-09-04 NOTE — Telephone Encounter (Signed)
Last filled 06-14-18 #90 Last OV Acute 05-25-18 No Future OV [facility]

## 2018-09-07 ENCOUNTER — Ambulatory Visit (INDEPENDENT_AMBULATORY_CARE_PROVIDER_SITE_OTHER): Payer: Medicare Other | Admitting: *Deleted

## 2018-09-07 DIAGNOSIS — I495 Sick sinus syndrome: Secondary | ICD-10-CM | POA: Diagnosis not present

## 2018-09-07 LAB — CUP PACEART REMOTE DEVICE CHECK
Battery Impedance: 202 Ohm
Battery Remaining Longevity: 114 mo
Battery Voltage: 2.79 V
Brady Statistic AP VP Percent: 3 %
Brady Statistic AP VS Percent: 96 %
Brady Statistic AS VP Percent: 0 %
Brady Statistic AS VS Percent: 1 %
Date Time Interrogation Session: 20200521151003
Implantable Lead Implant Date: 20070524
Implantable Lead Implant Date: 20070524
Implantable Lead Location: 753859
Implantable Lead Location: 753860
Implantable Lead Model: 5076
Implantable Lead Model: 5568
Implantable Pulse Generator Implant Date: 20170516
Lead Channel Impedance Value: 478 Ohm
Lead Channel Impedance Value: 756 Ohm
Lead Channel Pacing Threshold Amplitude: 0.625 V
Lead Channel Pacing Threshold Amplitude: 1.125 V
Lead Channel Pacing Threshold Pulse Width: 0.4 ms
Lead Channel Pacing Threshold Pulse Width: 0.4 ms
Lead Channel Sensing Intrinsic Amplitude: 5.6 mV
Lead Channel Setting Pacing Amplitude: 2.25 V
Lead Channel Setting Pacing Amplitude: 2.5 V
Lead Channel Setting Pacing Pulse Width: 0.4 ms
Lead Channel Setting Sensing Sensitivity: 2 mV

## 2018-09-18 ENCOUNTER — Encounter: Payer: Self-pay | Admitting: Cardiology

## 2018-09-18 NOTE — Progress Notes (Signed)
Remote pacemaker transmission.   

## 2018-09-28 ENCOUNTER — Other Ambulatory Visit: Payer: Self-pay

## 2018-09-28 ENCOUNTER — Other Ambulatory Visit (INDEPENDENT_AMBULATORY_CARE_PROVIDER_SITE_OTHER): Payer: Medicare Other

## 2018-09-28 ENCOUNTER — Ambulatory Visit (INDEPENDENT_AMBULATORY_CARE_PROVIDER_SITE_OTHER): Payer: Medicare Other

## 2018-09-28 DIAGNOSIS — Z7901 Long term (current) use of anticoagulants: Secondary | ICD-10-CM

## 2018-09-28 DIAGNOSIS — I480 Paroxysmal atrial fibrillation: Secondary | ICD-10-CM

## 2018-09-28 DIAGNOSIS — Z5181 Encounter for therapeutic drug level monitoring: Secondary | ICD-10-CM

## 2018-09-28 LAB — POCT INR: INR: 2.3 (ref 2.0–3.0)

## 2018-09-28 NOTE — Patient Instructions (Signed)
Pre visit review using our clinic review tool, if applicable. No additional management support is needed unless otherwise documented below in the visit note.  Continue to take 3mg  daily .  Re-check in 4 weeks. Dosing instructions given to daughter, [name] and she did verbalize understanding.

## 2018-10-07 NOTE — Progress Notes (Signed)
   Subjective:    Patient ID: Amanda [name], female    DOB: Jun 23, 1919, 85 y.o.   MRN: [id number]  HPI  Reviewed INR and plans  Review of Systems     Objective:   Physical Exam         Assessment & Plan:

## 2018-10-26 ENCOUNTER — Ambulatory Visit (INDEPENDENT_AMBULATORY_CARE_PROVIDER_SITE_OTHER): Payer: Medicare Other | Admitting: General Practice

## 2018-10-26 DIAGNOSIS — Z7901 Long term (current) use of anticoagulants: Secondary | ICD-10-CM

## 2018-10-26 LAB — POCT INR: INR: 2.7 (ref 2.0–3.0)

## 2018-10-26 NOTE — Patient Instructions (Addendum)
Pre visit review using our clinic review tool, if applicable. No additional management support is needed unless otherwise documented below in the visit note.  Continue to take 3mg  daily .  Re-check in 4 weeks. Dosing instructions given to daughter, [name] and she did verbalize understanding.

## 2018-10-30 ENCOUNTER — Telehealth: Payer: Self-pay

## 2018-10-30 NOTE — Telephone Encounter (Signed)
[name] Koger left v/m requesting cb from [employer]; to questions with protime equipment for testing pts PT at home.

## 2018-11-01 NOTE — Telephone Encounter (Signed)
Patients daughter [name] and I spoke regarding home monitoring for patient.   They are having trouble with the wireless connectivity and it has been overwhelming to her mother, even with the daughter overseeing it.   They likely will return the home meter and continue coming in for in office INR checks per patient's request.   Daughter to make 1 more attempt with mother to set this up but if it is not working then she will let me know and they will send back equipment.   Patient is coming in for in office checks at the present while trying to work this out.   FYI to Dr. Silvio Pate.

## 2018-11-01 NOTE — Telephone Encounter (Signed)
okay

## 2018-11-02 ENCOUNTER — Ambulatory Visit: Payer: Self-pay

## 2018-11-02 DIAGNOSIS — Z7901 Long term (current) use of anticoagulants: Secondary | ICD-10-CM

## 2018-11-02 DIAGNOSIS — I480 Paroxysmal atrial fibrillation: Secondary | ICD-10-CM | POA: Diagnosis not present

## 2018-11-02 LAB — POCT INR: INR: 3.1 — AB (ref 2.0–3.0)

## 2018-11-02 NOTE — Patient Instructions (Signed)
INR today 3.1  *Report called in by daughter who checked at home using in home meter  Hold coumadin today (7/16) and then resume prior dosing of 3mg  daily.  Recheck via home monitoring device in 2 weeks.   Left detailed message with instructions on daughter, [name]'s cell number.  She will call me back if any questions or concerns. Otherwise she will call me with the 7/30 reading.

## 2018-11-02 NOTE — Telephone Encounter (Signed)
Returned call to [facility] and left detailed message on her cell with all instructions.  Please refer to coag encounter for details.

## 2018-11-02 NOTE — Telephone Encounter (Signed)
Amanda [name] [facility]([phone], home [phone]) called back to report INR results. She has been training today to manage pt at home. Results today were 3.1. The [employer] nurse sad she should call and report. Please call with any special instructions. OK to leave a detailed message if no answer.

## 2018-11-23 ENCOUNTER — Ambulatory Visit (INDEPENDENT_AMBULATORY_CARE_PROVIDER_SITE_OTHER): Payer: Medicare Other | Admitting: General Practice

## 2018-11-23 DIAGNOSIS — Z7901 Long term (current) use of anticoagulants: Secondary | ICD-10-CM

## 2018-11-23 DIAGNOSIS — I480 Paroxysmal atrial fibrillation: Secondary | ICD-10-CM

## 2018-11-23 LAB — POCT INR: INR: 2.1 (ref 2.0–3.0)

## 2018-11-23 NOTE — Patient Instructions (Addendum)
Pre visit review using our clinic review tool, if applicable. No additional management support is needed unless otherwise documented below in the visit note.  Continue to take 3 mg daily and re-check in 4 weeks.

## 2018-12-07 ENCOUNTER — Telehealth: Payer: Self-pay | Admitting: Internal Medicine

## 2018-12-07 ENCOUNTER — Encounter: Payer: Medicare Other | Admitting: *Deleted

## 2018-12-07 NOTE — Telephone Encounter (Signed)
Patient  Daughter states she was unable to get transmission to go through and will call to reschedule when she receives the new [employer] device

## 2018-12-11 ENCOUNTER — Telehealth: Payer: Self-pay | Admitting: Internal Medicine

## 2018-12-11 NOTE — Telephone Encounter (Signed)
I spoke with the pt daughter and she did speak with [employer] and they state the pt should get a new monitor in 7-10 business days. I did not see an order date in [place]. I told her I will look again first thing Tuesday morning to see if I see an order date. If I do not see the order date I will call [employer] about the monitor. The pt verbalized understanding and thanked me for the call.

## 2018-12-13 NOTE — Telephone Encounter (Signed)
Last filled 09/04/2018 #90 Last OV with you 02/14/2018  No Future OV [facility]

## 2018-12-14 ENCOUNTER — Telehealth: Payer: Self-pay

## 2018-12-14 NOTE — Telephone Encounter (Signed)
Pt daughter called stating she has received the new monitor and will go to do the transmission with the pt tomorrow.

## 2018-12-14 NOTE — Telephone Encounter (Signed)
Please set up at least a virtual visit in the near future

## 2018-12-14 NOTE — Telephone Encounter (Signed)
Patient's daughter,[name],scheduled an in office appointment on 12/26/18.

## 2018-12-14 NOTE — Telephone Encounter (Signed)
Pt's daughter called back and stated she only has 2 left. I have sent the request to Dr Silvio Pate.

## 2018-12-14 NOTE — Telephone Encounter (Signed)
Okay sounds good 

## 2018-12-15 ENCOUNTER — Encounter: Payer: Medicare Other | Admitting: *Deleted

## 2018-12-19 ENCOUNTER — Encounter: Payer: Self-pay | Admitting: Cardiology

## 2018-12-21 DIAGNOSIS — H6123 Impacted cerumen, bilateral: Secondary | ICD-10-CM | POA: Diagnosis not present

## 2018-12-21 DIAGNOSIS — H903 Sensorineural hearing loss, bilateral: Secondary | ICD-10-CM | POA: Diagnosis not present

## 2018-12-21 DIAGNOSIS — J328 Other chronic sinusitis: Secondary | ICD-10-CM | POA: Diagnosis not present

## 2018-12-26 ENCOUNTER — Ambulatory Visit (INDEPENDENT_AMBULATORY_CARE_PROVIDER_SITE_OTHER): Payer: Medicare Other

## 2018-12-26 ENCOUNTER — Encounter: Payer: Self-pay | Admitting: Internal Medicine

## 2018-12-26 ENCOUNTER — Ambulatory Visit (INDEPENDENT_AMBULATORY_CARE_PROVIDER_SITE_OTHER): Payer: Medicare Other | Admitting: Internal Medicine

## 2018-12-26 ENCOUNTER — Other Ambulatory Visit: Payer: Self-pay

## 2018-12-26 VITALS — BP 124/80 | HR 83 | Temp 98.6°F | Ht 62.0 in | Wt 115.1 lb

## 2018-12-26 DIAGNOSIS — N184 Chronic kidney disease, stage 4 (severe): Secondary | ICD-10-CM | POA: Diagnosis not present

## 2018-12-26 DIAGNOSIS — I700 Atherosclerosis of aorta: Secondary | ICD-10-CM

## 2018-12-26 DIAGNOSIS — F39 Unspecified mood [affective] disorder: Secondary | ICD-10-CM

## 2018-12-26 DIAGNOSIS — I5032 Chronic diastolic (congestive) heart failure: Secondary | ICD-10-CM | POA: Diagnosis not present

## 2018-12-26 DIAGNOSIS — Z23 Encounter for immunization: Secondary | ICD-10-CM | POA: Diagnosis not present

## 2018-12-26 DIAGNOSIS — G2581 Restless legs syndrome: Secondary | ICD-10-CM

## 2018-12-26 DIAGNOSIS — N2581 Secondary hyperparathyroidism of renal origin: Secondary | ICD-10-CM | POA: Diagnosis not present

## 2018-12-26 DIAGNOSIS — I480 Paroxysmal atrial fibrillation: Secondary | ICD-10-CM

## 2018-12-26 DIAGNOSIS — Z7901 Long term (current) use of anticoagulants: Secondary | ICD-10-CM | POA: Diagnosis not present

## 2018-12-26 LAB — HEPATIC FUNCTION PANEL
ALT: 7 U/L (ref 0–35)
AST: 18 U/L (ref 0–37)
Albumin: 4.1 g/dL (ref 3.5–5.2)
Alkaline Phosphatase: 72 U/L (ref 39–117)
Bilirubin, Direct: 0.1 mg/dL (ref 0.0–0.3)
Total Bilirubin: 0.4 mg/dL (ref 0.2–1.2)
Total Protein: 6.9 g/dL (ref 6.0–8.3)

## 2018-12-26 LAB — RENAL FUNCTION PANEL
Albumin: 4.1 g/dL (ref 3.5–5.2)
BUN: 37 mg/dL — ABNORMAL HIGH (ref 6–23)
CO2: 31 mEq/L (ref 19–32)
Calcium: 9.9 mg/dL (ref 8.4–10.5)
Chloride: 100 mEq/L (ref 96–112)
Creatinine, Ser: 2.08 mg/dL — ABNORMAL HIGH (ref 0.40–1.20)
GFR: 22.04 mL/min — ABNORMAL LOW (ref >60.00)
Glucose, Bld: 95 mg/dL (ref 70–99)
Phosphorus: 4 mg/dL (ref 2.3–4.6)
Potassium: 4.5 mEq/L (ref 3.5–5.1)
Sodium: 139 mEq/L (ref 135–145)

## 2018-12-26 LAB — T4, FREE: Free T4: 0.98 ng/dL (ref 0.60–1.60)

## 2018-12-26 LAB — VITAMIN D 25 HYDROXY (VIT D DEFICIENCY, FRACTURES): VITD: 52.99 ng/mL (ref 30.00–100.00)

## 2018-12-26 LAB — POCT INR: INR: 2.8 (ref 2.0–3.0)

## 2018-12-26 NOTE — Assessment & Plan Note (Signed)
No uremic symptoms Will recheck labs

## 2018-12-26 NOTE — Assessment & Plan Note (Signed)
Tramadol does help some

## 2018-12-26 NOTE — Addendum Note (Signed)
Addended by: Virl Cagey on: 12/26/2018 11:38 AM   Modules accepted: Orders

## 2018-12-26 NOTE — Assessment & Plan Note (Signed)
Is on vitamin D Will check labs

## 2018-12-26 NOTE — Assessment & Plan Note (Signed)
Chronic dysthymia Not MDD On mirtazapine and stable

## 2018-12-26 NOTE — Assessment & Plan Note (Signed)
Compensated. No changes needed. 

## 2018-12-26 NOTE — Assessment & Plan Note (Signed)
No statin given her age

## 2018-12-26 NOTE — Assessment & Plan Note (Signed)
Paced She gets occasional symptoms with activity On coumadin and sotalol

## 2018-12-26 NOTE — Progress Notes (Signed)
Subjective:    Patient ID: Amanda [name], female    DOB: [birth year], 85 y.o.   MRN: [id number]  HPI Here with daughter for follow up of multiple chronic health conditions  Mostly stays home on her own anyway--doing okay with COVID She still does the instrumental ADLs---daughter just does the vacuuming Daughter shops with her--or for her  She feels "feeble" at times No falls Does walk outside some  Occasional down days Daughter notes she "frets" at times No persistent depression Not anhedonic--enjoys reading, TV, seeing great great grandson Some sleep problems---restless legs is an issue (tramadol helps this)  Pacer is checked regularly Will get occasional sense of fast heart--when "doing something" No recent evening edema No SOB No chest pain Gets dizziness at times--will have to sit for a while till it passes  No nausea, confusion or other uremic symptoms Remains on vitamin D Known elevated PTH  Known atherosclerotic aorta On coumadin  Current Outpatient Medications on File Prior to Visit  Medication Sig Dispense Refill  . acetaminophen (TYLENOL) 500 MG tablet Take 500 mg by mouth every 8 (eight) hours as needed for mild pain.     . cholecalciferol (VITAMIN D) 1000 UNITS tablet Take 1,000 Units by mouth daily.    Marland Kitchen diltiazem (CARDIZEM CD) 120 MG 24 hr capsule TAKE 1 CAPSULE BY MOUTH  DAILY 90 capsule 3  . hydrochlorothiazide (HYDRODIURIL) 25 MG tablet Take 1 tablet (25 mg total) by mouth as needed. 90 tablet 0  . levothyroxine (SYNTHROID, LEVOTHROID) 75 MCG tablet TAKE 1 TABLET BY MOUTH  DAILY 90 tablet 3  . mirtazapine (REMERON) 15 MG tablet TAKE 1 TABLET BY MOUTH AT  BEDTIME 90 tablet 3  . nitroGLYCERIN (NITROSTAT) 0.4 MG SL tablet Place 0.4 mg under the tongue every 5 (five) minutes as needed for chest pain (x 3 doses).     . sotalol (BETAPACE) 120 MG tablet TAKE ONE-HALF TABLET BY  MOUTH TWICE A DAY 90 tablet 3  . traMADol (ULTRAM) 50 MG tablet TAKE 1/2 TO 1 TABLET  BY MOUTH 3 TIMES A DAY AS NEEDED FOR PAIN 90 tablet 0  . trimethoprim (TRIMPEX) 100 MG tablet TAKE 1 TABLET BY MOUTH  DAILY 90 tablet 3  . warfarin (COUMADIN) 1 MG tablet TAKE 1 TABLET BY MOUTH  DAILY AS DIRECTED BY  ANTICOAGULATION CLINIC 90 tablet 1  . warfarin (COUMADIN) 4 MG tablet TAKE 1 TABLET BY MOUTH  DAILY OR AS DIRECTED BY  COUMADIN CLINIC 90 tablet 1   No current facility-administered medications on file prior to visit.     Allergies  Allergen Reactions  . Amoxicillin Diarrhea    Weakness and atrial fib  . Augmentin [Amoxicillin-Pot Clavulanate] Diarrhea    Weakness and atrial fib  . Sulfonamide Derivatives     unknown    Past Medical History:  Diagnosis Date  . Atrial fibrillation ([place])   . Depression   . GERD (gastroesophageal reflux disease)   . Hyperlipidemia   . Hypertension   . Hypothyroidism    after Rx for thyroid cancer  . Mitral regurgitation   . Osteoarthritis   . Pacemaker   . Sleep disturbance   . Urinary incontinence    usually from UTI's    Past Surgical History:  Procedure Laterality Date  . BREAST BIOPSY  2002  . BREAST LUMPECTOMY  1983  . CATARACT EXTRACTION, BILATERAL    . EP IMPLANTABLE DEVICE N/A 09/02/2015   Procedure: PPM Generator Changeout;  Surgeon: Champ Mungo  Lovena Le, MD;  Location: [facility];  Service: Cardiovascular;  Laterality: N/A;  . Goiter Removed  1976   cancerous. Then got RAI  . PACEMAKER INSERTION  5/07   after syncope  . TONSILLECTOMY AND ADENOIDECTOMY  1937  . VESICOVAGINAL FISTULA CLOSURE W/ TAH  1972    Family History  Problem Relation Age of Onset  . Diabetes Mother   . Stroke Sister   . Coronary artery disease Brother   . Prostate cancer Brother   . Breast cancer Neg Hx   . Colon cancer Neg Hx     Social History   Socioeconomic History  . Marital status: Widowed    Spouse name: Not on file  . Number of children: 2  . Years of education: Not on file  . Highest education level: Not on file   Occupational History  . Occupation: homemaker  Social Needs  . Financial resource strain: Not on file  . Food insecurity    Worry: Not on file    Inability: Not on file  . Transportation needs    Medical: Not on file    Non-medical: Not on file  Tobacco Use  . Smoking status: Never Smoker  . Smokeless tobacco: Never Used  Substance and Sexual Activity  . Alcohol use: No  . Drug use: No  . Sexual activity: Not on file  Lifestyle  . Physical activity    Days per week: Not on file    Minutes per session: Not on file  . Stress: Not on file  Relationships  . Social Herbalist on phone: Not on file    Gets together: Not on file    Attends religious service: Not on file    Active member of club or organization: Not on file    Attends meetings of clubs or organizations: Not on file    Relationship status: Not on file  . Intimate partner violence    Fear of current or ex partner: Not on file    Emotionally abused: Not on file    Physically abused: Not on file    Forced sexual activity: Not on file  Other Topics Concern  . Not on file  Social History Narrative   Widowed 2002. 1 local daughter. 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 Was homemaker. Farmed and gardened.    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    Daughter Amanda [name]) is health care POA.    Discussed DNR and she requests    Would not want tube feeds if cognitively unaware   Review of Systems Bowels okay with prunes/prune juice Voids okay--some urgency but no incontinence Appetite is still not great Weight is stable Hernia is larger--no pain    Objective:   Physical Exam  Constitutional: She appears well-developed. No distress.  Neck: No thyromegaly present.  Cardiovascular: Normal rate, regular rhythm and normal heart sounds. Exam reveals no gallop.  No murmur heard. Respiratory: Effort normal and breath sounds normal. No respiratory distress. She has no wheezes. She has no rales.  GI: Soft. There is no abdominal tenderness.   Musculoskeletal:        General: No edema.  Lymphadenopathy:    She has no cervical adenopathy.  Psychiatric: She has a normal mood and affect. Her behavior is normal.           Assessment & Plan:

## 2018-12-26 NOTE — Patient Instructions (Signed)
INR today:  2.8  Continue to take 3 mg daily and re-check in 4 weeks.  No changes to diet, health or medications.

## 2018-12-27 LAB — PARATHYROID HORMONE, INTACT (NO CA): PTH: 90 pg/mL — ABNORMAL HIGH (ref 14–64)

## 2018-12-28 ENCOUNTER — Ambulatory Visit: Payer: Medicare Other

## 2019-01-02 ENCOUNTER — Other Ambulatory Visit: Payer: Self-pay | Admitting: Internal Medicine

## 2019-01-05 ENCOUNTER — Other Ambulatory Visit: Payer: Self-pay

## 2019-01-05 ENCOUNTER — Ambulatory Visit (INDEPENDENT_AMBULATORY_CARE_PROVIDER_SITE_OTHER): Payer: Medicare Other | Admitting: Internal Medicine

## 2019-01-05 ENCOUNTER — Encounter: Payer: Self-pay | Admitting: Internal Medicine

## 2019-01-05 VITALS — BP 128/78 | HR 70 | Temp 98.2°F | Ht 62.0 in | Wt 115.0 lb

## 2019-01-05 DIAGNOSIS — N3000 Acute cystitis without hematuria: Secondary | ICD-10-CM | POA: Diagnosis not present

## 2019-01-05 DIAGNOSIS — R300 Dysuria: Secondary | ICD-10-CM

## 2019-01-05 LAB — POC URINALSYSI DIPSTICK (AUTOMATED)
Bilirubin, UA: NEGATIVE
Glucose, UA: NEGATIVE
Ketones, UA: NEGATIVE
Nitrite, UA: NEGATIVE
Protein, UA: POSITIVE — AB
Spec Grav, UA: 1.025 (ref 1.010–1.025)
Urobilinogen, UA: 0.2 E.U./dL
pH, UA: 6 (ref 5.0–8.0)

## 2019-01-05 MED ORDER — CEPHALEXIN 500 MG PO CAPS: 500.0000 mg | ORAL_CAPSULE | Freq: Three times a day (TID) | ORAL | 2 refills | Status: DC

## 2019-01-05 NOTE — Assessment & Plan Note (Signed)
Does have 3+ leuks--but not enough for culture 2/20---culture showed no growth and several years before multiple organisms Will use cephalexin (less broad the cefuroxime) She can use 3 day Rx prn if recurs in some time

## 2019-01-05 NOTE — Patient Instructions (Signed)
Please start the antibiotic and take 2 or 3 today. If your symptoms are gone tomorrow, you can stop the antibiotic after 3 days

## 2019-01-05 NOTE — Progress Notes (Signed)
Subjective:    Patient ID: Amanda [name], female    DOB: July 16, 1919, 85 y.o.   MRN: [id number]  HPI Here with daughter  Started with burning dysuria yesterday Last seen for this in [facility] with the antibiotic Has used left over antibiotics x 2 and symptoms will clear (?every 6 weeks or so)  No fever No abdominal pain No hematuria  Current Outpatient Medications on File Prior to Visit  Medication Sig Dispense Refill  . acetaminophen (TYLENOL) 500 MG tablet Take 500 mg by mouth every 8 (eight) hours as needed for mild pain.     . cholecalciferol (VITAMIN D) 1000 UNITS tablet Take 1,000 Units by mouth daily.    Marland Kitchen diltiazem (CARDIZEM CD) 120 MG 24 hr capsule TAKE 1 CAPSULE BY MOUTH  DAILY 90 capsule 3  . hydrochlorothiazide (HYDRODIURIL) 25 MG tablet Take 1 tablet (25 mg total) by mouth as needed. 90 tablet 0  . levothyroxine (SYNTHROID, LEVOTHROID) 75 MCG tablet TAKE 1 TABLET BY MOUTH  DAILY 90 tablet 3  . mirtazapine (REMERON) 15 MG tablet TAKE 1 TABLET BY MOUTH AT  BEDTIME 90 tablet 3  . nitroGLYCERIN (NITROSTAT) 0.4 MG SL tablet Place 0.4 mg under the tongue every 5 (five) minutes as needed for chest pain (x 3 doses).     . sotalol (BETAPACE) 120 MG tablet TAKE ONE-HALF TABLET BY  MOUTH TWICE A DAY 90 tablet 3  . traMADol (ULTRAM) 50 MG tablet TAKE 1/2 TO 1 TABLET BY MOUTH 3 TIMES A DAY AS NEEDED FOR PAIN 90 tablet 0  . trimethoprim (TRIMPEX) 100 MG tablet TAKE 1 TABLET BY MOUTH  DAILY 90 tablet 3  . warfarin (COUMADIN) 1 MG tablet TAKE 1 TABLET BY MOUTH  DAILY AS DIRECTED BY  ANTICOAGULATION CLINIC 90 tablet 1  . warfarin (COUMADIN) 4 MG tablet TAKE 1 TABLET BY MOUTH  DAILY OR AS DIRECTED BY  COUMADIN CLINIC 90 tablet 1   No current facility-administered medications on file prior to visit.     Allergies  Allergen Reactions  . Amoxicillin Diarrhea    Weakness and atrial fib  . Augmentin [Amoxicillin-Pot Clavulanate] Diarrhea    Weakness and atrial fib  . Sulfonamide  Derivatives     unknown    Past Medical History:  Diagnosis Date  . Atrial fibrillation ([place])   . Depression   . GERD (gastroesophageal reflux disease)   . Hyperlipidemia   . Hypertension   . Hypothyroidism    after Rx for thyroid cancer  . Mitral regurgitation   . Osteoarthritis   . Pacemaker   . Sleep disturbance   . Urinary incontinence    usually from UTI's    Past Surgical History:  Procedure Laterality Date  . BREAST BIOPSY  2002  . BREAST LUMPECTOMY  1983  . CATARACT EXTRACTION, BILATERAL    . EP IMPLANTABLE DEVICE N/A 09/02/2015   Procedure: PPM Generator Changeout;  Surgeon: Evans Lance, MD;  Location: [facility];  Service: Cardiovascular;  Laterality: N/A;  . Goiter Removed  1976   cancerous. Then got RAI  . PACEMAKER INSERTION  5/07   after syncope  . TONSILLECTOMY AND ADENOIDECTOMY  1937  . VESICOVAGINAL FISTULA CLOSURE W/ TAH  1972    Family History  Problem Relation Age of Onset  . Diabetes Mother   . Stroke Sister   . Coronary artery disease Brother   . Prostate cancer Brother   . Breast cancer Neg Hx   . Colon  cancer Neg Hx     Social History   Socioeconomic History  . Marital status: Widowed    Spouse name: Not on file  . Number of children: 2  . Years of education: Not on file  . Highest education level: Not on file  Occupational History  . Occupation: homemaker  Social Needs  . Financial resource strain: Not on file  . Food insecurity    Worry: Not on file    Inability: Not on file  . Transportation needs    Medical: Not on file    Non-medical: Not on file  Tobacco Use  . Smoking status: Never Smoker  . Smokeless tobacco: Never Used  Substance and Sexual Activity  . Alcohol use: No  . Drug use: No  . Sexual activity: Not on file  Lifestyle  . Physical activity    Days per week: Not on file    Minutes per session: Not on file  . Stress: Not on file  Relationships  . Social Herbalist on phone: Not on  file    Gets together: Not on file    Attends religious service: Not on file    Active member of club or organization: Not on file    Attends meetings of clubs or organizations: Not on file    Relationship status: Not on file  . Intimate partner violence    Fear of current or ex partner: Not on file    Emotionally abused: Not on file    Physically abused: Not on file    Forced sexual activity: Not on file  Other Topics Concern  . Not on file  Social History Narrative   Widowed 2002. 1 local daughter. 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 Was homemaker. Farmed and gardened.    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    Daughter [name]) is health care POA.    Discussed DNR and she requests    Would not want tube feeds if cognitively unaware   Review of Systems No N/V Slight shivering feeling No back pain    Objective:   Physical Exam  Constitutional: No distress.  GI: Soft. She exhibits no distension. There is no abdominal tenderness. There is no rebound and no guarding.           Assessment & Plan:

## 2019-01-25 ENCOUNTER — Ambulatory Visit (INDEPENDENT_AMBULATORY_CARE_PROVIDER_SITE_OTHER): Payer: Medicare Other | Admitting: General Practice

## 2019-01-25 ENCOUNTER — Other Ambulatory Visit: Payer: Self-pay

## 2019-01-25 DIAGNOSIS — Z7901 Long term (current) use of anticoagulants: Secondary | ICD-10-CM | POA: Diagnosis not present

## 2019-01-25 DIAGNOSIS — I480 Paroxysmal atrial fibrillation: Secondary | ICD-10-CM

## 2019-01-25 LAB — POCT INR: INR: 1.9 — AB (ref 2.0–3.0)

## 2019-01-25 NOTE — Patient Instructions (Signed)
Pre visit review using our clinic review tool, if applicable. No additional management support is needed unless otherwise documented below in the visit note.  Take 5 mg today (10/8) and then continue to take 3 mg daily and re-check in 4 weeks.

## 2019-01-26 DIAGNOSIS — S51801A Unspecified open wound of right forearm, initial encounter: Secondary | ICD-10-CM | POA: Diagnosis not present

## 2019-02-04 NOTE — Progress Notes (Signed)
[name] ID: Amanda [name], female   DOB: February 15, 1920, 85 y.o.   MRN: [id number] Cardiology Office Note  Date:  02/06/2019   ID:  Amanda [name], DOB December 24, 1919, MRN [id number]  PCP:  Venia Carbon, MD   Chief Complaint  [name] presents with  . Other    past due follow up. [name] denies chest pain and SOB at this time. Meds reviewed verbally with [name].     HPI:  Amanda [name] is a pleasant 85 year old woman who has a history of paroxysmal atrial fibrillation,  normal systolic function by echocardiogram in 2007   Holter monitor and April 2007 showing frequent and complex supraventricular arrhythmia,  cardiac CTA showing no significant coronary artery disease with atherosclerotic plaque in Amanda thoracic aorta  managed on sotalol for rhythm control,  pacer placed in May 2007  history of "fainting "prior to that  who presents for routine followup of her atrial fibrillation  Presents with her daughter on today's visit Feeble, tired, Hard of hearing At [place] , does not get out much No falls, one accident, trauma to her arm , went to urgent care  GI issues ,comes and goes, constipation, periodic diarrhea On prunes  On HCTZ daily On last clinic visit was only taking HCTZ as needed for ankle swelling Appears she made Amanda change on her own  Used to have  tingling and burning in her lower extremities, better  Takes tramadol at nighttime to go to sleep   pacemaker change out May 2017 for ERI  EKG on today's visit shows atrial paced rhythm with rate 63 bpm, nonspecific ST abnormality  Other past medical history several previous falls  Previous echocardiogram for shortness of breath echo showed mild LVH, diastolic relaxation abnormality, normal ejection fraction, mild to moderate MR, high normal right ventricular systolic pressures Cardiac catheterization in April 2007 showing no significant coronary artery disease  PMH:   has a past medical history of Atrial fibrillation  ([place]), Depression, GERD (gastroesophageal reflux disease), Hyperlipidemia, Hypertension, Hypothyroidism, Mitral regurgitation, Osteoarthritis, Pacemaker, Sleep disturbance, and Urinary incontinence.  PSH:    Past Surgical History:  Procedure Laterality Date  . BREAST BIOPSY  2002  . BREAST LUMPECTOMY  1983  . CATARACT EXTRACTION, BILATERAL    . EP IMPLANTABLE DEVICE N/A 09/02/2015   Procedure: PPM Generator Changeout;  Surgeon: Evans Lance, MD;  Location: [facility];  Service: Cardiovascular;  Laterality: N/A;  . Goiter Removed  1976   cancerous. Then got RAI  . PACEMAKER INSERTION  5/07   after syncope  . TONSILLECTOMY AND ADENOIDECTOMY  1937  . VESICOVAGINAL FISTULA CLOSURE W/ TAH  1972    Current Outpatient Medications  Medication Sig Dispense Refill  . acetaminophen (TYLENOL) 500 MG tablet Take 500 mg by mouth every 8 (eight) hours as needed for mild pain.     . cephALEXin (KEFLEX) 500 MG capsule Take 1 capsule (500 mg total) by mouth 3 (three) times daily. 20 capsule 2  . cholecalciferol (VITAMIN D) 1000 UNITS tablet Take 1,000 Units by mouth daily.    Marland Kitchen diltiazem (CARDIZEM CD) 120 MG 24 hr capsule TAKE 1 CAPSULE BY MOUTH  DAILY 90 capsule 3  . hydrochlorothiazide (HYDRODIURIL) 25 MG tablet Take 1 tablet (25 mg total) by mouth as needed. 90 tablet 0  . levothyroxine (SYNTHROID, LEVOTHROID) 75 MCG tablet TAKE 1 TABLET BY MOUTH  DAILY 90 tablet 3  . mirtazapine (REMERON) 15 MG tablet TAKE 1 TABLET BY MOUTH AT  BEDTIME  90 tablet 3  . nitroGLYCERIN (NITROSTAT) 0.4 MG SL tablet Place 0.4 mg under Amanda tongue every 5 (five) minutes as needed for chest pain (x 3 doses).     . sotalol (BETAPACE) 120 MG tablet TAKE ONE-HALF TABLET BY  MOUTH TWICE A DAY 90 tablet 3  . traMADol (ULTRAM) 50 MG tablet TAKE 1/2 TO 1 TABLET BY MOUTH 3 TIMES A DAY AS NEEDED FOR PAIN 90 tablet 0  . trimethoprim (TRIMPEX) 100 MG tablet TAKE 1 TABLET BY MOUTH  DAILY 90 tablet 3  . warfarin (COUMADIN) 1 MG  tablet TAKE 1 TABLET BY MOUTH  DAILY AS DIRECTED BY  ANTICOAGULATION CLINIC 90 tablet 1  . warfarin (COUMADIN) 4 MG tablet TAKE 1 TABLET BY MOUTH  DAILY OR AS DIRECTED BY  COUMADIN CLINIC 90 tablet 1   No current facility-administered medications for this visit.      Allergies:   Amoxicillin, Augmentin [amoxicillin-pot clavulanate], and Sulfonamide derivatives   Social History:  Amanda [name]  reports that she has never smoked. She has never used smokeless tobacco. She reports that she does not drink alcohol or use drugs.   Family History:   family history includes Coronary artery disease in her brother; Diabetes in her mother; Prostate cancer in her brother; Stroke in her sister.    Review of Systems: Review of Systems  Constitutional: Negative.   HENT: Negative.   Respiratory: Negative.   Cardiovascular: Negative.   Gastrointestinal: Negative.   Musculoskeletal: Negative.   Neurological: Positive for weakness.  Psychiatric/Behavioral: Negative.   All other systems reviewed and are negative.   PHYSICAL EXAM: VS:  BP (!) 122/50 (BP Location: Left Arm, [name] Position: Sitting, Cuff Size: Normal)   Pulse 63   Ht 5\' 2"  (1.575 m)   Wt 115 lb (52.2 kg)   BMI 21.03 kg/m  , BMI Body mass index is 21.03 kg/m. Constitutional:  oriented to person, place, and time. No distress.  Frail, presenting in a wheelchair HENT:  Head: Grossly normal Eyes:  no discharge. No scleral icterus.  Neck: No JVD, no carotid bruits  Cardiovascular: Regular rate and rhythm, no murmurs appreciated Pulmonary/Chest: Clear to auscultation bilaterally, no wheezes or rails Abdominal: Soft.  no distension.  no tenderness.  Musculoskeletal: Normal range of motion Neurological:  normal muscle tone. Coordination normal. No atrophy Skin: Skin warm and dry Psychiatric: normal affect, pleasant   Recent Labs: 02/14/2018: Hemoglobin 10.7; Platelets 208.0 12/26/2018: ALT 7; BUN 37; Creatinine, Ser 2.08; Potassium  4.5; Sodium 139    Lipid Panel Lab Results  Component Value Date   CHOL 391 (H) 06/22/2011   HDL 63.80 06/22/2011   TRIG 296.0 (H) 06/22/2011      Wt Readings from Last 3 Encounters:  02/06/19 115 lb (52.2 kg)  01/05/19 115 lb (52.2 kg)  12/26/18 115 lb 1.9 oz (52.2 kg)       ASSESSMENT AND PLAN:   Essential hypertension - Plan: EKG 27-OZDG, Basic Metabolic Panel (BMET) For unclear reasons she is taking HCTZ daily Past 2 years with worsening renal function creatinine greater than 2 She does not have ankle swelling on today's visit, recommend she wean down on HCTZ to every other day with effort to get HCTZ as needed for ankle swelling as she was taking before This was discussed with Amanda [name] and daughter  Paroxysmal atrial fibrillation (HCC) -  Normal sinus rhythm, paced, on anticoagulation continue sotalol and diltiazem Denies any tachycardia palpitations  Chronic diastolic CHF (congestive heart  failure) ([place]) Previous office visit had 7 pound weight loss, was not eating as much Weight stable We will try to wean down on HCTZ and take only as needed for ankle swelling  Chronic renal disease,  ([place]) Dramatic climbing creatinine seems to coincide with her taking HCTZ daily.  Will wean down on HCTZ  PPM-Medtronic Scheduled see Dr. Caryl Comes today  Diarrhea Symptoms much better, now with periodic constipation Takes prune juice  Tingling leg burning Symptoms better, likely neuropathy  Disposition:   F/U  12 months Will alternate with Dr. Caryl Comes   Orders Placed This Encounter  Procedures  . Flu Vaccine QUAD High Dose(Fluad)     Total encounter time more than 25 minutes  Greater than 50% was spent in counseling and coordination of care with Amanda [name]   Signed, Esmond Plants, M.D., Ph.D. 02/06/2019  [place], [place]

## 2019-02-06 ENCOUNTER — Other Ambulatory Visit: Payer: Self-pay

## 2019-02-06 ENCOUNTER — Ambulatory Visit (INDEPENDENT_AMBULATORY_CARE_PROVIDER_SITE_OTHER): Payer: Medicare Other | Admitting: Internal Medicine

## 2019-02-06 ENCOUNTER — Encounter: Payer: Self-pay | Admitting: Cardiovascular Disease

## 2019-02-06 ENCOUNTER — Ambulatory Visit (INDEPENDENT_AMBULATORY_CARE_PROVIDER_SITE_OTHER): Payer: Medicare Other | Admitting: Cardiovascular Disease

## 2019-02-06 VITALS — BP 122/50 | HR 63 | Ht 62.0 in | Wt 115.0 lb

## 2019-02-06 VITALS — BP 122/50

## 2019-02-06 DIAGNOSIS — E782 Mixed hyperlipidemia: Secondary | ICD-10-CM

## 2019-02-06 DIAGNOSIS — I495 Sick sinus syndrome: Secondary | ICD-10-CM

## 2019-02-06 DIAGNOSIS — Z950 Presence of cardiac pacemaker: Secondary | ICD-10-CM

## 2019-02-06 DIAGNOSIS — Z79899 Other long term (current) drug therapy: Secondary | ICD-10-CM

## 2019-02-06 DIAGNOSIS — I480 Paroxysmal atrial fibrillation: Secondary | ICD-10-CM

## 2019-02-06 DIAGNOSIS — Z23 Encounter for immunization: Secondary | ICD-10-CM | POA: Diagnosis not present

## 2019-02-06 DIAGNOSIS — I10 Essential (primary) hypertension: Secondary | ICD-10-CM

## 2019-02-06 NOTE — Progress Notes (Signed)
Patient Care Team: Venia Carbon, MD as PCP - General Rockey Situ Kathlene November, MD as Consulting Physician (Cardiology)   HPI  Amanda [name] is a 85 y.o. female seen in followup with a previously implanted pacemaker in 2007 for syncope and apparently tachybradycardia syndrome; she has a history of paroxysmal atrial fibrillation managed with sotalol. She underwent generator replacement 5/17   No palps; limited ambulation     Date Cr K Mg Hgb  6/17  1.76 4.3    4/18  1.28 4.1  10.3  1/19 2.15 4.0  10.8  9/20 2.08 4.5         Past Medical History:  Diagnosis Date  . Atrial fibrillation ([place])   . Depression   . GERD (gastroesophageal reflux disease)   . Hyperlipidemia   . Hypertension   . Hypothyroidism    after Rx for thyroid cancer  . Mitral regurgitation   . Osteoarthritis   . Pacemaker   . Sleep disturbance   . Urinary incontinence    usually from UTI's    Past Surgical History:  Procedure Laterality Date  . BREAST BIOPSY  2002  . BREAST LUMPECTOMY  1983  . CATARACT EXTRACTION, BILATERAL    . EP IMPLANTABLE DEVICE N/A 09/02/2015   Procedure: PPM Generator Changeout;  Surgeon: Evans Lance, MD;  Location: [facility];  Service: Cardiovascular;  Laterality: N/A;  . Goiter Removed  1976   cancerous. Then got RAI  . PACEMAKER INSERTION  5/07   after syncope  . TONSILLECTOMY AND ADENOIDECTOMY  1937  . VESICOVAGINAL FISTULA CLOSURE W/ TAH  1972    Current Outpatient Medications  Medication Sig Dispense Refill  . acetaminophen (TYLENOL) 500 MG tablet Take 500 mg by mouth every 8 (eight) hours as needed for mild pain.     . cephALEXin (KEFLEX) 500 MG capsule Take 1 capsule (500 mg total) by mouth 3 (three) times daily. 20 capsule 2  . cholecalciferol (VITAMIN D) 1000 UNITS tablet Take 1,000 Units by mouth daily.    Marland Kitchen diltiazem (CARDIZEM CD) 120 MG 24 hr capsule TAKE 1 CAPSULE BY MOUTH  DAILY 90 capsule 3  . hydrochlorothiazide (HYDRODIURIL) 25 MG  tablet Take 1 tablet (25 mg total) by mouth as needed. 90 tablet 0  . levothyroxine (SYNTHROID, LEVOTHROID) 75 MCG tablet TAKE 1 TABLET BY MOUTH  DAILY 90 tablet 3  . mirtazapine (REMERON) 15 MG tablet TAKE 1 TABLET BY MOUTH AT  BEDTIME 90 tablet 3  . nitroGLYCERIN (NITROSTAT) 0.4 MG SL tablet Place 0.4 mg under the tongue every 5 (five) minutes as needed for chest pain (x 3 doses).     . sotalol (BETAPACE) 120 MG tablet TAKE ONE-HALF TABLET BY  MOUTH TWICE A DAY 90 tablet 3  . traMADol (ULTRAM) 50 MG tablet TAKE 1/2 TO 1 TABLET BY MOUTH 3 TIMES A DAY AS NEEDED FOR PAIN 90 tablet 0  . trimethoprim (TRIMPEX) 100 MG tablet TAKE 1 TABLET BY MOUTH  DAILY 90 tablet 3  . warfarin (COUMADIN) 1 MG tablet TAKE 1 TABLET BY MOUTH  DAILY AS DIRECTED BY  ANTICOAGULATION CLINIC 90 tablet 1  . warfarin (COUMADIN) 4 MG tablet TAKE 1 TABLET BY MOUTH  DAILY OR AS DIRECTED BY  COUMADIN CLINIC 90 tablet 1   No current facility-administered medications for this visit.     Allergies  Allergen Reactions  . Amoxicillin Diarrhea    Weakness and atrial fib  . Augmentin [Amoxicillin-Pot  Clavulanate] Diarrhea    Weakness and atrial fib  . Sulfonamide Derivatives     unknown    Review of Systems negative except from HPI and PMH  Physical Exam BP (!) 122/50   HR 61 Well developed and well nourished in no acute distress HENTesotropia Neck   Clear Device pocket well healed; without hematoma or erythema.  There is no tethering  Regular rate and rhythm, no  murmur Abd-soft   No Clubbing cyanosis  edema Skin-warm and dry A & Oriented  Grossly normal sensory and motor function  ECG  apacing  @ 63 18/08/40 so she is going to be shortness she needs a MAC and then I did talk with her about her GFR she is 96 her GFR is going to have to be close to less than    Assessment and  Plan  Atrial fibrillation with some undersensing  Pacemaker-Medtronic The patient's device was interrogated.  The information was  reviewed. No changes were made in the programming.      High risk medication surveillance-sotalol  Renal insufficiency Gd 5  Hypertension   .No intercurrent atrial fibrillation   Her calculated GFR today is 12.5.  I reviewed with her and her daughter the contraindication that this is her sotalol.  We discussed reducing the dose or stopping it.  Per her daughter's perspective, it has done a great job of keeping her symptomatic palpitations a day and that at this point the quality of her life is improved and they understand the risks associated with ongoing use of the sotalol. We will check a magnesium suspect this should be okay given her renal function it has not been effective. \ We spent more than 50% of our >25 min visit in face to face counseling regarding the above

## 2019-02-06 NOTE — Patient Instructions (Signed)
Medication Instructions:  Decrease the HCTZ down to every other day Consider taking only as needed for significant ankle swelling   If you need a refill on your cardiac medications before your next appointment, please call your pharmacy.    Lab work: No new labs needed   If you have labs (blood work) drawn today and your tests are completely normal, you will receive your results only by: Marland Kitchen MyChart Message (if you have MyChart) OR . A paper copy in the mail If you have any lab test that is abnormal or we need to change your treatment, we will call you to review the results.   Testing/Procedures: No new testing needed   Follow-Up: At [facility], you and your health needs are our priority.  As part of our continuing mission to provide you with exceptional heart care, we have created designated Provider Care Teams.  These Care Teams include your primary Cardiologist (physician) and Advanced Practice Providers (APPs -  Physician Assistants and Nurse Practitioners) who all work together to provide you with the care you need, when you need it.  . You will need a follow up appointment in 12 months .   Please call our office 2 months in advance to schedule this appointment.    . Providers on your designated Care Team:   . Murray Hodgkins, NP . Christell Faith, PA-C . Marrianne Mood, PA-C  Any Other Special Instructions Will Be Listed Below (If Applicable).  For educational health videos Log in to : [email or web address] Or : [email or web address], password : [email or web address]

## 2019-02-06 NOTE — Patient Instructions (Addendum)
Medication Instructions:  - Your physician recommends that you continue on your current medications as directed. Please refer to the Current Medication list given to you today.  *If you need a refill on your cardiac medications before your next appointment, please call your pharmacy*  Lab Work: - Your physician recommends that you have lab work today: Magnesium  If you have labs (blood work) drawn today and your tests are completely normal, you will receive your results only by: Marland Kitchen MyChart Message (if you have MyChart) OR . A paper copy in the mail If you have any lab test that is abnormal or we need to change your treatment, we will call you to review the results.  Testing/Procedures: - none ordered  Follow-Up: At [facility], you and your health needs are our priority.  As part of our continuing mission to provide you with exceptional heart care, we have created designated Provider Care Teams.  These Care Teams include your primary Cardiologist (physician) and Advanced Practice Providers (APPs -  Physician Assistants and Nurse Practitioners) who all work together to provide you with the care you need, when you need it.  Your next appointment:   6 months (April 2021)  The format for your next appointment:   In Person  Provider:   Virl Axe, MD  Other Instructions - N/A

## 2019-02-07 LAB — MAGNESIUM: Magnesium: 1.9 mg/dL (ref 1.6–2.3)

## 2019-02-08 ENCOUNTER — Telehealth: Payer: Self-pay | Admitting: *Deleted

## 2019-02-08 NOTE — Telephone Encounter (Signed)
Spoke with patient's daughter, [name] ([place]). Advised transmission attempted on 02/06/19 was not received. [name] reports new [phone] monitor "just stopped" halfway through transmission. No error code. She declines to call tech services at this time. [name] will be able to visit the patient again next week, agrees to call the [facility] when she is there for another transmission attempt. Direct DC number given. No further questions at this time.

## 2019-02-23 ENCOUNTER — Ambulatory Visit (INDEPENDENT_AMBULATORY_CARE_PROVIDER_SITE_OTHER): Payer: Medicare Other | Admitting: *Deleted

## 2019-02-23 DIAGNOSIS — I480 Paroxysmal atrial fibrillation: Secondary | ICD-10-CM

## 2019-02-23 DIAGNOSIS — I495 Sick sinus syndrome: Secondary | ICD-10-CM

## 2019-02-24 LAB — CUP PACEART REMOTE DEVICE CHECK
Battery Impedance: 252 Ohm
Battery Remaining Longevity: 107 mo
Battery Voltage: 2.79 V
Brady Statistic AP VP Percent: 1 %
Brady Statistic AP VS Percent: 99 %
Brady Statistic AS VP Percent: 0 %
Brady Statistic AS VS Percent: 0 %
Date Time Interrogation Session: 20201106184001
Implantable Lead Implant Date: 20070524
Implantable Lead Implant Date: 20070524
Implantable Lead Location: 753859
Implantable Lead Location: 753860
Implantable Lead Model: 5076
Implantable Lead Model: 5568
Implantable Pulse Generator Implant Date: 20170516
Lead Channel Impedance Value: 475 Ohm
Lead Channel Impedance Value: 715 Ohm
Lead Channel Pacing Threshold Amplitude: 0.875 V
Lead Channel Pacing Threshold Amplitude: 1.125 V
Lead Channel Pacing Threshold Pulse Width: 0.4 ms
Lead Channel Pacing Threshold Pulse Width: 0.4 ms
Lead Channel Setting Pacing Amplitude: 2.25 V
Lead Channel Setting Pacing Amplitude: 2.5 V
Lead Channel Setting Pacing Pulse Width: 0.4 ms
Lead Channel Setting Sensing Sensitivity: 2 mV

## 2019-03-01 ENCOUNTER — Other Ambulatory Visit: Payer: Self-pay

## 2019-03-01 ENCOUNTER — Ambulatory Visit (INDEPENDENT_AMBULATORY_CARE_PROVIDER_SITE_OTHER): Payer: Medicare Other | Admitting: General Practice

## 2019-03-01 DIAGNOSIS — I480 Paroxysmal atrial fibrillation: Secondary | ICD-10-CM

## 2019-03-01 DIAGNOSIS — Z7901 Long term (current) use of anticoagulants: Secondary | ICD-10-CM | POA: Diagnosis not present

## 2019-03-01 LAB — POCT INR: INR: 2.4 (ref 2.0–3.0)

## 2019-03-01 NOTE — Patient Instructions (Addendum)
Pre visit review using our clinic review tool, if applicable. No additional management support is needed unless otherwise documented below in the visit note.  Continue to take 3 mg daily and re-check in 4 weeks.    

## 2019-03-02 ENCOUNTER — Other Ambulatory Visit: Payer: Self-pay | Admitting: Internal Medicine

## 2019-03-02 NOTE — Telephone Encounter (Signed)
This is a [place] pt 

## 2019-03-07 ENCOUNTER — Other Ambulatory Visit: Payer: Self-pay | Admitting: Internal Medicine

## 2019-03-08 NOTE — Telephone Encounter (Signed)
Last filled 12-14-18 #90 Last OV 12-26-18 Next OV 06-26-19 [facility]

## 2019-03-11 NOTE — Progress Notes (Signed)
Remote pacemaker transmission.   

## 2019-03-28 ENCOUNTER — Other Ambulatory Visit: Payer: Self-pay

## 2019-03-29 ENCOUNTER — Ambulatory Visit (INDEPENDENT_AMBULATORY_CARE_PROVIDER_SITE_OTHER): Payer: Medicare Other | Admitting: General Practice

## 2019-03-29 DIAGNOSIS — Z7901 Long term (current) use of anticoagulants: Secondary | ICD-10-CM | POA: Diagnosis not present

## 2019-03-29 DIAGNOSIS — I480 Paroxysmal atrial fibrillation: Secondary | ICD-10-CM

## 2019-03-29 LAB — POCT INR: INR: 3.1 — AB (ref 2.0–3.0)

## 2019-03-29 NOTE — Patient Instructions (Signed)
Pre visit review using our clinic review tool, if applicable. No additional management support is needed unless otherwise documented below in the visit note.  Skip dosage today and then continue to take 3 mg daily and re-check in 4 weeks.

## 2019-04-09 LAB — CUP PACEART INCLINIC DEVICE CHECK
Battery Impedance: 251 Ohm
Battery Remaining Longevity: 108 mo
Battery Voltage: 2.79 V
Brady Statistic AP VP Percent: 3 %
Brady Statistic AP VS Percent: 96 %
Brady Statistic AS VP Percent: 0 %
Brady Statistic AS VS Percent: 1 %
Date Time Interrogation Session: 20201020105200
Implantable Lead Implant Date: 20070524
Implantable Lead Implant Date: 20070524
Implantable Lead Location: 753859
Implantable Lead Location: 753860
Implantable Lead Model: 5076
Implantable Lead Model: 5568
Implantable Pulse Generator Implant Date: 20170516
Lead Channel Impedance Value: 459 Ohm
Lead Channel Impedance Value: 786 Ohm
Lead Channel Pacing Threshold Amplitude: 0.75 V
Lead Channel Pacing Threshold Amplitude: 1 V
Lead Channel Pacing Threshold Pulse Width: 0.4 ms
Lead Channel Pacing Threshold Pulse Width: 0.4 ms
Lead Channel Sensing Intrinsic Amplitude: 2 mV
Lead Channel Sensing Intrinsic Amplitude: 8 mV
Lead Channel Setting Pacing Amplitude: 2 V
Lead Channel Setting Pacing Amplitude: 2.5 V
Lead Channel Setting Pacing Pulse Width: 0.4 ms
Lead Channel Setting Sensing Sensitivity: 2.8 mV

## 2019-04-13 ENCOUNTER — Emergency Department
Admission: EM | Admit: 2019-04-13 | Discharge: 2019-04-13 | Disposition: A | Discharge status: Home / Self Care | Payer: Medicare Other | Attending: Emergency Medicine | Admitting: Emergency Medicine

## 2019-04-13 ENCOUNTER — Other Ambulatory Visit: Payer: Self-pay

## 2019-04-13 ENCOUNTER — Encounter: Payer: Self-pay | Admitting: Emergency Medicine

## 2019-04-13 DIAGNOSIS — I130 Hypertensive heart and chronic kidney disease with heart failure and stage 1 through stage 4 chronic kidney disease, or unspecified chronic kidney disease: Secondary | ICD-10-CM | POA: Insufficient documentation

## 2019-04-13 DIAGNOSIS — I5032 Chronic diastolic (congestive) heart failure: Secondary | ICD-10-CM | POA: Diagnosis not present

## 2019-04-13 DIAGNOSIS — Z7901 Long term (current) use of anticoagulants: Secondary | ICD-10-CM | POA: Insufficient documentation

## 2019-04-13 DIAGNOSIS — Z950 Presence of cardiac pacemaker: Secondary | ICD-10-CM | POA: Diagnosis not present

## 2019-04-13 DIAGNOSIS — R040 Epistaxis: Secondary | ICD-10-CM | POA: Insufficient documentation

## 2019-04-13 DIAGNOSIS — E039 Hypothyroidism, unspecified: Secondary | ICD-10-CM | POA: Insufficient documentation

## 2019-04-13 DIAGNOSIS — Z79899 Other long term (current) drug therapy: Secondary | ICD-10-CM | POA: Insufficient documentation

## 2019-04-13 DIAGNOSIS — N184 Chronic kidney disease, stage 4 (severe): Secondary | ICD-10-CM | POA: Diagnosis not present

## 2019-04-13 LAB — CBC WITH DIFFERENTIAL/PLATELET
Abs Immature Granulocytes: 0.07 10*3/uL (ref 0.00–0.07)
Basophils Absolute: 0.1 10*3/uL (ref 0.0–0.1)
Basophils Relative: 1 %
Eosinophils Absolute: 0.1 10*3/uL (ref 0.0–0.5)
Eosinophils Relative: 1 %
HCT: 31.2 % — ABNORMAL LOW (ref 36.0–46.0)
Hemoglobin: 10.7 g/dL — ABNORMAL LOW (ref 12.0–15.0)
Immature Granulocytes: 1 %
Lymphocytes Relative: 14 %
Lymphs Abs: 1.1 10*3/uL (ref 0.7–4.0)
MCH: 28.9 pg (ref 26.0–34.0)
MCHC: 34.3 g/dL (ref 30.0–36.0)
MCV: 84.3 fL (ref 80.0–100.0)
Monocytes Absolute: 0.7 10*3/uL (ref 0.1–1.0)
Monocytes Relative: 8 %
Neutro Abs: 6.3 10*3/uL (ref 1.7–7.7)
Neutrophils Relative %: 75 %
Platelets: 225 10*3/uL (ref 150–400)
RBC: 3.7 MIL/uL — ABNORMAL LOW (ref 3.87–5.11)
RDW: 14.2 % (ref 11.5–15.5)
WBC: 8.4 10*3/uL (ref 4.0–10.5)
nRBC: 0 % (ref 0.0–0.2)

## 2019-04-13 LAB — BASIC METABOLIC PANEL
Anion gap: 11 (ref 5–15)
BUN: 36 mg/dL — ABNORMAL HIGH (ref 8–23)
CO2: 27 mmol/L (ref 22–32)
Calcium: 9.5 mg/dL (ref 8.9–10.3)
Chloride: 98 mmol/L (ref 98–111)
Creatinine, Ser: 1.95 mg/dL — ABNORMAL HIGH (ref 0.44–1.00)
GFR calc Af Amer: 24 mL/min — ABNORMAL LOW (ref >60)
GFR calc non Af Amer: 21 mL/min — ABNORMAL LOW (ref >60)
Glucose, Bld: 122 mg/dL — ABNORMAL HIGH (ref 70–99)
Potassium: 4.4 mmol/L (ref 3.5–5.1)
Sodium: 136 mmol/L (ref 135–145)

## 2019-04-13 LAB — PROTIME-INR
INR: 2.3 — ABNORMAL HIGH (ref 0.8–1.2)
Prothrombin Time: 25.6 seconds — ABNORMAL HIGH (ref 11.4–15.2)

## 2019-04-13 MED ORDER — TRANEXAMIC ACID 1000 MG/10ML IV SOLN
500.0000 mg | Freq: Once | INTRAVENOUS | Status: DC
  Filled 2019-04-13: qty 10

## 2019-04-13 MED ORDER — OXYMETAZOLINE HCL 0.05 % NA SOLN
1.0000 | Freq: Once | NASAL | Status: AC
  Administered 2019-04-13: 1 via NASAL
  Filled 2019-04-13: qty 30

## 2019-04-13 NOTE — ED Provider Notes (Signed)
[facility] Emergency Department Provider Note  ____________________________________________   I have reviewed the triage vital signs and the nursing notes.   HISTORY  Chief Complaint Epistaxis   History limited by: Not Limited   HPI Amanda [name] is a 85 y.o. female who presents to the emergency department today because of concerns for nosebleed.  The patient states the nosebleed started this morning.  She thought it was coming from her left naris.  She denies any trauma to her nose.  Had a nosebleed roughly 12 years ago which required cauterization.  Patient is on warfarin. Denies any shortness of breath or weakness.  Records reviewed. Per medical record review patient has a history of atrial fibrillation on warfarin.  Past Medical History:  Diagnosis Date  . Atrial fibrillation ([place])   . Depression   . GERD (gastroesophageal reflux disease)   . Hyperlipidemia   . Hypertension   . Hypothyroidism    after Rx for thyroid cancer  . Mitral regurgitation   . Osteoarthritis   . Pacemaker   . Sleep disturbance   . Urinary incontinence    usually from UTI's    Patient Active Problem List   Diagnosis Date Noted  . Acute cystitis 01/05/2019  . Secondary hyperparathyroidism of renal origin ([place]) 02/14/2018  . Advance directive discussed with patient 08/16/2017  . Long term (current) use of anticoagulants 04/07/2017  . Inguinal hernia 12/21/2016  . Neuropathy 10/08/2016  . Pedal edema 09/14/2016  . Paroxysmal atrial fibrillation ([place]) 04/20/2016  . Anemia of chronic renal failure, stage 4 (severe) ([place]) 02/05/2015  . Recurrent cystitis 02/25/2014  . Routine general medical examination at a health care facility 07/16/2013  . Thoracic aorta atherosclerosis ([place]) 07/16/2013  . Encounter for therapeutic drug monitoring 06/11/2013  . Chronic diastolic CHF (congestive heart failure) ([place]) 05/29/2013  . Chronic renal disease, stage IV ([place]) 12/16/2011  .  GERD (gastroesophageal reflux disease)   . SICK SINUS/ TACHY-BRADY SYNDROME 12/15/2009  . PPM-Medtronic 12/15/2009  . Mitral valve disorder 12/08/2009  . Episodic mood disorder ([place]) 05/05/2009  . HEARING LOSS 04/25/2009  . Hypothyroidism 12/27/2008  . Hyperlipidemia 12/27/2008  . Essential hypertension 12/27/2008  . OSTEOARTHRITIS 12/27/2008  . RLS (restless legs syndrome) 12/27/2008  . URINARY INCONTINENCE 12/27/2008    Past Surgical History:  Procedure Laterality Date  . BREAST BIOPSY  2002  . BREAST LUMPECTOMY  1983  . CATARACT EXTRACTION, BILATERAL    . EP IMPLANTABLE DEVICE N/A 09/02/2015   Procedure: PPM Generator Changeout;  Surgeon: Evans Lance, MD;  Location: [facility];  Service: Cardiovascular;  Laterality: N/A;  . Goiter Removed  1976   cancerous. Then got RAI  . PACEMAKER INSERTION  5/07   after syncope  . TONSILLECTOMY AND ADENOIDECTOMY  1937  . VESICOVAGINAL FISTULA CLOSURE W/ TAH  1972    Prior to Admission medications   Medication Sig Start Date End Date Taking? Authorizing Provider  acetaminophen (TYLENOL) 500 MG tablet Take 500 mg by mouth every 8 (eight) hours as needed for mild pain.     [provider]  cephALEXin (KEFLEX) 500 MG capsule Take 1 capsule (500 mg total) by mouth 3 (three) times daily. 01/05/19   Venia Carbon, MD  cholecalciferol (VITAMIN D) 1000 UNITS tablet Take 1,000 Units by mouth daily.    [provider]  diltiazem (CARDIZEM CD) 120 MG 24 hr capsule TAKE 1 CAPSULE BY MOUTH  DAILY 03/05/19   Deboraha Sprang, MD  hydrochlorothiazide (HYDRODIURIL) 25 MG tablet Take 1 tablet (25 mg total) by mouth as needed. 05/22/18   Minna Merritts, MD  levothyroxine (SYNTHROID) 75 MCG tablet TAKE 1 TABLET BY MOUTH  DAILY 03/02/19   Viviana Simpler I, MD  mirtazapine (REMERON) 15 MG tablet TAKE 1 TABLET BY MOUTH AT  BEDTIME 05/01/18   Venia Carbon, MD  nitroGLYCERIN (NITROSTAT) 0.4 MG SL tablet Place 0.4 mg under the  tongue every 5 (five) minutes as needed for chest pain (x 3 doses).     [provider]  sotalol (BETAPACE) 120 MG tablet TAKE ONE-HALF TABLET BY  MOUTH TWICE A DAY 01/02/19   Viviana Simpler I, MD  traMADol (ULTRAM) 50 MG tablet TAKE 1/2 TO 1 TABLET BY MOUTH 3 TIMES A DAY AS NEEDED FOR PAIN 03/08/19   Venia Carbon, MD  trimethoprim (TRIMPEX) 100 MG tablet TAKE 1 TABLET BY MOUTH  DAILY 07/20/18   Viviana Simpler I, MD  warfarin (COUMADIN) 1 MG tablet TAKE 1 TABLET BY MOUTH  DAILY AS DIRECTED BY  ANTICOAGULATION CLINIC 07/21/18   Venia Carbon, MD  warfarin (COUMADIN) 4 MG tablet TAKE 1 TABLET BY MOUTH  DAILY OR AS DIRECTED BY  COUMADIN CLINIC 07/21/18   Venia Carbon, MD    Allergies Amoxicillin, Augmentin [amoxicillin-pot clavulanate], and Sulfonamide derivatives  Family History  Problem Relation Age of Onset  . Diabetes Mother   . Stroke Sister   . Coronary artery disease Brother   . Prostate cancer Brother   . Breast cancer Neg Hx   . Colon cancer Neg Hx     Social History Social History   Tobacco Use  . Smoking status: Never Smoker  . Smokeless tobacco: Never Used  Substance Use Topics  . Alcohol use: No  . Drug use: No    Review of Systems Constitutional: No fever/chills Eyes: No visual changes. ENT: Positive for nose bleed.  Cardiovascular: Denies chest pain. Respiratory: Denies shortness of breath. Gastrointestinal: No abdominal pain.  No nausea, no vomiting.  No diarrhea.   Genitourinary: Negative for dysuria. Musculoskeletal: Negative for back pain. Skin: Negative for rash. Neurological: Negative for headaches, focal weakness or numbness.  ____________________________________________   PHYSICAL EXAM:  VITAL SIGNS: ED Triage Vitals  Enc Vitals Group     BP 04/13/19 0756 (!) 156/73     Pulse Rate 04/13/19 0756 62     Resp 04/13/19 0756 18     Temp 04/13/19 0756 98.2 F (36.8 C)     Temp Source 04/13/19 0756 Oral     SpO2 04/13/19 0756 98  %     Weight 04/13/19 0745 116 lb (52.6 kg)     Height 04/13/19 0745 5\' 2"  (1.575 m)     Head Circumference --      Peak Flow --      Pain Score 04/13/19 0745 0   Constitutional: Alert and oriented.  Eyes: Conjunctivae are normal.  ENT      Head: Normocephalic and atraumatic.      Nose: Bleeding from left nares.       Mouth/Throat: Mucous membranes are moist.      Neck: No stridor. Hematological/Lymphatic/Immunilogical: No cervical lymphadenopathy. Cardiovascular: Normal rate, irregular rhythm.  No murmurs, rubs, or gallops. Respiratory: Normal respiratory effort without tachypnea nor retractions. Breath sounds are clear and equal bilaterally. No wheezes/rales/rhonchi. Gastrointestinal: Soft and non tender. No rebound. No guarding.  Genitourinary: Deferred Musculoskeletal: Normal range of motion in all extremities. No lower  extremity edema. Neurologic:  Normal speech and language. No gross focal neurologic deficits are appreciated.  Skin:  Skin is warm, dry and intact. No rash noted. Psychiatric: Mood and affect are normal. Speech and behavior are normal. Patient exhibits appropriate insight and judgment.  ____________________________________________    LABS (pertinent positives/negatives)  INR 2.3 CBC wbc 8.4, hgb 10.7, plt 225 BMP na 136, k 4.4, glu 122, cr 1.95  ____________________________________________   EKG  None  ____________________________________________    RADIOLOGY  None  ____________________________________________   PROCEDURES  Procedures  ____________________________________________   INITIAL IMPRESSION / ASSESSMENT AND PLAN / ED COURSE  Pertinent labs & imaging results that were available during my care of the patient were reviewed by me and considered in my medical decision making (see chart for details).   Patient presented to the emergency department today because of concern for nosebleed. Patient is on warfarin. Patient denies any  trauma. Afrin was used and nose was clamped. After unclamping patient was observed for 30 minutes without any further bleeding. At this time do think it is reasonable for patient to be discharged home. Discussed what to do if bleeding came back and return precautions.   ____________________________________________   FINAL CLINICAL IMPRESSION(S) / ED DIAGNOSES  Final diagnoses:  Epistaxis     Note: This dictation was prepared with Dragon dictation. Any transcriptional errors that result from this process are unintentional     Nance Pear, MD 04/13/19 1124

## 2019-04-13 NOTE — Discharge Instructions (Addendum)
As we discussed please use petroleum jelly (eg vasoline) to help keep your nostrils moist. If you start bleeding again please spray the medication in both nostrils and clamp for 30 minutes. If you continue to bleed you will need to return to the emergency department. Please seek medical attention for any high fevers, chest pain, shortness of breath, change in behavior, persistent vomiting, bloody stool or any other new or concerning symptoms.

## 2019-04-13 NOTE — ED Notes (Signed)
Pt continues to have no bleeding noted at this time. Pt visualized in NAD at this time. Nasal clamp placed in patient's purse per her request.

## 2019-04-13 NOTE — ED Notes (Signed)
NAD noted at time of D/C. Pt taken to lobby via wheelchair. Pt provided with nasal clamp and afrin per MD instructions. This RN reviewed D/C instructions with patient's daughter [name] with patient permission. Pt visualized in NAD upon discharge.

## 2019-04-13 NOTE — ED Triage Notes (Signed)
Pt presents to ED via [facility] from [facility] with c/o epistaxis x 1 hr. Per EMS pt had witnessed "stumble" but no injury. Per EMS pt was getting ready when she began having a nose bleed. Per EMS pt is currently on coumadin.   155/71 51 A-fib 98% RA

## 2019-04-13 NOTE — ED Notes (Signed)
[name]  Daughter in parking lot

## 2019-04-13 NOTE — ED Notes (Signed)
Nasal clamp applied by this RN.

## 2019-04-13 NOTE — ED Notes (Signed)
Pt provided with 2 warm blankets, bleeding noted to be controlled with nasal clamp at this time.

## 2019-04-20 ENCOUNTER — Other Ambulatory Visit: Payer: Self-pay | Admitting: Cardiovascular Disease

## 2019-04-20 ENCOUNTER — Other Ambulatory Visit: Payer: Self-pay | Admitting: Internal Medicine

## 2019-04-26 ENCOUNTER — Other Ambulatory Visit: Payer: Self-pay

## 2019-04-26 ENCOUNTER — Ambulatory Visit (INDEPENDENT_AMBULATORY_CARE_PROVIDER_SITE_OTHER): Payer: Medicare Other

## 2019-04-26 DIAGNOSIS — Z7901 Long term (current) use of anticoagulants: Secondary | ICD-10-CM

## 2019-04-26 LAB — POCT INR: INR: 1.9 — AB (ref 2.0–3.0)

## 2019-04-26 NOTE — Patient Instructions (Addendum)
Pre visit review using our clinic review tool, if applicable. No additional management support is needed unless otherwise documented below in the visit note.  Take 5 mg today and then continue to take 3 mg daily and re-check in 4 weeks.

## 2019-05-04 DIAGNOSIS — Z23 Encounter for immunization: Secondary | ICD-10-CM | POA: Diagnosis not present

## 2019-05-23 ENCOUNTER — Telehealth: Payer: Self-pay

## 2019-05-23 NOTE — Telephone Encounter (Signed)
Pt has coumadin clinic apt tomorrow. She needs to be screened for covid.  LVM for pt's daughter

## 2019-05-24 ENCOUNTER — Other Ambulatory Visit: Payer: Self-pay | Admitting: Internal Medicine

## 2019-05-24 ENCOUNTER — Ambulatory Visit (INDEPENDENT_AMBULATORY_CARE_PROVIDER_SITE_OTHER): Payer: Medicare Other

## 2019-05-24 ENCOUNTER — Other Ambulatory Visit: Payer: Self-pay

## 2019-05-24 DIAGNOSIS — Z7901 Long term (current) use of anticoagulants: Secondary | ICD-10-CM | POA: Diagnosis not present

## 2019-05-24 DIAGNOSIS — H6123 Impacted cerumen, bilateral: Secondary | ICD-10-CM | POA: Diagnosis not present

## 2019-05-24 DIAGNOSIS — R040 Epistaxis: Secondary | ICD-10-CM | POA: Diagnosis not present

## 2019-05-24 DIAGNOSIS — H903 Sensorineural hearing loss, bilateral: Secondary | ICD-10-CM | POA: Diagnosis not present

## 2019-05-24 LAB — POCT INR: INR: 2.2 (ref 2.0–3.0)

## 2019-05-24 NOTE — Patient Instructions (Addendum)
Pre visit review using our clinic review tool, if applicable. No additional management support is needed unless otherwise documented below in the visit note.  Continue to take 3 mg daily and re-check in 4 weeks.    

## 2019-05-24 NOTE — Telephone Encounter (Signed)
Pt as in today for coumadin clinic and last OV was 01/05/19. Pt has been compliant with coumadin management. Sent in script

## 2019-06-01 ENCOUNTER — Ambulatory Visit (INDEPENDENT_AMBULATORY_CARE_PROVIDER_SITE_OTHER): Payer: Medicare Other | Admitting: *Deleted

## 2019-06-01 DIAGNOSIS — I480 Paroxysmal atrial fibrillation: Secondary | ICD-10-CM

## 2019-06-01 DIAGNOSIS — Z23 Encounter for immunization: Secondary | ICD-10-CM | POA: Diagnosis not present

## 2019-06-01 LAB — CUP PACEART REMOTE DEVICE CHECK
Battery Impedance: 276 Ohm
Battery Remaining Longevity: 105 mo
Battery Voltage: 2.79 V
Brady Statistic AP VP Percent: 1 %
Brady Statistic AP VS Percent: 98 %
Brady Statistic AS VP Percent: 0 %
Brady Statistic AS VS Percent: 1 %
Date Time Interrogation Session: 20210212092827
Implantable Lead Implant Date: 20070524
Implantable Lead Implant Date: 20070524
Implantable Lead Location: 753859
Implantable Lead Location: 753860
Implantable Lead Model: 5076
Implantable Lead Model: 5568
Implantable Pulse Generator Implant Date: 20170516
Lead Channel Impedance Value: 485 Ohm
Lead Channel Impedance Value: 716 Ohm
Lead Channel Pacing Threshold Amplitude: 0.875 V
Lead Channel Pacing Threshold Amplitude: 0.875 V
Lead Channel Pacing Threshold Pulse Width: 0.4 ms
Lead Channel Pacing Threshold Pulse Width: 0.4 ms
Lead Channel Setting Pacing Amplitude: 2 V
Lead Channel Setting Pacing Amplitude: 2.5 V
Lead Channel Setting Pacing Pulse Width: 0.4 ms
Lead Channel Setting Sensing Sensitivity: 2.8 mV

## 2019-06-01 NOTE — Progress Notes (Signed)
PPM Remote  

## 2019-06-05 ENCOUNTER — Other Ambulatory Visit: Payer: Self-pay | Admitting: Internal Medicine

## 2019-06-05 NOTE — Telephone Encounter (Signed)
Last filled 03-08-19 #90 Last OV 01-05-19 Next OV 06-26-19 [facility]

## 2019-06-21 ENCOUNTER — Other Ambulatory Visit: Payer: Self-pay

## 2019-06-21 ENCOUNTER — Ambulatory Visit (INDEPENDENT_AMBULATORY_CARE_PROVIDER_SITE_OTHER): Payer: Medicare Other

## 2019-06-21 DIAGNOSIS — Z7901 Long term (current) use of anticoagulants: Secondary | ICD-10-CM | POA: Diagnosis not present

## 2019-06-21 LAB — POCT INR: INR: 2.1 (ref 2.0–3.0)

## 2019-06-21 NOTE — Patient Instructions (Addendum)
Pre visit review using our clinic review tool, if applicable. No additional management support is needed unless otherwise documented below in the visit note.  Continue to take 3 mg daily and re-check in 4 weeks.    

## 2019-06-26 ENCOUNTER — Encounter: Payer: Self-pay | Admitting: Internal Medicine

## 2019-06-26 ENCOUNTER — Other Ambulatory Visit: Payer: Self-pay

## 2019-06-26 ENCOUNTER — Ambulatory Visit (INDEPENDENT_AMBULATORY_CARE_PROVIDER_SITE_OTHER): Payer: Medicare Other | Admitting: Internal Medicine

## 2019-06-26 VITALS — BP 122/64 | HR 79 | Temp 98.3°F | Ht 62.0 in | Wt 112.1 lb

## 2019-06-26 DIAGNOSIS — N184 Chronic kidney disease, stage 4 (severe): Secondary | ICD-10-CM | POA: Diagnosis not present

## 2019-06-26 DIAGNOSIS — I480 Paroxysmal atrial fibrillation: Secondary | ICD-10-CM

## 2019-06-26 DIAGNOSIS — I5032 Chronic diastolic (congestive) heart failure: Secondary | ICD-10-CM

## 2019-06-26 DIAGNOSIS — I700 Atherosclerosis of aorta: Secondary | ICD-10-CM | POA: Diagnosis not present

## 2019-06-26 DIAGNOSIS — F39 Unspecified mood [affective] disorder: Secondary | ICD-10-CM

## 2019-06-26 DIAGNOSIS — N2581 Secondary hyperparathyroidism of renal origin: Secondary | ICD-10-CM

## 2019-06-26 NOTE — Assessment & Plan Note (Signed)
Seems to be compensated No changes in meds needed

## 2019-06-26 NOTE — Assessment & Plan Note (Addendum)
I suspect this is worse due to COVID restrictions Discussed going out some more with care since she has had her vaccines Need to add ensure daily to maintain nutrition Continues on the mirtazapine

## 2019-06-26 NOTE — Assessment & Plan Note (Signed)
Known on imaging Is on coumadin anyway

## 2019-06-26 NOTE — Assessment & Plan Note (Signed)
Continues on vitamin D Will recheck labs next visit

## 2019-06-26 NOTE — Assessment & Plan Note (Signed)
GFR stable at 21 on recent labs No Rx needed

## 2019-06-26 NOTE — Progress Notes (Signed)
Subjective:    Patient ID: Amanda [name], female    DOB: [birth year], 86 y.o.   MRN: [id number]  HPI Here for follow up of multiple medical problems---with daughter This visit occurred during the SARS-CoV-2 public health emergency.  Safety protocols were in place, including screening questions prior to the visit, additional usage of staff PPE, and extensive cleaning of exam room while observing appropriate contact time as indicated for disinfecting solutions.   Still lives in her own home Daughter is there regularly--will rarely stay over if she isn't feeling well Still does most of her housework--daughter still just vacuums Not as energetic--"I am feeling my age"  No chest pain Does feel her heart go fast more often now Cardiology monitors atrial fib Known aortic atherosclerosis as well on imaging Some edema by evening No SOB  Episodic depressed mood Will go to sleep and it generally will pass Daughter notes more talking like "I am not going to be here long"  Last GFR done at ER visit for nosebleed 12/25 Did go to ENT after this GFR 21 No uremic symptoms  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 acetaminophen (TYLENOL) 500 MG tablet Take 500 mg by mouth every 8 (eight) hours as needed for mild pain.     . cholecalciferol (VITAMIN D) 1000 UNITS tablet Take 1,000 Units by mouth daily.    Marland Kitchen diltiazem (CARDIZEM CD) 120 MG 24 hr capsule TAKE 1 CAPSULE BY MOUTH  DAILY 90 capsule 1  . hydrochlorothiazide (HYDRODIURIL) 25 MG tablet TAKE 1 TABLET BY MOUTH  DAILY AS NEEDED 90 tablet 0  . levothyroxine (SYNTHROID) 75 MCG tablet TAKE 1 TABLET BY MOUTH  DAILY 90 tablet 3  . mirtazapine (REMERON) 15 MG tablet TAKE 1 TABLET BY MOUTH AT  BEDTIME 90 tablet 0  . nitroGLYCERIN (NITROSTAT)  0.4 MG SL tablet Place 0.4 mg under the tongue every 5 (five) minutes as needed for chest pain (x 3 doses).     . sotalol (BETAPACE) 120 MG tablet TAKE ONE-HALF TABLET BY  MOUTH TWICE A DAY 90 tablet 3  . traMADol (ULTRAM) 50 MG tablet TAKE 1/2 TO 1 TABLET BY MOUTH 3 TIMES A DAY AS NEEDED FOR PAIN 90 tablet 0  . trimethoprim (TRIMPEX) 100 MG tablet TAKE 1 TABLET BY MOUTH  DAILY 90 tablet 3  . warfarin (COUMADIN) 1 MG tablet TAKE 1 TABLET BY MOUTH  DAILY AS DIRECTED BY  ANTICOAGULATION CLINIC 90 tablet 1  . warfarin (COUMADIN) 4 MG tablet TAKE 1 TABLET BY MOUTH  DAILY OR AS DIRECTED BY  COUMADIN CLINIC 90 tablet 1  . cephALEXin (KEFLEX) 500 MG capsule Take 1 capsule (500 mg total) by mouth 3 (three) times daily. (Patient not taking: Reported on 06/26/2019) 20 capsule 2   No current facility-administered medications on file prior to visit.    Allergies  Allergen Reactions  . Amoxicillin Diarrhea    Weakness and atrial fib  . Augmentin [Amoxicillin-Pot Clavulanate] Diarrhea    Weakness and atrial fib  . Sulfonamide Derivatives     unknown    Past Medical History:  Diagnosis Date  . Atrial fibrillation ([place])   . Depression   . GERD (gastroesophageal reflux disease)   . Hyperlipidemia   . Hypertension   .  Hypothyroidism    after Rx for thyroid cancer  . Mitral regurgitation   . Osteoarthritis   . Pacemaker   . Sleep disturbance   . Urinary incontinence    usually from UTI's    Past Surgical History:  Procedure Laterality Date  . BREAST BIOPSY  2002  . BREAST LUMPECTOMY  1983  . CATARACT EXTRACTION, BILATERAL    . EP IMPLANTABLE DEVICE N/A 09/02/2015   Procedure: PPM Generator Changeout;  Surgeon: Evans Lance, MD;  Location: [facility];  Service: Cardiovascular;  Laterality: N/A;  . Goiter Removed  1976   cancerous. Then got RAI  . PACEMAKER INSERTION  5/07   after syncope  . TONSILLECTOMY AND ADENOIDECTOMY  1937  . VESICOVAGINAL FISTULA CLOSURE W/ TAH  1972     Family History  Problem Relation Age of Onset  . Diabetes Mother   . Stroke Sister   . Coronary artery disease Brother   . Prostate cancer Brother   . Breast cancer Neg Hx   . Colon cancer Neg Hx     Social History   Socioeconomic History  . Marital status: Widowed    Spouse name: Not on file  . Number of children: 2  . Years of education: Not on file  . Highest education level: Not on file  Occupational History  . Occupation: homemaker  Tobacco Use  . Smoking status: Never Smoker  . Smokeless tobacco: Never Used  Substance and Sexual Activity  . Alcohol use: No  . Drug use: No  . Sexual activity: Not on file  Other Topics Concern  . Not on file  Social History Narrative   Widowed 2002. 1 local daughter. Other in [place]. Was homemaker. Farmed and gardened.    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    Daughter [name]) is health care POA.    Discussed DNR and she requests    Would not want tube feeds if cognitively unaware   Social Determinants of Health   Financial Resource Strain:   . Difficulty of Paying Living Expenses: Not on file  Food Insecurity:   . Worried About Charity fundraiser in the Last Year: Not on file  . Ran Out of Food in the Last Year: Not on file  Transportation Needs:   . Lack of Transportation (Medical): Not on file  . Lack of Transportation (Non-Medical): Not on file  Physical Activity:   . Days of Exercise per Week: Not on file  . Minutes of Exercise per Session: Not on file  Stress:   . Feeling of Stress : Not on file  Social Connections:   . Frequency of Communication with Friends and Family: Not on file  . Frequency of Social Gatherings with Friends and Family: Not on file  . Attends Religious Services: Not on file  . Active Member of Clubs or Organizations: Not on file  . Attends Archivist Meetings: Not on file  . Marital Status: Not on file  Intimate Partner Violence:   . Fear of Current or Ex-Partner: Not on file  .  Emotionally Abused: Not on file  . Physically Abused: Not on file  . Sexually Abused: Not on file   Review of Systems Sleeps fine with the tramadol and mirtazapine Appetite is not very good--weight down slightly. Usually eats good breakfast Does have occasionally ensure---asked her to take daily    Objective:   Physical Exam  Constitutional: No distress.  Neck: No thyromegaly present.  Cardiovascular: Normal rate,  regular rhythm and normal heart sounds. Exam reveals no gallop.  No murmur heard. Respiratory: Effort normal and breath sounds normal. No respiratory distress. She has no wheezes. She has no rales.  GI: Soft. There is no abdominal tenderness.  Musculoskeletal:        General: No edema.     Comments: Dilated veins  Lymphadenopathy:    She has no cervical adenopathy.  Psychiatric:  Mild depressed mood           Assessment & Plan:

## 2019-06-26 NOTE — Assessment & Plan Note (Signed)
Does feel it at times---but not severe Continues on the coumadin

## 2019-07-19 ENCOUNTER — Ambulatory Visit (INDEPENDENT_AMBULATORY_CARE_PROVIDER_SITE_OTHER): Payer: Medicare Other

## 2019-07-19 ENCOUNTER — Other Ambulatory Visit: Payer: Self-pay

## 2019-07-19 DIAGNOSIS — Z7901 Long term (current) use of anticoagulants: Secondary | ICD-10-CM

## 2019-07-19 LAB — POCT INR
INR: 2.4 (ref 2.0–3.0)
INR: 2.4 (ref 2.0–3.0)

## 2019-07-19 NOTE — Patient Instructions (Addendum)
Pre visit review using our clinic review tool, if applicable. No additional management support is needed unless otherwise documented below in the visit note.  Continue to take 3 mg daily and re-check in 4 weeks.    

## 2019-07-25 ENCOUNTER — Other Ambulatory Visit: Payer: Self-pay | Admitting: Cardiovascular Disease

## 2019-07-25 ENCOUNTER — Other Ambulatory Visit: Payer: Self-pay | Admitting: Internal Medicine

## 2019-07-25 NOTE — Telephone Encounter (Signed)
Please schedule 6 month F/U appointment with Dr. Caryl Comes. Thank you!

## 2019-07-26 ENCOUNTER — Other Ambulatory Visit: Payer: Self-pay | Admitting: Internal Medicine

## 2019-07-26 NOTE — Telephone Encounter (Signed)
Patient is scheduled now on 6/1 with Amanda [name] on 10/25 with Amanda [name] according to the recalls placed

## 2019-08-14 ENCOUNTER — Other Ambulatory Visit: Payer: Self-pay

## 2019-08-14 MED ORDER — TRIMETHOPRIM 100 MG PO TABS: 100.0000 mg | ORAL_TABLET | Freq: Every day | ORAL | 1 refills | Status: DC

## 2019-08-14 NOTE — Telephone Encounter (Signed)
Spoke to [place]. She does not need a rx for mail order, just enough to last until the lost med is found. Said #14 should be enough. If not, she will get the refill. [facility]

## 2019-08-14 NOTE — Telephone Encounter (Signed)
Okay to send Rx for #14  x [facility]. Print another one for a year if she needs that (as opposed to sending it electronically if we can)

## 2019-08-14 NOTE — Telephone Encounter (Signed)
[name] (DPR signed) left v/m that some of pts refill lost in mail. [name] request emergency rx for trimethoprim to [facility]. [name] will pick up rx. Trimethoprim 100 mg # 90 x 3 on 07/20/2018. Pt last seen FU on 06/26/19.

## 2019-08-16 ENCOUNTER — Other Ambulatory Visit: Payer: Self-pay

## 2019-08-16 ENCOUNTER — Ambulatory Visit (INDEPENDENT_AMBULATORY_CARE_PROVIDER_SITE_OTHER): Payer: Medicare Other

## 2019-08-16 DIAGNOSIS — Z7901 Long term (current) use of anticoagulants: Secondary | ICD-10-CM

## 2019-08-16 LAB — POCT INR: INR: 2.2 (ref 2.0–3.0)

## 2019-08-16 NOTE — Patient Instructions (Addendum)
Pre visit review using our clinic review tool, if applicable. No additional management support is needed unless otherwise documented below in the visit note.  Continue to take 3 mg daily and re-check in 5 weeks.

## 2019-08-28 ENCOUNTER — Other Ambulatory Visit: Payer: Self-pay | Admitting: Cardiovascular Disease

## 2019-08-28 ENCOUNTER — Other Ambulatory Visit: Payer: Self-pay | Admitting: Internal Medicine

## 2019-08-29 ENCOUNTER — Other Ambulatory Visit: Payer: Self-pay | Admitting: Internal Medicine

## 2019-08-29 NOTE — Telephone Encounter (Signed)
Last filled 06-05-19 #90 Last OV 06-26-19 Next OV 12-28-19 [facility]

## 2019-09-05 ENCOUNTER — Ambulatory Visit (INDEPENDENT_AMBULATORY_CARE_PROVIDER_SITE_OTHER): Payer: Medicare Other | Admitting: *Deleted

## 2019-09-05 DIAGNOSIS — I495 Sick sinus syndrome: Secondary | ICD-10-CM

## 2019-09-05 DIAGNOSIS — I480 Paroxysmal atrial fibrillation: Secondary | ICD-10-CM | POA: Diagnosis not present

## 2019-09-05 LAB — CUP PACEART REMOTE DEVICE CHECK
Battery Impedance: 326 Ohm
Battery Remaining Longevity: 100 mo
Battery Voltage: 2.79 V
Brady Statistic AP VP Percent: 1 %
Brady Statistic AP VS Percent: 97 %
Brady Statistic AS VP Percent: 0 %
Brady Statistic AS VS Percent: 1 %
Date Time Interrogation Session: 20210519104326
Implantable Lead Implant Date: 20070524
Implantable Lead Implant Date: 20070524
Implantable Lead Location: 753859
Implantable Lead Location: 753860
Implantable Lead Model: 5076
Implantable Lead Model: 5568
Implantable Pulse Generator Implant Date: 20170516
Lead Channel Impedance Value: 464 Ohm
Lead Channel Impedance Value: 804 Ohm
Lead Channel Pacing Threshold Amplitude: 0.75 V
Lead Channel Pacing Threshold Amplitude: 1.125 V
Lead Channel Pacing Threshold Pulse Width: 0.4 ms
Lead Channel Pacing Threshold Pulse Width: 0.4 ms
Lead Channel Setting Pacing Amplitude: 2.25 V
Lead Channel Setting Pacing Amplitude: 2.5 V
Lead Channel Setting Pacing Pulse Width: 0.4 ms
Lead Channel Setting Sensing Sensitivity: 2 mV

## 2019-09-07 NOTE — Progress Notes (Signed)
Remote pacemaker transmission.   

## 2019-09-11 ENCOUNTER — Other Ambulatory Visit: Payer: Self-pay | Admitting: Internal Medicine

## 2019-09-11 ENCOUNTER — Other Ambulatory Visit: Payer: Self-pay | Admitting: *Deleted

## 2019-09-11 MED ORDER — TRIMETHOPRIM 100 MG PO TABS: 100.0000 mg | ORAL_TABLET | Freq: Every day | ORAL | 0 refills | Status: DC

## 2019-09-11 NOTE — Telephone Encounter (Signed)
Amanda [name] pts daughter (DPR signed) left v/m; pt cannot get trimethoprim from mail order pharmacy until mid June. Amanda [name] request trimethoprim # 30 sent to [facility]. Amanda [name] request this be refilled today because pt will be out of trimethoprim on 09/12/19.Amanda [name] request cb when done.

## 2019-09-11 NOTE — Telephone Encounter (Signed)
Patient's daughter Amanda [name] called stating that she has gotten notice from [facility] that they will no longer provide Trimethoprim for her mom.Amanda [name] stated that she was advised to contact her PCP to change to an alternative medication. Advised patient's daughter that she needs to contact [employer] and find out what they cover as an alternative. Amanda [name] stated that she will reach out to the [facility] and get additional information and will call back.

## 2019-09-11 NOTE — Telephone Encounter (Signed)
Rx sent electronically.  

## 2019-09-18 ENCOUNTER — Encounter: Payer: Medicare Other | Admitting: Internal Medicine

## 2019-09-20 ENCOUNTER — Other Ambulatory Visit: Payer: Self-pay

## 2019-09-20 ENCOUNTER — Ambulatory Visit (INDEPENDENT_AMBULATORY_CARE_PROVIDER_SITE_OTHER): Payer: Medicare Other

## 2019-09-20 DIAGNOSIS — Z7901 Long term (current) use of anticoagulants: Secondary | ICD-10-CM

## 2019-09-20 LAB — POCT INR: INR: 4.9 — AB (ref 2.0–3.0)

## 2019-09-20 NOTE — Patient Instructions (Addendum)
Pre visit review using our clinic review tool, if applicable. No additional management support is needed unless otherwise documented below in the visit note.  Hold dose today and tomorrow and take 1.5mg  on Saturday then continue to take 3 mg daily and re-check in 2 weeks.

## 2019-09-30 ENCOUNTER — Other Ambulatory Visit: Payer: Self-pay | Admitting: Internal Medicine

## 2019-10-01 NOTE — Telephone Encounter (Signed)
I had given 10 day supply in past with idea that she could just start left over 3 days each time she has symptoms. If that is what they were doing (for UTI), okay to refill #20 x 0

## 2019-10-01 NOTE — Telephone Encounter (Signed)
Spoke to [place]. She said they are using it as needed. Has a few left but said she gets anxious when she runs low. Will send in the rx.

## 2019-10-04 ENCOUNTER — Ambulatory Visit: Payer: Medicare Other

## 2019-10-09 ENCOUNTER — Ambulatory Visit (INDEPENDENT_AMBULATORY_CARE_PROVIDER_SITE_OTHER): Payer: Medicare Other

## 2019-10-09 ENCOUNTER — Other Ambulatory Visit: Payer: Self-pay

## 2019-10-09 ENCOUNTER — Other Ambulatory Visit: Payer: Self-pay | Admitting: Internal Medicine

## 2019-10-09 DIAGNOSIS — Z7901 Long term (current) use of anticoagulants: Secondary | ICD-10-CM | POA: Diagnosis not present

## 2019-10-09 LAB — POCT INR: INR: 3.2 — AB (ref 2.0–3.0)

## 2019-10-09 NOTE — Patient Instructions (Addendum)
Pre visit review using our clinic review tool, if applicable. No additional management support is needed unless otherwise documented below in the visit note.  Reduce dose today to 1.5mg  then change weekly dose to take 3 mg daily  except take 1.5mg  on Wednesdays and re-check in 3 weeks.

## 2019-10-12 ENCOUNTER — Encounter: Payer: Self-pay | Admitting: Internal Medicine

## 2019-10-12 ENCOUNTER — Ambulatory Visit (INDEPENDENT_AMBULATORY_CARE_PROVIDER_SITE_OTHER): Payer: Medicare Other | Admitting: Internal Medicine

## 2019-10-12 ENCOUNTER — Ambulatory Visit (INDEPENDENT_AMBULATORY_CARE_PROVIDER_SITE_OTHER)
Admission: RE | Admit: 2019-10-12 | Discharge: 2019-10-12 | Disposition: A | Discharge status: Home / Self Care | Payer: Medicare Other | Source: Ambulatory Visit | Attending: Internal Medicine | Admitting: Internal Medicine

## 2019-10-12 ENCOUNTER — Other Ambulatory Visit: Payer: Self-pay

## 2019-10-12 DIAGNOSIS — M79672 Pain in left foot: Secondary | ICD-10-CM

## 2019-10-12 NOTE — Assessment & Plan Note (Addendum)
After fall 8 days ago Has been able to walk on it Sig bruising may be the reason for ongoing pain but can't exclude fracture Will check x-ray  X-ray is negative  Discussed elevation, keeping off it and topical rx May be sometime till complete resolution of pain

## 2019-10-12 NOTE — Progress Notes (Signed)
Subjective:    Patient ID: Amanda [name], female    DOB: [birth year], 86 y.o.   MRN: [id number]  HPI Here with daughter due to fall and left foot pain This visit occurred during the SARS-CoV-2 public health emergency.  Safety protocols were in place, including screening questions prior to the visit, additional usage of staff PPE, and extensive cleaning of exam room while observing appropriate contact time as indicated for disinfecting solutions.   Fell 8 days ago Was reaching over walker into [employer] onto walker----hit left side on walker and then ground Didn't pass out   Basically recovered other than the left foot Some bruising on left arm and hip--but not bothering her much Had slight skin break on back of head--slight lump present  Left foot is still painful Is able to walk and bear weight Mostly pain near toes on dorsal side  Tried rub on it---some help Tramadol helps her sleep --her usual dose  Current Outpatient Medications on File Prior to Visit  Medication Sig Dispense Refill  . acetaminophen (TYLENOL) 500 MG tablet Take 500 mg by mouth every 8 (eight) hours as needed for mild pain.     . cephALEXin (KEFLEX) 500 MG capsule TAKE 1 CAPSULE BY MOUTH THREE TIMES A DAY 20 capsule 2  . cholecalciferol (VITAMIN D) 1000 UNITS tablet Take 1,000 Units by mouth daily.    Marland Kitchen diltiazem (CARDIZEM CD) 120 MG 24 hr capsule TAKE 1 CAPSULE BY MOUTH  DAILY 90 capsule 3  . hydrochlorothiazide (HYDRODIURIL) 25 MG tablet TAKE 1 TABLET BY MOUTH  DAILY AS NEEDED 90 tablet 3  . levothyroxine (SYNTHROID) 75 MCG tablet TAKE 1 TABLET BY MOUTH  DAILY 90 tablet 3  . mirtazapine (REMERON) 15 MG tablet TAKE 1 TABLET BY MOUTH AT  BEDTIME 90 tablet 3  . nitroGLYCERIN (NITROSTAT) 0.4 MG SL tablet Place 0.4 mg under the tongue every 5 (five) minutes as needed for chest pain (x 3 doses).     . sotalol (BETAPACE) 120 MG tablet TAKE ONE-HALF TABLET BY  MOUTH TWICE A DAY 90 tablet 3  . traMADol (ULTRAM) 50 MG  tablet TAKE 1/2 TO 1 TABLET BY MOUTH 3 TIMES A DAY AS NEEDED FOR PAIN 90 tablet 0  . trimethoprim (TRIMPEX) 100 MG tablet TAKE 1 TABLET BY MOUTH EVERY DAY 30 tablet 11  . warfarin (COUMADIN) 1 MG tablet TAKE 1 TABLET BY MOUTH  DAILY AS DIRECTED BY  ANTICOAGULATION CLINIC 90 tablet 1  . warfarin (COUMADIN) 4 MG tablet TAKE 1 TABLET BY MOUTH  DAILY OR AS DIRECTED BY  COUMADIN CLINIC 90 tablet 1   No current facility-administered medications on file prior to visit.    Allergies  Allergen Reactions  . Amoxicillin Diarrhea    Weakness and atrial fib  . Augmentin [Amoxicillin-Pot Clavulanate] Diarrhea    Weakness and atrial fib  . Sulfonamide Derivatives     unknown    Past Medical History:  Diagnosis Date  . Atrial fibrillation ([place])   . Depression   . GERD (gastroesophageal reflux disease)   . Hyperlipidemia   . Hypertension   . Hypothyroidism    after Rx for thyroid cancer  . Mitral regurgitation   . Osteoarthritis   . Pacemaker   . Sleep disturbance   . Urinary incontinence    usually from UTI's    Past Surgical History:  Procedure Laterality Date  . BREAST BIOPSY  2002  . BREAST LUMPECTOMY  1983  . CATARACT EXTRACTION, BILATERAL    .  EP IMPLANTABLE DEVICE N/A 09/02/2015   Procedure: PPM Generator Changeout;  Surgeon: Evans Lance, MD;  Location: [facility];  Service: Cardiovascular;  Laterality: N/A;  . Goiter Removed  1976   cancerous. Then got RAI  . PACEMAKER INSERTION  5/07   after syncope  . TONSILLECTOMY AND ADENOIDECTOMY  1937  . VESICOVAGINAL FISTULA CLOSURE W/ TAH  1972    Family History  Problem Relation Age of Onset  . Diabetes Mother   . Stroke Sister   . Coronary artery disease Brother   . Prostate cancer Brother   . Breast cancer Neg Hx   . Colon cancer Neg Hx     Social History   Socioeconomic History  . Marital status: Widowed    Spouse name: Not on file  . Number of children: 2  . Years of education: Not on file  . Highest  education level: Not on file  Occupational History  . Occupation: homemaker  Tobacco Use  . Smoking status: Never Smoker  . Smokeless tobacco: Never Used  Substance and Sexual Activity  . Alcohol use: No  . Drug use: No  . Sexual activity: Not on file  Other Topics Concern  . Not on file  Social History Narrative   Widowed 2002. 1 local daughter. Other in [place]. Was homemaker. Farmed and gardened.    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    Daughter [name]) is health care POA.    Discussed DNR and she requests    Would not want tube feeds if cognitively unaware   Social Determinants of Health   Financial Resource Strain:   . Difficulty of Paying Living Expenses:   Food Insecurity:   . Worried About Charity fundraiser in the Last Year:   . Arboriculturist in the Last Year:   Transportation Needs:   . Film/video editor (Medical):   Marland Kitchen Lack of Transportation (Non-Medical):   Physical Activity:   . Days of Exercise per Week:   . Minutes of Exercise per Session:   Stress:   . Feeling of Stress :   Social Connections:   . Frequency of Communication with Friends and Family:   . Frequency of Social Gatherings with Friends and Family:   . Attends Religious Services:   . Active Member of Clubs or Organizations:   . Attends Archivist Meetings:   Marland Kitchen Marital Status:   Intimate Partner Violence:   . Fear of Current or Ex-Partner:   . Emotionally Abused:   Marland Kitchen Physically Abused:   . Sexually Abused:    Review of Systems No N/V Eating okay    Objective:   Physical Exam  Musculoskeletal:     Comments: Marked bruising of left foot with some ankle edema as well Very tender over multiple metatarsals and on both sides of ankle           Assessment & Plan:

## 2019-10-16 ENCOUNTER — Ambulatory Visit: Payer: Medicare Other

## 2019-10-29 ENCOUNTER — Other Ambulatory Visit: Payer: Self-pay | Admitting: Internal Medicine

## 2019-10-29 DIAGNOSIS — Z7901 Long term (current) use of anticoagulants: Secondary | ICD-10-CM

## 2019-10-29 NOTE — Telephone Encounter (Signed)
Pt is compliant with coumadin management. Sent in script 

## 2019-11-01 ENCOUNTER — Ambulatory Visit (INDEPENDENT_AMBULATORY_CARE_PROVIDER_SITE_OTHER): Payer: Medicare Other

## 2019-11-01 ENCOUNTER — Other Ambulatory Visit: Payer: Self-pay

## 2019-11-01 DIAGNOSIS — Z7901 Long term (current) use of anticoagulants: Secondary | ICD-10-CM

## 2019-11-01 DIAGNOSIS — I480 Paroxysmal atrial fibrillation: Secondary | ICD-10-CM | POA: Diagnosis not present

## 2019-11-01 LAB — POCT INR: INR: 3.4 — AB (ref 2.0–3.0)

## 2019-11-01 NOTE — Patient Instructions (Addendum)
Pre visit review using our clinic review tool, if applicable. No additional management support is needed unless otherwise documented below in the visit note.  Hold dose today then change weekly dose to take 3 mg daily  except take 1.5mg  Mon, Wed and Frid and re-check in 2 weeks.

## 2019-11-04 NOTE — Progress Notes (Signed)
Agree. Thanks

## 2019-11-15 ENCOUNTER — Other Ambulatory Visit: Payer: Self-pay

## 2019-11-15 ENCOUNTER — Ambulatory Visit (INDEPENDENT_AMBULATORY_CARE_PROVIDER_SITE_OTHER): Payer: Medicare Other

## 2019-11-15 DIAGNOSIS — Z7901 Long term (current) use of anticoagulants: Secondary | ICD-10-CM | POA: Diagnosis not present

## 2019-11-15 LAB — POCT INR: INR: 2.1 (ref 2.0–3.0)

## 2019-11-15 NOTE — Patient Instructions (Addendum)
Pre visit review using our clinic review tool, if applicable. No additional management support is needed unless otherwise documented below in the visit note.  Continue taking 3mg  daily except take 1.45mg  on Mon, Wed, and Fri and recheck in 3 wks.

## 2019-11-20 ENCOUNTER — Encounter: Payer: Self-pay | Admitting: Intensive Care

## 2019-11-20 ENCOUNTER — Other Ambulatory Visit: Payer: Self-pay

## 2019-11-20 ENCOUNTER — Emergency Department
Admission: EM | Admit: 2019-11-20 | Discharge: 2019-11-20 | Disposition: A | Discharge status: Skilled Nursing Facility | Payer: Medicare Other | Attending: Emergency Medicine | Admitting: Emergency Medicine

## 2019-11-20 ENCOUNTER — Emergency Department: Payer: Medicare Other

## 2019-11-20 DIAGNOSIS — Y998 Other external cause status: Secondary | ICD-10-CM | POA: Insufficient documentation

## 2019-11-20 DIAGNOSIS — Y92000 Kitchen of unspecified non-institutional (private) residence as  the place of occurrence of the external cause: Secondary | ICD-10-CM | POA: Insufficient documentation

## 2019-11-20 DIAGNOSIS — G9389 Other specified disorders of brain: Secondary | ICD-10-CM | POA: Diagnosis not present

## 2019-11-20 DIAGNOSIS — Z7901 Long term (current) use of anticoagulants: Secondary | ICD-10-CM | POA: Diagnosis not present

## 2019-11-20 DIAGNOSIS — I5032 Chronic diastolic (congestive) heart failure: Secondary | ICD-10-CM | POA: Diagnosis not present

## 2019-11-20 DIAGNOSIS — W133XXA Fall through floor, initial encounter: Secondary | ICD-10-CM | POA: Diagnosis not present

## 2019-11-20 DIAGNOSIS — I130 Hypertensive heart and chronic kidney disease with heart failure and stage 1 through stage 4 chronic kidney disease, or unspecified chronic kidney disease: Secondary | ICD-10-CM | POA: Insufficient documentation

## 2019-11-20 DIAGNOSIS — Y9389 Activity, other specified: Secondary | ICD-10-CM | POA: Diagnosis not present

## 2019-11-20 DIAGNOSIS — S0003XA Contusion of scalp, initial encounter: Secondary | ICD-10-CM | POA: Insufficient documentation

## 2019-11-20 DIAGNOSIS — Z79899 Other long term (current) drug therapy: Secondary | ICD-10-CM | POA: Insufficient documentation

## 2019-11-20 DIAGNOSIS — Z950 Presence of cardiac pacemaker: Secondary | ICD-10-CM | POA: Insufficient documentation

## 2019-11-20 DIAGNOSIS — M25571 Pain in right ankle and joints of right foot: Secondary | ICD-10-CM | POA: Diagnosis not present

## 2019-11-20 DIAGNOSIS — N184 Chronic kidney disease, stage 4 (severe): Secondary | ICD-10-CM | POA: Insufficient documentation

## 2019-11-20 DIAGNOSIS — S0990XA Unspecified injury of head, initial encounter: Secondary | ICD-10-CM | POA: Diagnosis not present

## 2019-11-20 DIAGNOSIS — W19XXXA Unspecified fall, initial encounter: Secondary | ICD-10-CM

## 2019-11-20 DIAGNOSIS — S0081XA Abrasion of other part of head, initial encounter: Secondary | ICD-10-CM | POA: Diagnosis not present

## 2019-11-20 DIAGNOSIS — S99911A Unspecified injury of right ankle, initial encounter: Secondary | ICD-10-CM | POA: Diagnosis not present

## 2019-11-20 DIAGNOSIS — J3489 Other specified disorders of nose and nasal sinuses: Secondary | ICD-10-CM | POA: Diagnosis not present

## 2019-11-20 DIAGNOSIS — I480 Paroxysmal atrial fibrillation: Secondary | ICD-10-CM | POA: Insufficient documentation

## 2019-11-20 DIAGNOSIS — E039 Hypothyroidism, unspecified: Secondary | ICD-10-CM | POA: Insufficient documentation

## 2019-11-20 DIAGNOSIS — R58 Hemorrhage, not elsewhere classified: Secondary | ICD-10-CM | POA: Diagnosis not present

## 2019-11-20 DIAGNOSIS — I10 Essential (primary) hypertension: Secondary | ICD-10-CM | POA: Diagnosis not present

## 2019-11-20 DIAGNOSIS — H7091 Unspecified mastoiditis, right ear: Secondary | ICD-10-CM | POA: Diagnosis not present

## 2019-11-20 LAB — COMPREHENSIVE METABOLIC PANEL
ALT: 11 U/L (ref 0–44)
AST: 22 U/L (ref 15–41)
Albumin: 4.5 g/dL (ref 3.5–5.0)
Alkaline Phosphatase: 68 U/L (ref 38–126)
Anion gap: 11 (ref 5–15)
BUN: 40 mg/dL — ABNORMAL HIGH (ref 8–23)
CO2: 27 mmol/L (ref 22–32)
Calcium: 9.8 mg/dL (ref 8.9–10.3)
Chloride: 99 mmol/L (ref 98–111)
Creatinine, Ser: 1.83 mg/dL — ABNORMAL HIGH (ref 0.44–1.00)
GFR calc Af Amer: 26 mL/min — ABNORMAL LOW (ref >60)
GFR calc non Af Amer: 23 mL/min — ABNORMAL LOW (ref >60)
Glucose, Bld: 120 mg/dL — ABNORMAL HIGH (ref 70–99)
Potassium: 4.3 mmol/L (ref 3.5–5.1)
Sodium: 137 mmol/L (ref 135–145)
Total Bilirubin: 0.8 mg/dL (ref 0.3–1.2)
Total Protein: 7.6 g/dL (ref 6.5–8.1)

## 2019-11-20 LAB — CBC WITH DIFFERENTIAL/PLATELET
Abs Immature Granulocytes: 0.04 10*3/uL (ref 0.00–0.07)
Basophils Absolute: 0.1 10*3/uL (ref 0.0–0.1)
Basophils Relative: 1 %
Eosinophils Absolute: 0.2 10*3/uL (ref 0.0–0.5)
Eosinophils Relative: 2 %
HCT: 32 % — ABNORMAL LOW (ref 36.0–46.0)
Hemoglobin: 10.4 g/dL — ABNORMAL LOW (ref 12.0–15.0)
Immature Granulocytes: 1 %
Lymphocytes Relative: 18 %
Lymphs Abs: 1.3 10*3/uL (ref 0.7–4.0)
MCH: 28.6 pg (ref 26.0–34.0)
MCHC: 32.5 g/dL (ref 30.0–36.0)
MCV: 87.9 fL (ref 80.0–100.0)
Monocytes Absolute: 0.9 10*3/uL (ref 0.1–1.0)
Monocytes Relative: 12 %
Neutro Abs: 4.8 10*3/uL (ref 1.7–7.7)
Neutrophils Relative %: 66 %
Platelets: 220 10*3/uL (ref 150–400)
RBC: 3.64 MIL/uL — ABNORMAL LOW (ref 3.87–5.11)
RDW: 14.6 % (ref 11.5–15.5)
WBC: 7.2 10*3/uL (ref 4.0–10.5)
nRBC: 0 % (ref 0.0–0.2)

## 2019-11-20 MED ORDER — ACETAMINOPHEN 500 MG PO TABS: 1000.0000 mg | ORAL_TABLET | Freq: Once | ORAL | Status: DC

## 2019-11-20 NOTE — ED Triage Notes (Signed)
Arrived by EMS from independent living at [facility]. Small laceration present to back of head. Bleeding controlled. Patient reports mechanical fall and tried to catch herself but unable to before falling to ground. Denies LOC.

## 2019-11-20 NOTE — Discharge Instructions (Addendum)
He was seen in the ED because of Amanda [name]'s fall and head trauma. While she has no evidence of a brain bleed or more serious injury, she does have what we call a subgaleal hematoma.  This is a small collection of blood beneath the skin and underneath the membrane that surrounds the skull.  The first step in managing this is compression bandages to that area to ensure no further collection of blood and to help squeeze it back into the blood vessels.  Please keep that area underneath the compression bandage for the next 1-2 weeks. She will need to follow-up with her PCP within the next few days, within a week, to assess the progress of this hematoma and to see if she will need referral to a neurosurgeon to help drain this.  She develops any fevers, pus coming from her scalp, strokelike symptoms or further falls, please return to the ED.

## 2019-11-20 NOTE — ED Notes (Signed)
Urine sample sent to lab

## 2019-11-20 NOTE — ED Triage Notes (Signed)
First RN note: Pt presents to ED via POV s/p at her independent living facility at [facility], unsure when the fall happened, pt with small laceration to top of her head.   167/77 78 18 94% RA

## 2019-11-20 NOTE — ED Notes (Signed)
EMS arrived to transport pt back to [facility] due to the c/o pain to her legs and inability to walk. Pt no where to be found in the department. This RN called the patients daughter and was informed that she already back at [facility] and she took her home and did not notify staff. EMS [place] and [place] base.

## 2019-11-20 NOTE — ED Provider Notes (Signed)
[facility] Emergency Department Provider Note ____________________________________________   First MD Initiated Contact with Patient 11/20/19 1948     (approximate)  I have reviewed the triage vital signs and the nursing notes.  HISTORY  Chief Complaint Fall and Laceration   HPI Amanda [name] is a 86 y.o. female present to the ED for evaluation of a fall and head laceration.  Chart review indicates history of A. fib on Coumadin.  INR 4 days ago therapeutic at 2.1. Patient lives within the independent living section of a local SNF, handling most of her own ADLs.  Daughter is at the bedside and reports that she sees her daily and provides assistance as needed.  Patient typically ambulates without assistance device.  Patient reports a fall that occurred just prior to arrival at her home, she reports turning around to grab something from the kitchen counter and falling to the floor.  She denies syncope, preceding chest pain, shortness of breath, headache.  She reports member the whole event and adamant that she did not pass out. Daughter reports that patient was able to get herself up from the floor and ambulate after the event independently.   Patient reports 3/10 aching constant headache at site of injury.  Has not taken medication for this pain.  Also reporting 1/10 aching to her right ankle.  Otherwise denies pain.  No subsequent vomiting, syncope or further falls or injuries.  Patient denies recent illnesses, fevers.  She reports tolerating p.o. intake and tolerating at baseline without diarrhea, abdominal pain, dysuria or vomiting.   Past Medical History:  Diagnosis Date   Atrial fibrillation ([place])    Depression    GERD (gastroesophageal reflux disease)    Hyperlipidemia    Hypertension    Hypothyroidism    after Rx for thyroid cancer   Mitral regurgitation    Osteoarthritis    Pacemaker    Sleep disturbance    Urinary incontinence     usually from UTI's    Patient Active Problem List   Diagnosis Date Noted   Left foot pain 10/12/2019   Acute cystitis 01/05/2019   Secondary hyperparathyroidism of renal origin ([place]) 02/14/2018   Advance directive discussed with patient 08/16/2017   Long term (current) use of anticoagulants 04/07/2017   Inguinal hernia 12/21/2016   Neuropathy 10/08/2016   Pedal edema 09/14/2016   Paroxysmal atrial fibrillation ([facility]) 04/20/2016   Anemia of chronic renal failure, stage 4 (severe) ([place]) 02/05/2015   Recurrent cystitis 02/25/2014   Routine general medical examination at a health care facility 07/16/2013   Thoracic aorta atherosclerosis ([place]) 07/16/2013   Encounter for therapeutic drug monitoring 06/11/2013   Chronic diastolic CHF (congestive heart failure) ([place]) 05/29/2013   Chronic renal disease, stage IV ([place]) 12/16/2011   GERD (gastroesophageal reflux disease)    SICK SINUS/ TACHY-BRADY SYNDROME 12/15/2009   PPM-Medtronic 12/15/2009   Mitral valve disorder 12/08/2009   Episodic mood disorder ([place]) 05/05/2009   HEARING LOSS 04/25/2009   Hypothyroidism 12/27/2008   Hyperlipidemia 12/27/2008   Essential hypertension 12/27/2008   OSTEOARTHRITIS 12/27/2008   RLS (restless legs syndrome) 12/27/2008   URINARY INCONTINENCE 12/27/2008    Past Surgical History:  Procedure Laterality Date   BREAST BIOPSY  2002   BREAST LUMPECTOMY  1983   CATARACT EXTRACTION, BILATERAL     EP IMPLANTABLE DEVICE N/A 09/02/2015   Procedure: PPM Generator Changeout;  Surgeon: Evans Lance, MD;  Location: [facility];  Service: Cardiovascular;  Laterality: N/A;  Goiter Removed  1976   cancerous. Then got RAI   PACEMAKER INSERTION  5/07   after syncope   TONSILLECTOMY AND ADENOIDECTOMY  1937   VESICOVAGINAL FISTULA CLOSURE W/ TAH  1972    Prior to Admission medications   Medication Sig Start Date End Date Taking? Authorizing Provider  acetaminophen  (TYLENOL) 500 MG tablet Take 500 mg by mouth every 8 (eight) hours as needed for mild pain.     [provider]  cephALEXin (KEFLEX) 500 MG capsule TAKE 1 CAPSULE BY MOUTH THREE TIMES A DAY 10/01/19   Viviana Simpler I, MD  cholecalciferol (VITAMIN D) 1000 UNITS tablet Take 1,000 Units by mouth daily.    [provider]  diltiazem (CARDIZEM CD) 120 MG 24 hr capsule TAKE 1 CAPSULE BY MOUTH  DAILY 08/28/19   Deboraha Sprang, MD  hydrochlorothiazide (HYDRODIURIL) 25 MG tablet TAKE 1 TABLET BY MOUTH  DAILY AS NEEDED 08/28/19   Minna Merritts, MD  levothyroxine (SYNTHROID) 75 MCG tablet TAKE 1 TABLET BY MOUTH  DAILY 03/02/19   Viviana Simpler I, MD  mirtazapine (REMERON) 15 MG tablet TAKE 1 TABLET BY MOUTH AT  BEDTIME 07/26/19   Venia Carbon, MD  nitroGLYCERIN (NITROSTAT) 0.4 MG SL tablet Place 0.4 mg under the tongue every 5 (five) minutes as needed for chest pain (x 3 doses).     [provider]  sotalol (BETAPACE) 120 MG tablet TAKE ONE-HALF TABLET BY  MOUTH TWICE A DAY 01/02/19   Viviana Simpler I, MD  traMADol (ULTRAM) 50 MG tablet TAKE 1/2 TO 1 TABLET BY MOUTH 3 TIMES A DAY AS NEEDED FOR PAIN 08/29/19   Venia Carbon, MD  trimethoprim (TRIMPEX) 100 MG tablet TAKE 1 TABLET BY MOUTH EVERY DAY 10/09/19   Viviana Simpler I, MD  warfarin (COUMADIN) 1 MG tablet TAKE 1 TABLET BY MOUTH  DAILY AS DIRECTED BY  ANTICOAGULATION CLINIC 10/29/19   Venia Carbon, MD  warfarin (COUMADIN) 4 MG tablet TAKE 1 TABLET BY MOUTH  DAILY OR AS DIRECTED BY  COUMADIN CLINIC 07/21/18   Venia Carbon, MD    Allergies Amoxicillin, Augmentin [amoxicillin-pot clavulanate], and Sulfonamide derivatives  Family History  Problem Relation Age of Onset   Diabetes Mother    Stroke Sister    Coronary artery disease Brother    Prostate cancer Brother    Breast cancer Neg Hx    Colon cancer Neg Hx     Social History Social History   Tobacco Use   Smoking status: Never Smoker    Smokeless tobacco: Never Used  Substance Use Topics   Alcohol use: No   Drug use: No    Review of Systems  Constitutional: No fever/chills Eyes: No visual changes. ENT: No sore throat. Cardiovascular: Denies chest pain. Respiratory: Denies shortness of breath. Gastrointestinal: No abdominal pain.  No nausea, no vomiting.  No diarrhea.  No constipation. Genitourinary: Negative for dysuria. Musculoskeletal: Negative for back pain. Skin: Negative for rash. Neurological: Negative for focal weakness or numbness.  Positive for headache ____________________________________________   PHYSICAL EXAM:  VITAL SIGNS: Vitals:   11/20/19 1743  BP: (!) 161/66  Pulse: 60  Resp: 16  Temp: 98.8 F (37.1 C)  SpO2: 100%      Constitutional: Alert and oriented. Well appearing and in no acute distress.  Hard of hearing and prefers to read lips.  Conversational in full sentences.  Denies complaints. Eyes: Conjunctivae are normal. PERRL. EOMI. Head: Right-sided occipital hematoma  about 1 cm in diameter, small central abrasion, but no discrete laceration to require repair.  Hemostatic.  Small amount of dried blood but no hair around this.  No bony step-offs or evidence of further head trauma. EOM does not precipitate headache or eye pain.  No midface instability. Nose: No congestion/rhinnorhea. Mouth/Throat: Mucous membranes are moist.  Oropharynx non-erythematous. Neck: No stridor. No cervical spine tenderness to palpation. Cardiovascular: Normal rate, regular rhythm. Grossly normal heart sounds.  Good peripheral circulation. Respiratory: Normal respiratory effort.  No retractions. Lungs CTAB. Gastrointestinal: Soft , nondistended, nontender to palpation. No abdominal bruits. No CVA tenderness. Musculoskeletal: No lower extremity tenderness nor edema.  No joint effusions.  5 mm superficial abrasion noted to the anterior right ankle that is hemostatic without discrete laceration.  Full active  and passive ROM of bilateral ankles.  Distally neurovascularly intact.  Patient denies pain to this area after receiving p.o. Tylenol. Full palpation of all 4 extremities otherwise not evidence of acute trauma, pain or tenderness. Patient able to sit forward to allow my examination of her back, no evidence of trauma to the back.  No spinal step-offs or tenderness throughout cervical, thoracic or lumbar spine. Neurologic:  Normal speech and language. No gross focal neurologic deficits are appreciated. No gait instability noted. Cranial nerves II through XII intact 5/5 strength and sensation in all 4 extremities Skin:  Skin is warm, dry and intact. No rash noted. Psychiatric: Mood and affect are normal. Speech and behavior are normal.  ____________________________________________   LABS (all labs ordered are listed, but only abnormal results are displayed)  Labs Reviewed  CBC WITH DIFFERENTIAL/PLATELET - Abnormal; Notable for the following components:      Result Value   RBC 3.64 (*)    Hemoglobin 10.4 (*)    HCT 32.0 (*)    All other components within normal limits  COMPREHENSIVE METABOLIC PANEL - Abnormal; Notable for the following components:   Glucose, Bld 120 (*)    BUN 40 (*)    Creatinine, Ser 1.83 (*)    GFR calc non Af Amer 23 (*)    GFR calc Af Amer 26 (*)    All other components within normal limits    ____________________________________________  RADIOLOGY  ED MD interpretation: CT head without evidence of acute intracranial pathology.  Right-sided occipital hematoma noted.  Official radiology report(s): DG Ankle Complete Right  Result Date: 11/20/2019 CLINICAL DATA:  Pain status post fall. EXAM: RIGHT ANKLE - COMPLETE 3+ VIEW COMPARISON:  None. FINDINGS: There is no evidence of fracture, dislocation, or joint effusion. There is no evidence of arthropathy or other focal bone abnormality. Soft tissues are unremarkable. IMPRESSION: Negative. Electronically Signed   By:  Constance Holster M.D.   On: 11/20/2019 20:22   CT Head Wo Contrast  Result Date: 11/20/2019 CLINICAL DATA:  Fall, headache EXAM: CT HEAD WITHOUT CONTRAST TECHNIQUE: Contiguous axial images were obtained from the base of the skull through the vertex without intravenous contrast. COMPARISON:  CT 03/03/2018, 05/12/2017 FINDINGS: Brain: No evidence of acute infarction, hemorrhage, hydrocephalus, extra-axial collection or midline shift. Symmetric prominence of the ventricles, cisterns and sulci compatible with parenchymal volume loss. Patchy areas of white matter hypoattenuation are most compatible with chronic microvascular angiopathy. Multiple dural calcifications are grossly similar to comparison including a slightly larger right para falcine calcification measuring 5 x 13 x 13 mm with dural tails which could suggest a calcified meningioma. No resulting mass effect or edema. Vascular: Atherosclerotic calcification of the carotid  siphons and intradural vertebral arteries. No hyperdense vessel. Skull: Right parieto-occipital scalp swelling with crescentic likely subgaleal hematoma measuring up to 6 mm in maximal thickness and some overlying laceration/abrasion. No subjacent calvarial fracture. Stable benign sessile right frontal osteoma (3/23). Sinuses/Orbits: Redemonstration of a grossly stable 14 mm polypoid soft tissue density in the right ethmoids with associated bony remodeling and chronic opacification of the right frontal sinuses and anterior ethmoids. Remaining paranasal sinuses are predominantly clear. Hypo pneumatization of the mastoid air cells and chronic opacification of the right mastoid air cell and middle ear cavity. Ossicular chain is grossly maintained within the limitations of this thick slice exam. Bilateral lens extractions. Other: Bilateral TMJ arthrosis. IMPRESSION: 1. Right parieto-occipital scalp swelling with crescentic likely subgaleal hematoma measuring up to 6 mm in maximal thickness  and some overlying laceration/abrasion. No subjacent calvarial fracture. 2. No acute traumatic intracranial abnormality. 3. Stable moderate parenchymal volume loss and chronic microvascular angiopathy. 4. Stable dural calcifications including a larger right parafalcine calcification with dural tails which could suggest a calcified meningioma. No resulting mass effect or edema. 5. Redemonstration of a grossly stable polypoid soft tissue density in the right ethmoids with associated bony remodeling and chronic opacification of the right frontal sinuses and anterior ethmoids. Recommend 6. Chronic opacification of the right mastoid air cells and middle ear cavity. Correlate for signs and symptoms of otomastoiditis. 7. Recommend outpatient ENT consultation for these latter findings if not previously performed. Electronically Signed   By: Lovena Le M.D.   On: 11/20/2019 18:20    ____________________________________________   PROCEDURES and INTERVENTIONS  Procedure(s) performed (including Critical Care):  Procedures  Medications  acetaminophen (TYLENOL) tablet 1,000 mg (has no administration in time range)    ____________________________________________   INITIAL IMPRESSION / ASSESSMENT AND PLAN / ED COURSE  86 year old woman anticoagulated on Coumadin for A. fib, presenting after mechanical fall with evidence of subgaleal hematoma and amenable to outpatient management. Normal vital signs on room air. Reassuring exam without evidence of neurologic deficits or distress. She is amenable abrasion to her right ankle, the function is intact. She has hematoma to the right occipital scalp that is hemostatic without laceration requiring repair. Imaging reviewed with evidence of subgaleal hematoma, but no intracranial pathology. No evidence of ankle fracture or dislocation on imaging. Blood work with known chronic pathology, and no evidence of acute derangements. Patient is well-appearing here in the ED as  already demonstrated her ability to ambulate at baseline, and daughter is reassured by her behavior. Patient has very good outpatient follow-up with 2 daughters that see her every single day, as well as a engaged PCP. I discussed outpatient management with patient and her daughters with compression dressings over the subgaleal hematoma and close follow-up with her PCP. We discussed the possibility of neurosurgical drainage if conservative measures are not successful. Answered questions and patient and daughter expressed understanding and agreement. Compression dressing placed prior to discharge, and patient is medically stable for discharge home.  Clinical Course as of Nov 20 2051  Tue Nov 20, 2019  1953 Labs reviewed with CKD and normocytic anemia at baseline.   [DS]  2021 Educated patient and her daughter on subgaleal hematoma management with compression bandage and follow-up with PCP.  Advised them that if compression bandage is not successful in resolving this, neurosurgery may be required.  For drainage   [DS]    Clinical Course User Index [DS] Vladimir Crofts, MD     ____________________________________________   FINAL CLINICAL IMPRESSION(S) /  ED DIAGNOSES  Final diagnoses:  Subgaleal hemorrhage  Injury of head, initial encounter  Fall, initial encounter  Acute right ankle pain     ED Discharge Orders    None       Tashiana Lamarca   Note:  This document was prepared using Dragon voice recognition software and may include unintentional dictation errors.   Vladimir Crofts, MD 11/20/19 2105

## 2019-11-20 NOTE — ED Notes (Signed)
Unable to give report to [facility] at this time. Will attempt to give report at a later time  Transport services requested

## 2019-11-21 ENCOUNTER — Other Ambulatory Visit: Payer: Self-pay | Admitting: Internal Medicine

## 2019-11-21 NOTE — Telephone Encounter (Signed)
Last filled 08-29-19 #90 Last OV 10-12-19 Next OV 12-28-19 [facility]

## 2019-11-22 DIAGNOSIS — H903 Sensorineural hearing loss, bilateral: Secondary | ICD-10-CM | POA: Diagnosis not present

## 2019-11-22 DIAGNOSIS — H6123 Impacted cerumen, bilateral: Secondary | ICD-10-CM | POA: Diagnosis not present

## 2019-11-22 DIAGNOSIS — R040 Epistaxis: Secondary | ICD-10-CM | POA: Diagnosis not present

## 2019-12-03 ENCOUNTER — Telehealth: Payer: Self-pay

## 2019-12-03 ENCOUNTER — Other Ambulatory Visit: Payer: Self-pay | Admitting: Internal Medicine

## 2019-12-03 NOTE — Telephone Encounter (Signed)
Spoke to pt's daughter, [name]. She was here last week and had discussed pt with Dr Silvio Pate. She is doing fine since the fall.

## 2019-12-04 ENCOUNTER — Encounter: Payer: Self-pay | Admitting: Internal Medicine

## 2019-12-04 ENCOUNTER — Ambulatory Visit (INDEPENDENT_AMBULATORY_CARE_PROVIDER_SITE_OTHER): Payer: Medicare Other | Admitting: Internal Medicine

## 2019-12-04 ENCOUNTER — Other Ambulatory Visit: Payer: Self-pay

## 2019-12-04 VITALS — BP 128/50 | HR 64 | Ht 62.0 in | Wt 108.0 lb

## 2019-12-04 DIAGNOSIS — I480 Paroxysmal atrial fibrillation: Secondary | ICD-10-CM

## 2019-12-04 DIAGNOSIS — I10 Essential (primary) hypertension: Secondary | ICD-10-CM

## 2019-12-04 DIAGNOSIS — Z950 Presence of cardiac pacemaker: Secondary | ICD-10-CM

## 2019-12-04 DIAGNOSIS — I495 Sick sinus syndrome: Secondary | ICD-10-CM | POA: Diagnosis not present

## 2019-12-04 DIAGNOSIS — Z79899 Other long term (current) drug therapy: Secondary | ICD-10-CM

## 2019-12-04 LAB — CUP PACEART INCLINIC DEVICE CHECK
Battery Impedance: 351 Ohm
Battery Remaining Longevity: 98 mo
Battery Voltage: 2.79 V
Brady Statistic AP VP Percent: 2 %
Brady Statistic AP VS Percent: 97 %
Brady Statistic AS VP Percent: 0 %
Brady Statistic AS VS Percent: 1 %
Date Time Interrogation Session: 20210817123753
Implantable Lead Implant Date: 20070524
Implantable Lead Implant Date: 20070524
Implantable Lead Location: 753859
Implantable Lead Location: 753860
Implantable Lead Model: 5076
Implantable Lead Model: 5568
Implantable Pulse Generator Implant Date: 20170516
Lead Channel Impedance Value: 449 Ohm
Lead Channel Impedance Value: 716 Ohm
Lead Channel Pacing Threshold Amplitude: 0.75 V
Lead Channel Pacing Threshold Amplitude: 1 V
Lead Channel Pacing Threshold Pulse Width: 0.4 ms
Lead Channel Pacing Threshold Pulse Width: 0.4 ms
Lead Channel Sensing Intrinsic Amplitude: 2 mV
Lead Channel Sensing Intrinsic Amplitude: 5.6 mV
Lead Channel Setting Pacing Amplitude: 2 V
Lead Channel Setting Pacing Amplitude: 2.5 V
Lead Channel Setting Pacing Pulse Width: 0.4 ms
Lead Channel Setting Sensing Sensitivity: 2 mV

## 2019-12-04 NOTE — Progress Notes (Signed)
Patient Care Team: Venia Carbon, MD as PCP - General Rockey Situ Kathlene November, MD as Consulting Physician (Cardiology)   HPI  Amanda [name] is a 86 y.o. female seen in followup with a previously implanted pacemaker in 2007 for syncope and apparently tachybradycardia syndrome; she has a history of paroxysmal atrial fibrillation managed with sotalol. She underwent generator replacement 5/17  She still lives by herself and does most of her daily chores.  She has had some falls.  Orthostatic lightheadedness.  Pain in her legs with some neuropathic symptoms.  Takes tramadol to sleep.   No shortness of breath or chest pain.    Date Cr K Mg Hgb  6/17  1.76 4.3    4/18  1.28 4.1  10.3  1/19 2.15 4.0  10.8  9/20 2.08 4.5    8/21 1.83 4.6  10.4             Past Medical History:  Diagnosis Date  . Atrial fibrillation ([place])   . Depression   . GERD (gastroesophageal reflux disease)   . Hyperlipidemia   . Hypertension   . Hypothyroidism    after Rx for thyroid cancer  . Mitral regurgitation   . Osteoarthritis   . Pacemaker   . Sleep disturbance   . Urinary incontinence    usually from UTI's    Past Surgical History:  Procedure Laterality Date  . BREAST BIOPSY  2002  . BREAST LUMPECTOMY  1983  . CATARACT EXTRACTION, BILATERAL    . EP IMPLANTABLE DEVICE N/A 09/02/2015   Procedure: PPM Generator Changeout;  Surgeon: Evans Lance, MD;  Location: [facility];  Service: Cardiovascular;  Laterality: N/A;  . Goiter Removed  1976   cancerous. Then got RAI  . PACEMAKER INSERTION  5/07   after syncope  . TONSILLECTOMY AND ADENOIDECTOMY  1937  . VESICOVAGINAL FISTULA CLOSURE W/ TAH  1972    Current Outpatient Medications  Medication Sig Dispense Refill  . acetaminophen (TYLENOL) 500 MG tablet Take 500 mg by mouth every 8 (eight) hours as needed for mild pain.     . cephALEXin (KEFLEX) 500 MG capsule Take 500 mg by mouth 3 (three) times daily as needed.    .  cholecalciferol (VITAMIN D) 1000 UNITS tablet Take 1,000 Units by mouth daily.    Marland Kitchen diltiazem (CARDIZEM CD) 120 MG 24 hr capsule TAKE 1 CAPSULE BY MOUTH  DAILY 90 capsule 3  . hydrochlorothiazide (HYDRODIURIL) 25 MG tablet TAKE 1 TABLET BY MOUTH  DAILY AS NEEDED 90 tablet 3  . levothyroxine (SYNTHROID) 75 MCG tablet TAKE 1 TABLET BY MOUTH  DAILY 90 tablet 3  . mirtazapine (REMERON) 15 MG tablet TAKE 1 TABLET BY MOUTH AT  BEDTIME 90 tablet 3  . nitroGLYCERIN (NITROSTAT) 0.4 MG SL tablet Place 0.4 mg under the tongue every 5 (five) minutes as needed for chest pain (x 3 doses).     . sotalol (BETAPACE) 120 MG tablet TAKE ONE-HALF TABLET BY  MOUTH TWICE DAILY 90 tablet 3  . traMADol (ULTRAM) 50 MG tablet TAKE 1/2 TO 1 TABLET BY MOUTH 3 TIMES A DAY AS NEEDED FOR PAIN 90 tablet 0  . trimethoprim (TRIMPEX) 100 MG tablet TAKE 1 TABLET BY MOUTH EVERY DAY 30 tablet 11  . warfarin (COUMADIN) 1 MG tablet TAKE 1 TABLET BY MOUTH  DAILY AS DIRECTED BY  ANTICOAGULATION CLINIC 90 tablet 2  . warfarin (COUMADIN) 4 MG tablet TAKE 1 TABLET BY  MOUTH  DAILY OR AS DIRECTED BY  COUMADIN CLINIC 90 tablet 1   No current facility-administered medications for this visit.    Allergies  Allergen Reactions  . Amoxicillin Diarrhea    Weakness and atrial fib  . Augmentin [Amoxicillin-Pot Clavulanate] Diarrhea    Weakness and atrial fib  . Sulfonamide Derivatives     unknown    Review of Systems negative except from HPI and PMH  Physical Exam BP (!) 128/50 (BP Location: Right Arm, Patient Position: Sitting, Cuff Size: Normal)   Pulse 64   Ht 5\' 2"  (1.575 m)   Wt 108 lb (49 kg)   SpO2 97%   BMI 19.75 kg/m   HR 61 Well developed and well nourished in no acute distress HENT normal Neck supple   Clear Device pocket well healed; without hematoma or erythema.  There is no tethering  Regular rate and rhythm, no  murmur Abd-soft with active BS No Clubbing cyanosis   edema Skin-warm and dry A & Oriented  Grossly  normal sensory and motor function  ECG atrial pacing at 64 Interval 24/09/40   Assessment and  Plan  Atrial fibrillation with some undersensing  Pacemaker-Medtronic The patient's device was interrogated.  The information was reviewed. No changes were made in the programming.     High risk medication surveillance-sotalol  Renal insufficiency Gd 5  Hypertension  Orthostatic intolerance   Trivial intercurrent atrial fibrillation  On Anticoagulation;  No bleeding issues   We will stop the diltiazem.  Blood pressure today is quite reasonable.  I think systolic hypertension is much less an issue then is orthostasis than recurrent falls; have discussed this with her and her daughter and they are agreeable.  We will continue her on sotalol as per the notes of her last visit.

## 2019-12-04 NOTE — Patient Instructions (Signed)
Medication Instructions:   STOP diltiazem (Cardizem SR)  *If you need a refill on your cardiac medications before your next appointment, please call your pharmacy*   Lab Work: N/A  If you have labs (blood work) drawn today and your tests are completely normal, you will receive your results only by: Marland Kitchen MyChart Message (if you have MyChart) OR . A paper copy in the mail If you have any lab test that is abnormal or we need to change your treatment, we will call you to review the results.   Testing/Procedures: N/A   Follow-Up: At [facility], you and your health needs are our priority.  As part of our continuing mission to provide you with exceptional heart care, we have created designated Provider Care Teams.  These Care Teams include your primary Cardiologist (physician) and Advanced Practice Providers (APPs -  Physician Assistants and Nurse Practitioners) who all work together to provide you with the care you need, when you need it.  We recommend signing up for the patient portal called "MyChart".  Sign up information is provided on this After Visit Summary.  MyChart is used to connect with patients for Virtual Visits (Telemedicine).  Patients are able to view lab/test results, encounter notes, upcoming appointments, etc.  Non-urgent messages can be sent to your provider as well.   To learn more about what you can do with MyChart, go to [email or web address].    Your next appointment:   1 year(s)  The format for your next appointment:   In Person  Provider:   Virl Axe, MD   Other Instructions N/A

## 2019-12-05 ENCOUNTER — Encounter: Payer: Self-pay | Admitting: Internal Medicine

## 2019-12-06 ENCOUNTER — Ambulatory Visit (INDEPENDENT_AMBULATORY_CARE_PROVIDER_SITE_OTHER): Payer: Medicare Other

## 2019-12-06 ENCOUNTER — Other Ambulatory Visit: Payer: Self-pay

## 2019-12-06 DIAGNOSIS — Z7901 Long term (current) use of anticoagulants: Secondary | ICD-10-CM | POA: Diagnosis not present

## 2019-12-06 LAB — POCT INR: INR: 2.6 (ref 2.0–3.0)

## 2019-12-06 NOTE — Patient Instructions (Addendum)
Pre visit review using our clinic review tool, if applicable. No additional management support is needed unless otherwise documented below in the visit note.  Continue taking 3mg  daily except take 1.45mg  on Mon, Wed, and Fri and recheck in 4 wks.

## 2019-12-24 ENCOUNTER — Other Ambulatory Visit: Payer: Self-pay | Admitting: Internal Medicine

## 2019-12-28 ENCOUNTER — Ambulatory Visit (INDEPENDENT_AMBULATORY_CARE_PROVIDER_SITE_OTHER): Payer: Medicare Other | Admitting: Internal Medicine

## 2019-12-28 ENCOUNTER — Encounter: Payer: Self-pay | Admitting: Internal Medicine

## 2019-12-28 ENCOUNTER — Other Ambulatory Visit: Payer: Self-pay

## 2019-12-28 VITALS — BP 110/74 | HR 62 | Temp 97.2°F | Ht 62.0 in | Wt 108.0 lb

## 2019-12-28 DIAGNOSIS — I5032 Chronic diastolic (congestive) heart failure: Secondary | ICD-10-CM

## 2019-12-28 DIAGNOSIS — Z0000 Encounter for general adult medical examination without abnormal findings: Secondary | ICD-10-CM

## 2019-12-28 DIAGNOSIS — Z23 Encounter for immunization: Secondary | ICD-10-CM | POA: Diagnosis not present

## 2019-12-28 DIAGNOSIS — I480 Paroxysmal atrial fibrillation: Secondary | ICD-10-CM | POA: Diagnosis not present

## 2019-12-28 DIAGNOSIS — F39 Unspecified mood [affective] disorder: Secondary | ICD-10-CM

## 2019-12-28 DIAGNOSIS — N184 Chronic kidney disease, stage 4 (severe): Secondary | ICD-10-CM

## 2019-12-28 DIAGNOSIS — E441 Mild protein-calorie malnutrition: Secondary | ICD-10-CM | POA: Diagnosis not present

## 2019-12-28 NOTE — Assessment & Plan Note (Signed)
I have personally reviewed the Medicare Annual Wellness questionnaire and have noted 1. The patient's medical and social history 2. Their use of alcohol, tobacco or illicit drugs 3. Their current medications and supplements 4. The patient's functional ability including ADL's, fall risks, home safety risks and hearing or visual             impairment. 5. Diet and physical activities 6. Evidence for depression or mood disorders  The patients weight, height, BMI and visual acuity have been recorded in the chart I have made referrals, counseling and provided education to the patient based review of the above and I have provided the pt with a written personalized care plan for preventive services.  I have provided you with a copy of your personalized plan for preventive services. Please take the time to review along with your updated medication list.  Flu vaccine today COVID booster when available Discussed safety No cancer screening due to age

## 2019-12-28 NOTE — Assessment & Plan Note (Signed)
Stable GFR No Rx other than vitamin D for secondary hyperparathyroidism

## 2019-12-28 NOTE — Assessment & Plan Note (Signed)
On mirtazpine Discussed adding boost/ensure

## 2019-12-28 NOTE — Addendum Note (Signed)
Addended by: Pilar Grammes on: 12/28/2019 11:49 AM   Modules accepted: Orders

## 2019-12-28 NOTE — Assessment & Plan Note (Signed)
Compensated. No changes needed. 

## 2019-12-28 NOTE — Assessment & Plan Note (Addendum)
Chronic dysthymia No Rx appropriate other than mirtazapine---which is also for appetite

## 2019-12-28 NOTE — Progress Notes (Signed)
Hearing Screening   125Hz  250Hz  500Hz  1000Hz  2000Hz  3000Hz  4000Hz  6000Hz  8000Hz   Right ear:           Left ear:           Comments: Has Hearing Aids. Wearing them today.  Vision Screening Comments: Refuses vision testing and visits

## 2019-12-28 NOTE — Progress Notes (Signed)
Subjective:    Patient ID: Amanda [name], female    DOB: 07-27-1919, 86 y.o.   MRN: [id number]  HPI Here with daughter Amanda [name] for Medicare wellness visit and follow up of chronic health conditions This visit occurred during the SARS-CoV-2 public health emergency.  Safety protocols were in place, including screening questions prior to the visit, additional usage of staff PPE, and extensive cleaning of exam room while observing appropriate contact time as indicated for disinfecting solutions.   Reviewed form and advanced directives Reviewed other doctors Poor vision--past stroke affecting right eye Hearing aides Chronic mild depression --no worsening. Not anhedonic--enjoys TV and getting out and seeing great, great grandchildren Some memory issues---most recall issues  [place] a month ago Was preoccupied and turned around--fell Seen in ER--reviewed visit. Subgaleal bleed without intracranial injury Feels back to normal  Still lives alone [place] with rollator Does instrumental ADLs (except vacuuming and making bed)--but slowly Daughter mostly does her shopping--or takes her Some urine incontinence---wears pad  Taking the trimethoprim daily Uses the cephalexin when she has breakthrough infection  Now off diltiazem--in case low BP affecting balance No palpitations Occasional dizziness ---- no syncope No chest pain Awakens feeling poorly some days--usually improves after moving bowels and breakfast  Does get some gas and heartburn---not a lot Uses tums with success Occasional trouble swallowing--not a big deal  Recent labs still show GFR in 20's Has known elevated PTH  Current Outpatient Medications on File Prior to Visit  Medication Sig Dispense Refill  . acetaminophen (TYLENOL) 500 MG tablet Take 500 mg by mouth every 8 (eight) hours as needed for mild pain.     . cephALEXin (KEFLEX) 500 MG capsule Take 500 mg by mouth 3 (three) times daily as needed.    . cholecalciferol  (VITAMIN D) 1000 UNITS tablet Take 1,000 Units by mouth daily.    . hydrochlorothiazide (HYDRODIURIL) 25 MG tablet TAKE 1 TABLET BY MOUTH  DAILY AS NEEDED 90 tablet 3  . levothyroxine (SYNTHROID) 75 MCG tablet TAKE 1 TABLET BY MOUTH  DAILY 90 tablet 3  . mirtazapine (REMERON) 15 MG tablet TAKE 1 TABLET BY MOUTH AT  BEDTIME 90 tablet 3  . nitroGLYCERIN (NITROSTAT) 0.4 MG SL tablet Place 0.4 mg under the tongue every 5 (five) minutes as needed for chest pain (x 3 doses).     . sotalol (BETAPACE) 120 MG tablet TAKE ONE-HALF TABLET BY  MOUTH TWICE DAILY 90 tablet 3  . traMADol (ULTRAM) 50 MG tablet TAKE 1/2 TO 1 TABLET BY MOUTH 3 TIMES A DAY AS NEEDED FOR PAIN 90 tablet 0  . trimethoprim (TRIMPEX) 100 MG tablet TAKE 1 TABLET BY MOUTH EVERY DAY 30 tablet 11  . warfarin (COUMADIN) 1 MG tablet TAKE 1 TABLET BY MOUTH  DAILY AS DIRECTED BY  ANTICOAGULATION CLINIC 90 tablet 2  . warfarin (COUMADIN) 4 MG tablet TAKE 1 TABLET BY MOUTH  DAILY OR AS DIRECTED BY  COUMADIN CLINIC 90 tablet 1   No current facility-administered medications on file prior to visit.    Allergies  Allergen Reactions  . Amoxicillin Diarrhea    Weakness and atrial fib  . Augmentin [Amoxicillin-Pot Clavulanate] Diarrhea    Weakness and atrial fib  . Sulfonamide Derivatives     unknown    Past Medical History:  Diagnosis Date  . Atrial fibrillation ([place])   . Depression   . GERD (gastroesophageal reflux disease)   . Hyperlipidemia   . Hypertension   . Hypothyroidism  after Rx for thyroid cancer  . Mitral regurgitation   . Osteoarthritis   . Pacemaker   . Sleep disturbance   . Urinary incontinence    usually from UTI's    Past Surgical History:  Procedure Laterality Date  . BREAST BIOPSY  2002  . BREAST LUMPECTOMY  1983  . CATARACT EXTRACTION, BILATERAL    . EP IMPLANTABLE DEVICE N/A 09/02/2015   Procedure: PPM Generator Changeout;  Surgeon: Evans Lance, MD;  Location: [facility];  Service:  Cardiovascular;  Laterality: N/A;  . Goiter Removed  1976   cancerous. Then got RAI  . PACEMAKER INSERTION  5/07   after syncope  . TONSILLECTOMY AND ADENOIDECTOMY  1937  . VESICOVAGINAL FISTULA CLOSURE W/ TAH  1972    Family History  Problem Relation Age of Onset  . Diabetes Mother   . Stroke Sister   . Coronary artery disease Brother   . Prostate cancer Brother   . Breast cancer Neg Hx   . Colon cancer Neg Hx     Social History   Socioeconomic History  . Marital status: Widowed    Spouse name: Not on file  . Number of children: 2  . Years of education: Not on file  . Highest education level: Not on file  Occupational History  . Occupation: homemaker  Tobacco Use  . Smoking status: Never Smoker  . Smokeless tobacco: Never Used  Substance and Sexual Activity  . Alcohol use: No  . Drug use: No  . Sexual activity: Not on file  Other Topics Concern  . Not on file  Social History Narrative   Widowed 2002. 1 local daughter. Other in [place]. Was homemaker. Farmed and gardened.    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    Daughter Amanda [name]) is health care POA.    Discussed DNR and she requests    Would not want tube feeds if cognitively unaware   Social Determinants of Health   Financial Resource Strain:   . Difficulty of Paying Living Expenses: Not on file  Food Insecurity:   . Worried About Charity fundraiser in the Last Year: Not on file  . Ran Out of Food in the Last Year: Not on file  Transportation Needs:   . Lack of Transportation (Medical): Not on file  . Lack of Transportation (Non-Medical): Not on file  Physical Activity:   . Days of Exercise per Week: Not on file  . Minutes of Exercise per Session: Not on file  Stress:   . Feeling of Stress : Not on file  Social Connections:   . Frequency of Communication with Friends and Family: Not on file  . Frequency of Social Gatherings with Friends and Family: Not on file  . Attends Religious Services: Not on file  .  Active Member of Clubs or Organizations: Not on file  . Attends Archivist Meetings: Not on file  . Marital Status: Not on file  Intimate Partner Violence:   . Fear of Current or Ex-Partner: Not on file  . Emotionally Abused: Not on file  . Physically Abused: Not on file  . Sexually Abused: Not on file   Review of Systems  Appetite is fair--trying to eat more Has lost more weight---- down 8# or so in past year Sleeping fair---some restless nights (just relaxes in bed) Wears seat belt No teeth---dentures Bowels are fairly regular--no blood Uses tramadol at night to help sleep---chronic mild pain (she minimizes this)  Objective:   Physical Exam HENT:     Mouth/Throat:     Comments: No oral lesions Full dentures Cardiovascular:     Rate and Rhythm: Normal rate and regular rhythm.     Heart sounds: No murmur heard.  No gallop.      Comments: Pedal pulses absent Pulmonary:     Effort: Pulmonary effort is normal.     Breath sounds: Normal breath sounds. No wheezing or rales.  Abdominal:     Palpations: Abdomen is soft.     Tenderness: There is no abdominal tenderness.  Musculoskeletal:     Cervical back: Neck supple.     Right lower leg: No edema.     Left lower leg: No edema.  Lymphadenopathy:     Cervical: No cervical adenopathy.  Skin:    Comments: Stasis derm in calves No ulcers  Neurological:     Mental Status: She is alert and oriented to person, place, and time.     Comments: President--- "Zoila Shutter, Obama" [id number] Recall 3/3  Psychiatric:        Mood and Affect: Mood normal.        Behavior: Behavior normal.            Assessment & Plan:

## 2019-12-28 NOTE — Assessment & Plan Note (Signed)
Paced On sotalol warfarin

## 2020-01-03 ENCOUNTER — Ambulatory Visit (INDEPENDENT_AMBULATORY_CARE_PROVIDER_SITE_OTHER): Payer: Medicare Other

## 2020-01-03 ENCOUNTER — Other Ambulatory Visit: Payer: Self-pay

## 2020-01-03 DIAGNOSIS — Z7901 Long term (current) use of anticoagulants: Secondary | ICD-10-CM

## 2020-01-03 LAB — POCT INR: INR: 2.5 (ref 2.0–3.0)

## 2020-01-03 NOTE — Patient Instructions (Addendum)
Pre visit review using our clinic review tool, if applicable. No additional management support is needed unless otherwise documented below in the visit note.  Continue taking 3mg  daily except take 1.45mg  on Mon, Wed, and Fri and recheck in 5 wks.

## 2020-02-07 ENCOUNTER — Ambulatory Visit (INDEPENDENT_AMBULATORY_CARE_PROVIDER_SITE_OTHER): Payer: Medicare Other

## 2020-02-07 ENCOUNTER — Other Ambulatory Visit: Payer: Self-pay

## 2020-02-07 DIAGNOSIS — Z7901 Long term (current) use of anticoagulants: Secondary | ICD-10-CM | POA: Diagnosis not present

## 2020-02-07 DIAGNOSIS — I480 Paroxysmal atrial fibrillation: Secondary | ICD-10-CM

## 2020-02-07 LAB — POCT INR: INR: 3.9 — AB (ref 2.0–3.0)

## 2020-02-07 NOTE — Patient Instructions (Addendum)
Pre visit review using our clinic review tool, if applicable. No additional management support is needed unless otherwise documented below in the visit note.  Hold dose today and call nurse with dosing calendar for the last 4 weeks, since last visit on 01/03/20.

## 2020-02-08 NOTE — Progress Notes (Signed)
*Patient ID: Amanda [name], female   DOB: 1920-01-13, 86 y.o.   MRN: [id number] Cardiology Office Note  Date:  02/11/2020   ID:  Amanda [name], DOB May 30, 1919, MRN [id number]  PCP:  Venia Carbon, MD   Chief Complaint  Patient presents with  . Other    12 Month follow up. Patient c/o ankle discoloration. Meds reviewed verbally with patient.     HPI:  Amanda [name] is a pleasant 86 year old woman who has a history of paroxysmal atrial fibrillation,  normal systolic function by echocardiogram in 2007   Holter monitor and April 2007 showing frequent and complex supraventricular arrhythmia,  cardiac CTA showing no significant coronary artery disease with atherosclerotic plaque in the thoracic aorta  managed on sotalol for rhythm control,  pacer placed in May 2007  history of "fainting "prior to that  who presents for routine followup of her atrial fibrillation  Presents with her daughter on today's visit Very hard of hearing Family does everything, she does not get outside the house very much High fall risk At [facility] per the notes  Previously with significant GI issues, periodic constipation with diarrhea  Appears diltiazem held previously by Dr. Roxan Hockey on HCTZ daily Renal function still running high Not eating very much, further weight loss  Periodic tingling and burning in her lower extremities,   pacemaker change out May 2017 for ERI  EKG personally reviewed by myself on todays visit Shows paced rhythm rate 63 bpm  Other past medical history several previous falls  Previous echocardiogram for shortness of breath echo showed mild LVH, diastolic relaxation abnormality, normal ejection fraction, mild to moderate MR, high normal right ventricular systolic pressures Cardiac catheterization in April 2007 showing no significant coronary artery disease  PMH:   has a past medical history of Atrial fibrillation ([place]), Depression, GERD (gastroesophageal reflux  disease), Hyperlipidemia, Hypertension, Hypothyroidism, Mitral regurgitation, Osteoarthritis, Pacemaker, Sleep disturbance, and Urinary incontinence.  PSH:    Past Surgical History:  Procedure Laterality Date  . BREAST BIOPSY  2002  . BREAST LUMPECTOMY  1983  . CATARACT EXTRACTION, BILATERAL    . EP IMPLANTABLE DEVICE N/A 09/02/2015   Procedure: PPM Generator Changeout;  Surgeon: Evans Lance, MD;  Location: [facility];  Service: Cardiovascular;  Laterality: N/A;  . Goiter Removed  1976   cancerous. Then got RAI  . PACEMAKER INSERTION  5/07   after syncope  . TONSILLECTOMY AND ADENOIDECTOMY  1937  . VESICOVAGINAL FISTULA CLOSURE W/ TAH  1972    Current Outpatient Medications  Medication Sig Dispense Refill  . acetaminophen (TYLENOL) 500 MG tablet Take 500 mg by mouth every 8 (eight) hours as needed for mild pain.     . cephALEXin (KEFLEX) 500 MG capsule Take 500 mg by mouth 3 (three) times daily as needed.    . cholecalciferol (VITAMIN D) 1000 UNITS tablet Take 1,000 Units by mouth daily.    . hydrochlorothiazide (HYDRODIURIL) 25 MG tablet TAKE 1 TABLET BY MOUTH  DAILY AS NEEDED 90 tablet 3  . levothyroxine (SYNTHROID) 75 MCG tablet TAKE 1 TABLET BY MOUTH  DAILY 90 tablet 3  . mirtazapine (REMERON) 15 MG tablet TAKE 1 TABLET BY MOUTH AT  BEDTIME 90 tablet 3  . nitroGLYCERIN (NITROSTAT) 0.4 MG SL tablet Place 0.4 mg under the tongue every 5 (five) minutes as needed for chest pain (x 3 doses).     . sotalol (BETAPACE) 120 MG tablet TAKE ONE-HALF TABLET BY  MOUTH TWICE DAILY  90 tablet 3  . trimethoprim (TRIMPEX) 100 MG tablet TAKE 1 TABLET BY MOUTH EVERY DAY 30 tablet 11  . warfarin (COUMADIN) 1 MG tablet TAKE 1 TABLET BY MOUTH  DAILY AS DIRECTED BY  ANTICOAGULATION CLINIC 90 tablet 2  . warfarin (COUMADIN) 4 MG tablet TAKE 1 TABLET BY MOUTH  DAILY OR AS DIRECTED BY  COUMADIN CLINIC 90 tablet 1  . traMADol (ULTRAM) 50 MG tablet TAKE 1/2 TO 1 TABLET BY MOUTH 3 TIMES A DAY AS  NEEDED FOR PAIN 90 tablet 0   No current facility-administered medications for this visit.     Allergies:   Amoxicillin, Augmentin [amoxicillin-pot clavulanate], and Sulfonamide derivatives   Social History:  The patient  reports that she has never smoked. She has never used smokeless tobacco. She reports that she does not drink alcohol and does not use drugs.   Family History:   family history includes Coronary artery disease in her brother; Diabetes in her mother; Prostate cancer in her brother; Stroke in her sister.    Review of Systems: Review of Systems  Constitutional: Negative.   HENT: Negative.   Respiratory: Negative.   Cardiovascular: Negative.   Gastrointestinal: Negative.   Musculoskeletal: Negative.   Neurological: Positive for weakness.  Psychiatric/Behavioral: Negative.   All other systems reviewed and are negative.   PHYSICAL EXAM: VS:  BP 130/60 (BP Location: Right Arm, Patient Position: Sitting, Cuff Size: Normal)   Pulse 63   Ht 5\' 2"  (1.575 m)   Wt 108 lb (49 kg)   SpO2 94%   BMI 19.75 kg/m  , BMI Body mass index is 19.75 kg/m. Constitutional:  oriented to person, place, and time. No distress.  HENT:  Head: Grossly normal Eyes:  no discharge. No scleral icterus.  Neck: No JVD, no carotid bruits  Cardiovascular: Regular rate and rhythm, no murmurs appreciated Pulmonary/Chest: Clear to auscultation bilaterally, no wheezes or rails Abdominal: Soft.  no distension.  no tenderness.  Musculoskeletal: Normal range of motion Neurological:  normal muscle tone. Coordination normal. No atrophy Skin: Skin warm and dry Psychiatric: normal affect, pleasant  Recent Labs: 11/20/2019: ALT 11; BUN 40; Creatinine, Ser 1.83; Hemoglobin 10.4; Platelets 220; Potassium 4.3; Sodium 137    Lipid Panel Lab Results  Component Value Date   CHOL 391 (H) 06/22/2011   HDL 63.80 06/22/2011   TRIG 296.0 (H) 06/22/2011    Wt Readings from Last 3 Encounters:  02/11/20 108  lb (49 kg)  12/28/19 108 lb (49 kg)  12/04/19 108 lb (49 kg)     ASSESSMENT AND PLAN:  Essential hypertension  Continues to take HCTZ daily Given recent weight loss, and renal dysfunction, recommend HCTZ 1/2 pill 12.5 mg daily Not drinking very much fluid  Paroxysmal atrial fibrillation (H[facility]) -  Normal sinus rhythm, paced, on anticoagulation continue sotalol per Dr. Caryl Comes Off diltiazem maintaining normal sinus rhythm Denies any tachycardia palpitations  Chronic diastolic CHF (congestive heart failure) (H[facility]) HCTZ down to 12.5 mg daily, appears euvolemic if not prerenal  Chronic renal disease,  ([place]) Plan as above  PPM-Medtronic Scheduled see Dr. Caryl Comes  Diarrhea Waxing waning symptoms  Tingling leg burning  neuropathy    Orders Placed This Encounter  Procedures  . EKG 12-Lead     Total encounter time more than 25 minutes  Greater than 50% was spent in counseling and coordination of care with the patient   Signed, Esmond Plants, M.D., Ph.D. 02/11/2020  [place], [place]

## 2020-02-11 ENCOUNTER — Encounter: Payer: Self-pay | Admitting: Cardiovascular Disease

## 2020-02-11 ENCOUNTER — Other Ambulatory Visit: Payer: Self-pay | Admitting: Internal Medicine

## 2020-02-11 ENCOUNTER — Other Ambulatory Visit: Payer: Self-pay

## 2020-02-11 ENCOUNTER — Ambulatory Visit (INDEPENDENT_AMBULATORY_CARE_PROVIDER_SITE_OTHER): Payer: Medicare Other | Admitting: Cardiovascular Disease

## 2020-02-11 VITALS — BP 130/60 | HR 63 | Ht 62.0 in | Wt 108.0 lb

## 2020-02-11 DIAGNOSIS — I5032 Chronic diastolic (congestive) heart failure: Secondary | ICD-10-CM

## 2020-02-11 DIAGNOSIS — I480 Paroxysmal atrial fibrillation: Secondary | ICD-10-CM

## 2020-02-11 DIAGNOSIS — I10 Essential (primary) hypertension: Secondary | ICD-10-CM | POA: Diagnosis not present

## 2020-02-11 DIAGNOSIS — Z950 Presence of cardiac pacemaker: Secondary | ICD-10-CM

## 2020-02-11 DIAGNOSIS — I495 Sick sinus syndrome: Secondary | ICD-10-CM | POA: Diagnosis not present

## 2020-02-11 DIAGNOSIS — I700 Atherosclerosis of aorta: Secondary | ICD-10-CM

## 2020-02-11 MED ORDER — HYDROCHLOROTHIAZIDE 12.5 MG PO TABS
12.5000 mg | ORAL_TABLET | Freq: Every day | ORAL | 3 refills | Status: DC | PRN
Start: 2020-02-11 — End: 2020-12-17

## 2020-02-11 NOTE — Patient Instructions (Addendum)
Try the clotrimazole/miconazole on the ankles twice a day   Medication Instructions:  Please decrease the HCTZ to 12.5 mg daily (1/2 of the 25 mg pill)  If you need a refill on your cardiac medications before your next appointment, please call your pharmacy.    Lab work: No new labs needed   If you have labs (blood work) drawn today and your tests are completely normal, you will receive your results only by: Marland Kitchen MyChart Message (if you have MyChart) OR . A paper copy in the mail If you have any lab test that is abnormal or we need to change your treatment, we will call you to review the results.   Testing/Procedures: No new testing needed   Follow-Up: At [facility], you and your health needs are our priority.  As part of our continuing mission to provide you with exceptional heart care, we have created designated Provider Care Teams.  These Care Teams include your primary Cardiologist (physician) and Advanced Practice Providers (APPs -  Physician Assistants and Nurse Practitioners) who all work together to provide you with the care you need, when you need it.  . You will need a follow up appointment in 12 months  . Providers on your designated Care Team:   . Murray Hodgkins, NP . Christell Faith, PA-C . Marrianne Mood, PA-C  Any Other Special Instructions Will Be Listed Below (If Applicable).  COVID-19 Vaccine Information can be found at: ShippingScam.co.uk For questions related to vaccine distribution or appointments, please email vaccine@[email or web address] or call [phone].

## 2020-02-11 NOTE — Telephone Encounter (Signed)
Last filled 11-21-19 #90 Last OV 12-28-19 Next OV 07-02-20 [facility]

## 2020-02-29 DIAGNOSIS — Z23 Encounter for immunization: Secondary | ICD-10-CM | POA: Diagnosis not present

## 2020-03-04 ENCOUNTER — Ambulatory Visit (INDEPENDENT_AMBULATORY_CARE_PROVIDER_SITE_OTHER): Payer: Medicare Other

## 2020-03-04 DIAGNOSIS — I495 Sick sinus syndrome: Secondary | ICD-10-CM

## 2020-03-04 LAB — CUP PACEART REMOTE DEVICE CHECK
Battery Impedance: 428 Ohm
Battery Remaining Longevity: 91 mo
Battery Voltage: 2.79 V
Brady Statistic AP VP Percent: 2 %
Brady Statistic AP VS Percent: 95 %
Brady Statistic AS VP Percent: 0 %
Brady Statistic AS VS Percent: 3 %
Date Time Interrogation Session: 20211116122537
Implantable Lead Implant Date: 20070524
Implantable Lead Implant Date: 20070524
Implantable Lead Location: 753859
Implantable Lead Location: 753860
Implantable Lead Model: 5076
Implantable Lead Model: 5568
Implantable Pulse Generator Implant Date: 20170516
Lead Channel Impedance Value: 459 Ohm
Lead Channel Impedance Value: 702 Ohm
Lead Channel Pacing Threshold Amplitude: 1 V
Lead Channel Pacing Threshold Amplitude: 1 V
Lead Channel Pacing Threshold Pulse Width: 0.4 ms
Lead Channel Pacing Threshold Pulse Width: 0.4 ms
Lead Channel Setting Pacing Amplitude: 2 V
Lead Channel Setting Pacing Amplitude: 2.5 V
Lead Channel Setting Pacing Pulse Width: 0.4 ms
Lead Channel Setting Sensing Sensitivity: 2.8 mV

## 2020-03-06 ENCOUNTER — Other Ambulatory Visit: Payer: Self-pay

## 2020-03-06 ENCOUNTER — Ambulatory Visit (INDEPENDENT_AMBULATORY_CARE_PROVIDER_SITE_OTHER): Payer: Medicare Other

## 2020-03-06 DIAGNOSIS — Z7901 Long term (current) use of anticoagulants: Secondary | ICD-10-CM

## 2020-03-06 LAB — POCT INR: INR: 1.8 — AB (ref 2.0–3.0)

## 2020-03-06 NOTE — Progress Notes (Signed)
Remote pacemaker transmission.   

## 2020-03-06 NOTE — Patient Instructions (Addendum)
Pre visit review using our clinic review tool, if applicable. No additional management support is needed unless otherwise documented below in the visit note.   Increase dose today to 5mg  and then continue taking 3 mg daily except take 1.5mg  on Mond, Wed, and Frid. Return in 3 wks.

## 2020-03-25 ENCOUNTER — Other Ambulatory Visit: Payer: Self-pay

## 2020-03-25 ENCOUNTER — Ambulatory Visit (INDEPENDENT_AMBULATORY_CARE_PROVIDER_SITE_OTHER): Payer: Medicare Other

## 2020-03-25 DIAGNOSIS — Z7901 Long term (current) use of anticoagulants: Secondary | ICD-10-CM

## 2020-03-25 LAB — POCT INR: INR: 2.2 (ref 2.0–3.0)

## 2020-03-25 NOTE — Patient Instructions (Addendum)
Pre visit review using our clinic review tool, if applicable. No additional management support is needed unless otherwise documented below in the visit note.  Continue taking 3 mg daily except take 1.5mg  on Mond, Wed, and Fri. Return in 4 wks.

## 2020-04-22 ENCOUNTER — Other Ambulatory Visit: Payer: Self-pay

## 2020-04-22 ENCOUNTER — Ambulatory Visit (INDEPENDENT_AMBULATORY_CARE_PROVIDER_SITE_OTHER): Payer: Medicare Other

## 2020-04-22 DIAGNOSIS — Z7901 Long term (current) use of anticoagulants: Secondary | ICD-10-CM

## 2020-04-22 LAB — POCT INR: INR: 1.6 — AB (ref 2.0–3.0)

## 2020-04-22 NOTE — Patient Instructions (Addendum)
Pre visit review using our clinic review tool, if applicable. No additional management support is needed unless otherwise documented below in the visit note.  Increase dose today to 6mg  and increase dose tomorrow to 3mg  then continue taking 3 mg daily except take 1.5mg  on Mond, Wed, and Fri. Return in 2 wks.

## 2020-05-02 ENCOUNTER — Other Ambulatory Visit: Payer: Self-pay | Admitting: Internal Medicine

## 2020-05-02 NOTE — Telephone Encounter (Signed)
Last filled 02-11-20 #90 Last OV 02-11-20 Next OV 07-02-20 [facility]

## 2020-05-06 ENCOUNTER — Other Ambulatory Visit: Payer: Self-pay

## 2020-05-06 ENCOUNTER — Ambulatory Visit (INDEPENDENT_AMBULATORY_CARE_PROVIDER_SITE_OTHER): Payer: Medicare Other

## 2020-05-06 DIAGNOSIS — Z7901 Long term (current) use of anticoagulants: Secondary | ICD-10-CM | POA: Diagnosis not present

## 2020-05-06 DIAGNOSIS — I480 Paroxysmal atrial fibrillation: Secondary | ICD-10-CM

## 2020-05-06 LAB — POCT INR: INR: 2.1 (ref 2.0–3.0)

## 2020-05-06 NOTE — Patient Instructions (Addendum)
Pre visit review using our clinic review tool, if applicable. No additional management support is needed unless otherwise documented below in the visit note.  Continue taking 3 mg daily except take 1.5mg  on Mond, Wed, and Fri. Return in 4 wks.

## 2020-05-07 NOTE — Progress Notes (Signed)
Agree. Thanks

## 2020-05-22 DIAGNOSIS — H6123 Impacted cerumen, bilateral: Secondary | ICD-10-CM | POA: Diagnosis not present

## 2020-05-22 DIAGNOSIS — J328 Other chronic sinusitis: Secondary | ICD-10-CM | POA: Diagnosis not present

## 2020-05-22 DIAGNOSIS — H90A22 Sensorineural hearing loss, unilateral, left ear, with restricted hearing on the contralateral side: Secondary | ICD-10-CM | POA: Diagnosis not present

## 2020-06-03 ENCOUNTER — Ambulatory Visit: Payer: Medicare Other

## 2020-06-04 ENCOUNTER — Telehealth: Payer: Self-pay

## 2020-06-04 NOTE — Telephone Encounter (Signed)
LMOVM for pt to give us a call back. 

## 2020-06-10 ENCOUNTER — Ambulatory Visit (INDEPENDENT_AMBULATORY_CARE_PROVIDER_SITE_OTHER): Payer: Medicare Other

## 2020-06-10 ENCOUNTER — Other Ambulatory Visit: Payer: Self-pay

## 2020-06-10 DIAGNOSIS — Z7901 Long term (current) use of anticoagulants: Secondary | ICD-10-CM | POA: Diagnosis not present

## 2020-06-10 LAB — POCT INR: INR: 2.4 (ref 2.0–3.0)

## 2020-06-10 NOTE — Patient Instructions (Addendum)
Pre visit review using our clinic review tool, if applicable. No additional management support is needed unless otherwise documented below in the visit note.  Continue taking 3 mg daily except take 1.5mg  on Mond, Wed, and Fri. Return in 4 wks.

## 2020-06-12 ENCOUNTER — Other Ambulatory Visit: Payer: Self-pay | Admitting: Internal Medicine

## 2020-06-12 DIAGNOSIS — Z7901 Long term (current) use of anticoagulants: Secondary | ICD-10-CM

## 2020-06-14 NOTE — Telephone Encounter (Signed)
Pt compliant with coumadin management. Sent in refill. 

## 2020-06-20 ENCOUNTER — Other Ambulatory Visit: Payer: Self-pay | Admitting: Internal Medicine

## 2020-07-02 ENCOUNTER — Ambulatory Visit (INDEPENDENT_AMBULATORY_CARE_PROVIDER_SITE_OTHER): Payer: Medicare Other | Admitting: Internal Medicine

## 2020-07-02 ENCOUNTER — Encounter: Payer: Self-pay | Admitting: Internal Medicine

## 2020-07-02 ENCOUNTER — Other Ambulatory Visit: Payer: Self-pay

## 2020-07-02 VITALS — BP 108/62 | HR 71 | Temp 97.2°F | Ht 62.0 in | Wt 106.0 lb

## 2020-07-02 DIAGNOSIS — I480 Paroxysmal atrial fibrillation: Secondary | ICD-10-CM

## 2020-07-02 DIAGNOSIS — E441 Mild protein-calorie malnutrition: Secondary | ICD-10-CM | POA: Diagnosis not present

## 2020-07-02 DIAGNOSIS — R5383 Other fatigue: Secondary | ICD-10-CM | POA: Diagnosis not present

## 2020-07-02 DIAGNOSIS — N184 Chronic kidney disease, stage 4 (severe): Secondary | ICD-10-CM

## 2020-07-02 DIAGNOSIS — F39 Unspecified mood [affective] disorder: Secondary | ICD-10-CM | POA: Diagnosis not present

## 2020-07-02 DIAGNOSIS — I5032 Chronic diastolic (congestive) heart failure: Secondary | ICD-10-CM

## 2020-07-02 LAB — HEPATIC FUNCTION PANEL
ALT: 7 U/L (ref 0–35)
AST: 18 U/L (ref 0–37)
Albumin: 3.9 g/dL (ref 3.5–5.2)
Alkaline Phosphatase: 61 U/L (ref 39–117)
Bilirubin, Direct: 0.1 mg/dL (ref 0.0–0.3)
Total Bilirubin: 0.4 mg/dL (ref 0.2–1.2)
Total Protein: 6.7 g/dL (ref 6.0–8.3)

## 2020-07-02 LAB — RENAL FUNCTION PANEL
Albumin: 3.9 g/dL (ref 3.5–5.2)
BUN: 39 mg/dL — ABNORMAL HIGH (ref 6–23)
CO2: 31 mEq/L (ref 19–32)
Calcium: 10.2 mg/dL (ref 8.4–10.5)
Chloride: 101 mEq/L (ref 96–112)
Creatinine, Ser: 2.29 mg/dL — ABNORMAL HIGH (ref 0.40–1.20)
GFR: 17.37 mL/min — ABNORMAL LOW (ref >60.00)
Glucose, Bld: 106 mg/dL — ABNORMAL HIGH (ref 70–99)
Phosphorus: 3.7 mg/dL (ref 2.3–4.6)
Potassium: 4.7 mEq/L (ref 3.5–5.1)
Sodium: 139 mEq/L (ref 135–145)

## 2020-07-02 LAB — CBC
HCT: 31.4 % — ABNORMAL LOW (ref 36.0–46.0)
Hemoglobin: 10.7 g/dL — ABNORMAL LOW (ref 12.0–15.0)
MCHC: 34.1 g/dL (ref 30.0–36.0)
MCV: 85.9 fl (ref 78.0–100.0)
Platelets: 191 10*3/uL (ref 150.0–400.0)
RBC: 3.66 Mil/uL — ABNORMAL LOW (ref 3.87–5.11)
RDW: 14.7 % (ref 11.5–15.5)
WBC: 6 10*3/uL (ref 4.0–10.5)

## 2020-07-02 LAB — T4, FREE: Free T4: 1.2 ng/dL (ref 0.60–1.60)

## 2020-07-02 MED ORDER — APIXABAN 2.5 MG PO TABS: 2.5000 mg | ORAL_TABLET | Freq: Two times a day (BID) | ORAL | 3 refills | Status: DC

## 2020-07-02 NOTE — Assessment & Plan Note (Signed)
Compensated No fluid overload Likely mostly chronotropic related

## 2020-07-02 NOTE — Assessment & Plan Note (Signed)
Will recheck labs as this could worsen energy levels

## 2020-07-02 NOTE — Assessment & Plan Note (Signed)
Seems to have low burden on sotalol Discussed safety of eliquis---limited data but suggestion that it is safer than warfarin----will switch over

## 2020-07-02 NOTE — Assessment & Plan Note (Signed)
May improve if able to be out more and with other people

## 2020-07-02 NOTE — Progress Notes (Signed)
Subjective:    Patient ID: Amanda [name], female    DOB: 1920-01-10, 87 y.o.   MRN: [id number]  HPI Here with daughter Amanda [name] for follow up of chronic health conditions This visit occurred during the SARS-CoV-2 public health emergency.  Safety protocols were in place, including screening questions prior to the visit, additional usage of staff PPE, and extensive cleaning of exam room while observing appropriate contact time as indicated for disinfecting solutions.   She doesn't feel good  Weak at times--much of the time Daily tasks are harder---but still does laundry, some cooking (mostly prepared though) Still lives independently  Other daughter comes from [place] for a couple of nights a month now  Some ongoing pain Tramadol helps  No chest pain No SOB---but occasionally "strangled" on pills (but not food) Not aware of heart--rare palpitations Tired of coumadin, etc---discussed options  No edema or fluid retention  Some depression Not really daily Not anhedonic---enjoys getting out, seeing family, etc  Last GFR 23  Current Outpatient Medications on File Prior to Visit  Medication Sig Dispense Refill  . acetaminophen (TYLENOL) 500 MG tablet Take 500 mg by mouth every 8 (eight) hours as needed for mild pain.    . cephALEXin (KEFLEX) 500 MG capsule Take 500 mg by mouth 3 (three) times daily as needed.    . cholecalciferol (VITAMIN D) 1000 UNITS tablet Take 1,000 Units by mouth daily.    . hydrochlorothiazide (HYDRODIURIL) 12.5 MG tablet Take 1 tablet (12.5 mg total) by mouth daily as needed. 90 tablet 3  . levothyroxine (SYNTHROID) 75 MCG tablet TAKE 1 TABLET BY MOUTH  DAILY 90 tablet 3  . mirtazapine (REMERON) 15 MG tablet TAKE 1 TABLET BY MOUTH AT  BEDTIME 90 tablet 3  . nitroGLYCERIN (NITROSTAT) 0.4 MG SL tablet Place 0.4 mg under the tongue every 5 (five) minutes as needed for chest pain (x 3 doses).    . sotalol (BETAPACE) 120 MG tablet TAKE ONE-HALF TABLET BY   MOUTH TWICE DAILY 90 tablet 3  . traMADol (ULTRAM) 50 MG tablet TAKE 1/2 TO 1 TABLET BY MOUTH 3 TIMES A DAY AS NEEDED FOR PAIN 90 tablet 0  . trimethoprim (TRIMPEX) 100 MG tablet TAKE 1 TABLET BY MOUTH EVERY DAY 30 tablet 11  . warfarin (COUMADIN) 1 MG tablet TAKE 1 TABLET BY MOUTH  DAILY AS DIRECTED BY  ANTICOAGULATION CLINIC 90 tablet 3  . warfarin (COUMADIN) 4 MG tablet TAKE 1 TABLET BY MOUTH  DAILY OR AS DIRECTED BY  COUMADIN CLINIC 90 tablet 1   No current facility-administered medications on file prior to visit.    Allergies  Allergen Reactions  . Amoxicillin Diarrhea    Weakness and atrial fib  . Augmentin [Amoxicillin-Pot Clavulanate] Diarrhea    Weakness and atrial fib  . Sulfonamide Derivatives     unknown    Past Medical History:  Diagnosis Date  . Atrial fibrillation ([place])   . Depression   . GERD (gastroesophageal reflux disease)   . Hyperlipidemia   . Hypertension   . Hypothyroidism    after Rx for thyroid cancer  . Mitral regurgitation   . Osteoarthritis   . Pacemaker   . Sleep disturbance   . Urinary incontinence    usually from UTI's    Past Surgical History:  Procedure Laterality Date  . BREAST BIOPSY  2002  . BREAST LUMPECTOMY  1983  . CATARACT EXTRACTION, BILATERAL    . EP IMPLANTABLE DEVICE N/A 09/02/2015   Procedure: PPM  Nature conservation officer;  Surgeon: Evans Lance, MD;  Location: [place] [facility];  Service: Cardiovascular;  Laterality: N/A;  . Goiter Removed  1976   cancerous. Then got RAI  . PACEMAKER INSERTION  5/07   after syncope  . TONSILLECTOMY AND ADENOIDECTOMY  1937  . VESICOVAGINAL FISTULA CLOSURE W/ TAH  1972    Family History  Problem Relation Age of Onset  . Diabetes Mother   . Stroke Sister   . Coronary artery disease Brother   . Prostate cancer Brother   . Breast cancer Neg Hx   . Colon cancer Neg Hx     Social History   Socioeconomic History  . Marital status: Widowed    Spouse name: Not on file  . Number of  children: 2  . Years of education: Not on file  . Highest education level: Not on file  Occupational History  . Occupation: homemaker  Tobacco Use  . Smoking status: Never Smoker  . Smokeless tobacco: Never Used  Substance and Sexual Activity  . Alcohol use: No  . Drug use: No  . Sexual activity: Not on file  Other Topics Concern  . Not on file  Social History Narrative   Widowed 2002. 1 local daughter. Other in [place]. Was homemaker. Farmed and gardened.    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    Daughter Amanda [name]) is health care POA.    Discussed DNR and she requests    Would not want tube feeds if cognitively unaware   Social Determinants of Health   Financial Resource Strain: Not on file  Food Insecurity: Not on file  Transportation Needs: Not on file  Physical Activity: Not on file  Stress: Not on file  Social Connections: Not on file  Intimate Partner Violence: Not on file   Review of Systems Appetite is fair Weight down slightly Sleeps okay Occasional urinary burning--cephalexin helps    Objective:   Physical Exam Constitutional:      Appearance: Normal appearance.  Cardiovascular:     Rate and Rhythm: Normal rate and regular rhythm.     Heart sounds: No murmur heard. No gallop.   Pulmonary:     Effort: Pulmonary effort is normal.     Breath sounds: Normal breath sounds. No wheezing or rales.  Musculoskeletal:     Cervical back: Neck supple.     Right lower leg: No edema.     Left lower leg: No edema.  Lymphadenopathy:     Cervical: No cervical adenopathy.  Neurological:     Mental Status: She is alert.  Psychiatric:        Mood and Affect: Mood normal.        Behavior: Behavior normal.            Assessment & Plan:

## 2020-07-02 NOTE — Assessment & Plan Note (Signed)
Non specific Some isolation--discussed that it is fairly safe again to get out Will recheck labs No exacerbation of CHF

## 2020-07-02 NOTE — Assessment & Plan Note (Signed)
Dysthymia worsened by COVID No meds for this other than the mirtazapine

## 2020-07-10 ENCOUNTER — Ambulatory Visit: Payer: Medicare Other

## 2020-07-14 ENCOUNTER — Ambulatory Visit: Payer: Self-pay

## 2020-07-14 NOTE — Progress Notes (Signed)
Patient was switched to Eliquis by Dr. Silvio Pate

## 2020-07-28 ENCOUNTER — Other Ambulatory Visit: Payer: Self-pay | Admitting: Internal Medicine

## 2020-07-28 NOTE — Telephone Encounter (Signed)
Last filled 05-04-20 #90 Last OV 07-02-20  Next OV 12-31-20 [facility]

## 2020-09-10 ENCOUNTER — Ambulatory Visit (INDEPENDENT_AMBULATORY_CARE_PROVIDER_SITE_OTHER): Payer: Medicare Other

## 2020-09-10 DIAGNOSIS — I495 Sick sinus syndrome: Secondary | ICD-10-CM | POA: Diagnosis not present

## 2020-09-12 LAB — CUP PACEART REMOTE DEVICE CHECK
Battery Impedance: 555 Ohm
Battery Remaining Longevity: 82 mo
Battery Voltage: 2.79 V
Brady Statistic AP VP Percent: 1 %
Brady Statistic AP VS Percent: 97 %
Brady Statistic AS VP Percent: 0 %
Brady Statistic AS VS Percent: 2 %
Date Time Interrogation Session: 20220525111345
Implantable Lead Implant Date: 20070524
Implantable Lead Implant Date: 20070524
Implantable Lead Location: 753859
Implantable Lead Location: 753860
Implantable Lead Model: 5076
Implantable Lead Model: 5568
Implantable Pulse Generator Implant Date: 20170516
Lead Channel Impedance Value: 462 Ohm
Lead Channel Impedance Value: 702 Ohm
Lead Channel Pacing Threshold Amplitude: 0.75 V
Lead Channel Pacing Threshold Amplitude: 1 V
Lead Channel Pacing Threshold Pulse Width: 0.4 ms
Lead Channel Pacing Threshold Pulse Width: 0.4 ms
Lead Channel Setting Pacing Amplitude: 2 V
Lead Channel Setting Pacing Amplitude: 2.5 V
Lead Channel Setting Pacing Pulse Width: 0.4 ms
Lead Channel Setting Sensing Sensitivity: 2 mV

## 2020-09-17 ENCOUNTER — Other Ambulatory Visit: Payer: Self-pay | Admitting: Internal Medicine

## 2020-10-06 NOTE — Progress Notes (Signed)
Remote pacemaker transmission.   

## 2020-10-16 ENCOUNTER — Other Ambulatory Visit: Payer: Self-pay | Admitting: Internal Medicine

## 2020-10-16 NOTE — Telephone Encounter (Signed)
Last filled 07-28-20 #90 Last OV 07-02-20 Next OV 12-31-20 [facility]

## 2020-11-12 DIAGNOSIS — I872 Venous insufficiency (chronic) (peripheral): Secondary | ICD-10-CM | POA: Diagnosis not present

## 2020-11-12 DIAGNOSIS — L820 Inflamed seborrheic keratosis: Secondary | ICD-10-CM | POA: Diagnosis not present

## 2020-11-20 DIAGNOSIS — J328 Other chronic sinusitis: Secondary | ICD-10-CM | POA: Diagnosis not present

## 2020-11-20 DIAGNOSIS — H90A22 Sensorineural hearing loss, unilateral, left ear, with restricted hearing on the contralateral side: Secondary | ICD-10-CM | POA: Diagnosis not present

## 2020-11-20 DIAGNOSIS — H903 Sensorineural hearing loss, bilateral: Secondary | ICD-10-CM | POA: Diagnosis not present

## 2020-11-20 DIAGNOSIS — H6123 Impacted cerumen, bilateral: Secondary | ICD-10-CM | POA: Diagnosis not present

## 2020-12-10 ENCOUNTER — Ambulatory Visit (INDEPENDENT_AMBULATORY_CARE_PROVIDER_SITE_OTHER): Payer: Medicare Other

## 2020-12-10 DIAGNOSIS — I495 Sick sinus syndrome: Secondary | ICD-10-CM | POA: Diagnosis not present

## 2020-12-10 LAB — CUP PACEART REMOTE DEVICE CHECK
Battery Impedance: 606 Ohm
Battery Remaining Longevity: 77 mo
Battery Voltage: 2.78 V
Brady Statistic AP VP Percent: 1 %
Brady Statistic AP VS Percent: 97 %
Brady Statistic AS VP Percent: 0 %
Brady Statistic AS VS Percent: 2 %
Date Time Interrogation Session: 20220824115202
Implantable Lead Implant Date: 20070524
Implantable Lead Implant Date: 20070524
Implantable Lead Location: 753859
Implantable Lead Location: 753860
Implantable Lead Model: 5076
Implantable Lead Model: 5568
Implantable Pulse Generator Implant Date: 20170516
Lead Channel Impedance Value: 457 Ohm
Lead Channel Impedance Value: 689 Ohm
Lead Channel Pacing Threshold Amplitude: 0.75 V
Lead Channel Pacing Threshold Amplitude: 1.125 V
Lead Channel Pacing Threshold Pulse Width: 0.4 ms
Lead Channel Pacing Threshold Pulse Width: 0.4 ms
Lead Channel Setting Pacing Amplitude: 2.25 V
Lead Channel Setting Pacing Amplitude: 2.5 V
Lead Channel Setting Pacing Pulse Width: 0.4 ms
Lead Channel Setting Sensing Sensitivity: 2 mV

## 2020-12-17 ENCOUNTER — Other Ambulatory Visit: Payer: Self-pay | Admitting: Internal Medicine

## 2020-12-17 ENCOUNTER — Other Ambulatory Visit: Payer: Self-pay | Admitting: Cardiovascular Disease

## 2020-12-25 NOTE — Progress Notes (Signed)
Remote pacemaker transmission.   

## 2020-12-31 ENCOUNTER — Other Ambulatory Visit: Payer: Self-pay

## 2020-12-31 ENCOUNTER — Encounter: Payer: Self-pay | Admitting: Internal Medicine

## 2020-12-31 ENCOUNTER — Ambulatory Visit (INDEPENDENT_AMBULATORY_CARE_PROVIDER_SITE_OTHER): Payer: Medicare Other | Admitting: Internal Medicine

## 2020-12-31 VITALS — BP 124/80 | HR 62 | Temp 97.7°F | Ht 62.5 in | Wt 104.0 lb

## 2020-12-31 DIAGNOSIS — I700 Atherosclerosis of aorta: Secondary | ICD-10-CM | POA: Diagnosis not present

## 2020-12-31 DIAGNOSIS — I480 Paroxysmal atrial fibrillation: Secondary | ICD-10-CM

## 2020-12-31 DIAGNOSIS — Z7189 Other specified counseling: Secondary | ICD-10-CM

## 2020-12-31 DIAGNOSIS — N184 Chronic kidney disease, stage 4 (severe): Secondary | ICD-10-CM | POA: Diagnosis not present

## 2020-12-31 DIAGNOSIS — I5032 Chronic diastolic (congestive) heart failure: Secondary | ICD-10-CM

## 2020-12-31 DIAGNOSIS — N2581 Secondary hyperparathyroidism of renal origin: Secondary | ICD-10-CM

## 2020-12-31 DIAGNOSIS — Z0000 Encounter for general adult medical examination without abnormal findings: Secondary | ICD-10-CM | POA: Diagnosis not present

## 2020-12-31 DIAGNOSIS — E441 Mild protein-calorie malnutrition: Secondary | ICD-10-CM

## 2020-12-31 DIAGNOSIS — Z23 Encounter for immunization: Secondary | ICD-10-CM

## 2020-12-31 DIAGNOSIS — F39 Unspecified mood [affective] disorder: Secondary | ICD-10-CM | POA: Diagnosis not present

## 2020-12-31 LAB — CBC
HCT: 29 % — ABNORMAL LOW (ref 36.0–46.0)
Hemoglobin: 9.7 g/dL — ABNORMAL LOW (ref 12.0–15.0)
MCHC: 33.3 g/dL (ref 30.0–36.0)
MCV: 88.1 fl (ref 78.0–100.0)
Platelets: 214 10*3/uL (ref 150.0–400.0)
RBC: 3.29 Mil/uL — ABNORMAL LOW (ref 3.87–5.11)
RDW: 13.9 % (ref 11.5–15.5)
WBC: 5 10*3/uL (ref 4.0–10.5)

## 2020-12-31 LAB — RENAL FUNCTION PANEL
Albumin: 3.8 g/dL (ref 3.5–5.2)
BUN: 41 mg/dL — ABNORMAL HIGH (ref 6–23)
CO2: 30 mEq/L (ref 19–32)
Calcium: 10.2 mg/dL (ref 8.4–10.5)
Chloride: 101 mEq/L (ref 96–112)
Creatinine, Ser: 2.01 mg/dL — ABNORMAL HIGH (ref 0.40–1.20)
GFR: 20.24 mL/min — ABNORMAL LOW (ref >60.00)
Glucose, Bld: 82 mg/dL (ref 70–99)
Phosphorus: 3.4 mg/dL (ref 2.3–4.6)
Potassium: 4 mEq/L (ref 3.5–5.1)
Sodium: 138 mEq/L (ref 135–145)

## 2020-12-31 LAB — HEPATIC FUNCTION PANEL
ALT: 8 U/L (ref 0–35)
AST: 17 U/L (ref 0–37)
Albumin: 3.8 g/dL (ref 3.5–5.2)
Alkaline Phosphatase: 64 U/L (ref 39–117)
Bilirubin, Direct: 0.1 mg/dL (ref 0.0–0.3)
Total Bilirubin: 0.4 mg/dL (ref 0.2–1.2)
Total Protein: 6.6 g/dL (ref 6.0–8.3)

## 2020-12-31 LAB — VITAMIN D 25 HYDROXY (VIT D DEFICIENCY, FRACTURES): VITD: 100.66 ng/mL — ABNORMAL HIGH (ref 30.00–100.00)

## 2020-12-31 LAB — TSH: TSH: 0.59 u[IU]/mL (ref 0.35–5.50)

## 2020-12-31 LAB — T4, FREE: Free T4: 1.15 ng/dL (ref 0.60–1.60)

## 2020-12-31 MED ORDER — CEPHALEXIN 500 MG PO CAPS: 500.0000 mg | ORAL_CAPSULE | Freq: Three times a day (TID) | ORAL | 2 refills | Status: DC | PRN

## 2020-12-31 NOTE — Assessment & Plan Note (Signed)
Last GFR 17 No uremic symptoms or fluid issues No action due to age

## 2020-12-31 NOTE — Assessment & Plan Note (Signed)
Is on vitamin D 

## 2020-12-31 NOTE — Assessment & Plan Note (Signed)
Regular now on sotalol Doing well on the eliquis

## 2020-12-31 NOTE — Assessment & Plan Note (Signed)
I have personally reviewed the Medicare Annual Wellness questionnaire and have noted 1. The patient's medical and social history 2. Their use of alcohol, tobacco or illicit drugs 3. Their current medications and supplements 4. The patient's functional ability including ADL's, fall risks, home safety risks and hearing or visual             impairment. 5. Diet and physical activities 6. Evidence for depression or mood disorders  The patients weight, height, BMI and visual acuity have been recorded in the chart I have made referrals, counseling and provided education to the patient based review of the above and I have provided the pt with a written personalized care plan for preventive services.  I have provided you with a copy of your personalized plan for preventive services. Please take the time to review along with your updated medication list.  Flu vaccine today Bivalent COVID next week Not really able to exercise No cancer screening due to age

## 2020-12-31 NOTE — Assessment & Plan Note (Signed)
On imaging No action due to age

## 2020-12-31 NOTE — Progress Notes (Signed)
Subjective:    Patient ID: Amanda [name], female    DOB: [birth year], 87 y.o.   MRN: [id number]  HPI Here with daughter for Medicare wellness visit and follow up of chronic health conditions This visit occurred during the SARS-CoV-2 public health emergency.  Safety protocols were in place, including screening questions prior to the visit, additional usage of staff PPE, and extensive cleaning of exam room while observing appropriate contact time as indicated for disinfecting solutions.   Reviewed advanced directives Reviewed other doctors-- Dr Venetia Constable, Dr Gollan/Klein---cardiologist, Dr Virgel Paling No hospitalizations or surgery in past year No tobacco or alcohol Trouble with vision in right eye (had stroke there)--doesn't want to go back to eye doctor Hearing aides LIves in her own place still. Daughter brings in food, but she cooks some of the time. Does some light housework--daughter helps with vacuuming, etc.  Walks with walker--cane when out No falls this year No sig memory issues  Doing well on the apixaban---some price issues No palpitations No SOB Will have funny feeling or pain in chest if lying down---?indigestion (tums helps) No dysphagia Sleeps flat and no PND Mild edema at times---actually less than in past  Still has some down times Only intermittently  "I feel useless" Not much that she enjoys----does watch TV, enjoys family get togethers, going out with daughter for lunch/visiting  Ongoing pain issues Uses the tramadol only at night  No sig UTI on the trimethoprim Does occasionally get dysuria need the cephalexin (2-3 doses and it resolves)  Last GFR 17---this is stable  Current Outpatient Medications on File Prior to Visit  Medication Sig Dispense Refill   acetaminophen (TYLENOL) 500 MG tablet Take 500 mg by mouth every 8 (eight) hours as needed for mild pain.     apixaban (ELIQUIS) 2.5 MG TABS tablet Take 1 tablet (2.5 mg total) by mouth 2 (two)  times daily. 180 tablet 3   cephALEXin (KEFLEX) 500 MG capsule Take 500 mg by mouth 3 (three) times daily as needed.     cholecalciferol (VITAMIN D) 1000 UNITS tablet Take 1,000 Units by mouth daily.     hydrochlorothiazide (HYDRODIURIL) 12.5 MG tablet TAKE 1 TABLET BY MOUTH  DAILY AS NEEDED 90 tablet 0   levothyroxine (SYNTHROID) 75 MCG tablet TAKE 1 TABLET BY MOUTH  DAILY 90 tablet 0   mirtazapine (REMERON) 15 MG tablet TAKE 1 TABLET BY MOUTH AT  BEDTIME 90 tablet 3   nitroGLYCERIN (NITROSTAT) 0.4 MG SL tablet Place 0.4 mg under the tongue every 5 (five) minutes as needed for chest pain (x 3 doses).     sotalol (BETAPACE) 120 MG tablet TAKE ONE-HALF TABLET BY  MOUTH TWICE DAILY 90 tablet 0   traMADol (ULTRAM) 50 MG tablet TAKE 1/2 TO 1 TABLET BY MOUTH 3 TIMES A DAY AS NEEDED FOR PAIN 90 tablet 0   trimethoprim (TRIMPEX) 100 MG tablet TAKE 1 TABLET BY MOUTH  DAILY 90 tablet 1   No current facility-administered medications on file prior to visit.    Allergies  Allergen Reactions   Amoxicillin Diarrhea    Weakness and atrial fib   Augmentin [Amoxicillin-Pot Clavulanate] Diarrhea    Weakness and atrial fib   Sulfonamide Derivatives     unknown    Past Medical History:  Diagnosis Date   Atrial fibrillation ([facility])    Depression    GERD (gastroesophageal reflux disease)    Hyperlipidemia    Hypertension    Hypothyroidism    after Rx for thyroid  cancer   Mitral regurgitation    Osteoarthritis    Pacemaker    Sleep disturbance    Urinary incontinence    usually from UTI's    Past Surgical History:  Procedure Laterality Date   BREAST BIOPSY  2002   BREAST LUMPECTOMY  1983   CATARACT EXTRACTION, BILATERAL     EP IMPLANTABLE DEVICE N/A 09/02/2015   Procedure: PPM Generator Changeout;  Surgeon: Evans Lance, MD;  Location: [place] [facility];  Service: Cardiovascular;  Laterality: N/A;   Goiter Removed  1976   cancerous. Then got RAI   PACEMAKER INSERTION  5/07   after syncope    TONSILLECTOMY AND ADENOIDECTOMY  1937   VESICOVAGINAL FISTULA CLOSURE W/ TAH  1972    Family History  Problem Relation Age of Onset   Diabetes Mother    Stroke Sister    Coronary artery disease Brother    Prostate cancer Brother    Breast cancer Neg Hx    Colon cancer Neg Hx     Social History   Socioeconomic History   Marital status: Widowed    Spouse name: Not on file   Number of children: 2   Years of education: Not on file   Highest education level: Not on file  Occupational History   Occupation: homemaker  Tobacco Use   Smoking status: Never   Smokeless tobacco: Never  Substance and Sexual Activity   Alcohol use: No   Drug use: No   Sexual activity: Not on file  Other Topics Concern   Not on file  Social History Narrative   Widowed 2002. 1 local daughter. 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 Was homemaker. Farmed and gardened.    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    Daughter [name]) is health care POA.    Discussed DNR and she requests    Would not want tube feeds if cognitively unaware   Social Determinants of Health   Financial Resource Strain: Not on file  Food Insecurity: Not on file  Transportation Needs: Not on file  Physical Activity: Not on file  Stress: Not on file  Social Connections: Not on file  Intimate Partner Violence: Not on file   Review of Systems Appetite is okay Weight is down slightly---still on the mirtazapine Not drinking much of the boost Full dentures--no dentist Bowels are slow---prunes do help. No blood No sig back or joint pains No new skin issues--just saw the dermatologist    Objective:   Physical Exam Constitutional:      Appearance: Normal appearance.  HENT:     Mouth/Throat:     Comments: No lesions Eyes:     Conjunctiva/sclera: Conjunctivae normal.     Comments: ?left exotropia  Cardiovascular:     Rate and Rhythm: Normal rate and regular rhythm.     Heart sounds:    No gallop.     Comments: Gr 2/6 coarse systolic  murmur--base/LSB Faint pedal pulses Pulmonary:     Effort: Pulmonary effort is normal.     Breath sounds: Normal breath sounds. No wheezing or rales.  Abdominal:     Palpations: Abdomen is soft.     Tenderness: There is no abdominal tenderness.  Musculoskeletal:     Cervical back: Neck supple.  Lymphadenopathy:     Cervical: No cervical adenopathy.  Skin:    General: Skin is warm.     Findings: No rash.  Neurological:     Mental Status: She is alert and oriented to person, place, and time.  Comments: President--- "Zoila Shutter, Obama" [phone] [id number] Recall 2/3  Psychiatric:        Mood and Affect: Mood normal.        Behavior: Behavior normal.           Assessment & Plan:

## 2020-12-31 NOTE — Assessment & Plan Note (Signed)
Weight is down some  Discussed adding more boost

## 2020-12-31 NOTE — Assessment & Plan Note (Signed)
Dysthymia mostly Not MDD Discussed medications ---- risks >> benefits

## 2020-12-31 NOTE — Assessment & Plan Note (Signed)
Has DNR 

## 2020-12-31 NOTE — Assessment & Plan Note (Signed)
Compensated Weight is down some--no lasix

## 2020-12-31 NOTE — Addendum Note (Signed)
Addended by: Pilar Grammes on: 12/31/2020 03:11 PM   Modules accepted: Orders

## 2020-12-31 NOTE — Progress Notes (Signed)
Hearing Screening - Comments:: Hearing aids. Wearing them today Vision Screening - Comments:: Visually impaired due to stroke in eye. Eye doctor says no reason to follow-up with them.

## 2021-01-01 LAB — PARATHYROID HORMONE, INTACT (NO CA): PTH: 27 pg/mL (ref 16–77)

## 2021-01-08 DIAGNOSIS — Z23 Encounter for immunization: Secondary | ICD-10-CM | POA: Diagnosis not present

## 2021-01-13 ENCOUNTER — Other Ambulatory Visit: Payer: Self-pay | Admitting: Internal Medicine

## 2021-01-13 NOTE — Telephone Encounter (Signed)
Last filled 10-16-20 #90 Last OV 12-31-20 Next OV 07-01-21 [facility]

## 2021-01-20 ENCOUNTER — Telehealth (INDEPENDENT_AMBULATORY_CARE_PROVIDER_SITE_OTHER): Payer: Medicare Other | Admitting: Family Medicine

## 2021-01-20 ENCOUNTER — Encounter: Payer: Self-pay | Admitting: Family Medicine

## 2021-01-20 DIAGNOSIS — U071 COVID-19: Secondary | ICD-10-CM | POA: Diagnosis not present

## 2021-01-20 MED ORDER — MOLNUPIRAVIR EUA 200MG CAPSULE: 4.0000 | ORAL_CAPSULE | Freq: Two times a day (BID) | ORAL | 0 refills | Status: AC

## 2021-01-20 NOTE — Patient Instructions (Addendum)
Take the molnupiravir as directed Drink fluids Tylenol will help fever/chills/aches and sore throat  If short of breath or any severe symptoms please go to the ER Otherwise call the office if not improving or new symptoms  Continue to isolate at minimum 5 days , until symptoms improve considerably

## 2021-01-20 NOTE — Progress Notes (Signed)
Virtual Visit via Video Note  I connected with Amanda [name] on 01/20/21 at  3:30 PM EDT by a video enabled telemedicine application and verified that I am speaking with the correct person using two identifiers.  Location: Patient: home Provider: office   I discussed the limitations of evaluation and management by telemedicine and the availability of in person appointments. The patient expressed understanding and agreed to proceed.  Parties involved in encounter  Patient: Amanda [name]   Provider:  Loura Pardon MD  Daughter: Aliene Beams  History of Present Illness: 87 yo pt of Dr Silvio Pate presents for positive covid test  She has a hx of a fib and CHF as well as CKD stage 4   02 sat is 97% on RA today Daughter gives some hx  She tested positive this am Did not feel well yesterday  Had a ST   Some chills and aches  Temp was 99 early this am   Sneezing/congestion   Cough -is dry /not productive yet  No shortness of breath (just throat pain to breathe) Is able to swallow liquids -chicken soup for lunch   No n/v/d   Ears hurt a bit-used drops  Mild headache   No otc med Takes tramadol at night  Has some tylenol    Immunized for covid  4th shot 10 d ago   Renal dz Lab Results  Component Value Date   CREATININE 2.01 (H) 12/31/2020   BUN 41 (H) 12/31/2020   NA 138 12/31/2020   K 4.0 12/31/2020   CL 101 12/31/2020   CO2 30 12/31/2020   Patient Active Problem List   Diagnosis Date Noted   COVID-19 01/20/2021   Fatigue 07/02/2020   Malnutrition of mild degree ([place]) 12/28/2019   Left foot pain 10/12/2019   Acute cystitis 01/05/2019   Secondary hyperparathyroidism of renal origin ([place]) 02/14/2018   Advance directive discussed with patient 08/16/2017   Long term (current) use of anticoagulants 04/07/2017   Inguinal hernia 12/21/2016   Neuropathy 10/08/2016   Pedal edema 09/14/2016   Paroxysmal atrial fibrillation ([place]) 04/20/2016   Anemia of chronic renal  failure, stage 4 (severe) ([place]) 02/05/2015   Recurrent cystitis 02/25/2014   Routine general medical examination at a health care facility 07/16/2013   Thoracic aorta atherosclerosis ([place]) 07/16/2013   Encounter for therapeutic drug monitoring 06/11/2013   Chronic diastolic CHF (congestive heart failure) ([place]) 05/29/2013   Chronic renal disease, stage IV ([place]) 12/16/2011   GERD (gastroesophageal reflux disease)    SICK SINUS/ TACHY-BRADY SYNDROME 12/15/2009   PPM-Medtronic 12/15/2009   Mitral valve disorder 12/08/2009   Episodic mood disorder ([place]) 05/05/2009   HEARING LOSS 04/25/2009   Hypothyroidism 12/27/2008   Hyperlipidemia 12/27/2008   Essential hypertension 12/27/2008   OSTEOARTHRITIS 12/27/2008   RLS (restless legs syndrome) 12/27/2008   URINARY INCONTINENCE 12/27/2008   Past Medical History:  Diagnosis Date   Atrial fibrillation ([place])    Depression    GERD (gastroesophageal reflux disease)    Hyperlipidemia    Hypertension    Hypothyroidism    after Rx for thyroid cancer   Mitral regurgitation    Osteoarthritis    Pacemaker    Sleep disturbance    Urinary incontinence    usually from UTI's   Past Surgical History:  Procedure Laterality Date   BREAST BIOPSY  2002   BREAST LUMPECTOMY  1983   CATARACT EXTRACTION, BILATERAL     EP IMPLANTABLE DEVICE N/A 09/02/2015   Procedure: PPM Generator  Changeout;  Surgeon: Evans Lance, MD;  Location: [facility];  Service: Cardiovascular;  Laterality: N/A;   Goiter Removed  1976   cancerous. Then got RAI   PACEMAKER INSERTION  5/07   after syncope   TONSILLECTOMY AND ADENOIDECTOMY  1937   VESICOVAGINAL FISTULA CLOSURE W/ TAH  1972   Social History   Tobacco Use   Smoking status: Never   Smokeless tobacco: Never  Substance Use Topics   Alcohol use: No   Drug use: No   Family History  Problem Relation Age of Onset   Diabetes Mother    Stroke Sister    Coronary artery disease Brother    Prostate cancer  Brother    Breast cancer Neg Hx    Colon cancer Neg Hx    Allergies  Allergen Reactions   Amoxicillin Diarrhea    Weakness and atrial fib   Augmentin [Amoxicillin-Pot Clavulanate] Diarrhea    Weakness and atrial fib   Sulfonamide Derivatives     unknown   Current Outpatient Medications on File Prior to Visit  Medication Sig Dispense Refill   acetaminophen (TYLENOL) 500 MG tablet Take 500 mg by mouth every 8 (eight) hours as needed for mild pain.     apixaban (ELIQUIS) 2.5 MG TABS tablet Take 1 tablet (2.5 mg total) by mouth 2 (two) times daily. 180 tablet 3   cephALEXin (KEFLEX) 500 MG capsule Take 1 capsule (500 mg total) by mouth 3 (three) times daily as needed. 20 capsule 2   cholecalciferol (VITAMIN D) 1000 UNITS tablet Take 1,000 Units by mouth daily.     hydrochlorothiazide (HYDRODIURIL) 12.5 MG tablet TAKE 1 TABLET BY MOUTH  DAILY AS NEEDED 90 tablet 0   levothyroxine (SYNTHROID) 75 MCG tablet TAKE 1 TABLET BY MOUTH  DAILY 90 tablet 0   mirtazapine (REMERON) 15 MG tablet TAKE 1 TABLET BY MOUTH AT  BEDTIME 90 tablet 3   nitroGLYCERIN (NITROSTAT) 0.4 MG SL tablet Place 0.4 mg under the tongue every 5 (five) minutes as needed for chest pain (x 3 doses).     sotalol (BETAPACE) 120 MG tablet TAKE ONE-HALF TABLET BY  MOUTH TWICE DAILY 90 tablet 0   traMADol (ULTRAM) 50 MG tablet TAKE 1/2 TO 1 TABLET BY MOUTH 3 TIMES A DAY AS NEEDED FOR PAIN 90 tablet 0   trimethoprim (TRIMPEX) 100 MG tablet TAKE 1 TABLET BY MOUTH  DAILY 90 tablet 1   No current facility-administered medications on file prior to visit.     Review of Systems  Constitutional:  Positive for fever and malaise/fatigue. Negative for chills.  HENT:  Positive for congestion and sore throat. Negative for ear pain and sinus pain.   Eyes:  Negative for blurred vision, discharge and redness.  Respiratory:  Positive for cough. Negative for sputum production, shortness of breath, wheezing and stridor.   Cardiovascular:  Negative  for chest pain, palpitations and leg swelling.  Gastrointestinal:  Negative for abdominal pain, diarrhea, nausea and vomiting.  Musculoskeletal:  Negative for myalgias.  Skin:  Negative for rash.  Neurological:  Positive for headaches. Negative for dizziness.   Observations/Objective: Patient appears well, in no distress Hard of hearing and daughter helps with history Weight is baseline  No facial swelling or asymmetry Hoarse voice and clears throat occasionally No obvious tremor or mobility impairment Moving neck and UEs normally Able to hear the call well  No wheeze or shortness of breath during interview  Occ dry cough  No  skin changes on face or neck , no rash or pallor Affect is normal /pleasant   Assessment and Plan: Problem List Items Addressed This Visit       Other   COVID-19    Mild symptoms so far with low grade temp and cough and fatigue  Fully immunized for covid In light of age and medical hx opted to px anti viral  molnupiravir px for 5 d course  (avoid paxlovid due to renal function)  Enc fluids/rest Tylenol as needed for temp or pain and sore throat  inst to call if worse or new symptoms  ER parameters discussed        Relevant Medications   molnupiravir EUA (LAGEVRIO) 200 mg CAPS capsule     Follow Up Instructions: Take the molnupiravir as directed Drink fluids Tylenol will help fever/chills/aches and sore throat  If short of breath or any severe symptoms please go to the ER Otherwise call the office if not improving or new symptoms  Continue to isolate at minimum 5 days , until symptoms improve considerably    I discussed the assessment and treatment plan with the patient. The patient was provided an opportunity to ask questions and all were answered. The patient agreed with the plan and demonstrated an understanding of the instructions.   The patient was advised to call back or seek an in-person evaluation if the symptoms worsen or if the  condition fails to improve as anticipated.     Loura Pardon, MD

## 2021-01-20 NOTE — Assessment & Plan Note (Signed)
Mild symptoms so far with low grade temp and cough and fatigue  Fully immunized for covid In light of age and medical hx opted to px anti viral  molnupiravir px for 5 d course  (avoid paxlovid due to renal function)  Enc fluids/rest Tylenol as needed for temp or pain and sore throat  inst to call if worse or new symptoms  ER parameters discussed

## 2021-02-02 NOTE — Progress Notes (Signed)
Patient called and her daughter was spoke with me stating that she is fine and doing well. Patient's daughter was happy for the call.

## 2021-02-16 DIAGNOSIS — H903 Sensorineural hearing loss, bilateral: Secondary | ICD-10-CM | POA: Diagnosis not present

## 2021-02-16 DIAGNOSIS — H6123 Impacted cerumen, bilateral: Secondary | ICD-10-CM | POA: Diagnosis not present

## 2021-02-19 ENCOUNTER — Encounter: Payer: Medicare Other | Admitting: Internal Medicine

## 2021-02-25 ENCOUNTER — Ambulatory Visit (INDEPENDENT_AMBULATORY_CARE_PROVIDER_SITE_OTHER): Payer: Medicare Other | Admitting: Cardiovascular Disease

## 2021-02-25 ENCOUNTER — Encounter: Payer: Self-pay | Admitting: Cardiovascular Disease

## 2021-02-25 ENCOUNTER — Other Ambulatory Visit: Payer: Self-pay

## 2021-02-25 VITALS — BP 120/60 | HR 63 | Ht 63.0 in | Wt 102.0 lb

## 2021-02-25 DIAGNOSIS — N184 Chronic kidney disease, stage 4 (severe): Secondary | ICD-10-CM

## 2021-02-25 DIAGNOSIS — I5032 Chronic diastolic (congestive) heart failure: Secondary | ICD-10-CM | POA: Diagnosis not present

## 2021-02-25 DIAGNOSIS — I700 Atherosclerosis of aorta: Secondary | ICD-10-CM

## 2021-02-25 DIAGNOSIS — D631 Anemia in chronic kidney disease: Secondary | ICD-10-CM

## 2021-02-25 DIAGNOSIS — I480 Paroxysmal atrial fibrillation: Secondary | ICD-10-CM | POA: Diagnosis not present

## 2021-02-25 MED ORDER — HYDROCHLOROTHIAZIDE 12.5 MG PO TABS: 12.5000 mg | ORAL_TABLET | Freq: Every day | ORAL | 3 refills | Status: DC | PRN

## 2021-02-25 NOTE — Patient Instructions (Addendum)
Medication Instructions:  No changes  If you need a refill on your cardiac medications before your next appointment, please call your pharmacy.   Lab work: No new labs needed  Testing/Procedures: No new testing needed  Follow-Up: At [facility], you and your health needs are our priority.  As part of our continuing mission to provide you with exceptional heart care, we have created designated Provider Care Teams.  These Care Teams include your primary Cardiologist (physician) and Advanced Practice Providers (APPs -  Physician Assistants and Nurse Practitioners) who all work together to provide you with the care you need, when you need it.  You will need a follow up appointment in 9 months  Providers on your designated Care Team:   Christopher Berge, NP Ryan Dunn, PA-C Cadence Furth, PA-C  COVID-19 Vaccine Information can be found at: [email or web address] For questions related to vaccine distribution or appointments, please email [email or web address] or call [phone].   

## 2021-02-25 NOTE — Progress Notes (Signed)
*Patient ID: Amanda [name], female   DOB: February 07, 1920, 87 y.o.   MRN: [id number] Cardiology Office Note  Date:  02/25/2021   ID:  Amanda [name], DOB Jun 11, 1919, MRN [id number]  PCP:  Venia Carbon, MD   Chief Complaint  Patient presents with   12 month follow up     Patient c/o shortness of breath and fatigue with over exertion. Medications reviewed by the patient verbally.     HPI:  Amanda [name] is a pleasant 87 year old woman who  has a history of paroxysmal atrial fibrillation,  normal systolic function by echocardiogram in 2007  Holter monitor and April 2007 showing frequent and complex supraventricular arrhythmia,  cardiac CTA showing no significant coronary artery disease with atherosclerotic plaque in the thoracic aorta  managed on sotalol for rhythm control,  pacer placed in May 2007   pacemaker change out May 2017 for ERI who presents for routine followup of her paroxysmal atrial fibrillation  Presents with her daughter on today's visit Lives in [place] independent living Daughter reports hearing is worse, also having some vision issues  Two month ago, had a fall, as far she remembers, diagnosed with COVID at that time September 2022 Weaker before COVID, still weak Uses a walker, gets tired with walking Weight down but feels her appetite is good  "Meds working" No tachypalpitations appreciated  Last week with little "heavy in the chest", she did not mention anything to her daughter Emmaline Life to resolve without intervention  Prior history GI issues, waxing waning constipation with diarrhea, nothing significant recently  EKG personally reviewed by myself on todays visit Shows paced rhythm rate 63 bpm  Other past medical history  several previous falls  Previous echocardiogram for shortness of breath  echo showed mild LVH, diastolic relaxation abnormality, normal ejection fraction, mild to moderate MR, high normal right ventricular systolic pressures Cardiac  catheterization in April 2007 showing no significant coronary artery disease  PMH:   has a past medical history of Atrial fibrillation ([place]), Depression, GERD (gastroesophageal reflux disease), Hyperlipidemia, Hypertension, Hypothyroidism, Mitral regurgitation, Osteoarthritis, Pacemaker, Sleep disturbance, and Urinary incontinence.  PSH:    Past Surgical History:  Procedure Laterality Date   BREAST BIOPSY  2002   BREAST LUMPECTOMY  1983   CATARACT EXTRACTION, BILATERAL     EP IMPLANTABLE DEVICE N/A 09/02/2015   Procedure: PPM Generator Changeout;  Surgeon: Evans Lance, MD;  Location: [facility];  Service: Cardiovascular;  Laterality: N/A;   Goiter Removed  1976   cancerous. Then got RAI   PACEMAKER INSERTION  5/07   after syncope   TONSILLECTOMY AND ADENOIDECTOMY  1937   VESICOVAGINAL FISTULA CLOSURE W/ TAH  1972    Current Outpatient Medications  Medication Sig Dispense Refill   acetaminophen (TYLENOL) 500 MG tablet Take 500 mg by mouth every 8 (eight) hours as needed for mild pain.     apixaban (ELIQUIS) 2.5 MG TABS tablet Take 1 tablet (2.5 mg total) by mouth 2 (two) times daily. 180 tablet 3   cephALEXin (KEFLEX) 500 MG capsule Take 1 capsule (500 mg total) by mouth 3 (three) times daily as needed. 20 capsule 2   cholecalciferol (VITAMIN D) 1000 UNITS tablet Take 1,000 Units by mouth daily.     hydrochlorothiazide (HYDRODIURIL) 12.5 MG tablet TAKE 1 TABLET BY MOUTH  DAILY AS NEEDED 90 tablet 0   levothyroxine (SYNTHROID) 75 MCG tablet TAKE 1 TABLET BY MOUTH  DAILY 90 tablet 0   mirtazapine (REMERON) 15 MG  tablet TAKE 1 TABLET BY MOUTH AT  BEDTIME 90 tablet 3   nitroGLYCERIN (NITROSTAT) 0.4 MG SL tablet Place 0.4 mg under the tongue every 5 (five) minutes as needed for chest pain (x 3 doses).     sotalol (BETAPACE) 120 MG tablet TAKE ONE-HALF TABLET BY  MOUTH TWICE DAILY 90 tablet 0   traMADol (ULTRAM) 50 MG tablet TAKE 1/2 TO 1 TABLET BY MOUTH 3 TIMES A DAY AS NEEDED FOR  PAIN 90 tablet 0   trimethoprim (TRIMPEX) 100 MG tablet TAKE 1 TABLET BY MOUTH  DAILY 90 tablet 1   No current facility-administered medications for this visit.     Allergies:   Amoxicillin, Augmentin [amoxicillin-pot clavulanate], and Sulfonamide derivatives   Social History:  The patient  reports that she has never smoked. She has never used smokeless tobacco. She reports that she does not drink alcohol and does not use drugs.   Family History:   family history includes Coronary artery disease in her brother; Diabetes in her mother; Prostate cancer in her brother; Stroke in her sister.    Review of Systems: Review of Systems  Constitutional: Negative.   HENT: Negative.    Respiratory: Negative.    Cardiovascular: Negative.   Gastrointestinal: Negative.   Musculoskeletal: Negative.   Neurological:  Positive for weakness.  Psychiatric/Behavioral: Negative.    All other systems reviewed and are negative.  PHYSICAL EXAM: VS:  BP 120/60 (BP Location: Left Arm, Patient Position: Sitting, Cuff Size: Normal)   Pulse 63   Ht 5\' 3"  (1.6 m)   Wt 102 lb (46.3 kg)   BMI 18.07 kg/m  , BMI Body mass index is 18.07 kg/m. Constitutional:  oriented to person, place, and time. No distress.  Thin, frail HENT:  Head: Grossly normal Eyes:  no discharge. No scleral icterus.  Neck: No JVD, no carotid bruits  Cardiovascular: Regular rate and rhythm, no murmurs appreciated Pulmonary/Chest: Clear to auscultation bilaterally, no wheezes or rails Abdominal: Soft.  no distension.  no tenderness.  Musculoskeletal: Normal range of motion Neurological:  normal muscle tone. Coordination normal. No atrophy Skin: Skin warm and dry Psychiatric: normal affect, pleasant   Recent Labs: 12/31/2020: ALT 8; BUN 41; Creatinine, Ser 2.01; Hemoglobin 9.7; Platelets 214.0; Potassium 4.0; Sodium 138; TSH 0.59    Lipid Panel Lab Results  Component Value Date   CHOL 391 (H) 06/22/2011   HDL 63.80 06/22/2011    TRIG 296.0 (H) 06/22/2011    Wt Readings from Last 3 Encounters:  02/25/21 102 lb (46.3 kg)  01/20/21 104 lb (47.2 kg)  12/31/20 104 lb (47.2 kg)     ASSESSMENT AND PLAN:  Essential hypertension  Blood pressure is well controlled on today's visit. No changes made to the medications.  HCTZ as needed  Paroxysmal atrial fibrillation (HCC) -  Normal sinus rhythm, paced, on anticoagulation continue low-dose sotalol per Dr. Caryl Comes Off diltiazem maintaining normal sinus rhythm Denies any symptoms concerning for arrhythmia Fall in the setting of COVID, otherwise walks with a walker Hemoglobin 9.7, discussed iron supplement  Chronic diastolic CHF (congestive heart failure) (HCC) Appears euvolemic  Chronic renal disease,  (HCC) Creatinine stable 2.0 BUN 41  PPM-Medtronic Scheduled see Dr. Caryl Comes  Diarrhea Stable, prior history waxing waning symptoms  Tingling leg burning  neuropathy, stable    Total encounter time more than 25 minutes  Greater than 50% was spent in counseling and coordination of care with the patient   Signed, Esmond Plants, M.D., Ph.D. 02/25/2021  [place], [place]

## 2021-03-03 ENCOUNTER — Other Ambulatory Visit: Payer: Self-pay | Admitting: Internal Medicine

## 2021-03-17 ENCOUNTER — Encounter: Payer: Medicare Other | Admitting: Internal Medicine

## 2021-03-19 ENCOUNTER — Ambulatory Visit (INDEPENDENT_AMBULATORY_CARE_PROVIDER_SITE_OTHER): Payer: Medicare Other

## 2021-03-19 DIAGNOSIS — I495 Sick sinus syndrome: Secondary | ICD-10-CM | POA: Diagnosis not present

## 2021-03-19 LAB — CUP PACEART REMOTE DEVICE CHECK
Battery Impedance: 684 Ohm
Battery Remaining Longevity: 73 mo
Battery Voltage: 2.78 V
Brady Statistic AP VP Percent: 1 %
Brady Statistic AP VS Percent: 97 %
Brady Statistic AS VP Percent: 0 %
Brady Statistic AS VS Percent: 2 %
Date Time Interrogation Session: 20221201115356
Implantable Lead Implant Date: 20070524
Implantable Lead Implant Date: 20070524
Implantable Lead Location: 753859
Implantable Lead Location: 753860
Implantable Lead Model: 5076
Implantable Lead Model: 5568
Implantable Pulse Generator Implant Date: 20170516
Lead Channel Impedance Value: 456 Ohm
Lead Channel Impedance Value: 677 Ohm
Lead Channel Pacing Threshold Amplitude: 0.875 V
Lead Channel Pacing Threshold Amplitude: 1.125 V
Lead Channel Pacing Threshold Pulse Width: 0.4 ms
Lead Channel Pacing Threshold Pulse Width: 0.4 ms
Lead Channel Setting Pacing Amplitude: 2.25 V
Lead Channel Setting Pacing Amplitude: 2.5 V
Lead Channel Setting Pacing Pulse Width: 0.4 ms
Lead Channel Setting Sensing Sensitivity: 2 mV

## 2021-03-31 NOTE — Progress Notes (Signed)
Remote pacemaker transmission.   

## 2021-04-03 ENCOUNTER — Other Ambulatory Visit: Payer: Self-pay | Admitting: Internal Medicine

## 2021-04-03 NOTE — Telephone Encounter (Signed)
Last filled 01-13-21 #90 Last OV 12-31-20 Next OV 07-01-21 [facility]

## 2021-04-07 ENCOUNTER — Emergency Department: Payer: Medicare Other

## 2021-04-07 ENCOUNTER — Encounter: Payer: Self-pay | Admitting: Emergency Medicine

## 2021-04-07 ENCOUNTER — Emergency Department
Admission: EM | Admit: 2021-04-07 | Discharge: 2021-04-07 | Disposition: A | Discharge status: Home / Self Care | Payer: Medicare Other | Attending: Emergency Medicine | Admitting: Emergency Medicine

## 2021-04-07 ENCOUNTER — Other Ambulatory Visit: Payer: Self-pay

## 2021-04-07 DIAGNOSIS — I4891 Unspecified atrial fibrillation: Secondary | ICD-10-CM | POA: Diagnosis not present

## 2021-04-07 DIAGNOSIS — Z23 Encounter for immunization: Secondary | ICD-10-CM | POA: Diagnosis not present

## 2021-04-07 DIAGNOSIS — Z7901 Long term (current) use of anticoagulants: Secondary | ICD-10-CM | POA: Diagnosis not present

## 2021-04-07 DIAGNOSIS — D631 Anemia in chronic kidney disease: Secondary | ICD-10-CM | POA: Diagnosis not present

## 2021-04-07 DIAGNOSIS — Y9301 Activity, walking, marching and hiking: Secondary | ICD-10-CM | POA: Diagnosis not present

## 2021-04-07 DIAGNOSIS — R58 Hemorrhage, not elsewhere classified: Secondary | ICD-10-CM | POA: Diagnosis not present

## 2021-04-07 DIAGNOSIS — W01198A Fall on same level from slipping, tripping and stumbling with subsequent striking against other object, initial encounter: Secondary | ICD-10-CM | POA: Diagnosis not present

## 2021-04-07 DIAGNOSIS — M25521 Pain in right elbow: Secondary | ICD-10-CM | POA: Diagnosis not present

## 2021-04-07 DIAGNOSIS — R0902 Hypoxemia: Secondary | ICD-10-CM | POA: Diagnosis not present

## 2021-04-07 DIAGNOSIS — I5032 Chronic diastolic (congestive) heart failure: Secondary | ICD-10-CM | POA: Diagnosis not present

## 2021-04-07 DIAGNOSIS — Z043 Encounter for examination and observation following other accident: Secondary | ICD-10-CM | POA: Diagnosis not present

## 2021-04-07 DIAGNOSIS — Z743 Need for continuous supervision: Secondary | ICD-10-CM | POA: Diagnosis not present

## 2021-04-07 DIAGNOSIS — M25561 Pain in right knee: Secondary | ICD-10-CM | POA: Insufficient documentation

## 2021-04-07 DIAGNOSIS — S0181XA Laceration without foreign body of other part of head, initial encounter: Secondary | ICD-10-CM | POA: Diagnosis not present

## 2021-04-07 DIAGNOSIS — Z79899 Other long term (current) drug therapy: Secondary | ICD-10-CM | POA: Insufficient documentation

## 2021-04-07 DIAGNOSIS — E039 Hypothyroidism, unspecified: Secondary | ICD-10-CM | POA: Insufficient documentation

## 2021-04-07 DIAGNOSIS — Z950 Presence of cardiac pacemaker: Secondary | ICD-10-CM | POA: Insufficient documentation

## 2021-04-07 DIAGNOSIS — W19XXXA Unspecified fall, initial encounter: Secondary | ICD-10-CM

## 2021-04-07 DIAGNOSIS — Z8616 Personal history of COVID-19: Secondary | ICD-10-CM | POA: Diagnosis not present

## 2021-04-07 DIAGNOSIS — I130 Hypertensive heart and chronic kidney disease with heart failure and stage 1 through stage 4 chronic kidney disease, or unspecified chronic kidney disease: Secondary | ICD-10-CM | POA: Diagnosis not present

## 2021-04-07 DIAGNOSIS — I10 Essential (primary) hypertension: Secondary | ICD-10-CM | POA: Diagnosis not present

## 2021-04-07 DIAGNOSIS — R220 Localized swelling, mass and lump, head: Secondary | ICD-10-CM | POA: Diagnosis not present

## 2021-04-07 DIAGNOSIS — S0990XA Unspecified injury of head, initial encounter: Secondary | ICD-10-CM | POA: Diagnosis present

## 2021-04-07 DIAGNOSIS — N184 Chronic kidney disease, stage 4 (severe): Secondary | ICD-10-CM | POA: Insufficient documentation

## 2021-04-07 MED ORDER — LIDOCAINE-EPINEPHRINE 1 %-1:100000 IJ SOLN: 10.0000 mL | Freq: Once | INTRAMUSCULAR | Status: DC

## 2021-04-07 MED ORDER — TETANUS-DIPHTH-ACELL PERTUSSIS 5-2.5-18.5 LF-MCG/0.5 IM SUSY
0.5000 mL | PREFILLED_SYRINGE | Freq: Once | INTRAMUSCULAR | Status: AC
  Administered 2021-04-07: 09:00:00 0.5 mL via INTRAMUSCULAR
  Filled 2021-04-07: qty 0.5

## 2021-04-07 MED ORDER — ACETAMINOPHEN 500 MG PO TABS
1000.0000 mg | ORAL_TABLET | Freq: Once | ORAL | Status: AC
  Administered 2021-04-07: 09:00:00 1000 mg via ORAL
  Filled 2021-04-07: qty 2

## 2021-04-07 MED ORDER — LIDOCAINE HCL (PF) 1 % IJ SOLN
INTRAMUSCULAR | Status: AC
  Filled 2021-04-07: qty 5

## 2021-04-07 NOTE — ED Triage Notes (Signed)
Pt lives independently at [facility], states was pushing a walker to take clothes to laundry and stumped her toe. States she fell forward, hit forehead onto floor. Pt denies any loc, lac noted to the same. Pt is on eliquis. No other complaints, pt did walk from EMS stretcher to ED stretcher.

## 2021-04-07 NOTE — ED Notes (Signed)
Tylenol given for pain 5. Drank water without problems. Warm Blanket given

## 2021-04-07 NOTE — ED Triage Notes (Signed)
Presents s/p EMS from home   s/p fall

## 2021-04-07 NOTE — ED Provider Notes (Signed)
[facility] Emergency Department Provider Note  ____________________________________________   Event Date/Time   First MD Initiated Contact with Patient 04/07/21 [phone]     (approximate)  I have reviewed the triage vital signs and the nursing notes.   HISTORY  Chief Complaint Fall (/)    HPI Amanda [name] is a 87 y.o. female with A. fib on Eliquis who comes in with concern for mechanical fall.  Patient reports a mechanical fall where she was walking with her walker with her close on top and she stubbed her toe and she fell forward.  She hit her head.  Reports a mild headache, constant, nothing makes it better or worse.  Denies any LOC, chest pain, shortness of breath, abdominal pain.  She reports a tiny bit of pain on her right elbow and right knee but full range of motion          Past Medical History:  Diagnosis Date   Atrial fibrillation ([place])    Depression    GERD (gastroesophageal reflux disease)    Hyperlipidemia    Hypertension    Hypothyroidism    after Rx for thyroid cancer   Mitral regurgitation    Osteoarthritis    Pacemaker    Sleep disturbance    Urinary incontinence    usually from UTI's    Patient Active Problem List   Diagnosis Date Noted   COVID-19 01/20/2021   Fatigue 07/02/2020   Malnutrition of mild degree ([place]) 12/28/2019   Left foot pain 10/12/2019   Acute cystitis 01/05/2019   Secondary hyperparathyroidism of renal origin ([place]) 02/14/2018   Advance directive discussed with patient 08/16/2017   Long term (current) use of anticoagulants 04/07/2017   Inguinal hernia 12/21/2016   Neuropathy 10/08/2016   Pedal edema 09/14/2016   Paroxysmal atrial fibrillation ([place]) 04/20/2016   Anemia of chronic renal failure, stage 4 (severe) ([place]) 02/05/2015   Recurrent cystitis 02/25/2014   Routine general medical examination at a health care facility 07/16/2013   Thoracic aorta atherosclerosis ([place]) 07/16/2013   Encounter for  therapeutic drug monitoring 06/11/2013   Chronic diastolic CHF (congestive heart failure) ([place]) 05/29/2013   Chronic renal disease, stage IV ([place]) 12/16/2011   GERD (gastroesophageal reflux disease)    SICK SINUS/ TACHY-BRADY SYNDROME 12/15/2009   PPM-Medtronic 12/15/2009   Mitral valve disorder 12/08/2009   Episodic mood disorder ([place]) 05/05/2009   HEARING LOSS 04/25/2009   Hypothyroidism 12/27/2008   Hyperlipidemia 12/27/2008   Essential hypertension 12/27/2008   OSTEOARTHRITIS 12/27/2008   RLS (restless legs syndrome) 12/27/2008   URINARY INCONTINENCE 12/27/2008    Past Surgical History:  Procedure Laterality Date   BREAST BIOPSY  2002   BREAST LUMPECTOMY  1983   CATARACT EXTRACTION, BILATERAL     EP IMPLANTABLE DEVICE N/A 09/02/2015   Procedure: PPM Generator Changeout;  Surgeon: Evans Lance, MD;  Location: [facility];  Service: Cardiovascular;  Laterality: N/A;   Goiter Removed  1976   cancerous. Then got RAI   PACEMAKER INSERTION  5/07   after syncope   TONSILLECTOMY AND ADENOIDECTOMY  1937   VESICOVAGINAL FISTULA CLOSURE W/ TAH  1972    Prior to Admission medications   Medication Sig Start Date End Date Taking? Authorizing Provider  acetaminophen (TYLENOL) 500 MG tablet Take 500 mg by mouth every 8 (eight) hours as needed for mild pain.    [provider]  apixaban (ELIQUIS) 2.5 MG TABS tablet Take 1 tablet (2.5 mg total) by mouth  2 (two) times daily. 07/02/20   Venia Carbon, MD  cephALEXin (KEFLEX) 500 MG capsule Take 1 capsule (500 mg total) by mouth 3 (three) times daily as needed. 12/31/20   Venia Carbon, MD  cholecalciferol (VITAMIN D) 1000 UNITS tablet Take 1,000 Units by mouth daily.    [provider]  hydrochlorothiazide (HYDRODIURIL) 12.5 MG tablet Take 1 tablet (12.5 mg total) by mouth daily as needed. 02/25/21   Minna Merritts, MD  levothyroxine (SYNTHROID) 75 MCG tablet TAKE 1 TABLET BY MOUTH  DAILY 03/03/21   Viviana Simpler I, MD  mirtazapine (REMERON) 15 MG tablet TAKE 1 TABLET BY MOUTH AT  BEDTIME 06/20/20   Venia Carbon, MD  nitroGLYCERIN (NITROSTAT) 0.4 MG SL tablet Place 0.4 mg under the tongue every 5 (five) minutes as needed for chest pain (x 3 doses).    [provider]  sotalol (BETAPACE) 120 MG tablet TAKE ONE-HALF TABLET BY  MOUTH TWICE DAILY 03/03/21   Venia Carbon, MD  traMADol (ULTRAM) 50 MG tablet TAKE 1/2 TO 1 TABLET BY MOUTH 3 TIMES A DAY AS NEEDED FOR PAIN 04/05/21   Venia Carbon, MD  trimethoprim (TRIMPEX) 100 MG tablet TAKE 1 TABLET BY MOUTH  DAILY 09/17/20   Venia Carbon, MD    Allergies Amoxicillin, Augmentin [amoxicillin-pot clavulanate], and Sulfonamide derivatives  Family History  Problem Relation Age of Onset   Diabetes Mother    Stroke Sister    Coronary artery disease Brother    Prostate cancer Brother    Breast cancer Neg Hx    Colon cancer Neg Hx     Social History Social History   Tobacco Use   Smoking status: Never   Smokeless tobacco: Never  Vaping Use   Vaping Use: Never used  Substance Use Topics   Alcohol use: No   Drug use: No      Review of Systems Constitutional: No fever/chills Eyes: No visual changes. ENT: No sore throat. Cardiovascular: Denies chest pain. Respiratory: Denies shortness of breath. Gastrointestinal: No abdominal pain.  No nausea, no vomiting.  No diarrhea.  No constipation. Genitourinary: Negative for dysuria. Musculoskeletal: Negative for back pain. Skin: Negative for rash. Neurological: Negative for headaches, focal weakness or numbness. All other ROS negative ____________________________________________   PHYSICAL EXAM:  VITAL SIGNS: ED Triage Vitals [04/07/21 0817]  Enc Vitals Group     BP (!) 174/80     Pulse Rate 64     Resp 18     Temp 98.6 F (37 C)     Temp Source Oral     SpO2 99 %     Weight 102 lb (46.3 kg)     Height      Head Circumference      Peak Flow      Pain  Score      Pain Loc      Pain Edu?      Excl. in [place]?     Constitutional: Alert and oriented. Well appearing and in no acute distress. Eyes: Conjunctivae are normal. EOMI. Head: Abrasion to the top of the head. Nose: No congestion/rhinnorhea. Mouth/Throat: Mucous membranes are moist.   Neck: No stridor. Trachea Midline. FROM.  Abrasions on her nasal pad secondary to her glasses Cardiovascular: Normal rate, regular rhythm. Grossly normal heart sounds.  Good peripheral circulation. Respiratory: Normal respiratory effort.  No retractions. Lungs CTAB. Gastrointestinal: Soft and nontender. No distention. No abdominal bruits.  Musculoskeletal: No lower extremity  tenderness nor edema.  No joint effusions.  Small abrasion on right knee but full range of motion.  Small skin tear on right elbow but full range of motion without any tenderness. Neurologic:  Normal speech and language. No gross focal neurologic deficits are appreciated.  Skin:  Skin is warm, dry and intact. No rash noted. Psychiatric: Mood and affect are normal. Speech and behavior are normal. GU: Deferred   ____________________________________________   RADIOLOGY   Official radiology report(s): CT HEAD WO CONTRAST (5MM)  Result Date: 04/07/2021 CLINICAL DATA:  Fall, hit forehead EXAM: CT HEAD WITHOUT CONTRAST TECHNIQUE: Contiguous axial images were obtained from the base of the skull through the vertex without intravenous contrast. COMPARISON:  CT head 11/20/2019 FINDINGS: Brain: There is no evidence of acute intracranial hemorrhage, extra-axial fluid collection, or acute infarct. There is mild global parenchymal volume loss with commensurate enlargement of the ventricular system, unchanged. Confluent hypodensity in the subcortical and periventricular white matter likely reflects sequela of advanced chronic white matter microangiopathy. There is no solid mass lesion. There is no midline shift. Vascular: There is calcification of the  bilateral cavernous ICAs and vertebral arteries. Skull: Normal. Negative for fracture or focal lesion. Sinuses/Orbits: There is complete opacification of the right frontal sinus, anterior ethmoid air cells, and partial opacification of the imaged right maxillary sinus, unchanged. The polypoid soft tissue lesion with smooth expansile bony remodeling in the right nasal cavity has slightly increased in size since 2019. Bilateral lens implants are in place. The globes and orbits are otherwise unremarkable. Other: There is minimal swelling over the left frontoparietal scalp and left forehead. There is unchanged opacification of the underpneumatized right mastoid air cells and middle ear cavity. IMPRESSION: 1. No acute intracranial pathology. 2. Stable global parenchymal volume loss and chronic white matter microangiopathy. 3. Minimal swelling over the left frontoparietal scalp and forehead no acute osseous abnormality. 4. Increased size of a polypoid soft tissue density lesion in the right nasal cavity since 2019. Consider direct visualization if not already performed. Electronically Signed   By: Valetta Mole M.D.   On: 04/07/2021 08:46   CT Cervical Spine Wo Contrast  Result Date: 04/07/2021 CLINICAL DATA:  Fall EXAM: CT CERVICAL SPINE WITHOUT CONTRAST TECHNIQUE: Multidetector CT imaging of the cervical spine was performed without intravenous contrast. Multiplanar CT image reconstructions were also generated. COMPARISON:  Cervical spine CT 03/03/2018 FINDINGS: Alignment: There is trace retrolisthesis of C5 on C6, unchanged. There is no other antero or retrolisthesis. There is no jumped or perched facets or other evidence of traumatic malalignment. Skull base and vertebrae: Skull base alignment is maintained. Vertebral body heights are preserved. There is no evidence of acute fracture. Soft tissues and spinal canal: No prevertebral fluid or swelling. No visible canal hematoma. Disc levels: There is multilevel  intervertebral disc space narrowing with associated degenerative endplate change, most advanced at C4-C5 and C5-C6. There is multilevel facet arthropathy, most advanced on the left at C3-C4. The osseous spinal canal is patent. There is multilevel moderate to severe neural foraminal stenosis, overall worse on the left. Upper chest: The imaged lung apices are clear. Other: None. IMPRESSION: 1. No acute fracture or traumatic malalignment of the cervical spine. 2. Multilevel degenerative changes as above. Electronically Signed   By: Valetta Mole M.D.   On: 04/07/2021 08:51    ____________________________________________   PROCEDURES  Procedure(s) performed (including Critical Care):  Marland KitchenMarland KitchenLaceration Repair  Date/Time: 04/07/2021 11:11 AM Performed by: Vanessa [place], MD Authorized by: Jari Pigg,  Royetta Crochet, MD   Consent:    Consent obtained:  Verbal   Consent given by:  Healthcare agent   Risks discussed:  Infection, pain, retained foreign body, tendon damage, poor cosmetic result, nerve damage, need for additional repair, poor wound healing and vascular damage   Alternatives discussed:  No treatment Universal protocol:    Patient identity confirmed:  Verbally with patient Anesthesia:    Anesthesia method:  Local infiltration   Local anesthetic:  Lidocaine 1% w/o epi Laceration details:    Location:  Face   Face location:  Forehead   Length (cm):  3 Treatment:    Area cleansed with:  Saline   Irrigation method:  Syringe Skin repair:    Repair method:  Sutures   Suture material:  Fast-absorbing gut   Suture technique:  Figure eight   Number of sutures:  4 Approximation:    Approximation:  Close Post-procedure details:    Dressing:  Sterile dressing   Procedure completion:  Tolerated Comments:     Patient had 1 area with concern for potential very small arterial bleed.  Was unable to find the source of the arterial bleed given this was an abrasion just on the top of the head and not really  anything to close up.  I threw 4 figure-of-eight sutures that was unable to stop the bleeding.  Compression slowed the bleeding slightly but continued to ooze from any area that was being sutured therefore Avitene was placed and compression gauze were placed on top.  These were left in place for 30 minutes and dressing was taken down and the Avitene had absorbed into the lacerated area and bleeding had subsided therefore new bandage was placed on top.   ____________________________________________   INITIAL IMPRESSION / ASSESSMENT AND PLAN / ED COURSE  Amanda [name] was evaluated in Emergency Department on 04/07/2021 for the symptoms described in the history of present illness. She was evaluated in the context of the global COVID-19 pandemic, which necessitated consideration that the patient might be at risk for infection with the SARS-CoV-2 virus that causes COVID-19. Institutional protocols and algorithms that pertain to the evaluation of patients at risk for COVID-19 are in a state of rapid change based on information released by regulatory bodies including the CDC and federal and state organizations. These policies and algorithms were followed during the patient's care in the ED.    Patient comes in with head trauma that was mechanical in nature.  CT imaging ordered evaluate for intracranial hemorrhage, cervical fracture.  Incidental finding of possible nasal polyp will was noted.  Family expressed understanding of having some issues with nasal passages previously they can follow this up with her primary care doctor.  At their age they were not really planning to do anything.  Does not sound like syncope.  We will abrasion on her elbow and her knee but full range of motion and no significant pain.  The laceration on her forehead was difficult to control due to a bleed.  See procedure note for more details but of bleeding was able to be subsided with Avitene and placement of gauze.  They are going to  follow-up with her primary care doctor in 2 days for wound check and return to the ER if bleeding continues       ____________________________________________   FINAL CLINICAL IMPRESSION(S) / ED DIAGNOSES   Final diagnoses:  Fall, initial encounter  Laceration of forehead, initial encounter      MEDICATIONS GIVEN DURING  THIS VISIT:  Medications  lidocaine-EPINEPHrine (XYLOCAINE W/EPI) 1 %-1:100000 (with pres) injection 10 mL (has no administration in time range)  lidocaine (PF) (XYLOCAINE) 1 % injection (has no administration in time range)  acetaminophen (TYLENOL) tablet 1,000 mg (1,000 mg Oral Given 04/07/21 0913)  Tdap (BOOSTRIX) injection 0.5 mL (0.5 mLs Intramuscular Given 04/07/21 7408)     ED Discharge Orders     None        Note:  This document was prepared using Dragon voice recognition software and may include unintentional dictation errors.    Vanessa Anza, MD 04/07/21 [phone]

## 2021-04-07 NOTE — ED Notes (Signed)
Pt fell while walking states stumped her toe and fell forward hitting head no tile floor. No loc, pt is A&Ox3, lac noted to forehead with small amt of bleeding noted. Pt is on eliquis. No co other then pain to head and bridge of nose where her glasses caused abrasions to bridge of nose.

## 2021-04-07 NOTE — Discharge Instructions (Addendum)
°  CT scan is as per below and please discuss the findings with your primary doctor.  Follow-up for a wound check in 2 days with your primary doctor.  We placed something called Avitene to help form clots given I was not able to make it stop with sutures.  The sutures that were placed were absorbable so nothing will need to be removed but they can check the wound in 2 days to make sure that it is continuing to heal.  Return to the ER if you develop fevers, bleeding returns or any other concerns  IMPRESSION: 1. No acute intracranial pathology. 2. Stable global parenchymal volume loss and chronic white matter microangiopathy. 3. Minimal swelling over the left frontoparietal scalp and forehead no acute osseous abnormality. 4. Increased size of a polypoid soft tissue density lesion in the right nasal cavity since 2019. Consider direct visualization if not already performed.

## 2021-04-09 ENCOUNTER — Telehealth: Payer: Self-pay

## 2021-04-09 NOTE — Telephone Encounter (Signed)
Spoke to daughter, [name]. She said pt is doing well after fall.

## 2021-04-30 ENCOUNTER — Ambulatory Visit (INDEPENDENT_AMBULATORY_CARE_PROVIDER_SITE_OTHER): Payer: Medicare Other | Admitting: Internal Medicine

## 2021-04-30 ENCOUNTER — Other Ambulatory Visit: Payer: Self-pay

## 2021-04-30 ENCOUNTER — Encounter: Payer: Self-pay | Admitting: Internal Medicine

## 2021-04-30 VITALS — BP 156/70 | HR 78 | Ht 63.0 in | Wt 105.2 lb

## 2021-04-30 DIAGNOSIS — I480 Paroxysmal atrial fibrillation: Secondary | ICD-10-CM | POA: Diagnosis not present

## 2021-04-30 DIAGNOSIS — Z950 Presence of cardiac pacemaker: Secondary | ICD-10-CM

## 2021-04-30 NOTE — Patient Instructions (Signed)
Medication Instructions:  Your physician recommends that you continue on your current medications as directed. Please refer to the Current Medication list given to you today.  *If you need a refill on your cardiac medications before your next appointment, please call your pharmacy*   Lab Work: None ordered   Testing/Procedures: None ordered   Follow-Up: At [facility], you and your health needs are our priority.  As part of our continuing mission to provide you with exceptional heart care, we have created designated Provider Care Teams.  These Care Teams include your primary Cardiologist (physician) and Advanced Practice Providers (APPs -  Physician Assistants and Nurse Practitioners) who all work together to provide you with the care you need, when you need it.  Remote monitoring is used to monitor your Pacemaker or ICD from home. This monitoring reduces the number of office visits required to check your device to one time per year. It allows [place] to keep an eye on the functioning of your device to ensure it is working properly. You are scheduled for a device check from home on 06/18/21. You may send your transmission at any time that day. If you have a wireless device, the transmission will be sent automatically. After your physician reviews your transmission, you will receive a postcard with your next transmission date.  Your next appointment:   1 year(s)  The format for your next appointment:   In Person  Provider:   Virl Axe, MD   Thank you for choosing [facility]!!   [phone]

## 2021-04-30 NOTE — Progress Notes (Signed)
Patient Care Team: Venia Carbon, MD as PCP - General Rockey Situ Kathlene November, MD as Consulting Physician (Cardiology)   HPI  Amanda [name] is a 88 y.o. female seen in followup with a previously implanted pacemaker in 2007 for syncope and apparently tachybradycardia syndrome; she has a history of paroxysmal atrial fibrillation managed with sotalol. She underwent generator replacement 5/17  She still lives by herself at [facility] independent living and does most of her daily chores.   She continues to have recurrent falls.  She ended up with a head laceration that required stitches.  It is her impression that of her daughter that she falls because her feet catch while she is walking.  She is averse to physical therapy, asked by her daughter and then again by me  Some shortness of breath with effort.  No chest pain no edema    Date Cr K Mg Hgb  6/17  1.76 4.3    4/18  1.28 4.1  10.3  1/19 2.15 4.0  10.8  9/20 2.08 4.5    8/21 1.83 4.6  10.4  9/22 2.01  4.0  9.7       Past Medical History:  Diagnosis Date   Atrial fibrillation ([place])    Depression    GERD (gastroesophageal reflux disease)    Hyperlipidemia    Hypertension    Hypothyroidism    after Rx for thyroid cancer   Mitral regurgitation    Osteoarthritis    Pacemaker    Sleep disturbance    Urinary incontinence    usually from UTI's    Past Surgical History:  Procedure Laterality Date   BREAST BIOPSY  2002   BREAST LUMPECTOMY  1983   CATARACT EXTRACTION, BILATERAL     EP IMPLANTABLE DEVICE N/A 09/02/2015   Procedure: PPM Generator Changeout;  Surgeon: Evans Lance, MD;  Location: [place] [facility];  Service: Cardiovascular;  Laterality: N/A;   Goiter Removed  1976   cancerous. Then got RAI   PACEMAKER INSERTION  5/07   after syncope   TONSILLECTOMY AND ADENOIDECTOMY  1937   VESICOVAGINAL FISTULA CLOSURE W/ TAH  1972    Current Outpatient Medications  Medication Sig Dispense Refill    acetaminophen (TYLENOL) 500 MG tablet Take 500 mg by mouth every 8 (eight) hours as needed for mild pain.     apixaban (ELIQUIS) 2.5 MG TABS tablet Take 1 tablet (2.5 mg total) by mouth 2 (two) times daily. 180 tablet 3   cephALEXin (KEFLEX) 500 MG capsule Take 1 capsule (500 mg total) by mouth 3 (three) times daily as needed. 20 capsule 2   cholecalciferol (VITAMIN D) 1000 UNITS tablet Take 1,000 Units by mouth daily.     hydrochlorothiazide (HYDRODIURIL) 12.5 MG tablet Take 1 tablet (12.5 mg total) by mouth daily as needed. 90 tablet 3   levothyroxine (SYNTHROID) 75 MCG tablet TAKE 1 TABLET BY MOUTH  DAILY 90 tablet 3   mirtazapine (REMERON) 15 MG tablet TAKE 1 TABLET BY MOUTH AT  BEDTIME 90 tablet 3   nitroGLYCERIN (NITROSTAT) 0.4 MG SL tablet Place 0.4 mg under the tongue every 5 (five) minutes as needed for chest pain (x 3 doses).     sotalol (BETAPACE) 120 MG tablet TAKE ONE-HALF TABLET BY  MOUTH TWICE DAILY 90 tablet 3   traMADol (ULTRAM) 50 MG tablet TAKE 1/2 TO 1 TABLET BY MOUTH 3 TIMES A DAY AS NEEDED FOR PAIN 90 tablet 0  trimethoprim (TRIMPEX) 100 MG tablet TAKE 1 TABLET BY MOUTH  DAILY 90 tablet 1   No current facility-administered medications for this visit.    Allergies  Allergen Reactions   Amoxicillin Diarrhea    Weakness and atrial fib   Augmentin [Amoxicillin-Pot Clavulanate] Diarrhea    Weakness and atrial fib   Sulfonamide Derivatives     unknown    Review of Systems negative except from HPI and PMH  Physical Exam BP (!) 156/70    Pulse 78    Ht 5\' 3"  (1.6 m)    Wt 105 lb 3.2 oz (47.7 kg)    SpO2 95%    BMI 18.64 kg/m   HR 61   Well developed and well nourished in no acute distress HENT normal Neck supple with JVP-flat Clear Device pocket well healed; without hematoma or erythema.  There is no tethering  Regular rate and rhythm, no  murmur Abd-soft with active BS No Clubbing cyanosis  edema Skin-warm and dry A & Oriented  Grossly normal sensory and motor  function  ECG  A pacing @ 78 21/09/37 Rare PVC    Assessment and  Plan  Atrial fibrillation with some undersensing  Sinus node dysfunction   Pacemaker-Medtronic   High risk medication surveillance-sotalol  Renal insufficiency Gd 5  Hypertension  Anemia chronic  Orthostatic intolerance//Falls  Recurrent falls.  Thought to be mechanical.  I asked again and the patient again refused physical therapy.  Discussed with her daughter that the risk of falls is major and perhaps someone in the home to assist her with walking or to be available with walking would be appropriate if it were affordable.  Remain very concerned about the sotalol with her renal insufficiency which is grade 4-5.  She has remained adamant about staying on it.  We will continue it at 60 mg twice daily.  Continue apixaban 2.5 mg twice daily.  Blood pressure remains elevated but we worry about the orthostasis.  Continue HydroDIURIL 12.5 taking as needed for edema

## 2021-05-21 DIAGNOSIS — H6123 Impacted cerumen, bilateral: Secondary | ICD-10-CM | POA: Diagnosis not present

## 2021-05-21 DIAGNOSIS — H903 Sensorineural hearing loss, bilateral: Secondary | ICD-10-CM | POA: Diagnosis not present

## 2021-05-28 ENCOUNTER — Other Ambulatory Visit: Payer: Self-pay | Admitting: Internal Medicine

## 2021-06-11 ENCOUNTER — Other Ambulatory Visit: Payer: Self-pay | Admitting: Cardiovascular Disease

## 2021-06-11 ENCOUNTER — Other Ambulatory Visit: Payer: Self-pay | Admitting: Internal Medicine

## 2021-06-18 ENCOUNTER — Ambulatory Visit (INDEPENDENT_AMBULATORY_CARE_PROVIDER_SITE_OTHER): Payer: Medicare Other

## 2021-06-18 DIAGNOSIS — I495 Sick sinus syndrome: Secondary | ICD-10-CM

## 2021-06-19 LAB — CUP PACEART REMOTE DEVICE CHECK
Battery Impedance: 814 Ohm
Battery Remaining Longevity: 65 mo
Battery Voltage: 2.78 V
Brady Statistic AP VP Percent: 1 %
Brady Statistic AP VS Percent: 96 %
Brady Statistic AS VP Percent: 0 %
Brady Statistic AS VS Percent: 3 %
Date Time Interrogation Session: 20230302113338
Implantable Lead Implant Date: 20070524
Implantable Lead Implant Date: 20070524
Implantable Lead Location: 753859
Implantable Lead Location: 753860
Implantable Lead Model: 5076
Implantable Lead Model: 5568
Implantable Pulse Generator Implant Date: 20170516
Lead Channel Impedance Value: 478 Ohm
Lead Channel Impedance Value: 690 Ohm
Lead Channel Pacing Threshold Amplitude: 1 V
Lead Channel Pacing Threshold Amplitude: 1.25 V
Lead Channel Pacing Threshold Pulse Width: 0.4 ms
Lead Channel Pacing Threshold Pulse Width: 0.4 ms
Lead Channel Setting Pacing Amplitude: 2.5 V
Lead Channel Setting Pacing Amplitude: 2.5 V
Lead Channel Setting Pacing Pulse Width: 0.4 ms
Lead Channel Setting Sensing Sensitivity: 2.8 mV

## 2021-06-25 NOTE — Progress Notes (Signed)
Remote pacemaker transmission.   

## 2021-06-26 ENCOUNTER — Other Ambulatory Visit: Payer: Self-pay | Admitting: Internal Medicine

## 2021-06-26 NOTE — Telephone Encounter (Signed)
Last filled 04-05-21 #90 ?Last OV 12-31-20 ?Next OV 07-01-21 ?[facility] ?

## 2021-07-01 ENCOUNTER — Other Ambulatory Visit: Payer: Self-pay

## 2021-07-01 ENCOUNTER — Encounter: Payer: Self-pay | Admitting: Internal Medicine

## 2021-07-01 ENCOUNTER — Ambulatory Visit (INDEPENDENT_AMBULATORY_CARE_PROVIDER_SITE_OTHER): Payer: Medicare Other | Admitting: Internal Medicine

## 2021-07-01 DIAGNOSIS — N184 Chronic kidney disease, stage 4 (severe): Secondary | ICD-10-CM | POA: Diagnosis not present

## 2021-07-01 DIAGNOSIS — E441 Mild protein-calorie malnutrition: Secondary | ICD-10-CM | POA: Diagnosis not present

## 2021-07-01 DIAGNOSIS — I480 Paroxysmal atrial fibrillation: Secondary | ICD-10-CM

## 2021-07-01 DIAGNOSIS — I5032 Chronic diastolic (congestive) heart failure: Secondary | ICD-10-CM

## 2021-07-01 DIAGNOSIS — F39 Unspecified mood [affective] disorder: Secondary | ICD-10-CM | POA: Diagnosis not present

## 2021-07-01 DIAGNOSIS — I209 Angina pectoris, unspecified: Secondary | ICD-10-CM | POA: Insufficient documentation

## 2021-07-01 NOTE — Assessment & Plan Note (Signed)
Stable DOE ?Likely more chronotropic than ischemic ?

## 2021-07-01 NOTE — Assessment & Plan Note (Signed)
Weight holding with the mirtazapine '15mg'$  daily ?Has reasonable appetite ?

## 2021-07-01 NOTE — Assessment & Plan Note (Signed)
Some boredom and anhedonia ?Doing okay though on the mirtazapine ?

## 2021-07-01 NOTE — Progress Notes (Addendum)
? ?Subjective:  ? ? Patient ID: Amanda [name], female    DOB: 12/12/1919, 88 y.o.   MRN: [id number] ? ?HPI ?Here with daughter [name] for follow up of malnutrition and other chronic health conditions ? ?Eating "good" ?Weight holding ? ?Has noticed more nervous feelings ?Some SOB--when she exerts herself----has to sit down and rest ?No major palpitations--but has some "spells" of feeling it ?No chest pain ?No dizziness or syncope ? ?"Same dull thing everyday" ?Not dissatisfied ?Still lives alone ?Daughter brings food--some prepared but still cooks ?Daughter helps with heavy housework--like vacuuming ? ?Last GFR slightly better at 20 ? ?Current Outpatient Medications on File Prior to Visit  ?Medication Sig Dispense Refill  ? acetaminophen (TYLENOL) 500 MG tablet Take 500 mg by mouth every 8 (eight) hours as needed for mild pain.    ? cephALEXin (KEFLEX) 500 MG capsule Take 1 capsule (500 mg total) by mouth 3 (three) times daily as needed. 20 capsule 2  ? cholecalciferol (VITAMIN D) 1000 UNITS tablet Take 1,000 Units by mouth daily.    ? ELIQUIS 2.5 MG TABS tablet TAKE 1 TABLET BY MOUTH  TWICE DAILY 180 tablet 3  ? hydrochlorothiazide (HYDRODIURIL) 12.5 MG tablet TAKE 1 TABLET BY MOUTH  DAILY AS NEEDED 90 tablet 3  ? levothyroxine (SYNTHROID) 75 MCG tablet TAKE 1 TABLET BY MOUTH  DAILY 90 tablet 3  ? mirtazapine (REMERON) 15 MG tablet TAKE 1 TABLET BY MOUTH AT  BEDTIME 90 tablet 3  ? nitroGLYCERIN (NITROSTAT) 0.4 MG SL tablet Place 0.4 mg under the tongue every 5 (five) minutes as needed for chest pain (x 3 doses).    ? sotalol (BETAPACE) 120 MG tablet TAKE ONE-HALF TABLET BY  MOUTH TWICE DAILY 90 tablet 3  ? traMADol (ULTRAM) 50 MG tablet TAKE 1/2 TO 1 TABLET BY MOUTH 3 TIMES A DAY AS NEEDED FOR PAIN 90 tablet 0  ? trimethoprim (TRIMPEX) 100 MG tablet TAKE 1 TABLET BY MOUTH  DAILY 90 tablet 1  ? ?No current facility-administered medications on file prior to visit.  ? ? ?Allergies  ?Allergen Reactions  ? Amoxicillin  Diarrhea  ?  Weakness and atrial fib  ? Augmentin [Amoxicillin-Pot Clavulanate] Diarrhea  ?  Weakness and atrial fib  ? Sulfonamide Derivatives   ?  unknown  ? ? ?Past Medical History:  ?Diagnosis Date  ? Atrial fibrillation ([place])   ? Depression   ? GERD (gastroesophageal reflux disease)   ? Hyperlipidemia   ? Hypertension   ? Hypothyroidism   ? after Rx for thyroid cancer  ? Mitral regurgitation   ? Osteoarthritis   ? Pacemaker   ? Sleep disturbance   ? Urinary incontinence   ? usually from UTI's  ? ? ?Past Surgical History:  ?Procedure Laterality Date  ? BREAST BIOPSY  2002  ? BREAST LUMPECTOMY  1983  ? CATARACT EXTRACTION, BILATERAL    ? EP IMPLANTABLE DEVICE N/A 09/02/2015  ? Procedure: PPM Generator Changeout;  Surgeon: Evans Lance, MD;  Location: [facility];  Service: Cardiovascular;  Laterality: N/A;  ? Goiter Removed  1976  ? cancerous. Then got RAI  ? PACEMAKER INSERTION  5/07  ? after syncope  ? TONSILLECTOMY AND ADENOIDECTOMY  1937  ? VESICOVAGINAL FISTULA CLOSURE W/ TAH  1972  ? ? ?Family History  ?Problem Relation Age of Onset  ? Diabetes Mother   ? Stroke Sister   ? Coronary artery disease Brother   ? Prostate cancer Brother   ?  Breast cancer Neg Hx   ? Colon cancer Neg Hx   ? ? ?Social History  ? ?Socioeconomic History  ? Marital status: Widowed  ?  Spouse name: Not on file  ? Number of children: 2  ? Years of education: Not on file  ? Highest education level: Not on file  ?Occupational History  ? Occupation: homemaker  ?Tobacco Use  ? Smoking status: Never  ? Smokeless tobacco: Never  ?Vaping Use  ? Vaping Use: Never used  ?Substance and Sexual Activity  ? Alcohol use: No  ? Drug use: No  ? Sexual activity: Not on file  ?Other Topics Concern  ? Not on file  ?Social History Narrative  ? Widowed 2002. 1 local daughter. Other in [place]. Was homemaker. Farmed and gardened.   ? 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   ? Daughter [name]) is health care POA.   ? Discussed DNR and she requests   ? Would not  want tube feeds if cognitively unaware  ? ?Social Determinants of Health  ? ?Financial Resource Strain: Not on file  ?Food Insecurity: Not on file  ?Transportation Needs: Not on file  ?Physical Activity: Not on file  ?Stress: Not on file  ?Social Connections: Not on file  ?Intimate Partner Violence: Not on file  ? ?Review of Systems ?Sleeps okay --occasional bad night ?Uses keflex prn for dysuria/cystitis ?   ?Objective:  ? Physical Exam ?Constitutional:   ?   Appearance: Normal appearance.  ?Cardiovascular:  ?   Rate and Rhythm: Normal rate. Rhythm irregular.  ?   Heart sounds:  ?  No gallop.  ?   Comments: Low level coarse systolic murmur---along sternal border ?Pulmonary:  ?   Effort: Pulmonary effort is normal.  ?   Breath sounds: Normal breath sounds. No wheezing or rales.  ?Musculoskeletal:  ?   Cervical back: Neck supple.  ?   Right lower leg: No edema.  ?   Left lower leg: No edema.  ?Lymphadenopathy:  ?   Cervical: No cervical adenopathy.  ?Neurological:  ?   Mental Status: She is alert.  ?Psychiatric:     ?   Mood and Affect: Mood normal.     ?   Behavior: Behavior normal.  ?  ? ? ? ? ?   ?Assessment & Plan:  ? ?

## 2021-07-01 NOTE — Assessment & Plan Note (Signed)
Last GFR 20 ?No uremic symptoms ?

## 2021-07-01 NOTE — Assessment & Plan Note (Signed)
Compensated with the HCTZ 12.5 daily  ?

## 2021-07-01 NOTE — Assessment & Plan Note (Signed)
Sounds persistent now ?Rate is fine on the sotalol 120  ?On eliquis 2.5 bid ?

## 2021-09-17 ENCOUNTER — Ambulatory Visit (INDEPENDENT_AMBULATORY_CARE_PROVIDER_SITE_OTHER): Payer: Medicare Other

## 2021-09-17 DIAGNOSIS — I495 Sick sinus syndrome: Secondary | ICD-10-CM

## 2021-09-20 LAB — CUP PACEART REMOTE DEVICE CHECK
Battery Impedance: 920 Ohm
Battery Remaining Longevity: 61 mo
Battery Voltage: 2.78 V
Brady Statistic AP VP Percent: 2 %
Brady Statistic AP VS Percent: 96 %
Brady Statistic AS VP Percent: 0 %
Brady Statistic AS VS Percent: 2 %
Date Time Interrogation Session: 20230604123414
Implantable Lead Implant Date: 20070524
Implantable Lead Implant Date: 20070524
Implantable Lead Location: 753859
Implantable Lead Location: 753860
Implantable Lead Model: 5076
Implantable Lead Model: 5568
Implantable Pulse Generator Implant Date: 20170516
Lead Channel Impedance Value: 447 Ohm
Lead Channel Impedance Value: 665 Ohm
Lead Channel Pacing Threshold Amplitude: 1 V
Lead Channel Pacing Threshold Amplitude: 1.125 V
Lead Channel Pacing Threshold Pulse Width: 0.4 ms
Lead Channel Pacing Threshold Pulse Width: 0.4 ms
Lead Channel Setting Pacing Amplitude: 2.25 V
Lead Channel Setting Pacing Amplitude: 2.5 V
Lead Channel Setting Pacing Pulse Width: 0.4 ms
Lead Channel Setting Sensing Sensitivity: 2.8 mV

## 2021-09-25 ENCOUNTER — Telehealth: Payer: Self-pay | Admitting: Internal Medicine

## 2021-09-25 MED ORDER — TRAMADOL HCL 50 MG PO TABS: ORAL_TABLET | ORAL | 0 refills | Status: DC

## 2021-09-25 NOTE — Telephone Encounter (Signed)
I sent it  

## 2021-09-25 NOTE — Progress Notes (Signed)
Remote pacemaker transmission.   

## 2021-09-25 NOTE — Telephone Encounter (Signed)
Caller Name: [name] Koger(Pts Daughter) Call back phone #: [phone]  MEDICATION(S): traMADol (ULTRAM) 50 MG tablet   Days of Med Remaining: 2 days left, today and tomorrow  Has the patient contacted their pharmacy (YES/NO)?  Yes, She said she called them last week and they said the sent a request but I dont see anything, She said she needs to be able to sleep and wont have enough until Monday IF YES, when and what did the pharmacy advise?  IF NO, request that the patient contact the pharmacy for the refills in the future.             The pharmacy will send an electronic request (except for controlled medications).  [facility] [address], [place] [address]   ~~~Please advise patient/caregiver to allow 2-3 business days to process RX refills.

## 2021-11-19 DIAGNOSIS — H903 Sensorineural hearing loss, bilateral: Secondary | ICD-10-CM | POA: Diagnosis not present

## 2021-11-19 DIAGNOSIS — H6123 Impacted cerumen, bilateral: Secondary | ICD-10-CM | POA: Diagnosis not present

## 2021-11-25 ENCOUNTER — Other Ambulatory Visit: Payer: Self-pay | Admitting: Internal Medicine

## 2021-12-17 ENCOUNTER — Other Ambulatory Visit: Payer: Self-pay | Admitting: Family Medicine

## 2021-12-17 ENCOUNTER — Ambulatory Visit (INDEPENDENT_AMBULATORY_CARE_PROVIDER_SITE_OTHER): Payer: Medicare Other

## 2021-12-17 DIAGNOSIS — I495 Sick sinus syndrome: Secondary | ICD-10-CM

## 2021-12-17 NOTE — Telephone Encounter (Signed)
Last filled 09-25-21 #90 Last OV 07-01-21 Next OV 01-06-22 [facility]

## 2021-12-21 LAB — CUP PACEART REMOTE DEVICE CHECK
Battery Impedance: 973 Ohm
Battery Remaining Longevity: 59 mo
Battery Voltage: 2.78 V
Brady Statistic AP VP Percent: 2 %
Brady Statistic AP VS Percent: 96 %
Brady Statistic AS VP Percent: 0 %
Brady Statistic AS VS Percent: 2 %
Date Time Interrogation Session: 20230831110814
Implantable Lead Implant Date: 20070524
Implantable Lead Implant Date: 20070524
Implantable Lead Location: 753859
Implantable Lead Location: 753860
Implantable Lead Model: 5076
Implantable Lead Model: 5568
Implantable Pulse Generator Implant Date: 20170516
Lead Channel Impedance Value: 448 Ohm
Lead Channel Impedance Value: 655 Ohm
Lead Channel Pacing Threshold Amplitude: 0.875 V
Lead Channel Pacing Threshold Amplitude: 1.125 V
Lead Channel Pacing Threshold Pulse Width: 0.4 ms
Lead Channel Pacing Threshold Pulse Width: 0.4 ms
Lead Channel Setting Pacing Amplitude: 2.25 V
Lead Channel Setting Pacing Amplitude: 2.5 V
Lead Channel Setting Pacing Pulse Width: 0.4 ms
Lead Channel Setting Sensing Sensitivity: 2.8 mV

## 2022-01-06 ENCOUNTER — Ambulatory Visit (INDEPENDENT_AMBULATORY_CARE_PROVIDER_SITE_OTHER): Payer: Medicare Other | Admitting: Internal Medicine

## 2022-01-06 ENCOUNTER — Encounter: Payer: Self-pay | Admitting: Internal Medicine

## 2022-01-06 VITALS — BP 118/68 | HR 66 | Temp 97.4°F | Ht 61.5 in | Wt 105.0 lb

## 2022-01-06 DIAGNOSIS — N184 Chronic kidney disease, stage 4 (severe): Secondary | ICD-10-CM

## 2022-01-06 DIAGNOSIS — I480 Paroxysmal atrial fibrillation: Secondary | ICD-10-CM | POA: Diagnosis not present

## 2022-01-06 DIAGNOSIS — Z23 Encounter for immunization: Secondary | ICD-10-CM

## 2022-01-06 DIAGNOSIS — M159 Polyosteoarthritis, unspecified: Secondary | ICD-10-CM | POA: Diagnosis not present

## 2022-01-06 DIAGNOSIS — Z0000 Encounter for general adult medical examination without abnormal findings: Secondary | ICD-10-CM | POA: Diagnosis not present

## 2022-01-06 DIAGNOSIS — N2581 Secondary hyperparathyroidism of renal origin: Secondary | ICD-10-CM | POA: Diagnosis not present

## 2022-01-06 DIAGNOSIS — I209 Angina pectoris, unspecified: Secondary | ICD-10-CM | POA: Diagnosis not present

## 2022-01-06 DIAGNOSIS — F39 Unspecified mood [affective] disorder: Secondary | ICD-10-CM | POA: Diagnosis not present

## 2022-01-06 DIAGNOSIS — I5032 Chronic diastolic (congestive) heart failure: Secondary | ICD-10-CM | POA: Diagnosis not present

## 2022-01-06 DIAGNOSIS — M150 Primary generalized (osteo)arthritis: Secondary | ICD-10-CM

## 2022-01-06 DIAGNOSIS — E039 Hypothyroidism, unspecified: Secondary | ICD-10-CM

## 2022-01-06 LAB — RENAL FUNCTION PANEL
Albumin: 3.7 g/dL (ref 3.5–5.2)
BUN: 38 mg/dL — ABNORMAL HIGH (ref 6–23)
CO2: 32 mEq/L (ref 19–32)
Calcium: 10.1 mg/dL (ref 8.4–10.5)
Chloride: 102 mEq/L (ref 96–112)
Creatinine, Ser: 2.05 mg/dL — ABNORMAL HIGH (ref 0.40–1.20)
GFR: 19.63 mL/min — ABNORMAL LOW (ref >60.00)
Glucose, Bld: 129 mg/dL — ABNORMAL HIGH (ref 70–99)
Phosphorus: 3.4 mg/dL (ref 2.3–4.6)
Potassium: 4 mEq/L (ref 3.5–5.1)
Sodium: 140 mEq/L (ref 135–145)

## 2022-01-06 LAB — CBC
HCT: 29.5 % — ABNORMAL LOW (ref 36.0–46.0)
Hemoglobin: 9.8 g/dL — ABNORMAL LOW (ref 12.0–15.0)
MCHC: 33.3 g/dL (ref 30.0–36.0)
MCV: 85.6 fl (ref 78.0–100.0)
Platelets: 181 10*3/uL (ref 150.0–400.0)
RBC: 3.45 Mil/uL — ABNORMAL LOW (ref 3.87–5.11)
RDW: 14.7 % (ref 11.5–15.5)
WBC: 4.7 10*3/uL (ref 4.0–10.5)

## 2022-01-06 LAB — T4, FREE: Free T4: 1.19 ng/dL (ref 0.60–1.60)

## 2022-01-06 LAB — HEPATIC FUNCTION PANEL
ALT: 9 U/L (ref 0–35)
AST: 20 U/L (ref 0–37)
Albumin: 3.7 g/dL (ref 3.5–5.2)
Alkaline Phosphatase: 94 U/L (ref 39–117)
Bilirubin, Direct: 0.2 mg/dL (ref 0.0–0.3)
Total Bilirubin: 0.6 mg/dL (ref 0.2–1.2)
Total Protein: 6.6 g/dL (ref 6.0–8.3)

## 2022-01-06 LAB — TSH: TSH: 0.14 u[IU]/mL — ABNORMAL LOW (ref 0.35–5.50)

## 2022-01-06 LAB — VITAMIN D 25 HYDROXY (VIT D DEFICIENCY, FRACTURES): VITD: 71.56 ng/mL (ref 30.00–100.00)

## 2022-01-06 NOTE — Assessment & Plan Note (Signed)
Chronic dysthymia No medication indicated though Discussed social interaction

## 2022-01-06 NOTE — Assessment & Plan Note (Signed)
Stable DOE Hasn't needed the nitro

## 2022-01-06 NOTE — Assessment & Plan Note (Signed)
Seems euthyroid ?Will check labs ?

## 2022-01-06 NOTE — Assessment & Plan Note (Signed)
I have personally reviewed the Medicare Annual Wellness questionnaire and have noted 1. The patient's medical and social history 2. Their use of alcohol, tobacco or illicit drugs 3. Their current medications and supplements 4. The patient's functional ability including ADL's, fall risks, home safety risks and hearing or visual             impairment. 5. Diet and physical activities 6. Evidence for depression or mood disorders  The patients weight, height, BMI and visual acuity have been recorded in the chart I have made referrals, counseling and provided education to the patient based review of the above and I have provided the pt with a written personalized care plan for preventive services.  I have provided you with a copy of your personalized plan for preventive services. Please take the time to review along with your updated medication list.  No cancer screening Flu vaccine today COVID vaccine soon--at pharmacy

## 2022-01-06 NOTE — Assessment & Plan Note (Signed)
No uremic symptoms Will check labs

## 2022-01-06 NOTE — Assessment & Plan Note (Signed)
Notes some increased edema--but not evident now Is on HCTZ 12/'5mg'$ 

## 2022-01-06 NOTE — Assessment & Plan Note (Signed)
Is on the vitamin D and last PTH normal

## 2022-01-06 NOTE — Patient Instructions (Signed)
It might help your indigestion and swallowing to take famotidine '20mg'$  at bedtime.

## 2022-01-06 NOTE — Assessment & Plan Note (Signed)
Paced today Rare palpitations on sotalol '60mg'$  bid eliquis 2.5 bid

## 2022-01-06 NOTE — Progress Notes (Signed)
Subjective:    Patient ID: Amanda [name], female    DOB: [birth year], 88 y.o.   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 ~2 years ago Hearing is poor No alcohol or tobacco Not really able to exercise May have fallen once--no injury Chronic mood issues Memory is fairly good--mild issues  Still feels weak and not so good Still in her own place---does some cooking, laundry Generally doesn't feel like going out much Does go to daughter's house--and out to lunch weekly Still has friends---but doesn't visit much Never got into a church here---still considers her [place] her home Goes to library--but hard to read (but can with accomodation)  Feels her heart racing at times--not frequent No chest pain Still gets easy DOE Ankles still swell--may be worse lately but usually better in the morning (till recently) Some dizzy feeling--orthostatic. No syncope Relying more on rollator  Last GFR 20 Is on the vitamin D  Current Outpatient Medications on File Prior to Visit  Medication Sig Dispense Refill   acetaminophen (TYLENOL) 500 MG tablet Take 500 mg by mouth every 8 (eight) hours as needed for mild pain.     cholecalciferol (VITAMIN D) 1000 UNITS tablet Take 1,000 Units by mouth daily.     ELIQUIS 2.5 MG TABS tablet TAKE 1 TABLET BY MOUTH  TWICE DAILY 180 tablet 3   hydrochlorothiazide (HYDRODIURIL) 12.5 MG tablet TAKE 1 TABLET BY MOUTH  DAILY AS NEEDED 90 tablet 3   levothyroxine (SYNTHROID) 75 MCG tablet TAKE 1 TABLET BY MOUTH  DAILY 90 tablet 3   mirtazapine (REMERON) 15 MG tablet TAKE 1 TABLET BY MOUTH AT  BEDTIME 90 tablet 3    nitroGLYCERIN (NITROSTAT) 0.4 MG SL tablet Place 0.4 mg under the tongue every 5 (five) minutes as needed for chest pain (x 3 doses).     sotalol (BETAPACE) 120 MG tablet TAKE ONE-HALF TABLET BY  MOUTH TWICE DAILY 90 tablet 3   traMADol (ULTRAM) 50 MG tablet TAKE 1/2 TO 1 TABLET BY MOUTH 3 TIMES A DAY AS NEEDED FOR PAIN 90 tablet 0   trimethoprim (TRIMPEX) 100 MG tablet TAKE 1 TABLET BY MOUTH DAILY 90 tablet 1   No current facility-administered medications on file prior to visit.    Allergies  Allergen Reactions   Amoxicillin Diarrhea    Weakness and atrial fib   Augmentin [Amoxicillin-Pot Clavulanate] Diarrhea    Weakness and atrial fib   Sulfonamide Derivatives     unknown    Past Medical History:  Diagnosis Date   Atrial fibrillation (HCC)    Depression    GERD (gastroesophageal reflux disease)    Hyperlipidemia    Hypertension    Hypothyroidism    after Rx for thyroid cancer   Mitral regurgitation    Osteoarthritis    Pacemaker    Sleep disturbance    Urinary incontinence    usually from UTI's    Past Surgical History:  Procedure Laterality Date   BREAST BIOPSY  2002   BREAST LUMPECTOMY  1983   CATARACT EXTRACTION, BILATERAL     EP IMPLANTABLE DEVICE N/A 09/02/2015   Procedure: PPM Generator Changeout;  Surgeon: Evans Lance, MD;  Location: [facility];  Service: Cardiovascular;  Laterality: N/A;   Goiter Removed  1976   cancerous. Then got RAI   PACEMAKER INSERTION  5/07   after syncope   TONSILLECTOMY AND ADENOIDECTOMY  1937   VESICOVAGINAL FISTULA CLOSURE W/ TAH  1972    Family History  Problem Relation Age of Onset   Diabetes Mother    Stroke Sister    Coronary artery disease Brother    Prostate cancer Brother    Breast cancer Neg Hx    Colon cancer Neg Hx     Social History   Socioeconomic History   Marital status: Widowed    Spouse name: Not on file   Number of children: 2   Years of education: Not on file   Highest education  level: Not on file  Occupational History   Occupation: homemaker  Tobacco Use   Smoking status: Never    Passive exposure: Past   Smokeless tobacco: Never  Vaping Use   Vaping Use: Never used  Substance and Sexual Activity   Alcohol use: No   Drug use: No   Sexual activity: Not on file  Other Topics Concern   Not on file  Social History Narrative   Widowed 2002. 1 local daughter. Other in [place]. Was homemaker. Farmed and gardened.    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    Daughter [name]) is health care POA.    Discussed DNR and she requests    Would not want tube feeds if cognitively unaware   Social Determinants of Health   Financial Resource Strain: Not on file  Food Insecurity: Not on file  Transportation Needs: Not on file  Physical Activity: Not on file  Stress: Not on file  Social Connections: Not on file  Intimate Partner Violence: Not on file   Review of Systems Eating fair Weight holding Sleeps fine Wears seat belt Dentures--hasn't been to dentist Some heartburn/indigestion--uses tums with success. Some vague trouble swallowing---daughter wasn't aware. Bowels can be slow--eats prunes. No blood Voids okay. Uses the trimethoprim prn when she gets burning. No incontinence Some arthritis pain---hands, etc. Uses the tylenol and OTC topical    Objective:   Physical Exam Constitutional:      Appearance: Normal appearance.  HENT:     Mouth/Throat:     Comments: No lesions Cardiovascular:     Rate and Rhythm: Normal rate and regular rhythm.     Heart sounds:     No gallop.     Comments: Gr 3/6 coarse systolic murmur at the base Pulmonary:     Effort: Pulmonary effort is normal.     Breath sounds: Normal breath sounds. No wheezing or rales.  Abdominal:     Palpations: Abdomen is soft.     Tenderness: There is no abdominal tenderness.  Musculoskeletal:     Cervical back: Neck supple.     Right lower leg: No edema.     Left lower leg: No edema.  Lymphadenopathy:      Cervical: No cervical adenopathy.  Skin:    Findings: No rash.  Neurological:     General: No focal deficit present.     Mental Status: She is alert and oriented to person, place, and time.     Comments: Mini-cog normal            Assessment & Plan:

## 2022-01-06 NOTE — Assessment & Plan Note (Signed)
Uses the tramadol '50mg'$  nightly

## 2022-01-07 ENCOUNTER — Telehealth: Payer: Self-pay

## 2022-01-07 ENCOUNTER — Telehealth: Payer: Self-pay | Admitting: Internal Medicine

## 2022-01-07 LAB — PARATHYROID HORMONE, INTACT (NO CA): PTH: 22 pg/mL (ref 16–77)

## 2022-01-07 MED ORDER — LEVOTHYROXINE SODIUM 50 MCG PO TABS: 50.0000 ug | ORAL_TABLET | Freq: Every day | ORAL | 3 refills | Status: DC

## 2022-01-07 NOTE — Telephone Encounter (Signed)
Spoke to [place]. Sent rx to [facility] per request.

## 2022-01-07 NOTE — Telephone Encounter (Signed)
-----   Message from Venia Carbon, MD sent at 01/07/2022 12:55 PM EDT ----- Please call daughter The blood work is generally okay---but the thyroid test indicate she is taking too much. Have her change to 50 mcg (send new Rx #90 x 3). We can recheck the levels at her 6 month visit as long as she is feeling okay. Kidney tests (BUN, creatinine and GFR) are stable Blood count is also stable The elevated sugar (glucose) is of unclear significance (and normal if she wasn't strictly fasting) No other changes needed Send [name] a copy for her if she wants

## 2022-01-07 NOTE — Progress Notes (Signed)
Remote pacemaker transmission.   

## 2022-01-07 NOTE — Telephone Encounter (Signed)
See previous note opened for reason of call.

## 2022-01-07 NOTE — Telephone Encounter (Signed)
Daughter returned call on behalf of patient(mom). She would like a call back.

## 2022-01-07 NOTE — Telephone Encounter (Signed)
Left message for daughter, [name], to call back.

## 2022-01-07 NOTE — Addendum Note (Signed)
Addended by: Pilar Grammes on: 01/07/2022 01:35 PM   Modules accepted: Orders

## 2022-02-08 NOTE — Progress Notes (Signed)
*Patient ID: Amanda [name], female   DOB: 11-10-1919, 88 y.o.   MRN: [id number] Cardiology Office Note  Date:  02/09/2022   ID:  Amanda [name], DOB [birth year], MRN [id number]  PCP:  Amanda Carbon, MD   Chief Complaint  Patient presents with   9 month follow up     Patient c/o rapid heart beats when she is tired & has bilateral LE edema in the evenings. Medications reviewed by the patient verbally.     HPI:  Amanda [name] is a pleasant 88 year old woman who  has a history of paroxysmal atrial fibrillation,  normal systolic function by echocardiogram in 2007  Holter monitor and April 2007 showing frequent and complex supraventricular arrhythmia,  cardiac CTA showing no significant coronary artery disease with atherosclerotic plaque in the thoracic aorta  managed on sotalol for rhythm control,  pacer placed in May 2007   pacemaker change out May 2017 for ERI who presents for routine followup of her paroxysmal atrial fibrillation  Last seen by myself in clinic November 2022 In follow-up today she reports doing fine Very hard of hearing, most of the history provided by her daughter Lives independently at [facility], daughter checks up on her Patient does her own medications Occasional palpitations  Pacer downloads reviewed, 1% atrial fibrillation, paroxysmal  Denies significant shortness of breath on exertion, no chest pain, no leg swelling No recent falls  Weight stable  Prior history GI issues, waxing waning constipation with diarrhea, nothing significant recently  EKG personally reviewed by myself on todays visit Shows paced rhythm rate 61 bpm  Other past medical history  several previous falls  Previous echocardiogram for shortness of breath  echo showed mild LVH, diastolic relaxation abnormality, normal ejection fraction, mild to moderate MR, high normal right ventricular systolic pressures Cardiac catheterization in April 2007 showing no significant coronary artery  disease  PMH:   has a past medical history of Atrial fibrillation ([place]), Depression, GERD (gastroesophageal reflux disease), Hyperlipidemia, Hypertension, Hypothyroidism, Mitral regurgitation, Osteoarthritis, Pacemaker, Sleep disturbance, and Urinary incontinence.  PSH:    Past Surgical History:  Procedure Laterality Date   BREAST BIOPSY  2002   BREAST LUMPECTOMY  1983   CATARACT EXTRACTION, BILATERAL     EP IMPLANTABLE DEVICE N/A 09/02/2015   Procedure: PPM Generator Changeout;  Surgeon: Amanda Lance, MD;  Location: [facility];  Service: Cardiovascular;  Laterality: N/A;   Goiter Removed  1976   cancerous. Then got RAI   PACEMAKER INSERTION  5/07   after syncope   TONSILLECTOMY AND ADENOIDECTOMY  1937   VESICOVAGINAL FISTULA CLOSURE W/ TAH  1972    Current Outpatient Medications  Medication Sig Dispense Refill   acetaminophen (TYLENOL) 500 MG tablet Take 500 mg by mouth every 8 (eight) hours as needed for mild pain.     cholecalciferol (VITAMIN D) 1000 UNITS tablet Take 1,000 Units by mouth daily.     ELIQUIS 2.5 MG TABS tablet TAKE 1 TABLET BY MOUTH  TWICE DAILY 180 tablet 3   hydrochlorothiazide (HYDRODIURIL) 12.5 MG tablet TAKE 1 TABLET BY MOUTH  DAILY AS NEEDED 90 tablet 3   levothyroxine (SYNTHROID) 50 MCG tablet Take 1 tablet (50 mcg total) by mouth daily. 90 tablet 3   mirtazapine (REMERON) 15 MG tablet TAKE 1 TABLET BY MOUTH AT  BEDTIME 90 tablet 3   nitroGLYCERIN (NITROSTAT) 0.4 MG SL tablet Place 0.4 mg under the tongue every 5 (five) minutes as needed for chest pain (x 3 doses).  sotalol (BETAPACE) 120 MG tablet TAKE ONE-HALF TABLET BY  MOUTH TWICE DAILY 90 tablet 3   traMADol (ULTRAM) 50 MG tablet TAKE 1/2 TO 1 TABLET BY MOUTH 3 TIMES A DAY AS NEEDED FOR PAIN 90 tablet 0   trimethoprim (TRIMPEX) 100 MG tablet TAKE 1 TABLET BY MOUTH DAILY 90 tablet 1   No current facility-administered medications for this visit.     Allergies:   Amoxicillin, Augmentin  [amoxicillin-pot clavulanate], and Sulfonamide derivatives   Social History:  The patient  reports that she has never smoked. She has been exposed to tobacco smoke. She has never used smokeless tobacco. She reports that she does not drink alcohol and does not use drugs.   Family History:   family history includes Coronary artery disease in her brother; Diabetes in her mother; Prostate cancer in her brother; Stroke in her sister.    Review of Systems: Review of Systems  Constitutional: Negative.   HENT: Negative.    Respiratory: Negative.    Cardiovascular: Negative.   Gastrointestinal: Negative.   Musculoskeletal: Negative.   Neurological:  Positive for weakness.  Psychiatric/Behavioral: Negative.    All other systems reviewed and are negative.   PHYSICAL EXAM: VS:  BP 130/70 (BP Location: Left Arm, Patient Position: Sitting, Cuff Size: Normal)   Pulse 61   Ht '5\' 1"'$  (1.549 m)   Wt 102 lb (46.3 kg)   SpO2 92%   BMI 19.27 kg/m  , BMI Body mass index is 19.27 kg/m. Constitutional:  oriented to person, place, and time. No distress.  Frail, hard of hearing HENT:  Head: Grossly normal Eyes:  no discharge. No scleral icterus.  Neck: No JVD, no carotid bruits  Cardiovascular: Regular rate and rhythm, no murmurs appreciated Pulmonary/Chest: Clear to auscultation bilaterally, no wheezes or rails Abdominal: Soft.  no distension.  no tenderness.  Musculoskeletal: Normal range of motion Neurological:  normal muscle tone. Coordination normal. No atrophy Skin: Skin warm and dry Psychiatric: normal affect, pleasant, minimally conversant   Recent Labs: 01/06/2022: ALT 9; BUN 38; Creatinine, Ser 2.05; Hemoglobin 9.8; Platelets 181.0; Potassium 4.0; Sodium 140; TSH 0.14    Lipid Panel Lab Results  Component Value Date   CHOL 391 (H) 06/22/2011   HDL 63.80 06/22/2011   TRIG 296.0 (H) 06/22/2011    Wt Readings from Last 3 Encounters:  02/09/22 102 lb (46.3 kg)  01/06/22 105 lb  (47.6 kg)  07/01/21 105 lb (47.6 kg)     ASSESSMENT AND PLAN:  Essential hypertension  Blood pressure is well controlled on today's visit. No changes made to the medications.  Paroxysmal atrial fibrillation (HCC) -  Close to 1% burden atrial fibrillation, she reports she is not very symptomatic Normal sinus rhythm on today's visit, paced, on anticoagulation continue low-dose sotalol per Dr. Caryl Comes For symptoms of tachypalpitations could consider taking diltiazem 30 mg pill as needed Low but stable hemoglobin, no recent falls  Chronic diastolic CHF (congestive heart failure) (HCC) Appears euvolemic No leg swelling, denies shortness of breath  Chronic renal disease,  (HCC) Creatinine stable 2.0 BUN 41 Stable over the past year  PPM-[employer] All of I Dr. Janine Ores reviewed  Diarrhea Stable No recent issues per patient's daughter  Tingling leg burning  neuropathy, stable    Total encounter time more than 30 minutes  Greater than 50% was spent in counseling and coordination of care with the patient   Signed, Esmond Plants, M.D., Ph.D. 02/09/2022  [place], [place]

## 2022-02-09 ENCOUNTER — Encounter: Payer: Self-pay | Admitting: Cardiovascular Disease

## 2022-02-09 ENCOUNTER — Ambulatory Visit: Payer: Medicare Other | Attending: Cardiovascular Disease | Admitting: Cardiovascular Disease

## 2022-02-09 VITALS — BP 130/70 | HR 61 | Ht 61.0 in | Wt 102.0 lb

## 2022-02-09 DIAGNOSIS — E782 Mixed hyperlipidemia: Secondary | ICD-10-CM | POA: Diagnosis not present

## 2022-02-09 DIAGNOSIS — I10 Essential (primary) hypertension: Secondary | ICD-10-CM

## 2022-02-09 DIAGNOSIS — D631 Anemia in chronic kidney disease: Secondary | ICD-10-CM

## 2022-02-09 DIAGNOSIS — I209 Angina pectoris, unspecified: Secondary | ICD-10-CM | POA: Diagnosis not present

## 2022-02-09 DIAGNOSIS — I700 Atherosclerosis of aorta: Secondary | ICD-10-CM | POA: Diagnosis not present

## 2022-02-09 DIAGNOSIS — I5032 Chronic diastolic (congestive) heart failure: Secondary | ICD-10-CM | POA: Diagnosis not present

## 2022-02-09 DIAGNOSIS — I480 Paroxysmal atrial fibrillation: Secondary | ICD-10-CM

## 2022-02-09 DIAGNOSIS — N184 Chronic kidney disease, stage 4 (severe): Secondary | ICD-10-CM

## 2022-02-09 DIAGNOSIS — I495 Sick sinus syndrome: Secondary | ICD-10-CM | POA: Diagnosis not present

## 2022-02-09 DIAGNOSIS — Z950 Presence of cardiac pacemaker: Secondary | ICD-10-CM | POA: Diagnosis not present

## 2022-02-09 NOTE — Patient Instructions (Signed)
Please call if you have increasing tachycardia spells We could use a pill as needed for atrial fibrillation   Medication Instructions:  No changes  If you need a refill on your cardiac medications before your next appointment, please call your pharmacy.   Lab work: No new labs needed  Testing/Procedures: No new testing needed  Follow-Up: At [facility], you and your health needs are our priority.  As part of our continuing mission to provide you with exceptional heart care, we have created designated Provider Care Teams.  These Care Teams include your primary Cardiologist (physician) and Advanced Practice Providers (APPs -  Physician Assistants and Nurse Practitioners) who all work together to provide you with the care you need, when you need it.  You will need a follow up appointment in 12 months  Providers on your designated Care Team:   Murray Hodgkins, NP Christell Faith, PA-C Cadence Kathlen Mody, [place]  COVID-19 Vaccine Information can be found at: [email or web address].uk For questions related to vaccine distribution or appointments, please email [email or web address] or call 4194659821.

## 2022-02-20 DIAGNOSIS — Z23 Encounter for immunization: Secondary | ICD-10-CM | POA: Diagnosis not present

## 2022-03-04 ENCOUNTER — Other Ambulatory Visit: Payer: Self-pay | Admitting: Internal Medicine

## 2022-03-18 ENCOUNTER — Other Ambulatory Visit: Payer: Self-pay | Admitting: Internal Medicine

## 2022-03-18 NOTE — Telephone Encounter (Signed)
Last filled 12-17-21 #90 Last OV 01-06-22 Next OV 07-07-22 [facility]

## 2022-03-25 ENCOUNTER — Ambulatory Visit (INDEPENDENT_AMBULATORY_CARE_PROVIDER_SITE_OTHER): Payer: Medicare Other

## 2022-03-25 DIAGNOSIS — I495 Sick sinus syndrome: Secondary | ICD-10-CM

## 2022-03-26 LAB — CUP PACEART REMOTE DEVICE CHECK
Battery Impedance: 1054 Ohm
Battery Remaining Longevity: 55 mo
Battery Voltage: 2.78 V
Brady Statistic AP VP Percent: 1 %
Brady Statistic AP VS Percent: 96 %
Brady Statistic AS VP Percent: 0 %
Brady Statistic AS VS Percent: 2 %
Date Time Interrogation Session: 20231207120432
Implantable Lead Connection Status: 753985
Implantable Lead Connection Status: 753985
Implantable Lead Implant Date: 20070524
Implantable Lead Implant Date: 20070524
Implantable Lead Location: 753859
Implantable Lead Location: 753860
Implantable Lead Model: 5076
Implantable Lead Model: 5568
Implantable Pulse Generator Implant Date: 20170516
Lead Channel Impedance Value: 470 Ohm
Lead Channel Impedance Value: 485 Ohm
Lead Channel Pacing Threshold Amplitude: 0.75 V
Lead Channel Pacing Threshold Amplitude: 1.125 V
Lead Channel Pacing Threshold Pulse Width: 0.4 ms
Lead Channel Pacing Threshold Pulse Width: 0.4 ms
Lead Channel Setting Pacing Amplitude: 2 V
Lead Channel Setting Pacing Amplitude: 2.5 V
Lead Channel Setting Pacing Pulse Width: 0.4 ms
Lead Channel Setting Sensing Sensitivity: 2.8 mV
Zone Setting Status: 755011
Zone Setting Status: 755011

## 2022-03-29 ENCOUNTER — Telehealth: Payer: Self-pay

## 2022-03-29 NOTE — Telephone Encounter (Signed)
Called for patient to send remote transmission. Spoke to patients daughter and states she is going to see patient this Wednesday 03/31/22 and will send then.

## 2022-03-29 NOTE — Telephone Encounter (Signed)
-----   Message from Deboraha Sprang, MD sent at 03/29/2022 11:55 AM EST ----- Remote reviewed. This remote is abnormal for atrial lead polarity switch ; paces in atriu >95 % Lets check one more transmission -- to see if she is still pacing  And id so would do nothing differently

## 2022-03-31 NOTE — Telephone Encounter (Signed)
Remote transmission received 03/30/22.   Following report received from [employer] received for  Pt. initiated transmission. Atrial lead polarity switch - clinic aware, will scheduled f/u. 528 mode swith episdoes, 2 new EGM's, AF,  burden 1.8%.   Will send to Dr. Caryl Comes to update. Anticipate no changes.

## 2022-04-08 DIAGNOSIS — H903 Sensorineural hearing loss, bilateral: Secondary | ICD-10-CM | POA: Diagnosis not present

## 2022-04-08 DIAGNOSIS — H6123 Impacted cerumen, bilateral: Secondary | ICD-10-CM | POA: Diagnosis not present

## 2022-04-09 NOTE — Telephone Encounter (Signed)
Per interrogation last AF is over so will follow

## 2022-04-16 NOTE — Progress Notes (Signed)
Remote pacemaker transmission.   

## 2022-04-28 ENCOUNTER — Other Ambulatory Visit: Payer: Self-pay | Admitting: Internal Medicine

## 2022-05-20 DIAGNOSIS — H6123 Impacted cerumen, bilateral: Secondary | ICD-10-CM | POA: Diagnosis not present

## 2022-05-20 DIAGNOSIS — H903 Sensorineural hearing loss, bilateral: Secondary | ICD-10-CM | POA: Diagnosis not present

## 2022-05-31 ENCOUNTER — Other Ambulatory Visit: Payer: Self-pay | Admitting: Internal Medicine

## 2022-05-31 ENCOUNTER — Other Ambulatory Visit: Payer: Self-pay | Admitting: Cardiovascular Disease

## 2022-06-08 ENCOUNTER — Encounter: Payer: Self-pay | Admitting: Internal Medicine

## 2022-06-08 ENCOUNTER — Ambulatory Visit: Payer: Medicare Other | Attending: Internal Medicine | Admitting: Internal Medicine

## 2022-06-08 VITALS — BP 159/78 | HR 144 | Ht 62.0 in | Wt 106.0 lb

## 2022-06-08 DIAGNOSIS — Z950 Presence of cardiac pacemaker: Secondary | ICD-10-CM | POA: Diagnosis not present

## 2022-06-08 DIAGNOSIS — I495 Sick sinus syndrome: Secondary | ICD-10-CM

## 2022-06-08 LAB — CUP PACEART INCLINIC DEVICE CHECK
Battery Impedance: 1136 Ohm
Battery Remaining Longevity: 53 mo
Battery Voltage: 2.77 V
Brady Statistic AP VP Percent: 4 %
Brady Statistic AP VS Percent: 93 %
Brady Statistic AS VP Percent: 0 %
Brady Statistic AS VS Percent: 3 %
Date Time Interrogation Session: 20240220142345
Implantable Lead Connection Status: 753985
Implantable Lead Connection Status: 753985
Implantable Lead Implant Date: 20070524
Implantable Lead Implant Date: 20070524
Implantable Lead Location: 753859
Implantable Lead Location: 753860
Implantable Lead Model: 5076
Implantable Lead Model: 5568
Implantable Pulse Generator Implant Date: 20170516
Lead Channel Impedance Value: 452 Ohm
Lead Channel Impedance Value: 655 Ohm
Lead Channel Sensing Intrinsic Amplitude: 2 mV
Lead Channel Sensing Intrinsic Amplitude: 5.6 mV
Lead Channel Setting Pacing Amplitude: 2 V
Lead Channel Setting Pacing Amplitude: 2.5 V
Lead Channel Setting Pacing Pulse Width: 0.4 ms
Lead Channel Setting Sensing Sensitivity: 2 mV
Zone Setting Status: 755011
Zone Setting Status: 755011

## 2022-06-08 LAB — PACEMAKER DEVICE OBSERVATION

## 2022-06-08 NOTE — Progress Notes (Unsigned)
Patient Care Team: Venia Carbon, MD as PCP - General Rockey Situ Kathlene November, MD as Consulting Physician (Cardiology)   HPI  Amanda [name] is a 89 y.o. female seen in followup with a previously implanted pacemaker in 2007 for syncope and apparently tachybradycardia syndrome; she has a history of paroxysmal atrial fibrillation managed with sotalol. She underwent generator replacement 5/17  She still lives by herself at [facility] independent living and does most of her daily chores.   She continues to have recurrent falls.  She ended up with a head laceration that required stitches.  It is her impression that of her daughter that she falls because her feet catch while she is walking.  She is averse to physical therapy, asked by her daughter and then again by me  Some shortness of breath with effort.  No chest pain no edema    Date Cr K Mg Hgb  6/17  1.76 4.3    4/18  1.28 4.1  10.3  1/19 2.15 4.0  10.8  9/20 2.08 4.5    8/21 1.83 4.6  10.4  9/22 2.01  4.0  9.7       Past Medical History:  Diagnosis Date   Atrial fibrillation ([place])    Depression    GERD (gastroesophageal reflux disease)    Hyperlipidemia    Hypertension    Hypothyroidism    after Rx for thyroid cancer   Mitral regurgitation    Osteoarthritis    Pacemaker    Sleep disturbance    Urinary incontinence    usually from UTI's    Past Surgical History:  Procedure Laterality Date   BREAST BIOPSY  2002   BREAST LUMPECTOMY  1983   CATARACT EXTRACTION, BILATERAL     EP IMPLANTABLE DEVICE N/A 09/02/2015   Procedure: PPM Generator Changeout;  Surgeon: Evans Lance, MD;  Location: [facility];  Service: Cardiovascular;  Laterality: N/A;   Goiter Removed  1976   cancerous. Then got RAI   PACEMAKER INSERTION  5/07   after syncope   TONSILLECTOMY AND ADENOIDECTOMY  1937   VESICOVAGINAL FISTULA CLOSURE W/ TAH  1972    Current Outpatient Medications  Medication Sig Dispense Refill    acetaminophen (TYLENOL) 500 MG tablet Take 500 mg by mouth every 8 (eight) hours as needed for mild pain.     cephALEXin (KEFLEX) 500 MG capsule TAKE 1 CAPSULE (500 MG TOTAL) BY MOUTH 3 (THREE) TIMES DAILY AS NEEDED. 20 capsule 2   cholecalciferol (VITAMIN D) 1000 UNITS tablet Take 1,000 Units by mouth daily.     ELIQUIS 2.5 MG TABS tablet TAKE 1 TABLET BY MOUTH TWICE  DAILY 180 tablet 3   hydrochlorothiazide (HYDRODIURIL) 12.5 MG tablet TAKE 1 TABLET BY MOUTH DAILY AS  NEEDED 90 tablet 3   levothyroxine (SYNTHROID) 50 MCG tablet Take 1 tablet (50 mcg total) by mouth daily. 90 tablet 3   mirtazapine (REMERON) 15 MG tablet TAKE 1 TABLET BY MOUTH AT  BEDTIME 90 tablet 3   nitroGLYCERIN (NITROSTAT) 0.4 MG SL tablet Place 0.4 mg under the tongue every 5 (five) minutes as needed for chest pain (x 3 doses).     sotalol (BETAPACE) 120 MG tablet TAKE ONE-HALF TABLET BY MOUTH  TWICE DAILY 90 tablet 3   traMADol (ULTRAM) 50 MG tablet TAKE 1/2 TO 1 TABLET BY MOUTH 3 TIMES A DAY AS NEEDED FOR PAIN 90 tablet 0   trimethoprim (TRIMPEX) 100 MG tablet  TAKE 1 TABLET BY MOUTH DAILY 90 tablet 3   No current facility-administered medications for this visit.    Allergies  Allergen Reactions   Amoxicillin Diarrhea    Weakness and atrial fib   Augmentin [Amoxicillin-Pot Clavulanate] Diarrhea    Weakness and atrial fib   Sulfonamide Derivatives     unknown    Review of Systems negative except from HPI and PMH  Physical Exam BP (!) 159/78 (BP Location: Right Arm, Patient Position: Sitting, Cuff Size: Normal)   Pulse (!) 144   Ht 5' 2"$  (1.575 m)   Wt 106 lb (48.1 kg)   SpO2 96%   BMI 19.39 kg/m   HR 61 Well developed and well nourished in no acute distress HENT normal Neck supple with JVP-flat Clear Device pocket well healed; without hematoma or erythema.  There is no tethering  Regular rate and rhythm, no *** gallop No ***/*** murmur Abd-soft with active BS No Clubbing cyanosis *** edema Skin-warm  and dry A & Oriented  Grossly normal sensory and motor function  ECG atrial pacing at 72 Intervals 20/12/40  Device function is abnormal. Programming changes device had reverted to unipolar on the atrial lead.  No clear reasons, the device was reprogrammed bipoloar.  Reviewed with the family. See Paceart for details     Assessment and  Plan  Atrial fibrillation with some undersensing  Sinus node dysfunction   Pacemaker-Medtronic   High risk medication surveillance-sotalol  Renal insufficiency Gd 5  Hypertension  Anemia chronic  Orthostatic intolerance//Falls  Recurrent falls.  Thought to be mechanical.  I asked again and the patient again refused physical therapy.  Discussed with her daughter that the risk of falls is major and perhaps someone in the home to assist her with walking or to be available with walking would be appropriate if it were affordable.  Remain very concerned about the sotalol with her renal insufficiency which is grade 4-5.  She has remained adamant about staying on it.  We will continue it at 60 mg twice daily.  Continue apixaban 2.5 mg twice daily.  Blood pressure remains elevated but we worry about the orthostasis.  Continue HydroDIURIL 12.5 taking as needed for edema

## 2022-06-08 NOTE — Patient Instructions (Signed)
Medication Instructions:  - Your physician recommends that you continue on your current medications as directed. Please refer to the Current Medication list given to you today.  *If you need a refill on your cardiac medications before your next appointment, please call your pharmacy*   Lab Work: - none ordered  If you have labs (blood work) drawn today and your tests are completely normal, you will receive your results only by: [place] (if you have MyChart) OR A paper copy in the mail If you have any lab test that is abnormal or we need to change your treatment, we will call you to review the results.   Testing/Procedures: - none ordered   Follow-Up: At [facility], you and your health needs are our priority.  As part of our continuing mission to provide you with exceptional heart care, we have created designated Provider Care Teams.  These Care Teams include your primary Cardiologist (physician) and Advanced Practice Providers (APPs -  Physician Assistants and Nurse Practitioners) who all work together to provide you with the care you need, when you need it.  We recommend signing up for the patient portal called "MyChart".  Sign up information is provided on this After Visit Summary.  MyChart is used to connect with patients for Virtual Visits (Telemedicine).  Patients are able to view lab/test results, encounter notes, upcoming appointments, etc.  Non-urgent messages can be sent to your provider as well.   To learn more about what you can do with MyChart, go to [email or web address].    Your next appointment:   1 year(s)  Provider:   Virl Axe, MD    Other Instructions N/a

## 2022-06-14 ENCOUNTER — Other Ambulatory Visit: Payer: Self-pay | Admitting: Internal Medicine

## 2022-06-14 NOTE — Telephone Encounter (Signed)
Last filled 03-18-22 #90 Last OV 01-06-22 Next OV 07-07-22 [facility]

## 2022-06-25 ENCOUNTER — Encounter: Payer: Self-pay | Admitting: Internal Medicine

## 2022-07-07 ENCOUNTER — Ambulatory Visit: Payer: Medicare Other | Admitting: Internal Medicine

## 2022-07-14 ENCOUNTER — Ambulatory Visit (INDEPENDENT_AMBULATORY_CARE_PROVIDER_SITE_OTHER): Payer: Medicare Other | Admitting: Internal Medicine

## 2022-07-14 ENCOUNTER — Encounter: Payer: Self-pay | Admitting: Internal Medicine

## 2022-07-14 VITALS — BP 102/60 | HR 78 | Temp 97.6°F | Ht 62.0 in | Wt 104.0 lb

## 2022-07-14 DIAGNOSIS — N184 Chronic kidney disease, stage 4 (severe): Secondary | ICD-10-CM

## 2022-07-14 DIAGNOSIS — I10 Essential (primary) hypertension: Secondary | ICD-10-CM | POA: Diagnosis not present

## 2022-07-14 DIAGNOSIS — E441 Mild protein-calorie malnutrition: Secondary | ICD-10-CM

## 2022-07-14 DIAGNOSIS — I480 Paroxysmal atrial fibrillation: Secondary | ICD-10-CM

## 2022-07-14 DIAGNOSIS — I5032 Chronic diastolic (congestive) heart failure: Secondary | ICD-10-CM | POA: Diagnosis not present

## 2022-07-14 DIAGNOSIS — E039 Hypothyroidism, unspecified: Secondary | ICD-10-CM

## 2022-07-14 LAB — RENAL FUNCTION PANEL
Albumin: 4.1 g/dL (ref 3.5–5.2)
BUN: 36 mg/dL — ABNORMAL HIGH (ref 6–23)
CO2: 31 mEq/L (ref 19–32)
Calcium: 9.8 mg/dL (ref 8.4–10.5)
Chloride: 101 mEq/L (ref 96–112)
Creatinine, Ser: 1.95 mg/dL — ABNORMAL HIGH (ref 0.40–1.20)
GFR: 20.77 mL/min — ABNORMAL LOW (ref >60.00)
Glucose, Bld: 91 mg/dL (ref 70–99)
Phosphorus: 3.3 mg/dL (ref 2.3–4.6)
Potassium: 4.2 mEq/L (ref 3.5–5.1)
Sodium: 138 mEq/L (ref 135–145)

## 2022-07-14 LAB — TSH: TSH: 0.7 u[IU]/mL (ref 0.35–5.50)

## 2022-07-14 LAB — T4, FREE: Free T4: 1.18 ng/dL (ref 0.60–1.60)

## 2022-07-14 NOTE — Assessment & Plan Note (Signed)
Weight is holding though well below her long standing typical Eating fair Continue the mirtazapine

## 2022-07-14 NOTE — Assessment & Plan Note (Signed)
BP Readings from Last 3 Encounters:  07/14/22 102/60  06/08/22 (!) 159/78  02/09/22 130/70   Episodic high level --but not here Will stop the HCTZ She will have the [place] check occasionally

## 2022-07-14 NOTE — Assessment & Plan Note (Signed)
No fluid overload. 

## 2022-07-14 NOTE — Assessment & Plan Note (Signed)
Now on lower levothyroxine dose--50 mcg Will recheck labs

## 2022-07-14 NOTE — Progress Notes (Signed)
Subjective:    Patient ID: Amanda [name], female    DOB: Nov 06, 1919, 89 y.o.   MRN: [id number]  HPI Here with daughter for follow up of multiple medical conditions  Not feeling great---feels tired No recent edema Can get dizzy if she gets up fast---no syncope  Still living in own apartment at [facility] No chest pain--but does get "strangled" at time trying to swallow. But no real trouble swallowing  Last GFR still 19 (stable)  Doesn't check BP  Was elevated with Dr Olin Pia visit  Is on the lower dose of the thyroid now  Current Outpatient Medications on File Prior to Visit  Medication Sig Dispense Refill   acetaminophen (TYLENOL) 500 MG tablet Take 500 mg by mouth every 8 (eight) hours as needed for mild pain.     cephALEXin (KEFLEX) 500 MG capsule TAKE 1 CAPSULE (500 MG TOTAL) BY MOUTH 3 (THREE) TIMES DAILY AS NEEDED. 20 capsule 2   cholecalciferol (VITAMIN D) 1000 UNITS tablet Take 1,000 Units by mouth daily.     ELIQUIS 2.5 MG TABS tablet TAKE 1 TABLET BY MOUTH TWICE  DAILY 180 tablet 3   hydrochlorothiazide (HYDRODIURIL) 12.5 MG tablet TAKE 1 TABLET BY MOUTH DAILY AS  NEEDED 90 tablet 3   levothyroxine (SYNTHROID) 50 MCG tablet Take 1 tablet (50 mcg total) by mouth daily. 90 tablet 3   mirtazapine (REMERON) 15 MG tablet TAKE 1 TABLET BY MOUTH AT  BEDTIME 90 tablet 3   nitroGLYCERIN (NITROSTAT) 0.4 MG SL tablet Place 0.4 mg under the tongue every 5 (five) minutes as needed for chest pain (x 3 doses).     sotalol (BETAPACE) 120 MG tablet TAKE ONE-HALF TABLET BY MOUTH  TWICE DAILY 90 tablet 3   traMADol (ULTRAM) 50 MG tablet TAKE 1/2 TO 1 TABLET BY MOUTH 3 TIMES A DAY AS NEEDED FOR PAIN 90 tablet 0   trimethoprim (TRIMPEX) 100 MG tablet TAKE 1 TABLET BY MOUTH DAILY 90 tablet 3   No current facility-administered medications on file prior to visit.    Allergies  Allergen Reactions   Amoxicillin Diarrhea    Weakness and atrial fib   Augmentin [Amoxicillin-Pot Clavulanate]  Diarrhea    Weakness and atrial fib   Sulfonamide Derivatives     unknown    Past Medical History:  Diagnosis Date   Atrial fibrillation (HCC)    Depression    GERD (gastroesophageal reflux disease)    Hyperlipidemia    Hypertension    Hypothyroidism    after Rx for thyroid cancer   Mitral regurgitation    Osteoarthritis    Pacemaker    Sleep disturbance    Urinary incontinence    usually from UTI's    Past Surgical History:  Procedure Laterality Date   BREAST BIOPSY  2002   BREAST LUMPECTOMY  1983   CATARACT EXTRACTION, BILATERAL     EP IMPLANTABLE DEVICE N/A 09/02/2015   Procedure: PPM Generator Changeout;  Surgeon: Evans Lance, MD;  Location: [facility];  Service: Cardiovascular;  Laterality: N/A;   Goiter Removed  1976   cancerous. Then got RAI   PACEMAKER INSERTION  5/07   after syncope   TONSILLECTOMY AND ADENOIDECTOMY  1937   VESICOVAGINAL FISTULA CLOSURE W/ TAH  1972    Family History  Problem Relation Age of Onset   Diabetes Mother    Stroke Sister    Coronary artery disease Brother    Prostate cancer Brother    Breast  cancer Neg Hx    Colon cancer Neg Hx     Social History   Socioeconomic History   Marital status: Widowed    Spouse name: Not on file   Number of children: 2   Years of education: Not on file   Highest education level: Not on file  Occupational History   Occupation: homemaker  Tobacco Use   Smoking status: Never    Passive exposure: Past   Smokeless tobacco: Never  Vaping Use   Vaping Use: Never used  Substance and Sexual Activity   Alcohol use: No   Drug use: No   Sexual activity: Not on file  Other Topics Concern   Not on file  Social History Narrative   Widowed 2002. 1 local daughter. Other in [place]. Was homemaker. Farmed and gardened.    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    Daughter [name]) is health care POA.    Discussed DNR and she requests    Would not want tube feeds if cognitively unaware   Social  Determinants of Health   Financial Resource Strain: Not on file  Food Insecurity: Not on file  Transportation Needs: Not on file  Physical Activity: Not on file  Stress: Not on file  Social Connections: Not on file  Intimate Partner Violence: Not on file   Review of Systems No N/V No diarrhea No sig pain Eating okay--no sig weight change     Objective:   Physical Exam Constitutional:      Appearance: Normal appearance.  Cardiovascular:     Rate and Rhythm: Normal rate and regular rhythm.     Heart sounds: No murmur heard.    No gallop.  Pulmonary:     Effort: Pulmonary effort is normal.     Breath sounds: Normal breath sounds. No wheezing or rales.  Musculoskeletal:     Cervical back: Neck supple.     Right lower leg: No edema.     Left lower leg: No edema.  Lymphadenopathy:     Cervical: No cervical adenopathy.  Neurological:     Mental Status: She is alert.            Assessment & Plan:

## 2022-07-14 NOTE — Assessment & Plan Note (Signed)
Has been stable Might cause some fatigue--but no action on this Stopping HCTZ may actually make this improve

## 2022-07-14 NOTE — Assessment & Plan Note (Addendum)
Still regular on the sotalol--but likely paced On apixaban 2.5 bid

## 2022-07-27 DIAGNOSIS — Z23 Encounter for immunization: Secondary | ICD-10-CM | POA: Diagnosis not present

## 2022-08-24 DIAGNOSIS — H6123 Impacted cerumen, bilateral: Secondary | ICD-10-CM | POA: Diagnosis not present

## 2022-08-24 DIAGNOSIS — H903 Sensorineural hearing loss, bilateral: Secondary | ICD-10-CM | POA: Diagnosis not present

## 2022-09-06 ENCOUNTER — Telehealth: Payer: Self-pay

## 2022-09-06 NOTE — Patient Outreach (Signed)
  Care Coordination   Initial Visit Note   09/06/2022 Name: Kaylana Schmeiser MRN: [id number] DOB: 01-09-1920  Loyal Wach is a 89 y.o. year old female who sees Karie Schwalbe, MD for primary care. I  spoke with daughter/ Deniece Ree  What matters to the patients health and wellness today?  Daughter states patient is receiving what she needs at her living facility [facility].  Denies patient having any further needs at this time.     Goals Addressed             This Visit's Progress    COMPLETED: Care coordination activities - no follow up needed.       Interventions Today    Flowsheet Row Most Recent Value  General Interventions   General Interventions Discussed/Reviewed General Interventions Discussed  [Care coordination services discussed. SDOH survey completed.  Annual wellness visit discussed. Advised to contact provider office to schedule.  Advised to contact primary care provider office  if care coordination services needed in the future]              SDOH assessments and interventions completed:  Yes  SDOH Interventions Today    Flowsheet Row Most Recent Value  SDOH Interventions   Food Insecurity Interventions Intervention Not Indicated  Housing Interventions Intervention Not Indicated  Transportation Interventions Intervention Not Indicated        Care Coordination Interventions:  Yes, provided   Follow up plan: No further intervention required.   Encounter Outcome:  Pt. Visit Completed   George Ina RN,BSN,CCM [facility] Care Coordination [phone] direct line

## 2022-09-12 ENCOUNTER — Other Ambulatory Visit: Payer: Self-pay | Admitting: Internal Medicine

## 2022-09-13 NOTE — Telephone Encounter (Signed)
Last filled 06-14-21 #90 Last OV 07-14-22 No Future OV [facility]

## 2022-09-23 ENCOUNTER — Ambulatory Visit (INDEPENDENT_AMBULATORY_CARE_PROVIDER_SITE_OTHER): Payer: Medicare Other

## 2022-09-23 DIAGNOSIS — I495 Sick sinus syndrome: Secondary | ICD-10-CM | POA: Diagnosis not present

## 2022-09-24 LAB — CUP PACEART REMOTE DEVICE CHECK
Battery Impedance: 1273 Ohm
Battery Remaining Longevity: 49 mo
Battery Voltage: 2.77 V
Brady Statistic AP VP Percent: 1 %
Brady Statistic AP VS Percent: 98 %
Brady Statistic AS VP Percent: 0 %
Brady Statistic AS VS Percent: 1 %
Date Time Interrogation Session: 20240606114339
Implantable Lead Connection Status: 753985
Implantable Lead Connection Status: 753985
Implantable Lead Implant Date: 20070524
Implantable Lead Implant Date: 20070524
Implantable Lead Location: 753859
Implantable Lead Location: 753860
Implantable Lead Model: 5076
Implantable Lead Model: 5568
Implantable Pulse Generator Implant Date: 20170516
Lead Channel Impedance Value: 458 Ohm
Lead Channel Impedance Value: 655 Ohm
Lead Channel Setting Pacing Amplitude: 2 V
Lead Channel Setting Pacing Amplitude: 2.5 V
Lead Channel Setting Pacing Pulse Width: 0.4 ms
Lead Channel Setting Sensing Sensitivity: 4 mV
Zone Setting Status: 755011
Zone Setting Status: 755011

## 2022-10-15 NOTE — Progress Notes (Signed)
Remote pacemaker transmission.   

## 2022-10-20 ENCOUNTER — Encounter: Payer: Self-pay | Admitting: Student

## 2022-10-20 ENCOUNTER — Ambulatory Visit: Payer: Medicare Other | Admitting: Student

## 2022-10-20 VITALS — BP 124/76 | HR 74 | Temp 98.6°F | Ht 62.0 in | Wt 106.5 lb

## 2022-10-20 DIAGNOSIS — N3090 Cystitis, unspecified without hematuria: Secondary | ICD-10-CM | POA: Diagnosis not present

## 2022-10-20 DIAGNOSIS — N184 Chronic kidney disease, stage 4 (severe): Secondary | ICD-10-CM

## 2022-10-20 DIAGNOSIS — I10 Essential (primary) hypertension: Secondary | ICD-10-CM

## 2022-10-20 DIAGNOSIS — I700 Atherosclerosis of aorta: Secondary | ICD-10-CM

## 2022-10-20 DIAGNOSIS — E039 Hypothyroidism, unspecified: Secondary | ICD-10-CM

## 2022-10-20 DIAGNOSIS — N2581 Secondary hyperparathyroidism of renal origin: Secondary | ICD-10-CM | POA: Diagnosis not present

## 2022-10-20 DIAGNOSIS — R739 Hyperglycemia, unspecified: Secondary | ICD-10-CM

## 2022-10-20 DIAGNOSIS — I209 Angina pectoris, unspecified: Secondary | ICD-10-CM

## 2022-10-20 DIAGNOSIS — F39 Unspecified mood [affective] disorder: Secondary | ICD-10-CM | POA: Diagnosis not present

## 2022-10-20 NOTE — Progress Notes (Signed)
Location:   [facility]    Place of Service:   [facility]   Provider: Alasia Enge  Code Status: DNR Goals of Care:     10/20/2022    1:01 PM  Advanced Directives  Does Patient Have a Medical Advance Directive? Yes  Type of Estate agent of [place];Living will  Does patient want to make changes to medical advance directive? No - Patient declined  Copy of Healthcare Power of Attorney in Chart? No - copy requested     Chief Complaint  Patient presents with   Establish Care    New patient to establish care. Patient does not feel good, unable to give more specific details. Last week patient had swelling and pain on the right side (wrist, ankle, and foot). NCIR verified. Moderate fall risk.  Here with daughter, Rivka Barbara. Wears hearing aids.     HPI: Patient is a 89 y.o. female seen today for medical management of chronic diseases.  Her daughter Rivka Barbara is here helping with history.   Multimorbity. :  Hx of thyroid cancer and now on levothyroxine.  Atrial Fibrillation since 2008 - On eliquis, no signs of bleeding at this times.  Moderate  protein deficiency - mirtazapine for mood, sleep. Sometimes drinks. Frozen waffles, grits, oatmeal.  Depression - daughter states mirtazapine helps. Frequent Dysuria - Trimethoprim is daily and then when she has a problem she takes it for a couple of days. This is for comfort which is important.  0Restless legs - Tramadol has helped with her symptoms.   Mentation  Having more trouble with dates and recently has ha She does not manage her finances, but she does keep cash for groceries.   She gets dressed, laundry,  Does everything but tuck the covers under her bed. She gets help with making the bed and vacuum.   HHA 3x a week 15 minutes because her daughter wants to know. So she can get some things done and everything is going okay. Her duahter has noticed that she does not answer calls as well as she previously did.    Mobility  She uses the Rollator walker all the time. Before the rollator she had more falls. She has had less falls. No recent falls tho their knowledge.   She has lived at [facility] for 14 years. She was from [place]. Elementary education. She cooks her meals and heats it in the microwave.   Brings her joy: Randie Heinz great grandchildren, sleeping. She has two daughters one in [place] and one in [place]. 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   She has had wrist and foot pain lately. She goes out to lunch with her daughter once a week. She doesn't have much interaction. The drop in visits have helped a bit as well.   DNR and NO Dialysis.  Past Medical History:  Diagnosis Date   Atrial fibrillation (HCC)    Depression    GERD (gastroesophageal reflux disease)    Hyperlipidemia    Hypertension    Hypothyroidism    after Rx for thyroid cancer   Mitral regurgitation    Osteoarthritis    Pacemaker    Scarlet fever    Sleep disturbance    Urinary incontinence    usually from UTI's    Past Surgical History:  Procedure Laterality Date   BREAST BIOPSY  2002   BREAST LUMPECTOMY  1983  CATARACT EXTRACTION, BILATERAL     EP IMPLANTABLE DEVICE N/A 09/02/2015   Procedure: PPM Generator Changeout;  Surgeon: Marinus Maw, MD;  Location: [facility];  Service: Cardiovascular;  Laterality: N/A;   Goiter Removed  1976   cancerous. Then got RAI   PACEMAKER INSERTION  5/07   after syncope   TONSILLECTOMY AND ADENOIDECTOMY  1937   VESICOVAGINAL FISTULA CLOSURE W/ TAH  1972    Allergies  Allergen Reactions   Amoxicillin Diarrhea    Weakness and atrial fib   Augmentin [Amoxicillin-Pot Clavulanate] Diarrhea    Weakness and atrial fib   Sulfonamide Derivatives     unknown    Outpatient Encounter Medications as of 10/20/2022  Medication Sig   acetaminophen (TYLENOL) 500 MG tablet  Take 500 mg by mouth every 8 (eight) hours as needed for mild pain.   cephALEXin (KEFLEX) 500 MG capsule TAKE 1 CAPSULE (500 MG TOTAL) BY MOUTH 3 (THREE) TIMES DAILY AS NEEDED.   cholecalciferol (VITAMIN D) 1000 UNITS tablet Take 1,000 Units by mouth daily.   ELIQUIS 2.5 MG TABS tablet TAKE 1 TABLET BY MOUTH TWICE  DAILY   hydrochlorothiazide (MICROZIDE) 12.5 MG capsule Take 12.5 mg by mouth as needed (For swelling).   levothyroxine (SYNTHROID) 50 MCG tablet Take 1 tablet (50 mcg total) by mouth daily.   mirtazapine (REMERON) 15 MG tablet TAKE 1 TABLET BY MOUTH AT  BEDTIME   nitroGLYCERIN (NITROSTAT) 0.4 MG SL tablet Place 0.4 mg under the tongue every 5 (five) minutes as needed for chest pain (x 3 doses).   sotalol (BETAPACE) 120 MG tablet TAKE ONE-HALF TABLET BY MOUTH  TWICE DAILY   traMADol (ULTRAM) 50 MG tablet Take 50 mg by mouth daily.   trimethoprim (TRIMPEX) 100 MG tablet TAKE 1 TABLET BY MOUTH DAILY   [DISCONTINUED] traMADol (ULTRAM) 50 MG tablet TAKE 1/2 TO 1 TABLET BY MOUTH 3 TIMES A DAY AS NEEDED FOR PAIN   No facility-administered encounter medications on file as of 10/20/2022.    Review of Systems:  Review of Systems  Health Maintenance  Topic Date Due   Zoster Vaccines- Shingrix (1 of 2) Never done   DEXA SCAN  Never done   COVID-19 Vaccine (6 - 2023-24 season) 09/21/2022   INFLUENZA VACCINE  11/18/2022   Medicare Annual Wellness (AWV)  01/07/2023   DTaP/Tdap/Td (3 - Td or Tdap) 04/08/2031   Pneumonia Vaccine 35+ Years old  Completed   HPV VACCINES  Aged Out    Physical Exam: Vitals:   10/20/22 1248  BP: 124/76  Pulse: 74  Temp: 98.6 F (37 C)  TempSrc: Temporal  SpO2: 95%  Weight: 106 lb 8 oz (48.3 kg)  Height: 5\' 2"  (1.575 m)   Body mass index is 19.48 kg/m. Physical Exam Vitals reviewed.  Constitutional:      Appearance: Normal appearance.  Cardiovascular:     Rate and Rhythm: Normal rate.     Pulses: Normal pulses.  Pulmonary:     Effort:  Pulmonary effort is normal.  Abdominal:     General: Abdomen is flat.     Palpations: Abdomen is soft.  Neurological:     Mental Status: She is alert and oriented to person, place, and time.     Comments: SLUMS 18/30     Labs reviewed: Basic Metabolic Panel: Recent Labs    01/06/22 1239 07/14/22 1306  NA 140 138  K 4.0 4.2  CL 102 101  CO2 32 31  GLUCOSE 129* 91  BUN 38* 36*  CREATININE 2.05* 1.95*  CALCIUM 10.1 9.8  PHOS 3.4 3.3  TSH 0.14* 0.70   Liver Function Tests: Recent Labs    01/06/22 1239 07/14/22 1306  AST 20  --   ALT 9  --   ALKPHOS 94  --   BILITOT 0.6  --   PROT 6.6  --   ALBUMIN 3.7  3.7 4.1   No results for input(s): "LIPASE", "AMYLASE" in the last 8760 hours. No results for input(s): "AMMONIA" in the last 8760 hours. CBC: Recent Labs    01/06/22 1239  WBC 4.7  HGB 9.8*  HCT 29.5*  MCV 85.6  PLT 181.0   Lipid Panel: No results for input(s): "CHOL", "HDL", "LDLCALC", "TRIG", "CHOLHDL", "LDLDIRECT" in the last 8760 hours. No results found for: "HGBA1C"  Procedures since last visit: CUP PACEART REMOTE DEVICE CHECK  Result Date: 09/24/2022 Scheduled remote reviewed. Normal device function.  Hx of PAF, longest duration 3hrs , overall controlled rates Burden 0.7%, Eliquis per PA report Next remote 91 days. LA, CV[facility]   Assessment/Plan 1. Chronic renal disease, stage IV (HCC) Patient's renal disease has been stable. Will collect labs quarterly for monitoring. Encourage hydration. Would not want HD in the future.   2. Hyperglycemia A1c ordered  3. Subgaleal hemorrhage 11/20/2019 without significant deficits. Continue to monitor. No recent falls.   4. Episodic mood disorder (HCC) Patient continues to have a low mood. Difficult to complete PHQ-9 due to poor hearing and participation, however scored an 8 without completing. Patient is on mirtazapine to help with mood declined medication changes at this time continue to monitor.   5.  Thoracic aorta atherosclerosis (HCC) Continue Eliquis  6. Secondary hyperparathyroidism of renal origin [facility]) Check vitamin D, Calcium, PTH levels.   7. Angina pectoris, unspecified (HCC) Denies pain at this time, continue to monitor.   8. Hypertension BP well-controlled on current regimen, continue hydrochlorothiazide.   9. Hypothyroidism Most recent TSH at goal. Continue levothyroxine.   10. Recurrent Cystitis Continue suppressive therapy. Takes PRN keflex for 2-3 days with dysuria with previous provider. Discussed risks and family agrees to continue.   Labs/tests ordered:  * No order type specified * Next appt:  Visit date not found  I spent greater than 60 minutes for the care of this patient in face to face time, chart review, clinical documentation, patient education.

## 2022-10-28 DIAGNOSIS — N184 Chronic kidney disease, stage 4 (severe): Secondary | ICD-10-CM | POA: Diagnosis not present

## 2022-10-28 DIAGNOSIS — R739 Hyperglycemia, unspecified: Secondary | ICD-10-CM | POA: Diagnosis not present

## 2022-10-29 ENCOUNTER — Encounter: Payer: Self-pay | Admitting: Student

## 2022-10-29 LAB — COMPLETE METABOLIC PANEL WITH GFR
AG Ratio: 1.3 (calc) (ref 1.0–2.5)
ALT: 7 U/L (ref 6–29)
AST: 18 U/L (ref 10–35)
Albumin: 3.9 g/dL (ref 3.6–5.1)
Alkaline phosphatase (APISO): 76 U/L (ref 37–153)
BUN/Creatinine Ratio: 18 (calc) (ref 6–22)
BUN: 34 mg/dL — ABNORMAL HIGH (ref 7–25)
CO2: 32 mmol/L (ref 20–32)
Calcium: 9.9 mg/dL (ref 8.6–10.4)
Chloride: 98 mmol/L (ref 98–110)
Creat: 1.92 mg/dL — ABNORMAL HIGH (ref 0.60–0.95)
Globulin: 2.9 g/dL (calc) (ref 1.9–3.7)
Glucose, Bld: 87 mg/dL (ref 65–99)
Potassium: 4.4 mmol/L (ref 3.5–5.3)
Sodium: 135 mmol/L (ref 135–146)
Total Bilirubin: 0.7 mg/dL (ref 0.2–1.2)
Total Protein: 6.8 g/dL (ref 6.1–8.1)
eGFR: 23 mL/min/{1.73_m2} — ABNORMAL LOW (ref >=60)

## 2022-10-29 LAB — CBC WITH DIFFERENTIAL/PLATELET
Absolute Monocytes: 593 cells/uL (ref 200–950)
Basophils Absolute: 60 cells/uL (ref 0–200)
Basophils Relative: 1.3 %
Eosinophils Absolute: 18 cells/uL (ref 15–500)
Eosinophils Relative: 0.4 %
HCT: 31 % — ABNORMAL LOW (ref 35.0–45.0)
Hemoglobin: 10.2 g/dL — ABNORMAL LOW (ref 11.7–15.5)
Lymphs Abs: 1053 cells/uL (ref 850–3900)
MCH: 29 pg (ref 27.0–33.0)
MCHC: 32.9 g/dL (ref 32.0–36.0)
MCV: 88.1 fL (ref 80.0–100.0)
MPV: 9.9 fL (ref 7.5–12.5)
Monocytes Relative: 12.9 %
Neutro Abs: 2875 cells/uL (ref 1500–7800)
Neutrophils Relative %: 62.5 %
Platelets: 222 10*3/uL (ref 140–400)
RBC: 3.52 10*6/uL — ABNORMAL LOW (ref 3.80–5.10)
RDW: 13.6 % (ref 11.0–15.0)
Total Lymphocyte: 22.9 %
WBC: 4.6 10*3/uL (ref 3.8–10.8)

## 2022-10-29 LAB — HEMOGLOBIN A1C
Hgb A1c MFr Bld: 6.3 % of total Hgb — ABNORMAL HIGH (ref <5.7)
Mean Plasma Glucose: 134 mg/dL
eAG (mmol/L): 7.4 mmol/L

## 2022-12-12 ENCOUNTER — Other Ambulatory Visit: Payer: Self-pay | Admitting: Internal Medicine

## 2022-12-16 ENCOUNTER — Other Ambulatory Visit: Payer: Self-pay

## 2022-12-16 ENCOUNTER — Telehealth: Payer: Medicare Other

## 2022-12-16 MED ORDER — TRAMADOL HCL 50 MG PO TABS: 50.0000 mg | ORAL_TABLET | Freq: Every day | ORAL | 0 refills | Status: DC

## 2022-12-16 NOTE — Telephone Encounter (Addendum)
Amanda [name] with [facility] called stating that levothyroxine was switched to a different manufacturer. Just want to verify that okay to change to different manufacturer from amneal to lupin. [facility]: [phone]  Message sent to Dr. Earnestine Mealing

## 2022-12-16 NOTE — Telephone Encounter (Signed)
Patient's daughter called stating that patient needs refill on tramadol 50mg . She also stated that patient will be completely out of medication on Saturday. She states that patient usually gets 90 day supply.  Medication has been pended and sent to Dr. Earnestine Mealing

## 2022-12-17 NOTE — Telephone Encounter (Signed)
Returned call. Okay to send new brand.

## 2022-12-23 ENCOUNTER — Ambulatory Visit (INDEPENDENT_AMBULATORY_CARE_PROVIDER_SITE_OTHER): Payer: Medicare Other

## 2022-12-23 DIAGNOSIS — I495 Sick sinus syndrome: Secondary | ICD-10-CM

## 2022-12-23 LAB — CUP PACEART REMOTE DEVICE CHECK
Battery Impedance: 1358 Ohm
Battery Remaining Longevity: 48 mo
Battery Voltage: 2.77 V
Brady Statistic AP VP Percent: 1 %
Brady Statistic AP VS Percent: 98 %
Brady Statistic AS VP Percent: 0 %
Brady Statistic AS VS Percent: 1 %
Date Time Interrogation Session: 20240905123134
Implantable Lead Connection Status: 753985
Implantable Lead Connection Status: 753985
Implantable Lead Implant Date: 20070524
Implantable Lead Implant Date: 20070524
Implantable Lead Location: 753859
Implantable Lead Location: 753860
Implantable Lead Model: 5076
Implantable Lead Model: 5568
Implantable Pulse Generator Implant Date: 20170516
Lead Channel Impedance Value: 444 Ohm
Lead Channel Impedance Value: 690 Ohm
Lead Channel Setting Pacing Amplitude: 2 V
Lead Channel Setting Pacing Amplitude: 2.5 V
Lead Channel Setting Pacing Pulse Width: 0.4 ms
Lead Channel Setting Sensing Sensitivity: 2.8 mV
Zone Setting Status: 755011
Zone Setting Status: 755011

## 2023-01-03 NOTE — Progress Notes (Signed)
Remote pacemaker transmission.   

## 2023-01-06 DIAGNOSIS — H6123 Impacted cerumen, bilateral: Secondary | ICD-10-CM | POA: Diagnosis not present

## 2023-01-06 DIAGNOSIS — H903 Sensorineural hearing loss, bilateral: Secondary | ICD-10-CM | POA: Diagnosis not present

## 2023-01-11 ENCOUNTER — Other Ambulatory Visit: Payer: Self-pay | Admitting: Student

## 2023-01-11 NOTE — Telephone Encounter (Signed)
Patient has request refill on medication Tramadol 50mg . Patient medication last refilled 12/16/2022. Patient has No contract on file. Medication pend and sent to PCP Earnestine Mealing, MD for approval.

## 2023-02-07 ENCOUNTER — Telehealth: Payer: Self-pay | Admitting: Student

## 2023-02-07 DIAGNOSIS — N179 Acute kidney failure, unspecified: Secondary | ICD-10-CM | POA: Diagnosis not present

## 2023-02-07 DIAGNOSIS — R350 Frequency of micturition: Secondary | ICD-10-CM | POA: Diagnosis not present

## 2023-02-07 DIAGNOSIS — R451 Restlessness and agitation: Secondary | ICD-10-CM | POA: Diagnosis not present

## 2023-02-07 LAB — BASIC METABOLIC PANEL
BUN: 44 — AB (ref 4–21)
CO2: 31 — AB (ref 13–22)
Chloride: 99 (ref 99–108)
Creatinine: 2.1 — AB (ref 0.5–1.1)
Glucose: 142
Potassium: 4.2 meq/L (ref 3.5–5.1)
Sodium: 138 (ref 137–147)

## 2023-02-07 LAB — HEPATIC FUNCTION PANEL
ALT: 8 U/L (ref 7–35)
AST: 20 (ref 13–35)
Alkaline Phosphatase: 70 (ref 25–125)
Bilirubin, Total: 0.8

## 2023-02-07 LAB — CBC AND DIFFERENTIAL
HCT: 34 — AB (ref 36–46)
Hemoglobin: 11.2 — AB (ref 12.0–16.0)
Platelets: 226 10*3/uL (ref 150–400)
WBC: 5.5

## 2023-02-07 LAB — COMPREHENSIVE METABOLIC PANEL
Albumin: 4.2 (ref 3.5–5.0)
Calcium: 9.9 (ref 8.7–10.7)
Globulin: 2.8
eGFR: 20

## 2023-02-07 LAB — CBC: RBC: 3.79 — AB (ref 3.87–5.11)

## 2023-02-07 LAB — TSH: TSH: 18.47 — AB (ref 0.41–5.90)

## 2023-02-07 NOTE — Telephone Encounter (Signed)
Received call from on campus nurse Ma Rings that patient has been restless and increased pain in her legs. She has no other specific   She has been up all night as well   Advised to increase tramadol to BID and then collect UA and culture, CBC, and CMP. Will also check her TSH. She is also performing a COVID test  Advised to increase mirtapazine at night to 30 mg nightly.   Patient has a follow up appointment on Wednesday in person.

## 2023-02-08 ENCOUNTER — Encounter: Payer: Self-pay | Admitting: Student

## 2023-02-09 ENCOUNTER — Ambulatory Visit: Payer: Medicare Other | Admitting: Student

## 2023-02-09 ENCOUNTER — Encounter: Payer: Self-pay | Admitting: Student

## 2023-02-09 VITALS — BP 118/64 | HR 64 | Temp 97.5°F | Ht 62.0 in | Wt 101.0 lb

## 2023-02-09 DIAGNOSIS — N184 Chronic kidney disease, stage 4 (severe): Secondary | ICD-10-CM

## 2023-02-09 DIAGNOSIS — E039 Hypothyroidism, unspecified: Secondary | ICD-10-CM | POA: Diagnosis not present

## 2023-02-09 DIAGNOSIS — F03A3 Unspecified dementia, mild, with mood disturbance: Secondary | ICD-10-CM | POA: Diagnosis not present

## 2023-02-09 MED ORDER — LEVOTHYROXINE SODIUM 88 MCG PO TABS: 88.0000 ug | ORAL_TABLET | Freq: Every day | ORAL | 0 refills | Status: DC

## 2023-02-09 MED ORDER — TRAZODONE HCL 50 MG PO TABS: 25.0000 mg | ORAL_TABLET | Freq: Every day | ORAL | 1 refills | Status: DC

## 2023-02-09 NOTE — Progress Notes (Unsigned)
Location:   [facility]   Place of Service:   [facility]  Provider: Sydnee Cabal  Code Status: DNR Goals of Care:     02/09/2023    2:06 PM  Advanced Directives  Does Patient Have a Medical Advance Directive? Yes  Type of Estate agent of [place];Living will;Out of facility DNR (pink MOST or yellow form)  Does patient want to make changes to medical advance directive? No - Patient declined  Copy of Healthcare Power of Attorney in Chart? Yes - validated most recent copy scanned in chart (See row information)     Chief Complaint  Patient presents with   Medical Management of Chronic Issues    3 month follow-up. Here with daughter. Pill bottles present. Patient tells her daughter that she feels that she should no longer be alone. Patient c/o internal tremors, hurting all over, not interested in self care, and not sleeping. Moderate fall risk.     HPI: Patient is a 89 y.o. female seen today for medical management of chronic diseases.    Her daughter is here and helps with history  Patient states, I feel like I'm wasting away. She can't seem to do everything anymroe and a lot of her routines have been changed. Cleaning her apt. She needed some help with showering. Cleaning her apartment. She feels like she is giving up and now needs some help. [place] She talked to her daughter about going to her home. There's support here. She would rather she does that as well.   CKD IV wondering if that has contributed to increase in agitation.   She drinks a little water at a time.  Daughter tries to encourage hydration as often as possible.  Some weight loss noted 5 pounds in the last 3 months.  She is not sleeping well, but mirtazapine is starting to help. Tramadol in the morning and evening has helped. She has taken some melatonin 5mg  nightly as well. Last night was the firs tnight she has slept. She adjusts the heat. Saying she has to walk around.   She isn't  sleeping as much during the day.   She usually sleeps well at night and naps during the day, so this is an acute change.  No BM for the last day or so. Heart has felt like it is racing. 5 lb weight loss.    Past Medical History:  Diagnosis Date   Atrial fibrillation (HCC)    Depression    GERD (gastroesophageal reflux disease)    Hyperlipidemia    Hypertension    Hypothyroidism    after Rx for thyroid cancer   Mitral regurgitation    Osteoarthritis    Osteoporosis    Pacemaker    Scarlet fever    Sleep disturbance    Thyroid cancer (HCC) 1976   Urinary incontinence    usually from UTI's    Past Surgical History:  Procedure Laterality Date   ABDOMINAL HYSTERECTOMY  1972   BREAST BIOPSY  04/19/2000   BREAST LUMPECTOMY  04/19/1981   CATARACT EXTRACTION, BILATERAL     EP IMPLANTABLE DEVICE N/A 09/02/2015   Procedure: PPM Generator Changeout;  Surgeon: Marinus Maw, MD;  Location: [facility];  Service: Cardiovascular;  Laterality: N/A;   Goiter Removed  04/19/1974   cancerous. Then got RAI   PACEMAKER INSERTION  08/17/2005   after syncope   TONSILLECTOMY AND ADENOIDECTOMY  04/20/1935   VESICOVAGINAL FISTULA CLOSURE W/  TAH  04/19/1970    Allergies  Allergen Reactions   Amoxicillin Diarrhea    Weakness and atrial fib   Augmentin [Amoxicillin-Pot Clavulanate] Diarrhea    Weakness and atrial fib   Sulfonamide Derivatives     unknown    Outpatient Encounter Medications as of 02/09/2023  Medication Sig   acetaminophen (TYLENOL) 500 MG tablet Take 500 mg by mouth every 8 (eight) hours as needed for mild pain.   cephALEXin (KEFLEX) 500 MG capsule TAKE 1 CAPSULE (500 MG TOTAL) BY MOUTH 3 (THREE) TIMES DAILY AS NEEDED.   cholecalciferol (VITAMIN D) 1000 UNITS tablet Take 1,000 Units by mouth daily.   ELIQUIS 2.5 MG TABS tablet TAKE 1 TABLET BY MOUTH TWICE  DAILY   hydrochlorothiazide (MICROZIDE) 12.5 MG capsule Take 12.5 mg by mouth as needed (For swelling).    Melatonin 5 MG CAPS Take 1 capsule by mouth as needed. " Chewable"   mirtazapine (REMERON) 15 MG tablet Take 30 mg by mouth at bedtime.   nitroGLYCERIN (NITROSTAT) 0.4 MG SL tablet Place 0.4 mg under the tongue every 5 (five) minutes as needed for chest pain (x 3 doses).   sotalol (BETAPACE) 120 MG tablet TAKE ONE-HALF TABLET BY MOUTH  TWICE DAILY   traMADol (ULTRAM) 50 MG tablet Take 50 mg by mouth 2 (two) times daily.   traZODone (DESYREL) 50 MG tablet Take 0.5 tablets (25 mg total) by mouth at bedtime.   trimethoprim (TRIMPEX) 100 MG tablet TAKE 1 TABLET BY MOUTH DAILY   [DISCONTINUED] levothyroxine (SYNTHROID) 50 MCG tablet Take 1 tablet (50 mcg total) by mouth daily.   levothyroxine (SYNTHROID) 88 MCG tablet Take 1 tablet (88 mcg total) by mouth daily.   [DISCONTINUED] mirtazapine (REMERON) 15 MG tablet TAKE 1 TABLET BY MOUTH AT  BEDTIME (Patient not taking: Reported on 02/09/2023)   [DISCONTINUED] traMADol (ULTRAM) 50 MG tablet TAKE 1 TABLET BY MOUTH EVERY DAY   No facility-administered encounter medications on file as of 02/09/2023.    Review of Systems:  Review of Systems  Health Maintenance  Topic Date Due   Zoster Vaccines- Shingrix (1 of 2) Never done   DEXA SCAN  Never done   INFLUENZA VACCINE  11/18/2022   Medicare Annual Wellness (AWV)  01/07/2023   DTaP/Tdap/Td (3 - Td or Tdap) 04/08/2031   Pneumonia Vaccine 19+ Years old  Completed   COVID-19 Vaccine  Completed   HPV VACCINES  Aged Out    Physical Exam: Vitals:   02/09/23 1401  BP: 118/64  Pulse: 64  Temp: (!) 97.5 F (36.4 C)  TempSrc: Temporal  SpO2: 98%  Weight: 101 lb (45.8 kg)  Height: 5\' 2"  (1.575 m)   Body mass index is 18.47 kg/m. Physical Exam Constitutional:      Appearance: Normal appearance.  Cardiovascular:     Rate and Rhythm: Normal rate and regular rhythm.     Pulses: Normal pulses.     Heart sounds: Normal heart sounds.  Pulmonary:     Effort: Pulmonary effort is normal.   Abdominal:     General: Abdomen is flat. Bowel sounds are normal.     Palpations: Abdomen is soft.  Musculoskeletal:        General: No swelling or tenderness.  Skin:    General: Skin is warm and dry.     Capillary Refill: Capillary refill takes 2 to 3 seconds.  Neurological:     Mental Status: She is alert and oriented to person, place, and time.  Psychiatric:        Mood and Affect: Mood normal.     Labs reviewed: Basic Metabolic Panel: Recent Labs    07/14/22 1306 10/28/22 0848 02/07/23 0000  NA 138 135 138  K 4.2 4.4 4.2  CL 101 98 99  CO2 31 32 31*  GLUCOSE 91 87  --   BUN 36* 34* 44*  CREATININE 1.95* 1.92* 2.1*  CALCIUM 9.8 9.9 9.9  PHOS 3.3  --   --   TSH 0.70  --  18.47*   Liver Function Tests: Recent Labs    07/14/22 1306 10/28/22 0848 02/07/23 0000  AST  --  18 20  ALT  --  7 8  ALKPHOS  --   --  70  BILITOT  --  0.7  --   PROT  --  6.8  --   ALBUMIN 4.1  --  4.2   No results for input(s): "LIPASE", "AMYLASE" in the last 8760 hours. No results for input(s): "AMMONIA" in the last 8760 hours. CBC: Recent Labs    10/28/22 0848 02/07/23 0000  WBC 4.6 5.5  NEUTROABS 2,875  --   HGB 10.2* 11.2*  HCT 31.0* 34*  MCV 88.1  --   PLT 222 226   Lipid Panel: No results for input(s): "CHOL", "HDL", "LDLCALC", "TRIG", "CHOLHDL", "LDLDIRECT" in the last 8760 hours. Lab Results  Component Value Date   HGBA1C 6.3 (H) 10/28/2022    Procedures since last visit: No results found.  Assessment/Plan Hypothyroidism, unspecified type - Plan: levothyroxine (SYNTHROID) 88 MCG tablet, T4 AND TSH  Mild dementia with mood disturbance, unspecified dementia type (HCC) - Plan: traZODone (DESYREL) 50 MG tablet  Chronic renal disease, stage IV (HCC) - Plan: Basic Metabolic Panel with eGFR Patient presents with 4 days of increased agitation.  Some concern that this is a reflection of her overall decline in independence and function, however with evaluation for  secondary causes, urinalysis negative for infection.  TSH notable to be extremely elevated at 18.  CKD 4 GFR 20, BUN elevated at 44.  Discussed prognosis of CKD 4.  Family declines dialysis.  Plan to adjust levothyroxine dose to 88 mcg at this time recheck levels in 4 weeks at patient's home.  Supportive medication for acute disturbance, follow-up in 1 month.    Labs/tests ordered:  * No order type specified * Next appt:  Visit date not found  I spent greater than 40 minutes for the care of this patient in face to face time, chart review, clinical documentation, patient education.

## 2023-02-09 NOTE — Patient Instructions (Addendum)
Please take the higher dose of levothyroxine 3 hours after meals (5PM dinner, take medication after 8PM).   Please take 1/2 tablet of trazodone daily as needed for agitation. 1 tablet nightly for agitation as needed. If 1/2 tablet is too sedating, cut into 1/4.   Labs on Monday 03/07/2023 (drawn at home) and will see Dr.Beamer on Wednesday 03/09/2023

## 2023-02-10 ENCOUNTER — Encounter: Payer: Self-pay | Admitting: Student

## 2023-02-10 DIAGNOSIS — F03A3 Unspecified dementia, mild, with mood disturbance: Secondary | ICD-10-CM | POA: Insufficient documentation

## 2023-02-11 ENCOUNTER — Telehealth: Payer: Self-pay | Admitting: Student

## 2023-02-11 DIAGNOSIS — N184 Chronic kidney disease, stage 4 (severe): Secondary | ICD-10-CM

## 2023-02-11 DIAGNOSIS — Z515 Encounter for palliative care: Secondary | ICD-10-CM

## 2023-02-11 DIAGNOSIS — M150 Primary generalized (osteo)arthritis: Secondary | ICD-10-CM

## 2023-02-11 MED ORDER — OXYCODONE HCL 5 MG PO TABS: 5.0000 mg | ORAL_TABLET | Freq: Four times a day (QID) | ORAL | 0 refills | Status: DC | PRN

## 2023-02-11 NOTE — Telephone Encounter (Signed)
Patient with worsening pain in the last 48 hours. Requesting increase in pain medication regimen. Plan to send oxycodone for pain q6 Prn. Family to discuss further whether or not they would like to be followed by hospice versus palliative care. Previous discussion of prognosis, with weight loss and previous physical exam concern for dehydration and  poor PO intake. Will wait for their call to determine transition to hospice versus palliative care.

## 2023-02-22 ENCOUNTER — Telehealth: Payer: Self-pay

## 2023-02-22 MED ORDER — MIRTAZAPINE 30 MG PO TABS: 30.0000 mg | ORAL_TABLET | Freq: Every day | ORAL | 3 refills | Status: DC

## 2023-02-22 NOTE — Telephone Encounter (Signed)
Patients daughter Rivka Barbara called requesting a refill on Mitrazapine to [facility]. [name] asked that the tablets be changed to 30 mg versus 2, 15 mg tablets  Please advise

## 2023-02-22 NOTE — Telephone Encounter (Signed)
Rx sent 

## 2023-02-22 NOTE — Telephone Encounter (Signed)
Amanda Mealing, MD  You35 minutes ago (3:53 PM)   VB Agree with refill request to be sent at updated dose to [facility]. Please que this refill.      High risk or very high risk warning populated when attempting to refill medication. RX request sent to PCP for review and approval if warranted.

## 2023-03-02 ENCOUNTER — Other Ambulatory Visit: Payer: Self-pay

## 2023-03-02 DIAGNOSIS — Z515 Encounter for palliative care: Secondary | ICD-10-CM

## 2023-03-02 DIAGNOSIS — M150 Primary generalized (osteo)arthritis: Secondary | ICD-10-CM

## 2023-03-02 DIAGNOSIS — N184 Chronic kidney disease, stage 4 (severe): Secondary | ICD-10-CM

## 2023-03-02 MED ORDER — OXYCODONE HCL 5 MG PO TABS: 5.0000 mg | ORAL_TABLET | Freq: Four times a day (QID) | ORAL | 0 refills | Status: DC | PRN

## 2023-03-02 NOTE — Telephone Encounter (Signed)
Patient is requesting a refill of the following medications: Requested Prescriptions   Pending Prescriptions Disp Refills   oxyCODONE (ROXICODONE) 5 MG immediate release tablet 30 tablet 0    Sig: Take 1 tablet (5 mg total) by mouth every 6 (six) hours as needed for severe pain (pain score 7-10).    Date of last refill: 02/11/2023  Refill amount: 30  Treatment agreement date: not on file, notation made on next appointment

## 2023-03-04 ENCOUNTER — Other Ambulatory Visit: Payer: Self-pay | Admitting: Student

## 2023-03-04 DIAGNOSIS — F03A3 Unspecified dementia, mild, with mood disturbance: Secondary | ICD-10-CM

## 2023-03-04 NOTE — Telephone Encounter (Signed)
Pharmacy requested refill.  Pended Rx and sent to Dr. Sydnee Cabal for approval due to HIGH ALERT Warning.

## 2023-03-07 DIAGNOSIS — E039 Hypothyroidism, unspecified: Secondary | ICD-10-CM | POA: Diagnosis not present

## 2023-03-07 LAB — TSH: TSH: 4.04 (ref 0.41–5.90)

## 2023-03-09 ENCOUNTER — Encounter: Payer: Self-pay | Admitting: Student

## 2023-03-09 ENCOUNTER — Ambulatory Visit: Payer: Medicare Other | Admitting: Student

## 2023-03-09 VITALS — BP 116/68 | HR 64 | Temp 98.2°F | Ht 62.0 in | Wt 104.0 lb

## 2023-03-09 DIAGNOSIS — I10 Essential (primary) hypertension: Secondary | ICD-10-CM | POA: Diagnosis not present

## 2023-03-09 DIAGNOSIS — E039 Hypothyroidism, unspecified: Secondary | ICD-10-CM

## 2023-03-09 DIAGNOSIS — Z515 Encounter for palliative care: Secondary | ICD-10-CM | POA: Diagnosis not present

## 2023-03-09 DIAGNOSIS — F03A3 Unspecified dementia, mild, with mood disturbance: Secondary | ICD-10-CM | POA: Diagnosis not present

## 2023-03-09 DIAGNOSIS — N184 Chronic kidney disease, stage 4 (severe): Secondary | ICD-10-CM

## 2023-03-09 NOTE — Progress Notes (Signed)
Location:   [facility]    Place of Service:   [facility]  Provider: Sydnee Cabal  Code Status: DNR Goals of Care:     03/09/2023    3:43 PM  Advanced Directives  Does Patient Have a Medical Advance Directive? Yes  Type of Advance Directive Out of facility DNR (pink MOST or yellow form)  Does patient want to make changes to medical advance directive? No - Patient declined  Copy of Healthcare Power of Attorney in Chart? Yes - validated most recent copy scanned in chart (See row information)  Pre-existing out of facility DNR order (yellow form or pink MOST form) Yellow form placed in chart (order not valid for inpatient use)     Chief Complaint  Patient presents with   Follow-up    4 week follow-up and discuss labs. Sign treatment agreement for oxycodone. Discuss need for shingrix, flu vaccine and DEXA or post pone it patient refuses or is not a candidate.     HPI: Patient is a 89 y.o. female seen today for medical management of chronic diseases.    Pain is well-controlled with oxycodone 5 mg. Takes it at night. Not taking tramadol.   Sleep is improved from before. She goes to bed at [facility].   She cries out at night per her daughter,   She doesn't want to sleep alone at night. She has been in touch with social work and Marine scientist care. She was told she wound't be able to be seen in clinic if she goes on hospice.   They went to [facility] ([place]) and while it is nice, they thought it could be hard to move.   Thyroid level is in normal range.   Past Medical History:  Diagnosis Date   Atrial fibrillation (HCC)    Depression    GERD (gastroesophageal reflux disease)    Hyperlipidemia    Hypertension    Hypothyroidism    after Rx for thyroid cancer   Mitral regurgitation    Osteoarthritis    Osteoporosis    Pacemaker    Scarlet fever    Sleep disturbance    Thyroid cancer (HCC) 1976   Urinary incontinence    usually from UTI's    Past Surgical History:  Procedure  Laterality Date   ABDOMINAL HYSTERECTOMY  1972   BREAST BIOPSY  04/19/2000   BREAST LUMPECTOMY  04/19/1981   CATARACT EXTRACTION, BILATERAL     EP IMPLANTABLE DEVICE N/A 09/02/2015   Procedure: PPM Generator Changeout;  Surgeon: Marinus Maw, MD;  Location: [facility];  Service: Cardiovascular;  Laterality: N/A;   Goiter Removed  04/19/1974   cancerous. Then got RAI   PACEMAKER INSERTION  08/17/2005   after syncope   TONSILLECTOMY AND ADENOIDECTOMY  04/20/1935   VESICOVAGINAL FISTULA CLOSURE W/ TAH  04/19/1970    Allergies  Allergen Reactions   Amoxicillin Diarrhea    Weakness and atrial fib   Augmentin [Amoxicillin-Pot Clavulanate] Diarrhea    Weakness and atrial fib   Sulfonamide Derivatives     unknown    Outpatient Encounter Medications as of 03/09/2023  Medication Sig   acetaminophen (TYLENOL) 500 MG tablet Take 500 mg by mouth every 8 (eight) hours as needed for mild pain.   cephALEXin (KEFLEX) 500 MG capsule TAKE 1 CAPSULE (500 MG TOTAL) BY MOUTH 3 (THREE) TIMES DAILY AS NEEDED.   cholecalciferol (VITAMIN D) 1000 UNITS tablet Take 1,000 Units by mouth daily.   ELIQUIS 2.5 MG  TABS tablet TAKE 1 TABLET BY MOUTH TWICE  DAILY   hydrochlorothiazide (MICROZIDE) 12.5 MG capsule Take 12.5 mg by mouth as needed (For swelling).   levothyroxine (SYNTHROID) 88 MCG tablet Take 1 tablet (88 mcg total) by mouth daily.   Melatonin 5 MG CAPS Take 1 capsule by mouth as needed. " Chewable"   mirtazapine (REMERON) 30 MG tablet Take 1 tablet (30 mg total) by mouth at bedtime.   nitroGLYCERIN (NITROSTAT) 0.4 MG SL tablet Place 0.4 mg under the tongue every 5 (five) minutes as needed for chest pain (x 3 doses).   oxyCODONE (ROXICODONE) 5 MG immediate release tablet Take 1 tablet (5 mg total) by mouth every 6 (six) hours as needed for severe pain (pain score 7-10).   sotalol (BETAPACE) 120 MG tablet TAKE ONE-HALF TABLET BY MOUTH  TWICE DAILY   trimethoprim (TRIMPEX) 100 MG tablet TAKE  1 TABLET BY MOUTH DAILY   [DISCONTINUED] traZODone (DESYREL) 50 MG tablet TAKE 1/2 TABLET (25MG ) BY MOUTH AT BEDTIME.   [DISCONTINUED] mirtazapine (REMERON) 15 MG tablet Take 30 mg by mouth at bedtime. (Patient not taking: Reported on 02/22/2023)   [DISCONTINUED] traMADol (ULTRAM) 50 MG tablet Take 50 mg by mouth 2 (two) times daily. (Patient not taking: Reported on 03/09/2023)   [DISCONTINUED] traZODone (DESYREL) 50 MG tablet Take 0.5 tablets (25 mg total) by mouth at bedtime.   No facility-administered encounter medications on file as of 03/09/2023.    Review of Systems:  Review of Systems  Health Maintenance  Topic Date Due   Zoster Vaccines- Shingrix (1 of 2) Never done   DEXA SCAN  Never done   INFLUENZA VACCINE  11/18/2022   DTaP/Tdap/Td (3 - Td or Tdap) 04/08/2031   Pneumonia Vaccine 33+ Years old  Completed   COVID-19 Vaccine  Completed   HPV VACCINES  Aged Out    Physical Exam: Vitals:   03/09/23 1537  BP: 116/68  Pulse: 64  Temp: 98.2 F (36.8 C)  TempSrc: Temporal  SpO2: 97%  Weight: 104 lb (47.2 kg)  Height: 5\' 2"  (1.575 m)   Body mass index is 19.02 kg/m. Physical Exam Constitutional:      Appearance: Normal appearance.  HENT:     Ears:     Comments: HOH Cardiovascular:     Rate and Rhythm: Normal rate.     Pulses: Normal pulses.  Pulmonary:     Effort: Pulmonary effort is normal.     Breath sounds: Normal breath sounds.  Abdominal:     General: Abdomen is flat.     Palpations: Abdomen is soft.  Neurological:     Mental Status: She is alert. Mental status is at baseline.     Labs reviewed: Basic Metabolic Panel: Recent Labs    07/14/22 1306 10/28/22 0848 02/07/23 0000 03/07/23 0000  NA 138 135 138  --   K 4.2 4.4 4.2  --   CL 101 98 99  --   CO2 31 32 31*  --   GLUCOSE 91 87  --   --   BUN 36* 34* 44*  --   CREATININE 1.95* 1.92* 2.1*  --   CALCIUM 9.8 9.9 9.9  --   PHOS 3.3  --   --   --   TSH 0.70  --  18.47* 4.04   Liver  Function Tests: Recent Labs    07/14/22 1306 10/28/22 0848 02/07/23 0000  AST  --  18 20  ALT  --  7 8  ALKPHOS  --   --  70  BILITOT  --  0.7  --   PROT  --  6.8  --   ALBUMIN 4.1  --  4.2   No results for input(s): "LIPASE", "AMYLASE" in the last 8760 hours. No results for input(s): "AMMONIA" in the last 8760 hours. CBC: Recent Labs    10/28/22 0848 02/07/23 0000  WBC 4.6 5.5  NEUTROABS 2,875  --   HGB 10.2* 11.2*  HCT 31.0* 34*  MCV 88.1  --   PLT 222 226   Lipid Panel: No results for input(s): "CHOL", "HDL", "LDLCALC", "TRIG", "CHOLHDL", "LDLDIRECT" in the last 8760 hours. Lab Results  Component Value Date   HGBA1C 6.3 (H) 10/28/2022    Procedures since last visit: No results found.  Assessment/Plan Mild dementia with mood disturbance, unspecified dementia type (HCC)  Chronic renal disease, stage IV (HCC)  Palliative care patient  Hypothyroidism, unspecified type  Essential hypertension Patient with continued decline in memory.  She continues to live at home alone with her daughter spending the night with her.  Significant concern for safety living alone.  Patient to likely move to higher level of care in the upcoming weeks given significant renal disease and current goals of care.  Blood pressure well-controlled.  Follow-up in 3 months or sooner if necessary. Labs/tests ordered:  * No order type specified * Next appt:  Visit date not found

## 2023-03-09 NOTE — Patient Instructions (Addendum)
Great seeing you today.   

## 2023-03-12 ENCOUNTER — Encounter: Payer: Self-pay | Admitting: Student

## 2023-03-24 ENCOUNTER — Ambulatory Visit (INDEPENDENT_AMBULATORY_CARE_PROVIDER_SITE_OTHER): Payer: Medicare Other

## 2023-03-24 DIAGNOSIS — I495 Sick sinus syndrome: Secondary | ICD-10-CM | POA: Diagnosis not present

## 2023-03-30 ENCOUNTER — Telehealth: Payer: Self-pay

## 2023-03-30 ENCOUNTER — Ambulatory Visit (INDEPENDENT_AMBULATORY_CARE_PROVIDER_SITE_OTHER): Payer: Medicare Other

## 2023-03-30 DIAGNOSIS — N184 Chronic kidney disease, stage 4 (severe): Secondary | ICD-10-CM | POA: Diagnosis not present

## 2023-03-30 DIAGNOSIS — Z515 Encounter for palliative care: Secondary | ICD-10-CM

## 2023-03-30 DIAGNOSIS — R52 Pain, unspecified: Secondary | ICD-10-CM | POA: Diagnosis not present

## 2023-03-30 DIAGNOSIS — R54 Age-related physical debility: Secondary | ICD-10-CM | POA: Diagnosis not present

## 2023-03-30 DIAGNOSIS — I5030 Unspecified diastolic (congestive) heart failure: Secondary | ICD-10-CM | POA: Diagnosis not present

## 2023-03-30 DIAGNOSIS — I495 Sick sinus syndrome: Secondary | ICD-10-CM

## 2023-03-30 DIAGNOSIS — M150 Primary generalized (osteo)arthritis: Secondary | ICD-10-CM

## 2023-03-30 DIAGNOSIS — I480 Paroxysmal atrial fibrillation: Secondary | ICD-10-CM | POA: Diagnosis not present

## 2023-03-30 DIAGNOSIS — Z7901 Long term (current) use of anticoagulants: Secondary | ICD-10-CM | POA: Diagnosis not present

## 2023-03-30 DIAGNOSIS — Z7189 Other specified counseling: Secondary | ICD-10-CM | POA: Diagnosis not present

## 2023-03-30 LAB — CUP PACEART REMOTE DEVICE CHECK
Battery Impedance: 1469 Ohm
Battery Remaining Longevity: 44 mo
Battery Voltage: 2.77 V
Brady Statistic AP VP Percent: 1 %
Brady Statistic AP VS Percent: 98 %
Brady Statistic AS VP Percent: 0 %
Brady Statistic AS VS Percent: 1 %
Date Time Interrogation Session: 20241211091555
Implantable Lead Connection Status: 753985
Implantable Lead Connection Status: 753985
Implantable Lead Implant Date: 20070524
Implantable Lead Implant Date: 20070524
Implantable Lead Location: 753859
Implantable Lead Location: 753860
Implantable Lead Model: 5076
Implantable Lead Model: 5568
Implantable Pulse Generator Implant Date: 20170516
Lead Channel Impedance Value: 485 Ohm
Lead Channel Impedance Value: 690 Ohm
Lead Channel Setting Pacing Amplitude: 2 V
Lead Channel Setting Pacing Amplitude: 2.5 V
Lead Channel Setting Pacing Pulse Width: 0.4 ms
Lead Channel Setting Sensing Sensitivity: 4 mV
Zone Setting Status: 755011
Zone Setting Status: 755011

## 2023-03-30 MED ORDER — OXYCODONE HCL 5 MG PO TABS: 5.0000 mg | ORAL_TABLET | Freq: Four times a day (QID) | ORAL | 0 refills | Status: DC | PRN

## 2023-03-30 NOTE — Telephone Encounter (Signed)
Patient is requesting a refill of the following medications: Requested Prescriptions   Pending Prescriptions Disp Refills   oxyCODONE (ROXICODONE) 5 MG immediate release tablet 30 tablet 0    Sig: Take 1 tablet (5 mg total) by mouth every 6 (six) hours as needed for severe pain (pain score 7-10).    Date of last refill: 03/01/22  Refill amount: 30  Treatment agreement date: 03/09/23  Patients daughter wanted to give Dr.Beamer a heads up that patient will be relocating to [facility] soon.

## 2023-03-31 ENCOUNTER — Other Ambulatory Visit: Payer: Self-pay | Admitting: Student

## 2023-03-31 DIAGNOSIS — E039 Hypothyroidism, unspecified: Secondary | ICD-10-CM

## 2023-04-04 ENCOUNTER — Non-Acute Institutional Stay (SKILLED_NURSING_FACILITY): Payer: Self-pay | Admitting: Student

## 2023-04-04 DIAGNOSIS — N184 Chronic kidney disease, stage 4 (severe): Secondary | ICD-10-CM

## 2023-04-04 DIAGNOSIS — I10 Essential (primary) hypertension: Secondary | ICD-10-CM

## 2023-04-04 DIAGNOSIS — N2581 Secondary hyperparathyroidism of renal origin: Secondary | ICD-10-CM

## 2023-04-04 DIAGNOSIS — F03A3 Unspecified dementia, mild, with mood disturbance: Secondary | ICD-10-CM | POA: Diagnosis not present

## 2023-04-04 DIAGNOSIS — I480 Paroxysmal atrial fibrillation: Secondary | ICD-10-CM

## 2023-04-04 DIAGNOSIS — E039 Hypothyroidism, unspecified: Secondary | ICD-10-CM | POA: Diagnosis not present

## 2023-04-04 DIAGNOSIS — F39 Unspecified mood [affective] disorder: Secondary | ICD-10-CM

## 2023-04-04 DIAGNOSIS — Z7901 Long term (current) use of anticoagulants: Secondary | ICD-10-CM | POA: Diagnosis not present

## 2023-04-04 DIAGNOSIS — Z515 Encounter for palliative care: Secondary | ICD-10-CM | POA: Diagnosis not present

## 2023-04-04 DIAGNOSIS — N3090 Cystitis, unspecified without hematuria: Secondary | ICD-10-CM | POA: Diagnosis not present

## 2023-04-04 DIAGNOSIS — Z95811 Presence of heart assist device: Secondary | ICD-10-CM | POA: Diagnosis not present

## 2023-04-04 NOTE — Progress Notes (Signed)
Provider:  Sydnee Cabal Location:  Other Nursing Home Room Number: [address] [phone] Place of Service:  SNF (31)  PCP: Earnestine Mealing, MD Patient Care Team: Earnestine Mealing, MD as PCP - General (Family Medicine) Antonieta Iba, MD as Consulting Physician (Cardiology) Duke Salvia, MD as Consulting Physician (Cardiology) Bud Face, MD as Referring Physician (Otolaryngology)  Extended Emergency Contact Information Primary Emergency Contact: Koger,[name] Address: [address]          [place], [place] Home Phone: [phone] Mobile Phone: [phone] Relation: Daughter Secondary Emergency Contact: Sallee [place] Home Phone: [phone] Relation: Daughter  Code Status: DNR Goals of Care: Advanced Directive information    03/09/2023    3:43 PM  Advanced Directives  Does Patient Have a Medical Advance Directive? Yes  Type of Advance Directive Out of facility DNR (pink MOST or yellow form)  Does patient want to make changes to medical advance directive? No - Patient declined  Copy of Healthcare Power of Attorney in Chart? Yes - validated most recent copy scanned in chart (See row information)  Pre-existing out of facility DNR order (yellow form or pink MOST form) Yellow form placed in chart (order not valid for inpatient use)      Chief Complaint  Patient presents with   LTC Nursing Home Admission    HPI: Patient is a 89 y.o. female seen today for admission to [facility].  Patient is an independent living resident who transition to higher level of care due to increased needs.  Family has been sleeping and are home for the last 4 months.  She is had increased disorientation and confusion.  Of note patient has chronic pain which is now well-controlled with oxycodone.  She also has severe kidney disease unclear prognosis family declines dialysis.  Past Medical History:  Diagnosis Date   Atrial fibrillation  (HCC)    Depression    GERD (gastroesophageal reflux disease)    Hyperlipidemia    Hypertension    Hypothyroidism    after Rx for thyroid cancer   Mitral regurgitation    Osteoarthritis    Osteoporosis    Pacemaker    Scarlet fever    Sleep disturbance    Thyroid cancer (HCC) 1976   Urinary incontinence    usually from UTI's   Past Surgical History:  Procedure Laterality Date   ABDOMINAL HYSTERECTOMY  1972   BREAST BIOPSY  04/19/2000   BREAST LUMPECTOMY  04/19/1981   CATARACT EXTRACTION, BILATERAL     EP IMPLANTABLE DEVICE N/A 09/02/2015   Procedure: PPM Generator Changeout;  Surgeon: Marinus Maw, MD;  Location: [facility] INVASIVE CV LAB;  Service: Cardiovascular;  Laterality: N/A;   Goiter Removed  04/19/1974   cancerous. Then got RAI   PACEMAKER INSERTION  08/17/2005   after syncope   TONSILLECTOMY AND ADENOIDECTOMY  04/20/1935   VESICOVAGINAL FISTULA CLOSURE W/ TAH  04/19/1970    reports that she has never smoked. She has been exposed to tobacco smoke. She has never used smokeless tobacco. She reports that she does not drink alcohol and does not use drugs. Social History   Socioeconomic History   Marital status: Widowed    Spouse name: Not on file   Number of children: 2   Years of education: Not on file   Highest education level: Not on file  Occupational History   Occupation: homemaker  Tobacco Use   Smoking status: Never    Passive exposure: Past  Smokeless tobacco: Never  Vaping Use   Vaping status: Never Used  Substance and Sexual Activity   Alcohol use: No   Drug use: No   Sexual activity: Not on file  Other Topics Concern   Not on file  Social History Narrative   Widowed 2002. 1 local daughter. Other in [place]. Was homemaker. Farmed and gardened.    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    Daughter Rivka Barbara) is health care POA.    Discussed DNR and she requests    Would not want tube feeds if cognitively unaware   Social Drivers of Health   Financial Resource  Strain: Not on file  Food Insecurity: No Food Insecurity (09/06/2022)   Hunger Vital Sign    Worried About Running Out of Food in the Last Year: Never true    Ran Out of Food in the Last Year: Never true  Transportation Needs: No Transportation Needs (09/06/2022)   PRAPARE - Administrator, Civil Service (Medical): No    Lack of Transportation (Non-Medical): No  Physical Activity: Not on file  Stress: Not on file  Social Connections: Not on file  Intimate Partner Violence: Not on file    Functional Status Survey:    Family History  Problem Relation Age of Onset   Diabetes Mother    Stroke Mother    Pneumonia Father    Stroke Sister 38   Esophageal cancer Sister 1   Leukemia Sister 13   Coronary artery disease Brother 59   Prostate cancer Brother 95   Breast cancer Neg Hx    Colon cancer Neg Hx     Health Maintenance  Topic Date Due   Zoster Vaccines- Shingrix (1 of 2) Never done   DEXA SCAN  Never done   INFLUENZA VACCINE  11/18/2022   COVID-19 Vaccine (6 - 2024-25 season) 12/19/2022   DTaP/Tdap/Td (3 - Td or Tdap) 04/08/2031   Pneumonia Vaccine 47+ Years old  Completed   HPV VACCINES  Aged Out    Allergies  Allergen Reactions   Amoxicillin Diarrhea    Weakness and atrial fib   Augmentin [Amoxicillin-Pot Clavulanate] Diarrhea    Weakness and atrial fib   Sulfonamide Derivatives     unknown    Outpatient Encounter Medications as of 04/04/2023  Medication Sig   acetaminophen (TYLENOL) 500 MG tablet Take 500 mg by mouth every 8 (eight) hours as needed for mild pain.   cephALEXin (KEFLEX) 500 MG capsule TAKE 1 CAPSULE (500 MG TOTAL) BY MOUTH 3 (THREE) TIMES DAILY AS NEEDED.   cholecalciferol (VITAMIN D) 1000 UNITS tablet Take 1,000 Units by mouth daily.   ELIQUIS 2.5 MG TABS tablet TAKE 1 TABLET BY MOUTH TWICE  DAILY   hydrochlorothiazide (MICROZIDE) 12.5 MG capsule Take 12.5 mg by mouth as needed (For swelling).   levothyroxine (SYNTHROID) 88 MCG  tablet TAKE 1 TABLET BY MOUTH DAILY   Melatonin 5 MG CAPS Take 1 capsule by mouth as needed. " Chewable"   mirtazapine (REMERON) 30 MG tablet Take 1 tablet (30 mg total) by mouth at bedtime.   nitroGLYCERIN (NITROSTAT) 0.4 MG SL tablet Place 0.4 mg under the tongue every 5 (five) minutes as needed for chest pain (x 3 doses).   oxyCODONE (ROXICODONE) 5 MG immediate release tablet Take 1 tablet (5 mg total) by mouth every 6 (six) hours as needed for severe pain (pain score 7-10).   sotalol (BETAPACE) 120 MG tablet TAKE ONE-HALF TABLET BY MOUTH  TWICE DAILY  trimethoprim (TRIMPEX) 100 MG tablet TAKE 1 TABLET BY MOUTH DAILY   No facility-administered encounter medications on file as of 04/04/2023.    Review of Systems  Vitals:   04/05/23 0656  BP: 135/75  Resp: 16  Temp: 97.7 F (36.5 C)  SpO2: 98%  Weight: 103 lb (46.7 kg)   Body mass index is 18.84 kg/m. Physical Exam Cardiovascular:     Rate and Rhythm: Normal rate.     Pulses: Normal pulses.  Pulmonary:     Effort: Pulmonary effort is normal.  Abdominal:     General: Abdomen is flat.     Palpations: Abdomen is soft.  Skin:    General: Skin is warm and dry.  Neurological:     Mental Status: She is alert. Mental status is at baseline.     Labs reviewed: Basic Metabolic Panel: Recent Labs    07/14/22 1306 10/28/22 0848 02/07/23 0000  NA 138 135 138  K 4.2 4.4 4.2  CL 101 98 99  CO2 31 32 31*  GLUCOSE 91 87  --   BUN 36* 34* 44*  CREATININE 1.95* 1.92* 2.1*  CALCIUM 9.8 9.9 9.9  PHOS 3.3  --   --    Liver Function Tests: Recent Labs    07/14/22 1306 10/28/22 0848 02/07/23 0000  AST  --  18 20  ALT  --  7 8  ALKPHOS  --   --  70  BILITOT  --  0.7  --   PROT  --  6.8  --   ALBUMIN 4.1  --  4.2   No results for input(s): "LIPASE", "AMYLASE" in the last 8760 hours. No results for input(s): "AMMONIA" in the last 8760 hours. CBC: Recent Labs    10/28/22 0848 02/07/23 0000  WBC 4.6 5.5  NEUTROABS  2,875  --   HGB 10.2* 11.2*  HCT 31.0* 34*  MCV 88.1  --   PLT 222 226   Cardiac Enzymes: No results for input(s): "CKTOTAL", "CKMB", "CKMBINDEX", "TROPONINI" in the last 8760 hours. BNP: Invalid input(s): "POCBNP" Lab Results  Component Value Date   HGBA1C 6.3 (H) 10/28/2022   Lab Results  Component Value Date   TSH 4.04 03/07/2023   Lab Results  Component Value Date   VITAMINB12 368 10/08/2016   No results found for: "FOLATE" No results found for: "IRON", "TIBC", "FERRITIN"  Imaging and Procedures obtained prior to SNF admission: CT Cervical Spine Wo Contrast Result Date: 04/07/2021 CLINICAL DATA:  Fall EXAM: CT CERVICAL SPINE WITHOUT CONTRAST TECHNIQUE: Multidetector CT imaging of the cervical spine was performed without intravenous contrast. Multiplanar CT image reconstructions were also generated. COMPARISON:  Cervical spine CT 03/03/2018 FINDINGS: Alignment: There is trace retrolisthesis of C5 on C6, unchanged. There is no other antero or retrolisthesis. There is no jumped or perched facets or other evidence of traumatic malalignment. Skull base and vertebrae: Skull base alignment is maintained. Vertebral body heights are preserved. There is no evidence of acute fracture. Soft tissues and spinal canal: No prevertebral fluid or swelling. No visible canal hematoma. Disc levels: There is multilevel intervertebral disc space narrowing with associated degenerative endplate change, most advanced at C4-C5 and C5-C6. There is multilevel facet arthropathy, most advanced on the left at C3-C4. The osseous spinal canal is patent. There is multilevel moderate to severe neural foraminal stenosis, overall worse on the left. Upper chest: The imaged lung apices are clear. Other: None. IMPRESSION: 1. No acute fracture or traumatic malalignment of the cervical  spine. 2. Multilevel degenerative changes as above. Electronically Signed   By: Lesia Hausen M.D.   On: 04/07/2021 08:51   CT HEAD WO  CONTRAST ( ) Result Date: 04/07/2021 CLINICAL DATA:  Fall, hit forehead EXAM: CT HEAD WITHOUT CONTRAST TECHNIQUE: Contiguous axial images were obtained from the base of the skull through the vertex without intravenous contrast. COMPARISON:  CT head 11/20/2019 FINDINGS: Brain: There is no evidence of acute intracranial hemorrhage, extra-axial fluid collection, or acute infarct. There is mild global parenchymal volume loss with commensurate enlargement of the ventricular system, unchanged. Confluent hypodensity in the subcortical and periventricular white matter likely reflects sequela of advanced chronic white matter microangiopathy. There is no solid mass lesion. There is no midline shift. Vascular: There is calcification of the bilateral cavernous ICAs and vertebral arteries. Skull: Normal. Negative for fracture or focal lesion. Sinuses/Orbits: There is complete opacification of the right frontal sinus, anterior ethmoid air cells, and partial opacification of the imaged right maxillary sinus, unchanged. The polypoid soft tissue lesion with smooth expansile bony remodeling in the right nasal cavity has slightly increased in size since 2019. Bilateral lens implants are in place. The globes and orbits are otherwise unremarkable. Other: There is minimal swelling over the left frontoparietal scalp and left forehead. There is unchanged opacification of the underpneumatized right mastoid air cells and middle ear cavity. IMPRESSION: 1. No acute intracranial pathology. 2. Stable global parenchymal volume loss and chronic white matter microangiopathy. 3. Minimal swelling over the left frontoparietal scalp and forehead no acute osseous abnormality. 4. Increased size of a polypoid soft tissue density lesion in the right nasal cavity since 2019. Consider direct visualization if not already performed. Electronically Signed   By: Lesia Hausen M.D.   On: 04/07/2021 08:46    Assessment/Plan Presence of heart assist device  [facility]), Chronic  Palliative care patient  Chronic renal disease, stage IV (HCC)  Mild dementia with mood disturbance, unspecified dementia type (HCC)  Hypothyroidism, unspecified type  Essential hypertension  Episodic mood disorder (HCC)  Secondary hyperparathyroidism of renal origin (HCC)  Recurrent cystitis  Long term (current) use of anticoagulants  Paroxysmal atrial fibrillation (HCC) Patient with history of dementia with recent progression of symptoms requiring more care and 24-hour surveillance and support.  She uses a rolling walker to ambulate.  No falls.  Currently on Eliquis for atrial fibrillation.  Rate controlled this bleeding.  Continue Eliquis 2.5 mg twice daily.  CKD stage IVb.  BP well-controlled with hydrochlorothiazide and sotalol.  Mood well-controlled with mirtazapine aiding with sleep support as well.  Pain well-controlled with oxycodone 5 mg.  Recurrent UTIs continue trimethoprim. DC Keflex as needed   Family/ staff Communication: Daughter, nursing  Labs/tests ordered: CBC, BMP

## 2023-04-05 ENCOUNTER — Encounter: Payer: Self-pay | Admitting: Student

## 2023-04-05 DIAGNOSIS — Z95811 Presence of heart assist device: Secondary | ICD-10-CM | POA: Insufficient documentation

## 2023-04-06 DIAGNOSIS — Z9181 History of falling: Secondary | ICD-10-CM | POA: Diagnosis not present

## 2023-04-06 DIAGNOSIS — F03A3 Unspecified dementia, mild, with mood disturbance: Secondary | ICD-10-CM | POA: Diagnosis not present

## 2023-04-06 DIAGNOSIS — R2689 Other abnormalities of gait and mobility: Secondary | ICD-10-CM | POA: Diagnosis not present

## 2023-04-06 DIAGNOSIS — M6281 Muscle weakness (generalized): Secondary | ICD-10-CM | POA: Diagnosis not present

## 2023-04-07 ENCOUNTER — Other Ambulatory Visit: Payer: Self-pay | Admitting: Nurse Practitioner

## 2023-04-07 DIAGNOSIS — Z515 Encounter for palliative care: Secondary | ICD-10-CM

## 2023-04-07 DIAGNOSIS — M150 Primary generalized (osteo)arthritis: Secondary | ICD-10-CM

## 2023-04-07 DIAGNOSIS — N184 Chronic kidney disease, stage 4 (severe): Secondary | ICD-10-CM

## 2023-04-07 MED ORDER — OXYCODONE HCL 5 MG PO TABS: 5.0000 mg | ORAL_TABLET | Freq: Four times a day (QID) | ORAL | 0 refills | Status: DC | PRN

## 2023-04-08 DIAGNOSIS — F03A3 Unspecified dementia, mild, with mood disturbance: Secondary | ICD-10-CM | POA: Diagnosis not present

## 2023-04-08 DIAGNOSIS — M6281 Muscle weakness (generalized): Secondary | ICD-10-CM | POA: Diagnosis not present

## 2023-04-08 DIAGNOSIS — Z9181 History of falling: Secondary | ICD-10-CM | POA: Diagnosis not present

## 2023-04-08 DIAGNOSIS — R2689 Other abnormalities of gait and mobility: Secondary | ICD-10-CM | POA: Diagnosis not present

## 2023-04-09 ENCOUNTER — Encounter: Payer: Self-pay | Admitting: Sports Medicine

## 2023-04-09 NOTE — Progress Notes (Unsigned)
Received a call from RN that pt is wheezing and sat 87% , pt on palliative care as per chart review Instructed the nurse to start albuterol nebuliser and if no improvement needs to go to ED and inform the family. Monitor vitals every shift Nurse called later , pt is stable febrile vitals stable, saturating 94% on RA

## 2023-04-14 DIAGNOSIS — Z7901 Long term (current) use of anticoagulants: Secondary | ICD-10-CM | POA: Diagnosis not present

## 2023-04-14 DIAGNOSIS — I5030 Unspecified diastolic (congestive) heart failure: Secondary | ICD-10-CM | POA: Diagnosis not present

## 2023-04-14 DIAGNOSIS — R52 Pain, unspecified: Secondary | ICD-10-CM | POA: Diagnosis not present

## 2023-04-14 DIAGNOSIS — R54 Age-related physical debility: Secondary | ICD-10-CM | POA: Diagnosis not present

## 2023-04-14 DIAGNOSIS — I480 Paroxysmal atrial fibrillation: Secondary | ICD-10-CM | POA: Diagnosis not present

## 2023-04-14 DIAGNOSIS — Z515 Encounter for palliative care: Secondary | ICD-10-CM | POA: Diagnosis not present

## 2023-04-14 DIAGNOSIS — Z7189 Other specified counseling: Secondary | ICD-10-CM | POA: Diagnosis not present

## 2023-04-14 DIAGNOSIS — N184 Chronic kidney disease, stage 4 (severe): Secondary | ICD-10-CM | POA: Diagnosis not present

## 2023-04-19 ENCOUNTER — Encounter: Payer: Self-pay | Admitting: Nurse Practitioner

## 2023-04-19 ENCOUNTER — Non-Acute Institutional Stay: Payer: Self-pay | Admitting: Nurse Practitioner

## 2023-04-19 DIAGNOSIS — M150 Primary generalized (osteo)arthritis: Secondary | ICD-10-CM

## 2023-04-19 DIAGNOSIS — N184 Chronic kidney disease, stage 4 (severe): Secondary | ICD-10-CM | POA: Diagnosis not present

## 2023-04-19 DIAGNOSIS — Z515 Encounter for palliative care: Secondary | ICD-10-CM

## 2023-04-19 DIAGNOSIS — Z0000 Encounter for general adult medical examination without abnormal findings: Secondary | ICD-10-CM | POA: Diagnosis not present

## 2023-04-19 DIAGNOSIS — Z23 Encounter for immunization: Secondary | ICD-10-CM | POA: Diagnosis not present

## 2023-04-19 MED ORDER — OXYCODONE HCL 5 MG PO TABS: 5.0000 mg | ORAL_TABLET | Freq: Every day | ORAL | 0 refills | Status: DC

## 2023-04-19 NOTE — Progress Notes (Signed)
 Subjective:   Amanda [name] is a 89 y.o. female who presents for Medicare Annual (Subsequent) preventive examination.  Visit Complete: In person    Cardiac Risk Factors include: advanced age (>60men, >80 women);sedentary lifestyle;hypertension     Objective:    Today's Vitals   04/19/23 0954  BP: 138/75  Pulse: 63  Resp: 16  Temp: 97.7 F (36.5 C)  SpO2: 98%  Weight: 103 lb 12.8 oz (47.1 kg)  Height: 5' 2 (1.575 m)   Body mass index is 18.99 kg/m.     04/19/2023   10:19 AM 03/09/2023    3:43 PM 02/09/2023    2:06 PM 10/20/2022    1:01 PM 04/07/2021    9:07 AM 11/20/2019    5:46 PM 04/13/2019    7:52 AM  Advanced Directives  Does Patient Have a Medical Advance Directive? Yes Yes Yes Yes No Yes Yes  Type of Advance Directive Out of facility DNR (pink MOST or yellow form) Out of facility DNR (pink MOST or yellow form) Healthcare Power of [place];Living will;Out of facility DNR (pink MOST or yellow form) Healthcare Power of [place];Living will  Living will Out of facility DNR (pink MOST or yellow form)  Does patient want to make changes to medical advance directive? No - Patient declined No - Patient declined No - Patient declined No - Patient declined  No - Patient declined No - Patient declined  Copy of Healthcare Power of Attorney in Chart?  Yes - validated most recent copy scanned in chart (See row information) Yes - validated most recent copy scanned in chart (See row information) No - copy requested     Would patient like information on creating a medical advance directive?     No - Patient declined No - Patient declined No - Patient declined  Pre-existing out of facility DNR order (yellow form or pink MOST form)  Yellow form placed in chart (order not valid for inpatient use)         Current Medications (verified) Outpatient Encounter Medications as of 04/19/2023  Medication Sig   acetaminophen  (TYLENOL ) 500 MG tablet Take 500 mg by mouth every 8 (eight) hours as  needed for mild pain.   albuterol (ACCUNEB) 0.63 MG/3ML nebulizer solution Take 1 ampule by nebulization every 6 (six) hours as needed for wheezing.   cholecalciferol (VITAMIN D ) 1000 UNITS tablet Take 1,000 Units by mouth daily.   ELIQUIS  2.5 MG TABS tablet TAKE 1 TABLET BY MOUTH TWICE  DAILY   hydrochlorothiazide  (MICROZIDE ) 12.5 MG capsule Take 12.5 mg by mouth as needed (For swelling).   Lactobacillus (ACIDOPHILUS PO) Take 1 tablet by mouth daily.   levothyroxine  (SYNTHROID ) 88 MCG tablet TAKE 1 TABLET BY MOUTH DAILY   Melatonin 5 MG CAPS Take 1 capsule by mouth as needed.  Chewable   mirtazapine  (REMERON ) 30 MG tablet Take 1 tablet (30 mg total) by mouth at bedtime.   nitroGLYCERIN (NITROSTAT) 0.4 MG SL tablet Place 0.4 mg under the tongue every 5 (five) minutes as needed for chest pain (x 3 doses).   oxyCODONE  (ROXICODONE ) 5 MG immediate release tablet Take 1 tablet (5 mg total) by mouth every 6 (six) hours as needed for severe pain (pain score 7-10).   sotalol  (BETAPACE ) 120 MG tablet TAKE ONE-HALF TABLET BY MOUTH  TWICE DAILY   trimethoprim  (TRIMPEX ) 100 MG tablet TAKE 1 TABLET BY MOUTH DAILY   No facility-administered encounter medications on file as of 04/19/2023.    Allergies (verified) Amoxicillin ,  Augmentin  [amoxicillin -pot clavulanate], and Sulfonamide derivatives   History: Past Medical History:  Diagnosis Date   Atrial fibrillation (HCC)    Depression    GERD (gastroesophageal reflux disease)    Hyperlipidemia    Hypertension    Hypothyroidism    after Rx for thyroid  cancer   Mitral regurgitation    Osteoarthritis    Osteoporosis    Pacemaker    Scarlet fever    Sleep disturbance    Thyroid  cancer (HCC) 1976   Urinary incontinence    usually from UTI's   Past Surgical History:  Procedure Laterality Date   ABDOMINAL HYSTERECTOMY  1972   BREAST BIOPSY  04/19/2000   BREAST LUMPECTOMY  04/19/1981   CATARACT EXTRACTION, BILATERAL     EP IMPLANTABLE DEVICE  N/A 09/02/2015   Procedure: PPM Generator Changeout;  Surgeon: Danelle LELON Birmingham, MD;  Location: [facility];  Service: Cardiovascular;  Laterality: N/A;   Goiter Removed  04/19/1974   cancerous. Then got RAI   PACEMAKER INSERTION  08/17/2005   after syncope   TONSILLECTOMY AND ADENOIDECTOMY  04/20/1935   VESICOVAGINAL FISTULA CLOSURE W/ TAH  04/19/1970   Family History  Problem Relation Age of Onset   Diabetes Mother    Stroke Mother    Pneumonia Father    Stroke Sister 41   Esophageal cancer Sister 39   Leukemia Sister 25   Coronary artery disease Brother 77   Prostate cancer Brother 95   Breast cancer Neg Hx    Colon cancer Neg Hx    Social History   Socioeconomic History   Marital status: Widowed    Spouse name: Not on file   Number of children: 2   Years of education: Not on file   Highest education level: Not on file  Occupational History   Occupation: homemaker  Tobacco Use   Smoking status: Never    Passive exposure: Past   Smokeless tobacco: Never  Vaping Use   Vaping status: Never Used  Substance and Sexual Activity   Alcohol use: No   Drug use: No   Sexual activity: Not on file  Other Topics Concern   Not on file  Social History Narrative   Widowed 2002. 1 local daughter. Other in [place]. Was homemaker. Farmed and gardened.    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    Daughter Mort) is health care POA.    Discussed DNR and she requests    Would not want tube feeds if cognitively unaware   Social Drivers of Health   Financial Resource Strain: Not on file  Food Insecurity: No Food Insecurity (09/06/2022)   Hunger Vital Sign    Worried About Running Out of Food in the Last Year: Never true    Ran Out of Food in the Last Year: Never true  Transportation Needs: No Transportation Needs (09/06/2022)   PRAPARE - Administrator, Civil Service (Medical): No    Lack of Transportation (Non-Medical): No  Physical Activity: Not on file  Stress: Not on  file  Social Connections: Not on file    Tobacco Counseling Counseling given: Not Answered   Clinical Intake:  Pre-visit preparation completed: Yes  Pain : No/denies pain     BMI - recorded: 18            Activities of Daily Living    04/19/2023   12:05 PM 04/19/2023   12:03 PM  In your present state of health, do you have any difficulty performing  the following activities:  Hearing?  1  Vision?  1  Preparing Food and eating ? Y   Comment unable to prepare food, able to eat with difficulty     Patient Care Team: Abdul Fine, MD as PCP - General (Family Medicine) Perla Evalene PARAS, MD as Consulting Physician (Cardiology) Fernande Elspeth BROCKS, MD as Consulting Physician (Cardiology) Milissa Hamming, MD as Referring Physician (Otolaryngology)  Indicate any recent Medical Services you may have received from other than [facility] providers in the past year (date may be approximate).     Assessment:   This is a routine wellness examination for Lesli.  Hearing/Vision screen No results found.   Goals Addressed   None    Depression Screen    03/09/2023    3:41 PM 10/20/2022    1:50 PM 10/20/2022    1:01 PM 01/06/2022   12:21 PM 12/31/2020   12:13 PM 12/28/2019   11:18 AM 12/26/2018   11:19 AM  PHQ 2/9 Scores  PHQ - 2 Score 0 3 0      PHQ- 9 Score  8       Exception Documentation    Medical reason Medical reason Medical reason Medical reason    Fall Risk    04/19/2023   11:58 AM 03/09/2023    3:41 PM 02/09/2023    2:03 PM 10/20/2022    1:00 PM 01/06/2022   12:21 PM  Fall Risk   Falls in the past year? 1 1 1 1 1   Number falls in past yr: 0 0 1 1 0  Injury with Fall? 0 0 0 0 0  Risk for fall due to :  No Fall Risks No Fall Risks History of fall(s);Impaired balance/gait   Follow up  Falls evaluation completed Falls evaluation completed Falls evaluation completed     MEDICARE RISK AT HOME:    TIMED UP AND GO:  Was the test performed?  No    Cognitive  Function:        Immunizations Immunization History  Administered Date(s) Administered   Fluad Quad(high Dose 65+) 12/26/2018, 02/06/2019, 12/28/2019, 12/31/2020, 01/06/2022   Influenza Split 01/20/2011   Influenza Whole 01/24/2009, 01/21/2010   Influenza, High Dose Seasonal PF 02/14/2018   Influenza,inj,Quad PF,6+ Mos 01/09/2013, 01/15/2014, 02/05/2015, 02/09/2016, 02/02/2017   Moderna Covid-19 Fall Seasonal Vaccine 67yrs & older 07/27/2022   Moderna Sars-Covid-2 Vaccination 05/04/2019, 06/01/2019   Pfizer Covid-19 Vaccine Bivalent Booster 68yrs & up 01/08/2021   Pfizer(Comirnaty)Fall Seasonal Vaccine 12 years and older 02/20/2022   Pneumococcal Conjugate-13 07/16/2013   Pneumococcal Polysaccharide-23 04/19/2000, 08/16/2017   Td 04/19/2008   Tdap 04/07/2021    TDAP status: Up to date  Flu Vaccine status: Due, Education has been provided regarding the importance of this vaccine. Advised may receive this vaccine at local pharmacy or Health Dept. Aware to provide a copy of the vaccination record if obtained from local pharmacy or Health Dept. Verbalized acceptance and understanding.  Pneumococcal vaccine status: Up to date  Covid-19 vaccine status: Information provided on how to obtain vaccines.   Qualifies for Shingles Vaccine? Yes   Zostavax completed No   Shingrix Completed?: No.    Education has been provided regarding the importance of this vaccine. Patient has been advised to call insurance company to determine out of pocket expense if they have not yet received this vaccine. Advised may also receive vaccine at local pharmacy or Health Dept. Verbalized acceptance and understanding.  Screening Tests Health Maintenance  Topic Date Due  Zoster Vaccines- Shingrix (1 of 2) Never done   INFLUENZA VACCINE  11/18/2022   COVID-19 Vaccine (6 - 2024-25 season) 12/19/2022   DTaP/Tdap/Td (3 - Td or Tdap) 04/08/2031   Pneumonia Vaccine 62+ Years old  Completed   HPV VACCINES  Aged  Out   DEXA SCAN  Discontinued    Health Maintenance  Health Maintenance Due  Topic Date Due   Zoster Vaccines- Shingrix (1 of 2) Never done   INFLUENZA VACCINE  11/18/2022   COVID-19 Vaccine (6 - 2024-25 season) 12/19/2022    Colorectal cancer screening: No longer required.   Mammogram status: No longer required due to age.   Lung Cancer Screening: (Low Dose CT Chest recommended if Age 22-80 years, 20 pack-year currently smoking OR have quit w/in 15years.) does not qualify.   Lung Cancer Screening Referral: na  Additional Screening:  Hepatitis C Screening: does not qualify  Vision Screening: Recommended annual ophthalmology exams for early detection of glaucoma and other disorders of the eye. Is the patient up to date with their annual eye exam?  No  Who is the provider or what is the name of the office in which the patient attends annual eye exams? Does not wish to go to the eye doctor    Dental Screening: Recommended annual dental exams for proper oral hygiene   Community Resource Referral / Chronic Care Management: CRR required this visit?  No   CCM required this visit?  No     Plan:     I have personally reviewed and noted the following in the patient's chart:   Medical and social history Use of alcohol, tobacco or illicit drugs  Current medications and supplements including opioid prescriptions. Patient is currently taking opioid prescriptions. Information provided to patient regarding non-opioid alternatives. Patient advised to discuss non-opioid treatment plan with their provider. Functional ability and status Nutritional status Physical activity Advanced directives List of other physicians Hospitalizations, surgeries, and ER visits in previous 12 months Vitals Screenings to include cognitive, depression, and falls Referrals and appointments  In addition, I have reviewed and discussed with patient certain preventive protocols, quality metrics, and  best practice recommendations. A written personalized care plan for preventive services as well as general preventive health recommendations were provided to patient.     Harlene MARLA An, NP   04/19/2023

## 2023-04-19 NOTE — Patient Instructions (Signed)
  Amanda [name] , Thank you for taking time to come for your Medicare Wellness Visit. I appreciate your ongoing commitment to your health goals. Please review the following plan we discussed and let me know if I can assist you in the future.   This is a list of the screening recommended for you and due dates:  Health Maintenance  Topic Date Due   Zoster (Shingles) Vaccine (1 of 2) Never done   Flu Shot  11/18/2022   COVID-19 Vaccine (6 - 2024-25 season) 12/19/2022   DTaP/Tdap/Td vaccine (3 - Td or Tdap) 04/08/2031   Pneumonia Vaccine  Completed   HPV Vaccine  Aged Out   DEXA scan (bone density measurement)  Discontinued   Will get flu and shingles vaccines at [facility]

## 2023-04-25 ENCOUNTER — Non-Acute Institutional Stay (SKILLED_NURSING_FACILITY): Payer: Self-pay | Admitting: Student

## 2023-04-25 ENCOUNTER — Encounter: Payer: Self-pay | Admitting: Student

## 2023-04-25 DIAGNOSIS — N184 Chronic kidney disease, stage 4 (severe): Secondary | ICD-10-CM

## 2023-04-25 DIAGNOSIS — Z604 Social exclusion and rejection: Secondary | ICD-10-CM | POA: Diagnosis not present

## 2023-04-25 DIAGNOSIS — R600 Localized edema: Secondary | ICD-10-CM | POA: Diagnosis not present

## 2023-04-25 NOTE — Progress Notes (Signed)
 Location:  Other Nursing Home Room Number: [facility] [address] Place of Service:  SNF [phone]) Provider:  Abdul Abdul, Richerd, MD  Patient Care Team: Abdul Richerd, MD as PCP - General (Family Medicine) Perla Evalene PARAS, MD as Consulting Physician (Cardiology) Fernande Elspeth BROCKS, MD as Consulting Physician (Cardiology) Milissa Hamming, MD as Referring Physician (Otolaryngology)  Extended Emergency Contact Information Primary Emergency Contact: Koger,[name] Address: [address]          [place], [place] Home Phone: [phone] Mobile Phone: [phone] Relation: Daughter Secondary Emergency Contact: Vaughan,Carolyn  [place] Home Phone: [phone] Relation: Daughter  Code Status:  DNR Goals of care: Advanced Directive information    04/19/2023   10:19 AM  Advanced Directives  Does Patient Have a Medical Advance Directive? Yes  Type of Advance Directive Out of facility DNR (pink MOST or yellow form)  Does patient want to make changes to medical advance directive? No - Patient declined     Chief Complaint  Patient presents with   Leg Swelling   Medical Management of Chronic Conditions    HPI:  Pt is a 89 y.o. female seen today for a medical management of chronic conditions and acute concern for leg swelling.  Discussed the use of AI scribe software for clinical note transcription with the patient, who gave verbal consent to proceed.  History of Present Illness         The patient, aged 82, presents with persistent lower extremity edema. She reports no pain but expresses frustration with the swelling, which has not improved despite the use of unspecified medications. The patient has a history of similar edema in her family, with both her mother and sister having experienced the same issue. She has previously tried compression stockings but found them uncomfortable and difficult to put on. The patient also reports  difficulty with socializing due to hearing impairment. She expresses feelings of isolation and a sense of being a burden to others. Despite these challenges, the patient reports sleeping well and participating in some facility activities when able.  Past Medical History:  Diagnosis Date   Atrial fibrillation (HCC)    Depression    GERD (gastroesophageal reflux disease)    Hyperlipidemia    Hypertension    Hypothyroidism    after Rx for thyroid  cancer   Mitral regurgitation    Osteoarthritis    Osteoporosis    Pacemaker    Scarlet fever    Sleep disturbance    Thyroid  cancer (HCC) 1976   Urinary incontinence    usually from UTI's   Past Surgical History:  Procedure Laterality Date   ABDOMINAL HYSTERECTOMY  1972   BREAST BIOPSY  04/19/2000   BREAST LUMPECTOMY  04/19/1981   CATARACT EXTRACTION, BILATERAL     EP IMPLANTABLE DEVICE N/A 09/02/2015   Procedure: PPM Generator Changeout;  Surgeon: Danelle LELON Birmingham, MD;  Location: [facility];  Service: Cardiovascular;  Laterality: N/A;   Goiter Removed  04/19/1974   cancerous. Then got RAI   PACEMAKER INSERTION  08/17/2005   after syncope   TONSILLECTOMY AND ADENOIDECTOMY  04/20/1935   VESICOVAGINAL FISTULA CLOSURE W/ TAH  04/19/1970    Allergies  Allergen Reactions   Amoxicillin  Diarrhea    Weakness and atrial fib   Augmentin  [Amoxicillin -Pot Clavulanate] Diarrhea    Weakness and atrial fib   Sulfonamide Derivatives     unknown    Outpatient Encounter Medications as of 04/25/2023  Medication Sig  acetaminophen  (TYLENOL ) 500 MG tablet Take 500 mg by mouth every 8 (eight) hours as needed for mild pain.   albuterol (ACCUNEB) 0.63 MG/3ML nebulizer solution Take 1 ampule by nebulization every 6 (six) hours as needed for wheezing.   cholecalciferol (VITAMIN D ) 1000 UNITS tablet Take 1,000 Units by mouth daily.   ELIQUIS  2.5 MG TABS tablet TAKE 1 TABLET BY MOUTH TWICE  DAILY   hydrochlorothiazide  (MICROZIDE ) 12.5 MG capsule  Take 12.5 mg by mouth as needed (For swelling).   Lactobacillus (ACIDOPHILUS PO) Take 1 tablet by mouth daily.   levothyroxine  (SYNTHROID ) 88 MCG tablet TAKE 1 TABLET BY MOUTH DAILY   Melatonin 5 MG CAPS Take 1 capsule by mouth as needed.  Chewable   mirtazapine  (REMERON ) 30 MG tablet Take 1 tablet (30 mg total) by mouth at bedtime.   nitroGLYCERIN (NITROSTAT) 0.4 MG SL tablet Place 0.4 mg under the tongue every 5 (five) minutes as needed for chest pain (x 3 doses).   oxyCODONE  (ROXICODONE ) 5 MG immediate release tablet Take 1 tablet (5 mg total) by mouth at bedtime.   sotalol  (BETAPACE ) 120 MG tablet TAKE ONE-HALF TABLET BY MOUTH  TWICE DAILY   trimethoprim  (TRIMPEX ) 100 MG tablet TAKE 1 TABLET BY MOUTH DAILY   No facility-administered encounter medications on file as of 04/25/2023.    Review of Systems  Immunization History  Administered Date(s) Administered   Fluad Quad(high Dose 65+) 12/26/2018, 02/06/2019, 12/28/2019, 12/31/2020, 01/06/2022   Influenza Split 01/20/2011   Influenza Whole 01/24/2009, 01/21/2010   Influenza, High Dose Seasonal PF 02/14/2018   Influenza,inj,Quad PF,6+ Mos 01/09/2013, 01/15/2014, 02/05/2015, 02/09/2016, 02/02/2017   Moderna Covid-19 Fall Seasonal Vaccine 24yrs & older 07/27/2022   Moderna Sars-Covid-2 Vaccination 05/04/2019, 06/01/2019   Pfizer Covid-19 Vaccine Bivalent Booster 63yrs & up 01/08/2021   Pfizer(Comirnaty)Fall Seasonal Vaccine 12 years and older 02/20/2022   Pneumococcal Conjugate-13 07/16/2013   Pneumococcal Polysaccharide-23 04/19/2000, 08/16/2017   Td 04/19/2008   Tdap 04/07/2021   Pertinent  Health Maintenance Due  Topic Date Due   INFLUENZA VACCINE  11/18/2022   DEXA SCAN  Discontinued      01/06/2022   12:21 PM 10/20/2022    1:00 PM 02/09/2023    2:03 PM 03/09/2023    3:41 PM 04/19/2023   11:58 AM  Fall Risk  Falls in the past year? 1 1 1 1 1   Was there an injury with Fall? 0 0 0 0 0  Fall Risk Category Calculator 1 2 2 1  1   Fall Risk Category (Retired) Low      Patient at Risk for Falls Due to  History of fall(s);Impaired balance/gait No Fall Risks No Fall Risks   Fall risk Follow up  Falls evaluation completed Falls evaluation completed Falls evaluation completed    Functional Status Survey:    Vitals:   04/25/23 2014  BP: (!) 145/73  Pulse: 65  Resp: 16  Temp: 97.7 F (36.5 C)  SpO2: 98%  Weight: 106 lb (48.1 kg)   Body mass index is 19.39 kg/m. Physical Exam Lungs clear to auscultation bilaterally.  3+ pedal edema bilaterally.  Labs reviewed: Recent Labs    07/14/22 1306 10/28/22 0848 02/07/23 0000  NA 138 135 138  K 4.2 4.4 4.2  CL 101 98 99  CO2 31 32 31*  GLUCOSE 91 87  --   BUN 36* 34* 44*  CREATININE 1.95* 1.92* 2.1*  CALCIUM 9.8 9.9 9.9  PHOS 3.3  --   --  Recent Labs    07/14/22 1306 10/28/22 0848 02/07/23 0000  AST  --  18 20  ALT  --  7 8  ALKPHOS  --   --  70  BILITOT  --  0.7  --   PROT  --  6.8  --   ALBUMIN 4.1  --  4.2   Recent Labs    10/28/22 0848 02/07/23 0000  WBC 4.6 5.5  NEUTROABS 2,875  --   HGB 10.2* 11.2*  HCT 31.0* 34*  MCV 88.1  --   PLT 222 226   Lab Results  Component Value Date   TSH 4.04 03/07/2023   Lab Results  Component Value Date   HGBA1C 6.3 (H) 10/28/2022   Lab Results  Component Value Date   CHOL 391 (H) 06/22/2011   HDL 63.80 06/22/2011   LDLDIRECT 130.6 06/22/2011   TRIG 296.0 (H) 06/22/2011   CHOLHDL 6 06/22/2011    Significant Diagnostic Results in last 30 days:  CUP PACEART REMOTE DEVICE CHECK Result Date: 03/30/2023 Scheduled remote reviewed. Normal device function.  Hx of PAF, on OAC per PA report, longest duration 5hrs , overall controlled rates, AF burden is 1.6% of the time. Next remote 91 days. LA, CVRS   Assessment/Plan Lower extremity edema Patient reports persistent swelling in the ankles. No current use of compression stockings due to discomfort. Family history of similar issue. Possible  contributing factors include prolonged sitting without feet elevation and potential kidney function impairment. -Order labs to assess kidney function. -Encourage patient to elevate feet when sitting. -Consider trial of compression stockings with assistance from nursing staff.  Social Isolation Patient reports difficulty socializing due to hearing impairment and feeling like a burden. Participates in some facility activities. -Encourage continued participation in facility activities. -Consider referral to social work for additional support and resources.  Family/ staff Communication: Nursing  Labs/tests ordered:  BMP

## 2023-04-28 DIAGNOSIS — N184 Chronic kidney disease, stage 4 (severe): Secondary | ICD-10-CM | POA: Diagnosis not present

## 2023-04-28 LAB — BASIC METABOLIC PANEL
BUN: 33 — AB (ref 4–21)
CO2: 31 — AB (ref 13–22)
Chloride: 101 (ref 99–108)
Creatinine: 1.9 — AB (ref 0.5–1.1)
Glucose: 87
Potassium: 4.8 meq/L (ref 3.5–5.1)
Sodium: 138 (ref 137–147)

## 2023-04-28 LAB — COMPREHENSIVE METABOLIC PANEL
Calcium: 9.4 (ref 8.7–10.7)
eGFR: 23

## 2023-05-03 DIAGNOSIS — I480 Paroxysmal atrial fibrillation: Secondary | ICD-10-CM | POA: Diagnosis not present

## 2023-05-03 DIAGNOSIS — Z7189 Other specified counseling: Secondary | ICD-10-CM | POA: Diagnosis not present

## 2023-05-03 DIAGNOSIS — R54 Age-related physical debility: Secondary | ICD-10-CM | POA: Diagnosis not present

## 2023-05-03 DIAGNOSIS — N184 Chronic kidney disease, stage 4 (severe): Secondary | ICD-10-CM | POA: Diagnosis not present

## 2023-05-03 DIAGNOSIS — Z515 Encounter for palliative care: Secondary | ICD-10-CM | POA: Diagnosis not present

## 2023-05-03 DIAGNOSIS — Z7901 Long term (current) use of anticoagulants: Secondary | ICD-10-CM | POA: Diagnosis not present

## 2023-05-03 DIAGNOSIS — R52 Pain, unspecified: Secondary | ICD-10-CM | POA: Diagnosis not present

## 2023-05-03 DIAGNOSIS — I5030 Unspecified diastolic (congestive) heart failure: Secondary | ICD-10-CM | POA: Diagnosis not present

## 2023-05-04 ENCOUNTER — Telehealth: Payer: Self-pay

## 2023-05-04 NOTE — Telephone Encounter (Signed)
 Patient daughter "Amanda [name]" called and request that you have a letter made for her that states that patient is unable to manage her financial affairs. Patient daughter states that you can call her if you'd like to and speak to her about it. Message sent to PCP Valrie Gehrig, MD

## 2023-05-04 NOTE — Telephone Encounter (Signed)
 Letter written, please tell Stevens Eland she can pick up this letter at [facility] on Friday afternoon.   VB

## 2023-05-05 NOTE — Telephone Encounter (Signed)
Patient daughter called back and notified.

## 2023-05-06 NOTE — Progress Notes (Signed)
Remote pacemaker transmission.   

## 2023-05-09 ENCOUNTER — Telehealth: Payer: Self-pay | Admitting: *Deleted

## 2023-05-09 NOTE — Telephone Encounter (Signed)
Amanda [name], daughter called and stated that patient is at [facility], [facility] and patient is having Painful Swollen Ankles and feet and daughter is wanting patient to be evaluated.

## 2023-05-10 NOTE — Telephone Encounter (Signed)
Amanda Mealing, MD  You4 hours ago (12:05 PM)   VB Thank you for letting me know. She has a medication she takes as needed for leg swelling that will hopefully help with this.

## 2023-05-18 DIAGNOSIS — H903 Sensorineural hearing loss, bilateral: Secondary | ICD-10-CM | POA: Diagnosis not present

## 2023-05-18 DIAGNOSIS — H6123 Impacted cerumen, bilateral: Secondary | ICD-10-CM | POA: Diagnosis not present

## 2023-05-24 ENCOUNTER — Encounter: Payer: Self-pay | Admitting: Nurse Practitioner

## 2023-05-24 ENCOUNTER — Non-Acute Institutional Stay (SKILLED_NURSING_FACILITY): Payer: Self-pay | Admitting: Nurse Practitioner

## 2023-05-24 DIAGNOSIS — M150 Primary generalized (osteo)arthritis: Secondary | ICD-10-CM

## 2023-05-24 DIAGNOSIS — R600 Localized edema: Secondary | ICD-10-CM

## 2023-05-24 DIAGNOSIS — F03A3 Unspecified dementia, mild, with mood disturbance: Secondary | ICD-10-CM | POA: Diagnosis not present

## 2023-05-24 DIAGNOSIS — N184 Chronic kidney disease, stage 4 (severe): Secondary | ICD-10-CM

## 2023-05-24 DIAGNOSIS — E039 Hypothyroidism, unspecified: Secondary | ICD-10-CM | POA: Diagnosis not present

## 2023-05-24 DIAGNOSIS — F39 Unspecified mood [affective] disorder: Secondary | ICD-10-CM | POA: Diagnosis not present

## 2023-05-24 DIAGNOSIS — N3090 Cystitis, unspecified without hematuria: Secondary | ICD-10-CM

## 2023-05-24 DIAGNOSIS — I480 Paroxysmal atrial fibrillation: Secondary | ICD-10-CM

## 2023-05-24 DIAGNOSIS — I10 Essential (primary) hypertension: Secondary | ICD-10-CM

## 2023-05-24 NOTE — Progress Notes (Signed)
 Location:  Other [facility].  Nursing Home Room Number: [facility] [address] Place of Service:  SNF [phone]) Harlene An, NP  PCP: Abdul Fine, MD  Patient Care Team: Abdul Fine, MD as PCP - General (Family Medicine) Perla Evalene PARAS, MD as Consulting Physician (Cardiology) Fernande Elspeth BROCKS, MD as Consulting Physician (Cardiology) Milissa Hamming, MD as Referring Physician (Otolaryngology)  Extended Emergency Contact Information Primary Emergency Contact: [name],Amanda Address: [address]          [place], [place] Home Phone: [phone] Mobile Phone: [phone] Relation: Daughter Secondary Emergency Contact: [name],Amanda  [place] Home Phone: [phone] Relation: Daughter  Goals of care: Advanced Directive information    05/24/2023    8:49 AM  Advanced Directives  Does Patient Have a Medical Advance Directive? Yes  Type of Advance Directive Out of facility DNR (pink MOST or yellow form)  Does patient want to make changes to medical advance directive? No - Patient declined     Chief Complaint  Patient presents with   Medical Management of Chronic Issues    Medical Management of Chronic Issues.     HPI:  Pt is a 89 y.o. female seen today for medical management of chronic disease.  She feels like she is doing well. She reports she can not see or hear so does not like to go to the common area. Admits to some depression but does not wish to change medication Enjoys visits from her family She has mild edema to her feet but reports compression hose make legs hurt and feel worse.  Denies having any pain at this time staff has no concerns.    Past Medical History:  Diagnosis Date   Atrial fibrillation (HCC)    Depression    GERD (gastroesophageal reflux disease)    Hyperlipidemia    Hypertension    Hypothyroidism    after Rx for thyroid  cancer   Mitral regurgitation    Osteoarthritis     Osteoporosis    Pacemaker    Scarlet fever    Sleep disturbance    Thyroid  cancer (HCC) 1976   Urinary incontinence    usually from UTI's   Past Surgical History:  Procedure Laterality Date   ABDOMINAL HYSTERECTOMY  1972   BREAST BIOPSY  04/19/2000   BREAST LUMPECTOMY  04/19/1981   CATARACT EXTRACTION, BILATERAL     EP IMPLANTABLE DEVICE N/A 09/02/2015   Procedure: PPM Generator Changeout;  Surgeon: Danelle LELON Birmingham, MD;  Location: [facility];  Service: Cardiovascular;  Laterality: N/A;   Goiter Removed  04/19/1974   cancerous. Then got RAI   PACEMAKER INSERTION  08/17/2005   after syncope   TONSILLECTOMY AND ADENOIDECTOMY  04/20/1935   VESICOVAGINAL FISTULA CLOSURE W/ TAH  04/19/1970    Allergies  Allergen Reactions   Amoxicillin  Diarrhea    Weakness and atrial fib   Augmentin  [Amoxicillin -Pot Clavulanate] Diarrhea    Weakness and atrial fib   Sulfonamide Derivatives     unknown    Outpatient Encounter Medications as of 05/24/2023  Medication Sig   acetaminophen  (TYLENOL ) 500 MG tablet Take 500 mg by mouth every 8 (eight) hours as needed for mild pain.   albuterol (ACCUNEB) 0.63 MG/3ML nebulizer solution Take 1 ampule by nebulization every 6 (six) hours as needed for wheezing.   cholecalciferol (VITAMIN D ) 1000 UNITS tablet Take 1,000 Units by mouth daily.   ELIQUIS  2.5 MG TABS tablet TAKE 1 TABLET BY MOUTH TWICE  DAILY   furosemide (LASIX) 40 MG tablet Take 40 mg by mouth daily.   hydrALAZINE (APRESOLINE) 25 MG tablet Take 12.5 mg by mouth 3 (three) times daily.   Lactobacillus (ACIDOPHILUS PO) Take 1 tablet by mouth daily.   levothyroxine  (SYNTHROID ) 88 MCG tablet TAKE 1 TABLET BY MOUTH DAILY   Melatonin 5 MG CAPS Take 1 capsule by mouth as needed.  Chewable   metoprolol tartrate (LOPRESSOR) 25 MG tablet Take 25 mg by mouth 2 (two) times daily.   mirtazapine  (REMERON ) 30 MG tablet Take 1 tablet (30 mg total) by mouth at bedtime.   nitroGLYCERIN (NITROSTAT) 0.4  MG SL tablet Place 0.4 mg under the tongue every 5 (five) minutes as needed for chest pain (x 3 doses).   oxyCODONE  (ROXICODONE ) 5 MG immediate release tablet Take 1 tablet (5 mg total) by mouth at bedtime.   trimethoprim  (TRIMPEX ) 100 MG tablet TAKE 1 TABLET BY MOUTH DAILY   hydrochlorothiazide  (MICROZIDE ) 12.5 MG capsule Take 12.5 mg by mouth as needed (For swelling). (Patient not taking: Reported on 05/24/2023)   sotalol  (BETAPACE ) 120 MG tablet TAKE ONE-HALF TABLET BY MOUTH  TWICE DAILY (Patient not taking: Reported on 05/24/2023)   No facility-administered encounter medications on file as of 05/24/2023.    Review of Systems  Constitutional:  Negative for activity change, appetite change, fatigue and unexpected weight change.  HENT:  Positive for hearing loss. Negative for congestion.   Eyes:  Positive for visual disturbance.  Respiratory:  Negative for cough and shortness of breath.   Cardiovascular:  Positive for leg swelling. Negative for chest pain and palpitations.  Gastrointestinal:  Negative for abdominal pain, constipation and diarrhea.  Genitourinary:  Negative for difficulty urinating and dysuria.  Musculoskeletal:  Negative for arthralgias and myalgias.  Skin:  Negative for color change and wound.  Neurological:  Negative for dizziness and weakness.  Psychiatric/Behavioral:  Negative for agitation, behavioral problems and confusion.      Immunization History  Administered Date(s) Administered   Fluad Quad(high Dose 65+) 12/26/2018, 02/06/2019, 12/28/2019, 12/31/2020, 01/06/2022   Influenza Split 01/20/2011   Influenza Whole 01/24/2009, 01/21/2010   Influenza, High Dose Seasonal PF 02/14/2018   Influenza,inj,Quad PF,6+ Mos 01/09/2013, 01/15/2014, 02/05/2015, 02/09/2016, 02/02/2017   Influenza-Unspecified 04/25/2023   Moderna Covid-19 Fall Seasonal Vaccine 60yrs & older 07/27/2022   Moderna Sars-Covid-2 Vaccination 05/04/2019, 06/01/2019   Pfizer Covid-19 Vaccine Bivalent  Booster 1yrs & up 01/08/2021   Pfizer(Comirnaty)Fall Seasonal Vaccine 12 years and older 02/20/2022   Pneumococcal Conjugate-13 07/16/2013   Pneumococcal Polysaccharide-23 04/19/2000, 08/16/2017   Td 04/19/2008   Tdap 04/07/2021   Varicella 05/16/2023   Pertinent  Health Maintenance Due  Topic Date Due   INFLUENZA VACCINE  Completed   DEXA SCAN  Discontinued      01/06/2022   12:21 PM 10/20/2022    1:00 PM 02/09/2023    2:03 PM 03/09/2023    3:41 PM 04/19/2023   11:58 AM  Fall Risk  Falls in the past year? 1 1 1 1 1   Was there an injury with Fall? 0 0 0 0 0  Fall Risk Category Calculator 1 2 2 1 1   Fall Risk Category (Retired) Low      Patient at Risk for Falls Due to  History of fall(s);Impaired balance/gait No Fall Risks No Fall Risks   Fall risk Follow up  Falls evaluation completed Falls evaluation completed Falls evaluation completed    Functional Status Survey:    Vitals:   05/24/23  9160 05/24/23 0852  BP: (!) 171/74 (!) 169/73  Pulse: 82   Resp: 18   Temp: 97.8 F (36.6 C)   SpO2: 99%   Weight: 107 lb 6.4 oz (48.7 kg)   Height: 5' 2 (1.575 m)    Body mass index is 19.64 kg/m. Physical Exam Constitutional:      General: She is not in acute distress.    Appearance: She is well-developed. She is not diaphoretic.  HENT:     Head: Normocephalic and atraumatic.     Mouth/Throat:     Pharynx: No oropharyngeal exudate.  Eyes:     Conjunctiva/sclera: Conjunctivae normal.     Pupils: Pupils are equal, round, and reactive to light.  Cardiovascular:     Rate and Rhythm: Normal rate and regular rhythm.     Heart sounds: Normal heart sounds.  Pulmonary:     Effort: Pulmonary effort is normal.     Breath sounds: Normal breath sounds.  Abdominal:     General: Bowel sounds are normal.     Palpations: Abdomen is soft.  Musculoskeletal:     Cervical back: Normal range of motion and neck supple.     Right lower leg: Edema (trace) present.     Left lower leg: Edema  (trace) present.  Skin:    General: Skin is warm and dry.  Neurological:     Mental Status: She is alert.  Psychiatric:        Mood and Affect: Mood normal.     Labs reviewed: Recent Labs    07/14/22 1306 10/28/22 0848 02/07/23 0000 04/28/23 0000  NA 138 135 138 138  K 4.2 4.4 4.2 4.8  CL 101 98 99 101  CO2 31 32 31* 31*  GLUCOSE 91 87  --   --   BUN 36* 34* 44* 33*  CREATININE 1.95* 1.92* 2.1* 1.9*  CALCIUM 9.8 9.9 9.9 9.4  PHOS 3.3  --   --   --    Recent Labs    07/14/22 1306 10/28/22 0848 02/07/23 0000  AST  --  18 20  ALT  --  7 8  ALKPHOS  --   --  70  BILITOT  --  0.7  --   PROT  --  6.8  --   ALBUMIN 4.1  --  4.2   Recent Labs    10/28/22 0848 02/07/23 0000  WBC 4.6 5.5  NEUTROABS 2,875  --   HGB 10.2* 11.2*  HCT 31.0* 34*  MCV 88.1  --   PLT 222 226   Lab Results  Component Value Date   TSH 4.04 03/07/2023   Lab Results  Component Value Date   HGBA1C 6.3 (H) 10/28/2022   Lab Results  Component Value Date   CHOL 391 (H) 06/22/2011   HDL 63.80 06/22/2011   LDLDIRECT 130.6 06/22/2011   TRIG 296.0 (H) 06/22/2011   CHOLHDL 6 06/22/2011    Significant Diagnostic Results in last 30 days:  No results found.  Assessment/Plan Hypothyroidism TSh stable on recent lab continue current does of synthroid    Episodic mood disorder Decrease in mood noted, continues on remeron  30 mg daily, she does not wish to adjust medication at this time, continue supportive care.   Essential hypertension Overall blood pressure well controlled, goal bp <140/90 Continue current medications and dietary modifications as able follow metabolic panel   Osteoarthritis of multiple joints Stable, continues tylenol  PRN and oxycodone  at bedtime- she is unable to get comfortable  to sleep without oxycodone  at night.   Chronic renal disease, stage IV (HCC) Chronic and stable Encourage proper hydration Follow metabolic panel Avoid nephrotoxic meds  (NSAIDS)  Recurrent cystitis Stable, continues trimethoprim  daily   Paroxysmal atrial fibrillation (HCC) Rate controlled, continues lopressor and eliquis  for anticoagulation   Mild dementia with mood disturbance (HCC) Stable, no acute changes in cognitive or functional status, continue supportive care.     Amanda [name] K. Caro BODILY [facility] [phone]

## 2023-05-25 DIAGNOSIS — M2042 Other hammer toe(s) (acquired), left foot: Secondary | ICD-10-CM | POA: Diagnosis not present

## 2023-05-25 DIAGNOSIS — M2041 Other hammer toe(s) (acquired), right foot: Secondary | ICD-10-CM | POA: Diagnosis not present

## 2023-05-25 DIAGNOSIS — I7091 Generalized atherosclerosis: Secondary | ICD-10-CM | POA: Diagnosis not present

## 2023-05-25 DIAGNOSIS — B351 Tinea unguium: Secondary | ICD-10-CM | POA: Diagnosis not present

## 2023-05-25 NOTE — Assessment & Plan Note (Signed)
 Stable, continues trimethoprim  daily

## 2023-05-25 NOTE — Assessment & Plan Note (Signed)
 Stable, continues tylenol  PRN and oxycodone  at bedtime- she is unable to get comfortable to sleep without oxycodone  at night.

## 2023-05-25 NOTE — Assessment & Plan Note (Signed)
 Rate controlled, continues lopressor and eliquis  for anticoagulation

## 2023-05-25 NOTE — Assessment & Plan Note (Signed)
 Chronic and stable Encourage proper hydration Follow metabolic panel Avoid nephrotoxic meds (NSAIDS)

## 2023-05-25 NOTE — Assessment & Plan Note (Signed)
TSh stable on recent lab continue current does of synthroid

## 2023-05-25 NOTE — Assessment & Plan Note (Signed)
 Decrease in mood noted, continues on remeron  30 mg daily, she does not wish to adjust medication at this time, continue supportive care.

## 2023-05-25 NOTE — Assessment & Plan Note (Signed)
 Stable, no acute changes in cognitive or functional status, continue supportive care.

## 2023-05-25 NOTE — Assessment & Plan Note (Signed)
 Overall blood pressure well controlled, goal bp <140/90 Continue current medications and dietary modifications as able follow metabolic panel

## 2023-06-03 ENCOUNTER — Non-Acute Institutional Stay (SKILLED_NURSING_FACILITY): Payer: Self-pay | Admitting: Student

## 2023-06-03 ENCOUNTER — Encounter: Payer: Self-pay | Admitting: Student

## 2023-06-03 DIAGNOSIS — N184 Chronic kidney disease, stage 4 (severe): Secondary | ICD-10-CM | POA: Diagnosis not present

## 2023-06-03 DIAGNOSIS — R296 Repeated falls: Secondary | ICD-10-CM

## 2023-06-03 DIAGNOSIS — Z95811 Presence of heart assist device: Secondary | ICD-10-CM

## 2023-06-03 DIAGNOSIS — D631 Anemia in chronic kidney disease: Secondary | ICD-10-CM | POA: Diagnosis not present

## 2023-06-03 DIAGNOSIS — S0003XA Contusion of scalp, initial encounter: Secondary | ICD-10-CM | POA: Diagnosis not present

## 2023-06-03 NOTE — Progress Notes (Signed)
 Location:  Other [facility].  Nursing Home Room Number: [facility] [address] Place of Service:  SNF [phone]) Provider:  Earnestine Mealing, MD  Patient Care Team: Earnestine Mealing, MD as PCP - General (Family Medicine) Antonieta Iba, MD as Consulting Physician (Cardiology) Duke Salvia, MD as Consulting Physician (Cardiology) Bud Face, MD as Referring Physician (Otolaryngology)  Extended Emergency Contact Information Primary Emergency Contact: Koger,[name] Address: [address]          [place], [place] Home Phone: [phone] Mobile Phone: [phone] Relation: Daughter Secondary Emergency Contact: Sallee [place] Home Phone: [phone] Relation: Daughter  Code Status:  DNR Goals of care: Advanced Directive information    05/24/2023    8:49 AM  Advanced Directives  Does Patient Have a Medical Advance Directive? Yes  Type of Advance Directive Out of facility DNR (pink MOST or yellow form)  Does patient want to make changes to medical advance directive? No - Patient declined     Chief Complaint  Patient presents with   Hypernatremia    Hypernatremia.     HPI:  Pt is a 89 y.o. female seen today for an acute visit for Hypernatremia History of Present Illness The patient is a 89 year old female who presents with a fall resulting in a head hematoma and back pain. She is accompanied by her daughter, who aids with history.  She experienced an unwitnessed fall last night, resulting in a small hematoma on the back of her head. She describes feeling 'sore all over' following the incident. The fall occurred in the bathroom while she was with two nurses, but she is unsure of the exact details of how it happened.  She reports ongoing back pain, which she describes as 'really sore.' She mentions having had a patch applied for pain relief in the past and is currently using oxycodone for pain control.  She also  reports bilateral ankle edema, which she describes as 'getting worse.' She recalls being prescribed a diuretic previously, but notes that the swelling does not reduce much at night. She used to wear compression stockings but no longer does because she cannot manage them.  Past Medical History:  Diagnosis Date   Atrial fibrillation (HCC)    Depression    GERD (gastroesophageal reflux disease)    Hyperlipidemia    Hypertension    Hypothyroidism    after Rx for thyroid cancer   Mitral regurgitation    Osteoarthritis    Osteoporosis    Pacemaker    Scarlet fever    Sleep disturbance    Thyroid cancer (HCC) 1976   Urinary incontinence    usually from UTI's   Past Surgical History:  Procedure Laterality Date   ABDOMINAL HYSTERECTOMY  1972   BREAST BIOPSY  04/19/2000   BREAST LUMPECTOMY  04/19/1981   CATARACT EXTRACTION, BILATERAL     EP IMPLANTABLE DEVICE N/A 09/02/2015   Procedure: PPM Generator Changeout;  Surgeon: Marinus Maw, MD;  Location: [facility];  Service: Cardiovascular;  Laterality: N/A;   Goiter Removed  04/19/1974   cancerous. Then got RAI   PACEMAKER INSERTION  08/17/2005   after syncope   TONSILLECTOMY AND ADENOIDECTOMY  04/20/1935   VESICOVAGINAL FISTULA CLOSURE W/ TAH  04/19/1970    Allergies  Allergen Reactions   Amoxicillin Diarrhea    Weakness and atrial fib   Augmentin [Amoxicillin-Pot Clavulanate] Diarrhea    Weakness and atrial fib   Sulfonamide Derivatives  unknown    Outpatient Encounter Medications as of 06/03/2023  Medication Sig   acetaminophen (TYLENOL) 500 MG tablet Take 500 mg by mouth every 8 (eight) hours as needed for mild pain.   albuterol (ACCUNEB) 0.63 MG/3ML nebulizer solution Take 1 ampule by nebulization every 6 (six) hours as needed for wheezing.   cholecalciferol (VITAMIN D) 1000 UNITS tablet Take 1,000 Units by mouth daily.   ELIQUIS 2.5 MG TABS tablet TAKE 1 TABLET BY MOUTH TWICE  DAILY   furosemide (LASIX) 40 MG  tablet Take 40 mg by mouth daily.   hydrALAZINE (APRESOLINE) 25 MG tablet Take 12.5 mg by mouth 3 (three) times daily.   Lactobacillus (ACIDOPHILUS PO) Take 1 tablet by mouth daily.   levothyroxine (SYNTHROID) 88 MCG tablet TAKE 1 TABLET BY MOUTH DAILY   Melatonin 5 MG CAPS Take 1 capsule by mouth as needed. " Chewable"   metoprolol tartrate (LOPRESSOR) 25 MG tablet Take 25 mg by mouth 2 (two) times daily.   mirtazapine (REMERON) 30 MG tablet Take 1 tablet (30 mg total) by mouth at bedtime.   nitroGLYCERIN (NITROSTAT) 0.4 MG SL tablet Place 0.4 mg under the tongue every 5 (five) minutes as needed for chest pain (x 3 doses).   oxyCODONE (ROXICODONE) 5 MG immediate release tablet Take 1 tablet (5 mg total) by mouth at bedtime.   trimethoprim (TRIMPEX) 100 MG tablet TAKE 1 TABLET BY MOUTH DAILY   No facility-administered encounter medications on file as of 06/03/2023.    Review of Systems  Immunization History  Administered Date(s) Administered   Fluad Quad(high Dose 65+) 12/26/2018, 02/06/2019, 12/28/2019, 12/31/2020, 01/06/2022   Influenza Split 01/20/2011   Influenza Whole 01/24/2009, 01/21/2010   Influenza, High Dose Seasonal PF 02/14/2018   Influenza,inj,Quad PF,6+ Mos 01/09/2013, 01/15/2014, 02/05/2015, 02/09/2016, 02/02/2017   Influenza-Unspecified 04/25/2023   Moderna Covid-19 Fall Seasonal Vaccine 22yrs & older 07/27/2022   Moderna Sars-Covid-2 Vaccination 05/04/2019, 06/01/2019   Pfizer Covid-19 Vaccine Bivalent Booster 7yrs & up 01/08/2021   Pfizer(Comirnaty)Fall Seasonal Vaccine 12 years and older 02/20/2022   Pneumococcal Conjugate-13 07/16/2013   Pneumococcal Polysaccharide-23 04/19/2000, 08/16/2017   Td 04/19/2008   Tdap 04/07/2021   Varicella 05/16/2023   Pertinent  Health Maintenance Due  Topic Date Due   INFLUENZA VACCINE  Completed   DEXA SCAN  Discontinued      01/06/2022   12:21 PM 10/20/2022    1:00 PM 02/09/2023    2:03 PM 03/09/2023    3:41 PM 04/19/2023    11:58 AM  Fall Risk  Falls in the past year? 1 1 1 1 1   Was there an injury with Fall? 0 0 0 0 0  Fall Risk Category Calculator 1 2 2 1 1   Fall Risk Category (Retired) Low      Patient at Risk for Falls Due to  History of fall(s);Impaired balance/gait No Fall Risks No Fall Risks   Fall risk Follow up  Falls evaluation completed Falls evaluation completed Falls evaluation completed    Functional Status Survey:    Vitals:   06/03/23 1553  BP: 126/67  Pulse: 67  Resp: 18  Temp: 99.6 F (37.6 C)  SpO2: 96%  Weight: 107 lb 6.4 oz (48.7 kg)  Height: 5\' 2"  (1.575 m)   Body mass index is 19.64 kg/m. Physical Exam GEN: well-appearing, ambulating to bed HEENT small hematoma at occiput EXTREMITIES: 2+ pitting edema of the bilateral ankles.  Labs reviewed: Recent Labs    07/14/22 1306 10/28/22 0848 02/07/23  0000 04/28/23 0000  NA 138 135 138 138  K 4.2 4.4 4.2 4.8  CL 101 98 99 101  CO2 31 32 31* 31*  GLUCOSE 91 87  --   --   BUN 36* 34* 44* 33*  CREATININE 1.95* 1.92* 2.1* 1.9*  CALCIUM 9.8 9.9 9.9 9.4  PHOS 3.3  --   --   --    Recent Labs    07/14/22 1306 10/28/22 0848 02/07/23 0000  AST  --  18 20  ALT  --  7 8  ALKPHOS  --   --  70  BILITOT  --  0.7  --   PROT  --  6.8  --   ALBUMIN 4.1  --  4.2   Recent Labs    10/28/22 0848 02/07/23 0000  WBC 4.6 5.5  NEUTROABS 2,875  --   HGB 10.2* 11.2*  HCT 31.0* 34*  MCV 88.1  --   PLT 222 226   Lab Results  Component Value Date   TSH 4.04 03/07/2023   Lab Results  Component Value Date   HGBA1C 6.3 (H) 10/28/2022   Lab Results  Component Value Date   CHOL 391 (H) 06/22/2011   HDL 63.80 06/22/2011   LDLDIRECT 130.6 06/22/2011   TRIG 296.0 (H) 06/22/2011   CHOLHDL 6 06/22/2011    Significant Diagnostic Results in last 30 days:  No results found.  Assessment/Plan Fall with Head Trauma Unwitnessed fall resulting in a small hematoma on the back of the head. Reports soreness and back pain. No  loss of consciousness. High risk for complications such as intracranial hemorrhage or fractures. Discussed risks of intracranial hemorrhage (e.g., headache, confusion, nausea, vomiting) and potential need for imaging if symptoms worsen. Informed consent obtained for monitoring and pain management. - Monitor for signs of intracranial hemorrhage (e.g., headache, confusion, nausea, vomiting) - Administer pain medication as needed - Consider imaging if symptoms worsen  Back Pain Significant back pain following the fall, exacerbated by pressure. No neurological deficits or red flags. Discussed pain management options including oxycodone. Informed consent obtained for pain management plan. - Administer oxycodone for pain control - Monitor for changes in pain or new symptoms  Peripheral Edema 2+ pitting edema of bilateral ankles. Non-compliant with compression stockings due to difficulty in application. Discussed alternative management options including diuretics. Informed consent obtained for diuretic therapy and potential side effects such as increased urination. - Administer Lasix 40 mg today - Educate on managing edema - Assist with compression stockings if agreed  General Health Maintenance 89 years old, requires assistance with daily activities. - Regular monitoring of vital signs and overall health status - Provide assistance with daily activities as needed  Follow-up - Schedule follow-up visit based on condition.  Family/ staff Communication: Nursing  Labs/tests ordered:  none

## 2023-06-04 ENCOUNTER — Encounter: Payer: Self-pay | Admitting: Student

## 2023-06-04 DIAGNOSIS — R296 Repeated falls: Secondary | ICD-10-CM | POA: Insufficient documentation

## 2023-06-08 DIAGNOSIS — R2689 Other abnormalities of gait and mobility: Secondary | ICD-10-CM | POA: Diagnosis not present

## 2023-06-08 DIAGNOSIS — Z9181 History of falling: Secondary | ICD-10-CM | POA: Diagnosis not present

## 2023-06-08 DIAGNOSIS — M6281 Muscle weakness (generalized): Secondary | ICD-10-CM | POA: Diagnosis not present

## 2023-06-08 DIAGNOSIS — F03A3 Unspecified dementia, mild, with mood disturbance: Secondary | ICD-10-CM | POA: Diagnosis not present

## 2023-06-09 DIAGNOSIS — R2689 Other abnormalities of gait and mobility: Secondary | ICD-10-CM | POA: Diagnosis not present

## 2023-06-09 DIAGNOSIS — Z9181 History of falling: Secondary | ICD-10-CM | POA: Diagnosis not present

## 2023-06-09 DIAGNOSIS — F03A3 Unspecified dementia, mild, with mood disturbance: Secondary | ICD-10-CM | POA: Diagnosis not present

## 2023-06-09 DIAGNOSIS — M6281 Muscle weakness (generalized): Secondary | ICD-10-CM | POA: Diagnosis not present

## 2023-06-10 ENCOUNTER — Other Ambulatory Visit: Payer: Self-pay | Admitting: Adult Health

## 2023-06-10 ENCOUNTER — Encounter: Payer: Self-pay | Admitting: Adult Health

## 2023-06-10 ENCOUNTER — Non-Acute Institutional Stay (SKILLED_NURSING_FACILITY): Payer: Self-pay | Admitting: Adult Health

## 2023-06-10 DIAGNOSIS — F03B3 Unspecified dementia, moderate, with mood disturbance: Secondary | ICD-10-CM | POA: Diagnosis not present

## 2023-06-10 DIAGNOSIS — F419 Anxiety disorder, unspecified: Secondary | ICD-10-CM

## 2023-06-10 DIAGNOSIS — I10 Essential (primary) hypertension: Secondary | ICD-10-CM

## 2023-06-10 DIAGNOSIS — E039 Hypothyroidism, unspecified: Secondary | ICD-10-CM

## 2023-06-10 MED ORDER — LORAZEPAM 0.5 MG PO TABS: 0.5000 mg | ORAL_TABLET | Freq: Every day | ORAL | 0 refills | Status: DC | PRN

## 2023-06-10 NOTE — Progress Notes (Signed)
 Location:  Other [facility].  Nursing Home Room Number: [facility] 517A Place of Service:  SNF (31) Provider:  Kenard Gower, DNP, FNP-BC  Patient Care Team: Earnestine Mealing, MD as PCP - General (Family Medicine) Antonieta Iba, MD as Consulting Physician (Cardiology) Duke Salvia, MD as Consulting Physician (Cardiology) Bud Face, MD as Referring Physician (Otolaryngology)  Extended Emergency Contact Information Primary Emergency Contact: Koger,[name] Address: [address]          [place], [place] Home Phone: [phone] Mobile Phone: [phone] Relation: Daughter Secondary Emergency Contact: Sallee Provencal [place] Home Phone: [phone] Relation: Daughter  Code Status:  DNR  Goals of care: Advanced Directive information    05/24/2023    8:49 AM  Advanced Directives  Does Patient Have a Medical Advance Directive? Yes  Type of Advance Directive Out of facility DNR (pink MOST or yellow form)  Does patient want to make changes to medical advance directive? No - Patient declined     Chief Complaint  Patient presents with   Anxiety    Anxiety    HPI:  Pt is a 89 y.o. female seen today for anxiety. She is a resident of [facility]. She was reported to have increased anxiety. daughter is requesting medication to help with patient's anxiety.  Daughter was visiting at bedside and verbalized the patient gets restless and obsessed about things.  Essential hypertension  BP 126/67, takes hydralazine 12.5 mg 3 times a day and Metoprolol 25 mg BID  Hypothyroidism, unspecified type  -takes levothyroxine 88 mcg at bedtime  Dementia - BIMS score 10/15, ranging as moderate cognitive impairment   Past Medical History:  Diagnosis Date   Atrial fibrillation (HCC)    Depression    GERD (gastroesophageal reflux disease)    Hyperlipidemia    Hypertension    Hypothyroidism    after Rx for  thyroid cancer   Mitral regurgitation    Osteoarthritis    Osteoporosis    Pacemaker    Scarlet fever    Sleep disturbance    Thyroid cancer (HCC) 1976   Urinary incontinence    usually from UTI's   Past Surgical History:  Procedure Laterality Date   ABDOMINAL HYSTERECTOMY  1972   BREAST BIOPSY  04/19/2000   BREAST LUMPECTOMY  04/19/1981   CATARACT EXTRACTION, BILATERAL     EP IMPLANTABLE DEVICE N/A 09/02/2015   Procedure: PPM Generator Changeout;  Surgeon: Marinus Maw, MD;  Location: [facility] INVASIVE CV LAB;  Service: Cardiovascular;  Laterality: N/A;   Goiter Removed  04/19/1974   cancerous. Then got RAI   PACEMAKER INSERTION  08/17/2005   after syncope   TONSILLECTOMY AND ADENOIDECTOMY  04/20/1935   VESICOVAGINAL FISTULA CLOSURE W/ TAH  04/19/1970    Allergies  Allergen Reactions   Amoxicillin Diarrhea    Weakness and atrial fib   Augmentin [Amoxicillin-Pot Clavulanate] Diarrhea    Weakness and atrial fib   Sulfonamide Derivatives     unknown    Outpatient Encounter Medications as of 06/10/2023  Medication Sig   acetaminophen (TYLENOL) 500 MG tablet Take 500 mg by mouth every 8 (eight) hours as needed for mild pain.   albuterol (ACCUNEB) 0.63 MG/3ML nebulizer solution Take 1 ampule by nebulization every 6 (six) hours as needed for wheezing.   calcium carbonate (TUMS - DOSED IN MG ELEMENTAL CALCIUM) 500 MG chewable tablet Chew 2 tablets by mouth daily.   cholecalciferol (VITAMIN D) 1000  UNITS tablet Take 1,000 Units by mouth daily.   ELIQUIS 2.5 MG TABS tablet TAKE 1 TABLET BY MOUTH TWICE  DAILY   furosemide (LASIX) 40 MG tablet Take 40 mg by mouth daily.   hydrALAZINE (APRESOLINE) 25 MG tablet Take 12.5 mg by mouth 3 (three) times daily.   Lactobacillus (ACIDOPHILUS PO) Take 1 tablet by mouth daily.   levothyroxine (SYNTHROID) 88 MCG tablet TAKE 1 TABLET BY MOUTH DAILY   LORazepam (ATIVAN) 0.5 MG tablet Take 1 tablet (0.5 mg total) by mouth daily as needed for up to 14  days for anxiety.   Melatonin 5 MG CAPS Take 1 capsule by mouth as needed. " Chewable"   metoprolol tartrate (LOPRESSOR) 25 MG tablet Take 25 mg by mouth 2 (two) times daily.   mirtazapine (REMERON) 30 MG tablet Take 1 tablet (30 mg total) by mouth at bedtime.   nitroGLYCERIN (NITROSTAT) 0.4 MG SL tablet Place 0.4 mg under the tongue every 5 (five) minutes as needed for chest pain (x 3 doses).   oxyCODONE (ROXICODONE) 5 MG immediate release tablet Take 1 tablet (5 mg total) by mouth at bedtime.   trimethoprim (TRIMPEX) 100 MG tablet TAKE 1 TABLET BY MOUTH DAILY   No facility-administered encounter medications on file as of 06/10/2023.    Review of Systems  Constitutional:  Negative for appetite change, chills, fatigue and fever.  HENT:  Positive for hearing loss. Negative for congestion, rhinorrhea and sore throat.        Deaf on right ear  Eyes: Negative.   Respiratory:  Negative for cough, shortness of breath and wheezing.   Cardiovascular:  Negative for chest pain, palpitations and leg swelling.  Gastrointestinal:  Negative for abdominal pain, constipation, diarrhea, nausea and vomiting.  Genitourinary:  Negative for dysuria.  Musculoskeletal:  Negative for arthralgias, back pain and myalgias.  Skin:  Positive for color change. Negative for rash and wound.  Neurological:  Negative for dizziness, weakness and headaches.  Psychiatric/Behavioral:  Negative for behavioral problems. The patient is not nervous/anxious.        Immunization History  Administered Date(s) Administered   Fluad Quad(high Dose 65+) 12/26/2018, 02/06/2019, 12/28/2019, 12/31/2020, 01/06/2022   Influenza Split 01/20/2011   Influenza Whole 01/24/2009, 01/21/2010   Influenza, High Dose Seasonal PF 02/14/2018   Influenza,inj,Quad PF,6+ Mos 01/09/2013, 01/15/2014, 02/05/2015, 02/09/2016, 02/02/2017   Influenza-Unspecified 04/25/2023   Moderna Covid-19 Fall Seasonal Vaccine 13yrs & older 07/27/2022   Moderna  Sars-Covid-2 Vaccination 05/04/2019, 06/01/2019   Pfizer Covid-19 Vaccine Bivalent Booster 58yrs & up 01/08/2021   Pfizer(Comirnaty)Fall Seasonal Vaccine 12 years and older 02/20/2022   Pneumococcal Conjugate-13 07/16/2013   Pneumococcal Polysaccharide-23 04/19/2000, 08/16/2017   Td 04/19/2008   Tdap 04/07/2021   Varicella 05/16/2023   Pertinent  Health Maintenance Due  Topic Date Due   INFLUENZA VACCINE  Completed   DEXA SCAN  Discontinued      01/06/2022   12:21 PM 10/20/2022    1:00 PM 02/09/2023    2:03 PM 03/09/2023    3:41 PM 04/19/2023   11:58 AM  Fall Risk  Falls in the past year? 1 1 1 1 1   Was there an injury with Fall? 0 0 0 0 0  Fall Risk Category Calculator 1 2 2 1 1   Fall Risk Category (Retired) Low      Patient at Risk for Falls Due to  History of fall(s);Impaired balance/gait No Fall Risks No Fall Risks   Fall risk Follow up  Falls evaluation  completed Falls evaluation completed Falls evaluation completed      Vitals:   06/10/23 1119  BP: 126/67  Pulse: 67  Resp: 18  Temp: 99.6 F (37.6 C)  SpO2: 96%  Weight: 107 lb 6.4 oz (48.7 kg)  Height: 5\' 2"  (1.575 m)   Body mass index is 19.64 kg/m.  Physical Exam Constitutional:      Appearance: Normal appearance.  HENT:     Head: Normocephalic and atraumatic.     Ears:     Comments: Wears hearing aid on left ear    Nose: Nose normal.     Mouth/Throat:     Mouth: Mucous membranes are moist.  Eyes:     Conjunctiva/sclera: Conjunctivae normal.  Cardiovascular:     Rate and Rhythm: Normal rate and regular rhythm.  Pulmonary:     Effort: Pulmonary effort is normal.     Breath sounds: Normal breath sounds.  Abdominal:     General: Bowel sounds are normal.     Palpations: Abdomen is soft.  Musculoskeletal:        General: Normal range of motion.     Cervical back: Normal range of motion.  Skin:    General: Skin is warm and dry.     Comments: Small bruise on forehead  Neurological:     Mental  Status: She is alert.  Psychiatric:        Mood and Affect: Mood normal.        Behavior: Behavior normal.      Labs reviewed: Recent Labs    07/14/22 1306 10/28/22 0848 02/07/23 0000 04/28/23 0000  NA 138 135 138 138  K 4.2 4.4 4.2 4.8  CL 101 98 99 101  CO2 31 32 31* 31*  GLUCOSE 91 87  --   --   BUN 36* 34* 44* 33*  CREATININE 1.95* 1.92* 2.1* 1.9*  CALCIUM 9.8 9.9 9.9 9.4  PHOS 3.3  --   --   --    Recent Labs    07/14/22 1306 10/28/22 0848 02/07/23 0000  AST  --  18 20  ALT  --  7 8  ALKPHOS  --   --  70  BILITOT  --  0.7  --   PROT  --  6.8  --   ALBUMIN 4.1  --  4.2   Recent Labs    10/28/22 0848 02/07/23 0000  WBC 4.6 5.5  NEUTROABS 2,875  --   HGB 10.2* 11.2*  HCT 31.0* 34*  MCV 88.1  --   PLT 222 226   Lab Results  Component Value Date   TSH 4.04 03/07/2023   Lab Results  Component Value Date   HGBA1C 6.3 (H) 10/28/2022   Lab Results  Component Value Date   CHOL 391 (H) 06/22/2011   HDL 63.80 06/22/2011   LDLDIRECT 130.6 06/22/2011   TRIG 296.0 (H) 06/22/2011   CHOLHDL 6 06/22/2011    Significant Diagnostic Results in last 30 days:  No results found.  Assessment/Plan  1. Anxiety (Primary) -  has anxiety attacks -  will start on low dose Ativan PRN - LORazepam (ATIVAN) 0.5 MG tablet; Take 0.5 tablets (0.25 mg total) by mouth daily as needed for up to 14 days for anxiety.  Dispense: 7 tablet; Refill: 0  2. Essential hypertension -  BP stable -  continue hydralazine 12.5 mg 3 times a day and Metoprolol 25 mg BID  3. Hypothyroidism, unspecified type Lab Results  Component Value  Date   TSH 4.04 03/07/2023    -  continue Levothyroxine 88 mcg at bedtime  4. Moderate dementia with mood disturbance, unspecified dementia type (HCC) -  BIscoreMS 10/15, ranging as moderate cognitive impairment -   Continue supportive care -   Fall precautions    Family/ staff Communication: Discussed plan of care with resident, daughter and  charge nurse.  Labs/tests ordered: None    Kenard Gower, DNP, MSN, FNP-BC [facility] [phone] (Monday-Friday 8:00 a.m. - 5:00 p.m.) [phone] (after hours)

## 2023-06-12 MED ORDER — LORAZEPAM 0.5 MG PO TABS
0.2500 mg | ORAL_TABLET | Freq: Every day | ORAL | 0 refills | Status: DC | PRN
Start: 2023-06-12 — End: 2023-06-26

## 2023-06-15 ENCOUNTER — Encounter: Payer: Self-pay | Admitting: Student

## 2023-06-15 ENCOUNTER — Non-Acute Institutional Stay (SKILLED_NURSING_FACILITY): Payer: Self-pay | Admitting: Student

## 2023-06-15 DIAGNOSIS — R296 Repeated falls: Secondary | ICD-10-CM | POA: Diagnosis not present

## 2023-06-15 DIAGNOSIS — Z515 Encounter for palliative care: Secondary | ICD-10-CM

## 2023-06-15 DIAGNOSIS — S81811A Laceration without foreign body, right lower leg, initial encounter: Secondary | ICD-10-CM

## 2023-06-15 NOTE — Progress Notes (Unsigned)
 Location:  Other [facility].  Nursing Home Room Number: [facility] 517A Place of Service:  SNF [phone]) Provider:  Earnestine Mealing, MD  Patient Care Team: Earnestine Mealing, MD as PCP - General (Family Medicine) Antonieta Iba, MD as Consulting Physician (Cardiology) Duke Salvia, MD as Consulting Physician (Cardiology) Bud Face, MD as Referring Physician (Otolaryngology)  Extended Emergency Contact Information Primary Emergency Contact: Koger,[name] Address: [address]          [place], [place] Home Phone: [phone] Mobile Phone: [phone] Relation: Daughter Secondary Emergency Contact: Sallee [place]cal [place] Home Phone: [phone] Relation: Daughter  Code Status:  DNR Goals of care: Advanced Directive information    05/24/2023    8:49 AM  Advanced Directives  Does Patient Have a Medical Advance Directive? Yes  Type of Advance Directive Out of facility DNR (pink MOST or yellow form)  Does patient want to make changes to medical advance directive? No - Patient declined     Chief Complaint  Patient presents with   Fall    Fall with Skin Tear.     HPI:  Pt is a 89 y.o. female seen today for an acute visit for Fall with Skin Tear.  Patient had a fall overnight. She states she feel with her walker and the rolling walker hit her leg. Nursing overnight encouraged  patient to go to the emergency room, but she refused. Wound covered with vaseline gauze and kerlix.   Past Medical History:  Diagnosis Date   Atrial fibrillation (HCC)    Depression    GERD (gastroesophageal reflux disease)    Hyperlipidemia    Hypertension    Hypothyroidism    after Rx for thyroid cancer   Mitral regurgitation    Osteoarthritis    Osteoporosis    Pacemaker    Scarlet fever    Sleep disturbance    Thyroid cancer (HCC) 1976   Urinary incontinence    usually from UTI's   Past Surgical History:  Procedure  Laterality Date   ABDOMINAL HYSTERECTOMY  1972   BREAST BIOPSY  04/19/2000   BREAST LUMPECTOMY  04/19/1981   CATARACT EXTRACTION, BILATERAL     EP IMPLANTABLE DEVICE N/A 09/02/2015   Procedure: PPM Generator Changeout;  Surgeon: Marinus Maw, MD;  Location: [facility] INVASIVE CV LAB;  Service: Cardiovascular;  Laterality: N/A;   Goiter Removed  04/19/1974   cancerous. Then got RAI   PACEMAKER INSERTION  08/17/2005   after syncope   TONSILLECTOMY AND ADENOIDECTOMY  04/20/1935   VESICOVAGINAL FISTULA CLOSURE W/ TAH  04/19/1970    Allergies  Allergen Reactions   Amoxicillin Diarrhea    Weakness and atrial fib   Augmentin [Amoxicillin-Pot Clavulanate] Diarrhea    Weakness and atrial fib   Sulfonamide Derivatives     unknown    Outpatient Encounter Medications as of 06/15/2023  Medication Sig   acetaminophen (TYLENOL) 500 MG tablet Take 500 mg by mouth every 8 (eight) hours as needed for mild pain.   albuterol (ACCUNEB) 0.63 MG/3ML nebulizer solution Take 1 ampule by nebulization every 6 (six) hours as needed for wheezing.   calcium carbonate (TUMS - DOSED IN MG ELEMENTAL CALCIUM) 500 MG chewable tablet Chew 2 tablets by mouth daily.   cholecalciferol (VITAMIN D) 1000 UNITS tablet Take 1,000 Units by mouth daily.   ELIQUIS 2.5 MG TABS tablet TAKE 1 TABLET BY MOUTH TWICE  DAILY   furosemide (LASIX) 40 MG tablet Take 40 mg  by mouth daily.   hydrALAZINE (APRESOLINE) 25 MG tablet Take 12.5 mg by mouth 3 (three) times daily.   Lactobacillus (ACIDOPHILUS PO) Take 1 tablet by mouth daily.   levothyroxine (SYNTHROID) 88 MCG tablet TAKE 1 TABLET BY MOUTH DAILY   LORazepam (ATIVAN) 0.5 MG tablet Take 0.5 tablets (0.25 mg total) by mouth daily as needed for up to 14 days for anxiety.   Melatonin 5 MG CAPS Take 1 capsule by mouth as needed. " Chewable"   metoprolol tartrate (LOPRESSOR) 25 MG tablet Take 25 mg by mouth 2 (two) times daily.   mirtazapine (REMERON) 30 MG tablet Take 1 tablet (30 mg  total) by mouth at bedtime.   nitroGLYCERIN (NITROSTAT) 0.4 MG SL tablet Place 0.4 mg under the tongue every 5 (five) minutes as needed for chest pain (x 3 doses).   oxyCODONE (ROXICODONE) 5 MG immediate release tablet Take 1 tablet (5 mg total) by mouth at bedtime.   trimethoprim (TRIMPEX) 100 MG tablet TAKE 1 TABLET BY MOUTH DAILY   No facility-administered encounter medications on file as of 06/15/2023.    Review of Systems  Immunization History  Administered Date(s) Administered   Fluad Quad(high Dose 65+) 12/26/2018, 02/06/2019, 12/28/2019, 12/31/2020, 01/06/2022   Influenza Split 01/20/2011   Influenza Whole 01/24/2009, 01/21/2010   Influenza, High Dose Seasonal PF 02/14/2018   Influenza,inj,Quad PF,6+ Mos 01/09/2013, 01/15/2014, 02/05/2015, 02/09/2016, 02/02/2017   Influenza-Unspecified 04/25/2023   Moderna Covid-19 Fall Seasonal Vaccine 59yrs & older 07/27/2022   Moderna Sars-Covid-2 Vaccination 05/04/2019, 06/01/2019   Pfizer Covid-19 Vaccine Bivalent Booster 30yrs & up 01/08/2021   Pfizer(Comirnaty)Fall Seasonal Vaccine 12 years and older 02/20/2022   Pneumococcal Conjugate-13 07/16/2013   Pneumococcal Polysaccharide-23 04/19/2000, 08/16/2017   Td 04/19/2008   Tdap 04/07/2021   Varicella 05/16/2023   Pertinent  Health Maintenance Due  Topic Date Due   INFLUENZA VACCINE  Completed   DEXA SCAN  Discontinued      10/20/2022    1:00 PM 02/09/2023    2:03 PM 03/09/2023    3:41 PM 04/19/2023   11:58 AM 06/15/2023    9:42 AM  Fall Risk  Falls in the past year? 1 1 1 1 1   Was there an injury with Fall? 0 0 0 0 1  Fall Risk Category Calculator 2 2 1 1 2   Patient at Risk for Falls Due to History of fall(s);Impaired balance/gait No Fall Risks No Fall Risks  Impaired balance/gait  Fall risk Follow up Falls evaluation completed Falls evaluation completed Falls evaluation completed  Falls evaluation completed   Functional Status Survey:    Vitals:   06/15/23 0937  BP:  126/67  Pulse: 67  Resp: 18  Temp: 99.6 F (37.6 C)  SpO2: 96%  Weight: 107 lb 6.4 oz (48.7 kg)  Height: 5\' 2"  (1.575 m)   Body mass index is 19.64 kg/m. Physical Exam Constitutional:      Appearance: Normal appearance.  Musculoskeletal:     Comments: Bilateral lower extremity swelling  Skin:    Comments: Wound bandage soaked with old dry blood and fresh blood.   15x4cm laceration with profuse bleeding.   Neurological:     Mental Status: She is alert.     Labs reviewed: Recent Labs    07/14/22 1306 10/28/22 0848 02/07/23 0000 04/28/23 0000  NA 138 135 138 138  K 4.2 4.4 4.2 4.8  CL 101 98 99 101  CO2 31 32 31* 31*  GLUCOSE 91 87  --   --  BUN 36* 34* 44* 33*  CREATININE 1.95* 1.92* 2.1* 1.9*  CALCIUM 9.8 9.9 9.9 9.4  PHOS 3.3  --   --   --    Recent Labs    07/14/22 1306 10/28/22 0848 02/07/23 0000  AST  --  18 20  ALT  --  7 8  ALKPHOS  --   --  70  BILITOT  --  0.7  --   PROT  --  6.8  --   ALBUMIN 4.1  --  4.2   Recent Labs    10/28/22 0848 02/07/23 0000  WBC 4.6 5.5  NEUTROABS 2,875  --   HGB 10.2* 11.2*  HCT 31.0* 34*  MCV 88.1  --   PLT 222 226   Lab Results  Component Value Date   TSH 4.04 03/07/2023   Lab Results  Component Value Date   HGBA1C 6.3 (H) 10/28/2022   Lab Results  Component Value Date   CHOL 391 (H) 06/22/2011   HDL 63.80 06/22/2011   LDLDIRECT 130.6 06/22/2011   TRIG 296.0 (H) 06/22/2011   CHOLHDL 6 06/22/2011    Significant Diagnostic Results in last 30 days:  No results found.  Assessment/Plan Laceration of right lower extremity, initial encounter  Recurrent falls Verbal consent for wound care and management received from the patient. Wound cleaned with wound cleanser. Skin is not well-approximated for the full length of the wound. Able to re-approximate the upper 3 cm of the wound with steri strips. 6mL of 1% Lidocaine with epinephrine injected to the wound to aid in pain control and bleeding. Bleeding  persists. Given the age of the wound, unable to suture, applied 3 steri strips at the apex which can re-approximate. Applied pressure with a moist gauze, and area never reached hemostasis. Applied small amount of 20% Aluminum chloride to achieve hemostasis. Vaseline gauze applied and wound wrapped in kerlix. Will continue daily wound dressing changes. Will evaluate on 2/28 to determine further need for interventions.   Will have physical therapy evaluate patient for concern of unsafe use of walker. Encourage use of call button to prevent falls.   Family/ staff Communication: nursing  Labs/tests ordered:  none

## 2023-06-16 MED ORDER — OXYCODONE HCL 5 MG PO TABS: 5.0000 mg | ORAL_TABLET | Freq: Four times a day (QID) | ORAL | 0 refills | Status: DC | PRN

## 2023-06-16 MED ORDER — LORAZEPAM 0.5 MG PO TABS: 0.5000 mg | ORAL_TABLET | Freq: Three times a day (TID) | ORAL | 0 refills | Status: DC | PRN

## 2023-06-17 ENCOUNTER — Non-Acute Institutional Stay (SKILLED_NURSING_FACILITY): Payer: Self-pay | Admitting: Student

## 2023-06-17 ENCOUNTER — Encounter: Payer: Self-pay | Admitting: Student

## 2023-06-17 DIAGNOSIS — Z5189 Encounter for other specified aftercare: Secondary | ICD-10-CM | POA: Diagnosis not present

## 2023-06-17 NOTE — Progress Notes (Signed)
 Location:  Other [facility].  Nursing Home Room Number: [facility] [address]A Place of Service:  SNF [phone]) Provider:  Earnestine Mealing, MD  Patient Care Team: Earnestine Mealing, MD as PCP - General (Family Medicine) Antonieta Iba, MD as Consulting Physician (Cardiology) Duke Salvia, MD as Consulting Physician (Cardiology) Bud Face, MD as Referring Physician (Otolaryngology)  Extended Emergency Contact Information Primary Emergency Contact: Koger,[name] Address: [address]          [place], [place] Home Phone: [phone] Mobile Phone: [phone] Relation: Daughter Secondary Emergency Contact: Sallee Provencal [place] Home Phone: [phone] Relation: Daughter  Code Status:  DNR Goals of care: Advanced Directive information    05/24/2023    8:49 AM  Advanced Directives  Does Patient Have a Medical Advance Directive? Yes  Type of Advance Directive Out of facility DNR (pink MOST or yellow form)  Does patient want to make changes to medical advance directive? No - Patient declined     Chief Complaint  Patient presents with   Wound Check    Wound Check    HPI:  Pt is a 89 y.o. female seen today for an acute visit for Wound Check. She is doing okay. Feels thirsty as she drinks water and orange juice.   Past Medical History:  Diagnosis Date   Atrial fibrillation (HCC)    Depression    GERD (gastroesophageal reflux disease)    Hyperlipidemia    Hypertension    Hypothyroidism    after Rx for thyroid cancer   Mitral regurgitation    Osteoarthritis    Osteoporosis    Pacemaker    Scarlet fever    Sleep disturbance    Thyroid cancer (HCC) 1976   Urinary incontinence    usually from UTI's   Past Surgical History:  Procedure Laterality Date   ABDOMINAL HYSTERECTOMY  1972   BREAST BIOPSY  04/19/2000   BREAST LUMPECTOMY  04/19/1981   CATARACT EXTRACTION, BILATERAL     EP IMPLANTABLE DEVICE N/A  09/02/2015   Procedure: PPM Generator Changeout;  Surgeon: Marinus Maw, MD;  Location: [facility];  Service: Cardiovascular;  Laterality: N/A;   Goiter Removed  04/19/1974   cancerous. Then got RAI   PACEMAKER INSERTION  08/17/2005   after syncope   TONSILLECTOMY AND ADENOIDECTOMY  04/20/1935   VESICOVAGINAL FISTULA CLOSURE W/ TAH  04/19/1970    Allergies  Allergen Reactions   Amoxicillin Diarrhea    Weakness and atrial fib   Augmentin [Amoxicillin-Pot Clavulanate] Diarrhea    Weakness and atrial fib   Sulfonamide Derivatives     unknown    Outpatient Encounter Medications as of 06/17/2023  Medication Sig   acetaminophen (TYLENOL) 500 MG tablet Take 500 mg by mouth every 8 (eight) hours as needed for mild pain.   albuterol (ACCUNEB) 0.63 MG/3ML nebulizer solution Take 1 ampule by nebulization every 6 (six) hours as needed for wheezing.   calcium carbonate (TUMS - DOSED IN MG ELEMENTAL CALCIUM) 500 MG chewable tablet Chew 2 tablets by mouth daily.   cholecalciferol (VITAMIN D) 1000 UNITS tablet Take 1,000 Units by mouth daily.   ELIQUIS 2.5 MG TABS tablet TAKE 1 TABLET BY MOUTH TWICE  DAILY   furosemide (LASIX) 40 MG tablet Take 40 mg by mouth daily.   hydrALAZINE (APRESOLINE) 25 MG tablet Take 12.5 mg by mouth 3 (three) times daily.   Lactobacillus (ACIDOPHILUS PO) Take 1 tablet by mouth daily.  levothyroxine (SYNTHROID) 88 MCG tablet TAKE 1 TABLET BY MOUTH DAILY   LORazepam (ATIVAN) 0.5 MG tablet Take 1 tablet (0.5 mg total) by mouth every 8 (eight) hours as needed for anxiety (AGITATION).   Melatonin 5 MG CAPS Take 1 capsule by mouth as needed. " Chewable"   metoprolol tartrate (LOPRESSOR) 25 MG tablet Take 25 mg by mouth 2 (two) times daily.   mirtazapine (REMERON) 30 MG tablet Take 1 tablet (30 mg total) by mouth at bedtime.   nitroGLYCERIN (NITROSTAT) 0.4 MG SL tablet Place 0.4 mg under the tongue every 5 (five) minutes as needed for chest pain (x 3 doses).    oxyCODONE (ROXICODONE) 5 MG immediate release tablet Take 1 tablet (5 mg total) by mouth at bedtime.   oxyCODONE (ROXICODONE) 5 MG immediate release tablet Take 1 tablet (5 mg total) by mouth every 6 (six) hours as needed for severe pain (pain score 7-10).   polyethylene glycol (MIRALAX / GLYCOLAX) 17 g packet Take 17 g by mouth 2 (two) times daily as needed.   trimethoprim (TRIMPEX) 100 MG tablet TAKE 1 TABLET BY MOUTH DAILY   No facility-administered encounter medications on file as of 06/17/2023.    Review of Systems  Immunization History  Administered Date(s) Administered   Fluad Quad(high Dose 65+) 12/26/2018, 02/06/2019, 12/28/2019, 12/31/2020, 01/06/2022   Influenza Split 01/20/2011   Influenza Whole 01/24/2009, 01/21/2010   Influenza, High Dose Seasonal PF 02/14/2018   Influenza,inj,Quad PF,6+ Mos 01/09/2013, 01/15/2014, 02/05/2015, 02/09/2016, 02/02/2017   Influenza-Unspecified 04/25/2023   Moderna Covid-19 Fall Seasonal Vaccine 18yrs & older 07/27/2022   Moderna Sars-Covid-2 Vaccination 05/04/2019, 06/01/2019   Pfizer Covid-19 Vaccine Bivalent Booster 17yrs & up 01/08/2021   Pfizer(Comirnaty)Fall Seasonal Vaccine 12 years and older 02/20/2022   Pneumococcal Conjugate-13 07/16/2013   Pneumococcal Polysaccharide-23 04/19/2000, 08/16/2017   Td 04/19/2008   Tdap 04/07/2021   Varicella 05/16/2023   Pertinent  Health Maintenance Due  Topic Date Due   INFLUENZA VACCINE  Completed   DEXA SCAN  Discontinued      10/20/2022    1:00 PM 02/09/2023    2:03 PM 03/09/2023    3:41 PM 04/19/2023   11:58 AM 06/15/2023    9:42 AM  Fall Risk  Falls in the past year? 1 1 1 1 1   Was there an injury with Fall? 0 0 0 0 1  Fall Risk Category Calculator 2 2 1 1 2   Patient at Risk for Falls Due to History of fall(s);Impaired balance/gait No Fall Risks No Fall Risks  Impaired balance/gait  Fall risk Follow up Falls evaluation completed Falls evaluation completed Falls evaluation completed   Falls evaluation completed   Functional Status Survey:    Vitals:   06/17/23 1002  BP: 110/63  Pulse: 60  Resp: 19  Temp: 99.6 F (37.6 C)  SpO2: 96%  Weight: 107 lb 6.4 oz (48.7 kg)  Height: 5\' 2"  (1.575 m)   Body mass index is 19.64 kg/m. Physical Exam Skin:    Comments: Right leg laceration 50% decreased in size, hemostatic      Labs reviewed: Recent Labs    07/14/22 1306 10/28/22 0848 02/07/23 0000 04/28/23 0000  NA 138 135 138 138  K 4.2 4.4 4.2 4.8  CL 101 98 99 101  CO2 31 32 31* 31*  GLUCOSE 91 87  --   --   BUN 36* 34* 44* 33*  CREATININE 1.95* 1.92* 2.1* 1.9*  CALCIUM 9.8 9.9 9.9 9.4  PHOS 3.3  --   --   --  Recent Labs    07/14/22 1306 10/28/22 0848 02/07/23 0000  AST  --  18 20  ALT  --  7 8  ALKPHOS  --   --  70  BILITOT  --  0.7  --   PROT  --  6.8  --   ALBUMIN 4.1  --  4.2   Recent Labs    10/28/22 0848 02/07/23 0000  WBC 4.6 5.5  NEUTROABS 2,875  --   HGB 10.2* 11.2*  HCT 31.0* 34*  MCV 88.1  --   PLT 222 226   Lab Results  Component Value Date   TSH 4.04 03/07/2023   Lab Results  Component Value Date   HGBA1C 6.3 (H) 10/28/2022   Lab Results  Component Value Date   CHOL 391 (H) 06/22/2011   HDL 63.80 06/22/2011   LDLDIRECT 130.6 06/22/2011   TRIG 296.0 (H) 06/22/2011   CHOLHDL 6 06/22/2011    Significant Diagnostic Results in last 30 days:  No results found.  Assessment/Plan Visit for wound check Wound healing well. Continue current wound care with daily cleansing, vasaline impregnated gauze and wrap with kerlix and coban.   Family/ staff Communication: nursing  Labs/tests ordered:  none

## 2023-06-18 DIAGNOSIS — M6281 Muscle weakness (generalized): Secondary | ICD-10-CM | POA: Diagnosis not present

## 2023-06-18 DIAGNOSIS — R2689 Other abnormalities of gait and mobility: Secondary | ICD-10-CM | POA: Diagnosis not present

## 2023-06-18 DIAGNOSIS — Z9181 History of falling: Secondary | ICD-10-CM | POA: Diagnosis not present

## 2023-06-19 DIAGNOSIS — M6281 Muscle weakness (generalized): Secondary | ICD-10-CM | POA: Diagnosis not present

## 2023-06-19 DIAGNOSIS — Z9181 History of falling: Secondary | ICD-10-CM | POA: Diagnosis not present

## 2023-06-19 DIAGNOSIS — R2689 Other abnormalities of gait and mobility: Secondary | ICD-10-CM | POA: Diagnosis not present

## 2023-06-20 DIAGNOSIS — R2689 Other abnormalities of gait and mobility: Secondary | ICD-10-CM | POA: Diagnosis not present

## 2023-06-20 DIAGNOSIS — M6281 Muscle weakness (generalized): Secondary | ICD-10-CM | POA: Diagnosis not present

## 2023-06-20 DIAGNOSIS — Z9181 History of falling: Secondary | ICD-10-CM | POA: Diagnosis not present

## 2023-06-21 DIAGNOSIS — R2689 Other abnormalities of gait and mobility: Secondary | ICD-10-CM | POA: Diagnosis not present

## 2023-06-21 DIAGNOSIS — M6281 Muscle weakness (generalized): Secondary | ICD-10-CM | POA: Diagnosis not present

## 2023-06-21 DIAGNOSIS — Z9181 History of falling: Secondary | ICD-10-CM | POA: Diagnosis not present

## 2023-06-22 ENCOUNTER — Encounter: Payer: Self-pay | Admitting: Student

## 2023-06-22 ENCOUNTER — Non-Acute Institutional Stay (SKILLED_NURSING_FACILITY): Payer: Self-pay | Admitting: Student

## 2023-06-22 DIAGNOSIS — M6281 Muscle weakness (generalized): Secondary | ICD-10-CM | POA: Diagnosis not present

## 2023-06-22 DIAGNOSIS — N184 Chronic kidney disease, stage 4 (severe): Secondary | ICD-10-CM | POA: Diagnosis not present

## 2023-06-22 DIAGNOSIS — D631 Anemia in chronic kidney disease: Secondary | ICD-10-CM | POA: Diagnosis not present

## 2023-06-22 DIAGNOSIS — Z7189 Other specified counseling: Secondary | ICD-10-CM | POA: Diagnosis not present

## 2023-06-22 DIAGNOSIS — Z9181 History of falling: Secondary | ICD-10-CM | POA: Diagnosis not present

## 2023-06-22 DIAGNOSIS — R2689 Other abnormalities of gait and mobility: Secondary | ICD-10-CM | POA: Diagnosis not present

## 2023-06-22 DIAGNOSIS — I5032 Chronic diastolic (congestive) heart failure: Secondary | ICD-10-CM | POA: Diagnosis not present

## 2023-06-22 NOTE — Progress Notes (Unsigned)
 Location:  Other [facility].  Nursing Home Room Number: [facility] [address]A Place of Service:  SNF [phone]) Provider:  Earnestine Mealing, MD  Patient Care Team: Earnestine Mealing, MD as PCP - General (Family Medicine) Antonieta Iba, MD as Consulting Physician (Cardiology) Duke Salvia, MD as Consulting Physician (Cardiology) Bud Face, MD as Referring Physician (Otolaryngology)  Extended Emergency Contact Information Primary Emergency Contact: Koger,[name] Address: [address]          [place], [place] Home Phone: [phone] Mobile Phone: [phone] Relation: Daughter Secondary Emergency Contact: Sallee Provencal [place] Home Phone: [phone] Relation: Daughter  Code Status:  DNR Goals of care: Advanced Directive information    05/24/2023    8:49 AM  Advanced Directives  Does Patient Have a Medical Advance Directive? Yes  Type of Advance Directive Out of facility DNR (pink MOST or yellow form)  Does patient want to make changes to medical advance directive? No - Patient declined     Chief Complaint  Patient presents with   Decline    Decline    HPI:  Pt is a 89 y.o. female seen today for an acute visit for Decline. She says, "I'm ready to go." Patient with continued decline. Spoke with [name]/HCPOA regarding the conversation she had with her mother that she no longer wants interventions. Discussed the possibility of hospice vs comfort measures in facility. At this time patient and family would prefer no additional team members in the room.   Past Medical History:  Diagnosis Date   Atrial fibrillation (HCC)    Depression    GERD (gastroesophageal reflux disease)    Hyperlipidemia    Hypertension    Hypothyroidism    after Rx for thyroid cancer   Mitral regurgitation    Osteoarthritis    Osteoporosis    Pacemaker    Scarlet fever    Sleep disturbance    Thyroid cancer (HCC) 1976   Urinary  incontinence    usually from UTI's   Past Surgical History:  Procedure Laterality Date   ABDOMINAL HYSTERECTOMY  1972   BREAST BIOPSY  04/19/2000   BREAST LUMPECTOMY  04/19/1981   CATARACT EXTRACTION, BILATERAL     EP IMPLANTABLE DEVICE N/A 09/02/2015   Procedure: PPM Generator Changeout;  Surgeon: Marinus Maw, MD;  Location: [facility];  Service: Cardiovascular;  Laterality: N/A;   Goiter Removed  04/19/1974   cancerous. Then got RAI   PACEMAKER INSERTION  08/17/2005   after syncope   TONSILLECTOMY AND ADENOIDECTOMY  04/20/1935   VESICOVAGINAL FISTULA CLOSURE W/ TAH  04/19/1970    Allergies  Allergen Reactions   Amoxicillin Diarrhea    Weakness and atrial fib   Augmentin [Amoxicillin-Pot Clavulanate] Diarrhea    Weakness and atrial fib   Sulfonamide Derivatives     unknown    Outpatient Encounter Medications as of 06/22/2023  Medication Sig   acetaminophen (TYLENOL) 500 MG tablet Take 500 mg by mouth every 8 (eight) hours as needed for mild pain.   albuterol (ACCUNEB) 0.63 MG/3ML nebulizer solution Take 1 ampule by nebulization every 6 (six) hours as needed for wheezing.   calcium carbonate (TUMS - DOSED IN MG ELEMENTAL CALCIUM) 500 MG chewable tablet Chew 2 tablets by mouth daily.   cholecalciferol (VITAMIN D) 1000 UNITS tablet Take 1,000 Units by mouth daily.   ELIQUIS 2.5 MG TABS tablet TAKE 1 TABLET BY MOUTH TWICE  DAILY   furosemide (LASIX) 40 MG  tablet Take 40 mg by mouth daily.   hydrALAZINE (APRESOLINE) 25 MG tablet Take 12.5 mg by mouth 3 (three) times daily.   Lactobacillus (ACIDOPHILUS PO) Take 1 tablet by mouth daily.   levothyroxine (SYNTHROID) 88 MCG tablet TAKE 1 TABLET BY MOUTH DAILY   LORazepam (ATIVAN) 0.5 MG tablet Take 1 tablet (0.5 mg total) by mouth every 8 (eight) hours as needed for anxiety (AGITATION).   Melatonin 5 MG CAPS Take 1 capsule by mouth as needed. " Chewable"   metoprolol tartrate (LOPRESSOR) 25 MG tablet Take 25 mg by mouth 2  (two) times daily.   mirtazapine (REMERON) 30 MG tablet Take 1 tablet (30 mg total) by mouth at bedtime.   nitroGLYCERIN (NITROSTAT) 0.4 MG SL tablet Place 0.4 mg under the tongue every 5 (five) minutes as needed for chest pain (x 3 doses).   oxyCODONE (ROXICODONE) 5 MG immediate release tablet Take 1 tablet (5 mg total) by mouth at bedtime.   oxyCODONE (ROXICODONE) 5 MG immediate release tablet Take 1 tablet (5 mg total) by mouth every 6 (six) hours as needed for severe pain (pain score 7-10).   polyethylene glycol (MIRALAX / GLYCOLAX) 17 g packet Take 17 g by mouth 2 (two) times daily as needed.   trimethoprim (TRIMPEX) 100 MG tablet TAKE 1 TABLET BY MOUTH DAILY   No facility-administered encounter medications on file as of 06/22/2023.    Review of Systems  Immunization History  Administered Date(s) Administered   Fluad Quad(high Dose 65+) 12/26/2018, 02/06/2019, 12/28/2019, 12/31/2020, 01/06/2022   Influenza Split 01/20/2011   Influenza Whole 01/24/2009, 01/21/2010   Influenza, High Dose Seasonal PF 02/14/2018   Influenza,inj,Quad PF,6+ Mos 01/09/2013, 01/15/2014, 02/05/2015, 02/09/2016, 02/02/2017   Influenza-Unspecified 04/25/2023   Moderna Covid-19 Fall Seasonal Vaccine 84yrs & older 07/27/2022   Moderna Sars-Covid-2 Vaccination 05/04/2019, 06/01/2019   Pfizer Covid-19 Vaccine Bivalent Booster 39yrs & up 01/08/2021   Pfizer(Comirnaty)Fall Seasonal Vaccine 12 years and older 02/20/2022   Pneumococcal Conjugate-13 07/16/2013   Pneumococcal Polysaccharide-23 04/19/2000, 08/16/2017   Td 04/19/2008   Tdap 04/07/2021   Varicella 05/16/2023   Pertinent  Health Maintenance Due  Topic Date Due   INFLUENZA VACCINE  Completed   DEXA SCAN  Discontinued      10/20/2022    1:00 PM 02/09/2023    2:03 PM 03/09/2023    3:41 PM 04/19/2023   11:58 AM 06/15/2023    9:42 AM  Fall Risk  Falls in the past year? 1 1 1 1 1   Was there an injury with Fall? 0 0 0 0 1  Fall Risk Category Calculator  2 2 1 1 2   Patient at Risk for Falls Due to History of fall(s);Impaired balance/gait No Fall Risks No Fall Risks  Impaired balance/gait  Fall risk Follow up Falls evaluation completed Falls evaluation completed Falls evaluation completed  Falls evaluation completed   Functional Status Survey:    Vitals:   06/22/23 0945 06/22/23 0951  BP: (!) 166/70 (!) 156/76  Pulse: 60   Resp: 17   Temp: 98.6 F (37 C)   SpO2: 93%   Weight: 107 lb 6.4 oz (48.7 kg)   Height: 5\' 2"  (1.575 m)    Body mass index is 19.64 kg/m. Physical Exam Constitutional:      Comments: Resting in bed  Neurological:     Mental Status: She is alert.     Labs reviewed: Recent Labs    07/14/22 1306 10/28/22 0848 02/07/23 0000 04/28/23 0000  NA  138 135 138 138  K 4.2 4.4 4.2 4.8  CL 101 98 99 101  CO2 31 32 31* 31*  GLUCOSE 91 87  --   --   BUN 36* 34* 44* 33*  CREATININE 1.95* 1.92* 2.1* 1.9*  CALCIUM 9.8 9.9 9.9 9.4  PHOS 3.3  --   --   --    Recent Labs    07/14/22 1306 10/28/22 0848 02/07/23 0000  AST  --  18 20  ALT  --  7 8  ALKPHOS  --   --  70  BILITOT  --  0.7  --   PROT  --  6.8  --   ALBUMIN 4.1  --  4.2   Recent Labs    10/28/22 0848 02/07/23 0000  WBC 4.6 5.5  NEUTROABS 2,875  --   HGB 10.2* 11.2*  HCT 31.0* 34*  MCV 88.1  --   PLT 222 226   Lab Results  Component Value Date   TSH 4.04 03/07/2023   Lab Results  Component Value Date   HGBA1C 6.3 (H) 10/28/2022   Lab Results  Component Value Date   CHOL 391 (H) 06/22/2011   HDL 63.80 06/22/2011   LDLDIRECT 130.6 06/22/2011   TRIG 296.0 (H) 06/22/2011   CHOLHDL 6 06/22/2011    Significant Diagnostic Results in last 30 days:  No results found.  Assessment/Plan Advance directive discussed with patient  Anemia of chronic renal failure, stage 4 (severe) (HCC)  Chronic diastolic CHF (congestive heart failure) (HCC) Patient with continued decline. Does not desire interventions, and is prioritizing pain  control and comfort. Will order comfort medications at this time. Defer hospice, however, comfort measures only.    Family/ staff Communication: nursing, daughter  Labs/tests ordered:  none

## 2023-06-23 ENCOUNTER — Encounter: Payer: Self-pay | Admitting: Student

## 2023-06-23 ENCOUNTER — Other Ambulatory Visit: Payer: Self-pay | Admitting: Nurse Practitioner

## 2023-06-24 ENCOUNTER — Other Ambulatory Visit: Payer: Self-pay | Admitting: Student

## 2023-06-24 ENCOUNTER — Other Ambulatory Visit: Payer: Self-pay | Admitting: Nurse Practitioner

## 2023-06-24 DIAGNOSIS — Z515 Encounter for palliative care: Secondary | ICD-10-CM | POA: Insufficient documentation

## 2023-06-24 DIAGNOSIS — S81811A Laceration without foreign body, right lower leg, initial encounter: Secondary | ICD-10-CM

## 2023-06-24 MED ORDER — MORPHINE SULFATE (CONCENTRATE) 20 MG/ML PO SOLN: 5.0000 mg | ORAL | 0 refills | Status: DC | PRN

## 2023-06-24 MED ORDER — LORAZEPAM 0.5 MG PO TABS: 0.5000 mg | ORAL_TABLET | Freq: Three times a day (TID) | ORAL | 0 refills | Status: DC | PRN

## 2023-06-24 NOTE — Progress Notes (Signed)
 Refill of comfort meds.

## 2023-06-29 ENCOUNTER — Ambulatory Visit: Payer: Medicare Other

## 2023-06-29 DIAGNOSIS — I495 Sick sinus syndrome: Secondary | ICD-10-CM

## 2023-07-19 DEATH — deceased

## 2023-08-16 NOTE — Progress Notes (Signed)
 Remote pacemaker transmission.
